# Patient Record
Sex: Female | Born: 1935 | Race: White | Hispanic: No | Marital: Married | Smoking: Former smoker
Health system: Southern US, Community
[De-identification: ages and names within clinical notes are randomized; demographics above are authoritative.]

## PROBLEM LIST (undated history)

## (undated) DIAGNOSIS — I2781 Cor pulmonale (chronic): Secondary | ICD-10-CM

## (undated) DIAGNOSIS — L97909 Non-pressure chronic ulcer of unspecified part of unspecified lower leg with unspecified severity: Secondary | ICD-10-CM

## (undated) DIAGNOSIS — I10 Essential (primary) hypertension: Secondary | ICD-10-CM

## (undated) DIAGNOSIS — N183 Chronic kidney disease, stage 3 unspecified: Secondary | ICD-10-CM

## (undated) DIAGNOSIS — I482 Chronic atrial fibrillation, unspecified: Secondary | ICD-10-CM

## (undated) DIAGNOSIS — S329XXA Fracture of unspecified parts of lumbosacral spine and pelvis, initial encounter for closed fracture: Secondary | ICD-10-CM

## (undated) DIAGNOSIS — I4821 Permanent atrial fibrillation: Secondary | ICD-10-CM

## (undated) DIAGNOSIS — J9611 Chronic respiratory failure with hypoxia: Secondary | ICD-10-CM

## (undated) DIAGNOSIS — J432 Centrilobular emphysema: Secondary | ICD-10-CM

## (undated) DIAGNOSIS — Z7901 Long term (current) use of anticoagulants: Secondary | ICD-10-CM

## (undated) DIAGNOSIS — C50919 Malignant neoplasm of unspecified site of unspecified female breast: Secondary | ICD-10-CM

## (undated) DIAGNOSIS — I2721 Secondary pulmonary arterial hypertension: Secondary | ICD-10-CM

## (undated) HISTORY — DX: Chronic kidney disease, stage 3 unspecified: N18.30

## (undated) HISTORY — DX: Essential (primary) hypertension: I10

## (undated) HISTORY — PX: ABDOMINAL HYSTERECTOMY: SHX81

## (undated) HISTORY — DX: Long term (current) use of anticoagulants: Z79.01

## (undated) HISTORY — DX: Malignant neoplasm of unspecified site of unspecified female breast: C50.919

## (undated) HISTORY — DX: Fracture of unspecified parts of lumbosacral spine and pelvis, initial encounter for closed fracture: S32.9XXA

## (undated) HISTORY — DX: Chronic atrial fibrillation, unspecified: I48.20

## (undated) HISTORY — PX: BREAST LUMPECTOMY: SHX2

---
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
## 1998-03-25 ENCOUNTER — Other Ambulatory Visit: Admission: RE | Admit: 1998-03-25 | Discharge: 1998-03-25 | Payer: Self-pay | Admitting: Family Medicine

## 1998-03-29 ENCOUNTER — Other Ambulatory Visit: Admission: RE | Admit: 1998-03-29 | Discharge: 1998-03-29 | Payer: Self-pay | Admitting: *Deleted

## 1998-04-09 ENCOUNTER — Encounter: Payer: Self-pay | Admitting: *Deleted

## 1998-04-10 ENCOUNTER — Inpatient Hospital Stay (HOSPITAL_COMMUNITY): Admission: RE | Admit: 1998-04-10 | Discharge: 1998-04-13 | Payer: Self-pay | Admitting: *Deleted

## 1998-09-17 ENCOUNTER — Other Ambulatory Visit: Admission: RE | Admit: 1998-09-17 | Discharge: 1998-09-17 | Payer: Self-pay | Admitting: Family Medicine

## 1999-09-10 ENCOUNTER — Encounter: Admission: RE | Admit: 1999-09-10 | Discharge: 1999-09-10 | Payer: Self-pay | Admitting: Family Medicine

## 1999-09-10 ENCOUNTER — Encounter: Payer: Self-pay | Admitting: Family Medicine

## 1999-12-15 ENCOUNTER — Other Ambulatory Visit: Admission: RE | Admit: 1999-12-15 | Discharge: 1999-12-15 | Payer: Self-pay | Admitting: *Deleted

## 2000-09-14 ENCOUNTER — Encounter: Payer: Self-pay | Admitting: Family Medicine

## 2000-09-14 ENCOUNTER — Encounter: Admission: RE | Admit: 2000-09-14 | Discharge: 2000-09-14 | Payer: Self-pay | Admitting: Family Medicine

## 2001-09-15 ENCOUNTER — Other Ambulatory Visit: Admission: RE | Admit: 2001-09-15 | Discharge: 2001-09-15 | Payer: Self-pay | Admitting: Obstetrics and Gynecology

## 2001-11-24 ENCOUNTER — Encounter: Admission: RE | Admit: 2001-11-24 | Discharge: 2001-11-24 | Payer: Self-pay | Admitting: Internal Medicine

## 2001-11-24 ENCOUNTER — Encounter: Payer: Self-pay | Admitting: Internal Medicine

## 2002-11-08 ENCOUNTER — Encounter: Payer: Self-pay | Admitting: Internal Medicine

## 2002-11-08 ENCOUNTER — Encounter: Admission: RE | Admit: 2002-11-08 | Discharge: 2002-11-08 | Payer: Self-pay | Admitting: Internal Medicine

## 2002-11-10 ENCOUNTER — Encounter: Admission: RE | Admit: 2002-11-10 | Discharge: 2002-11-10 | Payer: Self-pay | Admitting: Internal Medicine

## 2002-11-10 ENCOUNTER — Encounter (INDEPENDENT_AMBULATORY_CARE_PROVIDER_SITE_OTHER): Payer: Self-pay | Admitting: *Deleted

## 2002-11-10 ENCOUNTER — Encounter: Payer: Self-pay | Admitting: Internal Medicine

## 2002-11-16 ENCOUNTER — Encounter (HOSPITAL_COMMUNITY): Admission: RE | Admit: 2002-11-16 | Discharge: 2003-02-14 | Payer: Self-pay | Admitting: Surgery

## 2002-11-16 ENCOUNTER — Encounter: Payer: Self-pay | Admitting: Surgery

## 2002-11-17 ENCOUNTER — Encounter: Payer: Self-pay | Admitting: Surgery

## 2002-11-27 ENCOUNTER — Encounter: Admission: RE | Admit: 2002-11-27 | Discharge: 2002-11-27 | Payer: Self-pay | Admitting: Surgery

## 2002-11-27 ENCOUNTER — Encounter: Payer: Self-pay | Admitting: Surgery

## 2002-11-29 ENCOUNTER — Encounter: Admission: RE | Admit: 2002-11-29 | Discharge: 2002-11-29 | Payer: Self-pay | Admitting: Surgery

## 2002-11-29 ENCOUNTER — Ambulatory Visit (HOSPITAL_COMMUNITY): Admission: RE | Admit: 2002-11-29 | Discharge: 2002-11-29 | Payer: Self-pay | Admitting: Surgery

## 2002-11-29 ENCOUNTER — Encounter: Payer: Self-pay | Admitting: Surgery

## 2002-11-29 ENCOUNTER — Encounter (INDEPENDENT_AMBULATORY_CARE_PROVIDER_SITE_OTHER): Payer: Self-pay | Admitting: Specialist

## 2002-11-29 ENCOUNTER — Ambulatory Visit (HOSPITAL_BASED_OUTPATIENT_CLINIC_OR_DEPARTMENT_OTHER): Admission: RE | Admit: 2002-11-29 | Discharge: 2002-11-29 | Payer: Self-pay | Admitting: Surgery

## 2002-12-15 ENCOUNTER — Ambulatory Visit: Admission: RE | Admit: 2002-12-15 | Discharge: 2003-02-22 | Payer: Self-pay | Admitting: Radiation Oncology

## 2003-03-22 ENCOUNTER — Ambulatory Visit: Admission: RE | Admit: 2003-03-22 | Discharge: 2003-03-22 | Payer: Self-pay | Admitting: Radiation Oncology

## 2003-08-22 ENCOUNTER — Encounter: Admission: RE | Admit: 2003-08-22 | Discharge: 2003-08-22 | Payer: Self-pay | Admitting: Oncology

## 2004-01-02 ENCOUNTER — Ambulatory Visit: Payer: Self-pay | Admitting: Oncology

## 2004-06-24 ENCOUNTER — Ambulatory Visit: Payer: Self-pay | Admitting: Oncology

## 2004-08-25 ENCOUNTER — Encounter: Admission: RE | Admit: 2004-08-25 | Discharge: 2004-08-25 | Payer: Self-pay | Admitting: Oncology

## 2004-12-30 ENCOUNTER — Ambulatory Visit: Payer: Self-pay | Admitting: Oncology

## 2005-05-08 ENCOUNTER — Inpatient Hospital Stay (HOSPITAL_COMMUNITY): Admission: AD | Admit: 2005-05-08 | Discharge: 2005-05-12 | Payer: Self-pay | Admitting: Internal Medicine

## 2005-05-12 ENCOUNTER — Encounter (INDEPENDENT_AMBULATORY_CARE_PROVIDER_SITE_OTHER): Payer: Self-pay | Admitting: Interventional Cardiology

## 2005-08-26 ENCOUNTER — Encounter: Admission: RE | Admit: 2005-08-26 | Discharge: 2005-08-26 | Payer: Self-pay | Admitting: Oncology

## 2005-12-31 ENCOUNTER — Ambulatory Visit: Payer: Self-pay | Admitting: Oncology

## 2006-01-04 LAB — CBC WITH DIFFERENTIAL/PLATELET
BASO%: 2.1 % — ABNORMAL HIGH (ref 0.0–2.0)
Basophils Absolute: 0.1 10*3/uL (ref 0.0–0.1)
HGB: 13.6 g/dL (ref 11.6–15.9)
LYMPH%: 30.7 % (ref 14.0–48.0)
MCH: 34.9 pg — ABNORMAL HIGH (ref 26.0–34.0)
NEUT#: 3.7 10*3/uL (ref 1.5–6.5)
NEUT%: 55 % (ref 39.6–76.8)
lymph#: 2.1 10*3/uL (ref 0.9–3.3)

## 2006-01-04 LAB — COMPREHENSIVE METABOLIC PANEL
ALT: 16 U/L (ref 0–35)
AST: 18 U/L (ref 0–37)
Alkaline Phosphatase: 45 U/L (ref 39–117)
Chloride: 96 mEq/L (ref 96–112)
Creatinine, Ser: 0.74 mg/dL (ref 0.40–1.20)
Sodium: 136 mEq/L (ref 135–145)

## 2006-01-04 LAB — LACTATE DEHYDROGENASE: LDH: 129 U/L (ref 94–250)

## 2006-01-27 ENCOUNTER — Encounter: Admission: RE | Admit: 2006-01-27 | Discharge: 2006-01-27 | Payer: Self-pay | Admitting: Oncology

## 2006-03-09 ENCOUNTER — Encounter: Admission: RE | Admit: 2006-03-09 | Discharge: 2006-03-09 | Payer: Self-pay | Admitting: Oncology

## 2006-07-08 ENCOUNTER — Ambulatory Visit: Payer: Self-pay | Admitting: Oncology

## 2006-07-12 LAB — CBC WITH DIFFERENTIAL/PLATELET
BASO%: 0.7 % (ref 0.0–2.0)
Basophils Absolute: 0.1 10*3/uL (ref 0.0–0.1)
Eosinophils Absolute: 0.2 10*3/uL (ref 0.0–0.5)
HCT: 39.3 % (ref 34.8–46.6)
LYMPH%: 17.3 % (ref 14.0–48.0)
MCHC: 35.6 g/dL (ref 32.0–36.0)
MONO#: 0.8 10*3/uL (ref 0.1–0.9)
MONO%: 9.6 % (ref 0.0–13.0)
NEUT%: 69.9 % (ref 39.6–76.8)
RBC: 3.97 10*6/uL (ref 3.70–5.32)
RDW: 12.8 % (ref 11.3–14.5)
WBC: 8.9 10*3/uL (ref 3.9–10.0)

## 2006-07-12 LAB — COMPREHENSIVE METABOLIC PANEL
AST: 18 U/L (ref 0–37)
Albumin: 4.1 g/dL (ref 3.5–5.2)
Alkaline Phosphatase: 68 U/L (ref 39–117)
BUN: 12 mg/dL (ref 6–23)
Potassium: 3.8 mEq/L (ref 3.5–5.3)

## 2006-07-19 ENCOUNTER — Encounter: Admission: RE | Admit: 2006-07-19 | Discharge: 2006-07-19 | Payer: Self-pay | Admitting: Oncology

## 2007-01-31 ENCOUNTER — Encounter: Admission: RE | Admit: 2007-01-31 | Discharge: 2007-01-31 | Payer: Self-pay | Admitting: Oncology

## 2007-06-29 ENCOUNTER — Ambulatory Visit: Payer: Self-pay | Admitting: Oncology

## 2007-07-01 LAB — CBC WITH DIFFERENTIAL/PLATELET
BASO%: 0.9 % (ref 0.0–2.0)
Basophils Absolute: 0.1 10*3/uL (ref 0.0–0.1)
Eosinophils Absolute: 0.1 10*3/uL (ref 0.0–0.5)
HCT: 39.1 % (ref 34.8–46.6)
LYMPH%: 31.6 % (ref 14.0–48.0)
MONO#: 0.5 10*3/uL (ref 0.1–0.9)
NEUT%: 58.7 % (ref 39.6–76.8)
Platelets: 309 10*3/uL (ref 145–400)
WBC: 6.9 10*3/uL (ref 3.9–10.0)

## 2007-07-01 LAB — COMPREHENSIVE METABOLIC PANEL
AST: 20 U/L (ref 0–37)
Alkaline Phosphatase: 54 U/L (ref 39–117)
BUN: 14 mg/dL (ref 6–23)
Calcium: 9.2 mg/dL (ref 8.4–10.5)
Creatinine, Ser: 0.64 mg/dL (ref 0.40–1.20)
Glucose, Bld: 98 mg/dL (ref 70–99)
Potassium: 3.7 mEq/L (ref 3.5–5.3)

## 2008-02-01 ENCOUNTER — Encounter: Admission: RE | Admit: 2008-02-01 | Discharge: 2008-02-01 | Payer: Self-pay | Admitting: Oncology

## 2008-06-29 ENCOUNTER — Ambulatory Visit: Payer: Self-pay | Admitting: Oncology

## 2009-01-31 ENCOUNTER — Encounter: Admission: RE | Admit: 2009-01-31 | Discharge: 2009-01-31 | Payer: Self-pay | Admitting: Oncology

## 2009-06-28 ENCOUNTER — Ambulatory Visit: Payer: Self-pay | Admitting: Oncology

## 2009-07-01 LAB — CBC WITH DIFFERENTIAL/PLATELET
EOS%: 1.8 % (ref 0.0–7.0)
HCT: 41.9 % (ref 34.8–46.6)
HGB: 14.6 g/dL (ref 11.6–15.9)
MCHC: 34.8 g/dL (ref 31.5–36.0)
MCV: 101.7 fL — ABNORMAL HIGH (ref 79.5–101.0)
MONO#: 0.7 10*3/uL (ref 0.1–0.9)
MONO%: 8.9 % (ref 0.0–14.0)
NEUT%: 65.4 % (ref 38.4–76.8)
Platelets: 337 10*3/uL (ref 145–400)
RBC: 4.12 10*6/uL (ref 3.70–5.45)
RDW: 13.3 % (ref 11.2–14.5)
WBC: 7.9 10*3/uL (ref 3.9–10.3)

## 2009-07-01 LAB — COMPREHENSIVE METABOLIC PANEL
ALT: 11 U/L (ref 0–35)
Albumin: 4.1 g/dL (ref 3.5–5.2)
Potassium: 3.3 mEq/L — ABNORMAL LOW (ref 3.5–5.3)
Sodium: 134 mEq/L — ABNORMAL LOW (ref 135–145)
Total Protein: 7 g/dL (ref 6.0–8.3)

## 2009-07-01 LAB — LACTATE DEHYDROGENASE: LDH: 122 U/L (ref 94–250)

## 2010-02-03 ENCOUNTER — Encounter: Admission: RE | Admit: 2010-02-03 | Discharge: 2010-02-03 | Payer: Self-pay | Admitting: Oncology

## 2010-07-18 NOTE — Op Note (Signed)
[name], [name]                        ACCOUNT NO.:  [id number]   MEDICAL RECORD NO.:  [id number]                   PATIENT TYPE:  AMB   LOCATION:  DSC                                  FACILITY:  [facility]   PHYSICIAN:  Haywood Lasso, M.D.           DATE OF BIRTH:  Nov 08, 1924   DATE OF PROCEDURE:  11/29/2002  DATE OF DISCHARGE:                                 OPERATIVE REPORT   OFFICE MEDICAL RECORD NUMBER:  [id number]   PREOPERATIVE DIAGNOSIS:  Carcinoma left breast, 9 o'clock position.   POSTOPERATIVE DIAGNOSIS:  Carcinoma left breast, 9 o'clock position.   PROCEDURE:  Left partial mastectomy with blue dye injection and sentinel  node dissection.   SURGEON:  Haywood Lasso, M.D.   ANESTHESIA:  General.   INDICATIONS FOR PROCEDURE:  This patient is a 77 year old who recently  presented with a left breast mass.  On core biopsy showed invasive cancer.  It is about the 9 o'clock position.  Difficult to palpate for certain.  When  I saw her in the office to discuss her recent core biopsy. After discussion  of alternatives, she elected to proceed to partial mastectomy with sentinel  node.   DESCRIPTION OF PROCEDURE:  The patient was seen in the holding area and had  no further questions. She had an ultrasound to mark the area in question.  She was taken to the operating room and after satisfactory general  anesthesia had been obtained, I prepped the nipple areolar area with some  alcohol and injected about 4 mL of dilute methylene blue.  The breast was  then prepped and draped.   I approached the partial mastectomy first.  I made an elliptical incision  over where the mass was marked and I felt a little bit low below that, but  almost exactly in the 9 o'clock position, perhaps just a little bit above  the midline, pushing it into the upper inner quadrant.  I did an elliptical  incision around the mass, all the way down to the chest wall including the  fascia.  On  palpating the specimen, I thought I was close on the inferior  edge, somewhat laterally and went ahead and took additional tissue from  there ensuring a negative margin.  This was sent for touch preps.  The area  was draped with Marcaine.  Bleeding was controlled with cautery. Clips were  placed to make the margins of excision and a pack placed.   Attention was turned to the axilla.  Using the Neoprobe, I identified a hot  area and made a transverse incision.  I divided the subcutaneous tissues and  using the Neoprobe, I dissected a little higher up and then identified a  very bright blue node which was somewhat enlarged, perhaps 1.5 cm, but soft.  This was grasped with a Babcock and excised with cautery. A couple of  bleeders were clipped.  Additional node right  next to it was also taken out  which was likewise hot and then a third node was found just beyond these in  the chain that was likewise hot.  Neither of the latter two were blue.   Maximum counts on the first node was 3000.  Once these were out, I checked  and did not see another blue nodes.  I could not palpate any enlarged nodes  and found no hot areas.  Marcaine was injected here and a pack placed.   Attention was turned back to the lumpectomy, to the partial mastectomy site.  It had remained completely dry during the period that we had been working  the axilla, so I went ahead and closed this with 3-0 Vicryl subcu and  staples on the skin. There was a little tension, so I thought that staples  would be better than some absorbable suture.   Attention was turned back to the axilla and this had remained dry.  It was  likewise closed with 3-0 Vicryl and here I used some Monocryl subcuticular  and Dermabond.   The patient tolerated the procedure well.  There were no operative  complications.  Dr. Venora Maples reported that the touch preps on the primary  excision were negative and the three sentinel nodes were negative.                                                Haywood Lasso, M.D.    CJS/MEDQ  D:  11/29/2002  T:  11/29/2002  Job:  [id number]   cc:   [name], M.D.  [address]  [place]  [place] [address]  Fax: [phone]

## 2010-07-18 NOTE — Cardiovascular Report (Signed)
[name], [name] NO.:  [id number]   MEDICAL RECORD NO.:  [id number]          PATIENT TYPE:  INP   LOCATION:  2030                         FACILITY:  [facility]   PHYSICIAN:  Belva Crome, M.D.   DATE OF BIRTH:  06/06/1924   DATE OF PROCEDURE:  05/11/2005  DATE OF DISCHARGE:                              CARDIAC CATHETERIZATION   INDICATIONS FOR PROCEDURE:  Symptoms compatible with progressive angina  pectoris in this 77 year old female with prior history of luminal  irregularities in the LAD.   PROCEDURES PERFORMED:  1.  Left heart catheterization.  2.  Selective coronary angiography.  3.  Left ventriculography.   DESCRIPTION:  After informed consent, a 6-French sheath was placed in the  right femoral artery using the modified Seldinger technique. We then used A2  multipurpose catheter for hemodynamic recordings, left ventriculography by  hand injection, selective right coronary angiography and a #4 6-French left  coronary catheter was used for left coronary angiography. The patient  tolerated the procedure without complications.   After reviewing the pictures in the laboratory and speaking with the  patient, we did percutaneous arteriotomy closure using AngioSeal without  complications. Angiographic visualization of the femoral prior to the  procedure was performed and demonstrated nice smooth endothelium and entry  site appropriate for such procedure. Good hemostasis was achieved. No  complications were noted.   RESULTS:  1.  Hemodynamic data:      1.  Left ventricular pressure 141/9.      2.  Aortic pressure 134/65.  2.  Left ventriculography: LV cavity size and systolic function are normal.      No mitral regurgitation is noted. EF is estimated to be greater than      60%.  3.  Coronary angiography:      1.  Left main coronary: Normal.      2.  Left anterior descending coronary: The LAD is a large vessel that          gives origin to a large  diagonal. The diagonal contains a 60-75%          ostial narrowing. The diagonal is large. The LAD reaches the left          ventricular apex. The LAD is free of any significant obstruction.          The ostial lesion in the diagonal appears to somewhat be related to          a bend in the vessel in the angle of origin. Minimal luminal          irregularities are noted in the LAD in this territory.      3.  Circumflex artery: Circumflex artery is large giving origin to two          obtuse marginals that are normal.      4.  Right coronary: The right coronary artery is dominant. The vessel          contains smooth endothelial surfaces. No obstructive lesions are          noted. Large PDA and large left ventricular  branch noted.   CONCLUSION:  1.  Moderate disease involving the first diagonal with 50-70% narrowing at      the ostium. No other noticeable atherosclerotic disease is present.  2.  Normal left ventricular function.  3.  Successful AngioSeal arteriotomy closure and demonstration of smooth      nonobstructive right femoral/iliac.   PLAN:  1.  Risk factor modification including aggressive lipid management.  2.  Used nitroglycerin for recurrent chest discomfort to see if this is of      help.  3.  Consider noncardiac sources of pain.  4.  Could do PCI on the diagonal with perhaps cutting balloon angioplasty.      This lesion would not be appropriate for stenting as this may enter the      LAD and put it at risk. Would only consider PCI on the diagonal if we      could demonstrate ischemia in the diagonal territory.  5.  Evaluate and treat paroxysmal episodes of atrial fibrillation.      Belva Crome, M.D.  Electronically Signed     HWS/MEDQ  D:  05/11/2005  T:  05/11/2005  Job:  [id number]   cc:   [name], MD  Fax: [phone]   W. Tollie Eth, M.D.  Fax: [phone]  Email: [email or web address]

## 2010-07-18 NOTE — Consult Note (Signed)
NAMEDONA, WALBY NO.:  [id number]   MEDICAL RECORD NO.:  [id number]          PATIENT TYPE:  INP   LOCATION:  2030                         FACILITY:  [facility]   PHYSICIAN:  Ezzard Standing, M.D.DATE OF BIRTH:  11/19/1924   DATE OF CONSULTATION:  05/08/2005  DATE OF DISCHARGE:                                   CONSULTATION   I was asked to see this very nice 77 year old female by Dr. Virgina Jock for  evaluation of prolonged chest discomfort.  The patient previously was  evaluated with chest pain and severe hypertension in December 1995 by the  [facility] group.  At that time, she had a dominant right coronary artery with  minimal irregularity of the proximal ostial LAD and first diagonal.  She had  some possible myocardial bridging in the mid third of her LAD at that time.  She had normal renal arteries at that time.  She had a left bundle branch  block and a negative thallium test at that point and was treated medically.  She later switched care to Dr. Tamala Julian and it has been several years since she  has seen him.  She normally is active and healthy with good blood pressure  control but over the past six months, has had exertional tightness and  heaviness with exertion.  The tightness would occur when she would be on her  walk and would be relieved with rest.  It typically would not occur at rest.  She was at work today at the [employer] and had the onset of throat  burning, upper discomfort in her chest accompanied with some shortness of  breath that lasted around 30 minutes.  She felt light headed, weak, and  pressure at that time and later presented to Dr. Keane Police office.  The  symptoms had resolved and she had a left bundle branch block noted at that  time.  After consultation, she was admitted to the hospital to evaluate for  unstable angina and to plan further therapy.   PAST MEDICAL HISTORY:  Her past history is remarkable for a history of  anxiety and  long-standing hypertension.  She has osteopenia, diverticulosis,  and a previous history of squamous cell carcinoma.  She has had previous  breast cancer treated with lumpectomy and radiation therapy.   PAST SURGICAL HISTORY:  She had a sympathectomy of her ovarian nerves for  uterine cramps at age 75.  She has a previous history of hysterectomy for  uterine cancer and a breast lumpectomy and several operations for skin  cancer.   ALLERGIES:  Codeine.   CURRENT MEDICATIONS:  Meprobamate 400 mg q.h.s., Toprol XL 100 mg daily, and  Micardis/HCT 80/12.5 mg b.i.d., aspirin 81 mg daily, Norvasc 10 mg daily,  vitamin D 400 mg b.i.d., vitamin E 400 mg daily, calcium t.i.d.   SOCIAL HISTORY:  She is married, she has two children living and a son that  died at age 37.  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  She  smoked transiently when she was in college and in her early years, but  has  not smoked in many years.  No significant alcohol use.  Attends [employer].   FAMILY HISTORY:  Remarkable for pancreatic cancer and a history of heart  failure.  Mother had heart disease at greater than age 9.   REVIEW OF SYMPTOMS:  Her weight has been stable.  She has a history of  squamous cell cancers.  She has no glaucoma, cataracts, or macular  degeneration, no nosebleeds or difficulty hearing, no difficulty swallowing  or trouble with her mouth, nose, ears, or throat.  She normally has no  dyspnea on exertion, recent cough, or asthma.  She may have noted some mild  palpitations earlier today.  She complains of some mild lower abdominal  discomfort from time to time.  No genitourinary symptoms.  No history of  kidney stones, no history of stroke or TIA, very mild arthritis.   PHYSICAL EXAMINATION:  GENERAL:  She is a pleasant female in no acute distress appearing stated  age.  VITAL SIGNS:  Blood pressure currently 140/85, pulse 96.  SKIN:  Warm and dry.  HEENT:  EOMI,  PERRLA, fundi not examined, pharynx negative.  NECK:  Supple without masses, thyromegaly, or bruits.  LUNGS:  Clear.  CARDIOVASCULAR:  Shows a somewhat rapid rhythm, no S3 noted.  ABDOMEN:  Soft and nontender.  EXTREMITIES:  Femoral pulses are 2+, distal pulses are 2+, there is no edema  noted.   LABORATORY DATA:  EKG shows a left bundle branch block, it also has the  appearance that it may represent atrial tachycardia or atrial flutter on  initial appearance.  Laboratory work is all pending at the time of  dictation.   IMPRESSION:  1.  Prolonged chest discomfort lasting 30 minutes occurring at rest      consistent with myocardial ischemia.  2.  Left bundle branch block.  3.  Possible atrial arrhythmia consistent with atrial flutter or      tachycardia.  4.  Hypertensive heart disease.  5.  History of breast cancer treated with lumpectomy and radiation therapy.  6.  Diverticulosis.  7.  Left bundle branch block.   RECOMMENDATIONS:  The patient will be placed on Lovenox.  I would increase  her beta blockers and check serial enzymes and lab work on her.  For current  pain, begin IV nitroglycerin and consider Integrilin, continue aspirin.  At  this point, I am concerned that there might be an atrial arrhythmia, also,  and will obtain serial EKGs and watch her on telemetry.  I think to evaluate  her, it would be best to proceed directly to cardiac catheterization to  assess her coronary anatomy and we will arrange for Dr. Tamala Julian to do this on  Monday.   Thank you for asking me to see her with you.      Ezzard Standing, M.D.  Electronically Signed     WST/MEDQ  D:  05/08/2005  T:  05/08/2005  Job:  [id number]   cc:   Belva Crome, M.D.  Fax: [phone]   [name], MD  Fax: [phone]

## 2010-07-18 NOTE — Discharge Summary (Signed)
NAMEDALYA, MASELLI NO.:  [id number]   MEDICAL RECORD NO.:  [id number]          PATIENT TYPE:  INP   LOCATION:  2030                         FACILITY:  [facility]   PHYSICIAN:  [name], MD       DATE OF BIRTH:  07/12/1924   DATE OF ADMISSION:  05/08/2005  DATE OF DISCHARGE:  05/12/2005                                 DISCHARGE SUMMARY   PRIMARY CARE PHYSICIAN:  Dr. Shon Baton   CARDIOLOGIST:  Dr. Pernell Dupre   DISCHARGE DIAGNOSES:  1.  One vessel coronary artery disease with cardiac catheterization on May 11, 2005 revealing ejection fraction 60%, first diagonal with a 65-70%      narrowing.  2.  Anxiety.  3.  History of uterine cancer status post total abdominal hysterectomy.  4.  Osteopenia.  5.  Known left bundle branch block.  6.  Breast cancer status post lumpectomy.  7.  Sympathectomy at 77 years old.  8.  Diverticulosis.  9.  Basal cell carcinoma of the right leg.  10. History of squamous cell carcinoma.  11. Paroxysmal atrial flutter.   DISCHARGE PROCEDURES:  1.  Cardiac catheterization with one vessel coronary artery disease noted.      Ejection fraction 60%.  She had a 50-70% first diagonal lesion.  2.  Chest x-ray without any acute cardiopulmonary disease.  3.  Echocardiogram on March 13 revealing left ventricular ejection fraction      was 65%, no evidence of left ventricular regional wall motion      abnormalities and left atrial size was normal.  4.  Lower extremity ankle brachial indexes which were normal.  5.  EKG which was normal sinus rhythm, left axis deviation, left bundle      branch block.   DISCHARGE MEDICATIONS:  1.  Aspirin 325 mg p.o. daily.  2.  Toprol XL 100 mg p.o. daily.  3.  Norvasc 10 mg p.o. daily.  4.  Micardis/HCT 80/12.5 mg p.o. b.i.d.  5.  Calcium and vitamin D two to three times per day.  6.  Meprobamate 400 mg p.o. q.h.s.  7.  Nitroglycerin 0.4 mg sublingual p.r.n. for chest pain.  8.  K-Dur 20 mEq  p.o. daily.   HISTORY OF PRESENT ILLNESS:  Briefly, Ms. Salem is a 77 year old female who  presented to medical attention at my office on May 08, 2005 with a known  history of 30% LAD lesion from a 1995 heart catheterization, hypertension,  known left bundle branch block who had symptoms of what sounds like  progressive angina.  She describes six months of tightness in the chest that  develops when walking or exerting herself.  It has gradually worsened over  time.  She gets cramps in her legs where resting helps.  Today at rest she  developed chest pressure.  It lasted 30 minutes.  It was associated with  lightheadedness, weakness.  It was not associated with shortness of breath,  radiation, or diaphoresis.  When she presented to my office she was chest  pain-free.  Her symptoms sound like progressing  angina and the March 10  symptoms were very compatible with angina.  A long discussion was had with  Ms. Makela and we will admit her to the hospital, follow serial enzymes, and  progress to cardiac catheterization.  She will probably have to wait until  Monday to get the cardiac catheterization unless her symptoms return or  enzymes are positive.   HOSPITAL COURSE:  Ms. Camisha Srey was with known one-vessel non-  obstructive coronary artery disease.  Was admitted through my office on  May 08, 2005 with symptoms compatible with angina.  Serial enzymes were  obtained and all negative for acute myocardial infarction.  She had an EKG,  placed on telemetry, given aspirin, Lovenox, and the usual medications.  Cardiology was consulted.  Telemetry monitoring did show some atrial flutter  but she did bounce back to normal sinus rhythm.  On May 11, 2005 she  underwent cardiac catheterization with the report above.  Again, she had a  first diagonal lesion of about 60-79% and EF was 60% and it was determined  that she would just need risk factor modification, nitroglycerin for chest  pain,  and an echocardiogram to evaluate for left ventricular size and  function.  It was determined that she would only get percutaneous  intervention if she was having a heart attack or continued symptomatology.  In light of the telemetry revealing that she went in and out of the flutter  once, some of her symptoms might be off and on related to arrhythmia and not  angina.  This can and will be further evaluated by Dr. Tamala Julian as an  outpatient with [facility] or Holter monitoring.  Discussion of Coumadin  will need to occur as an outpatient.   As far as her leg pains are concerned lower extremity arterial Dopplers were  done and they were normal.   For the atrial flutter which reverted back to normal sinus rhythm, again, it  is going to be worked up further as an outpatient.  The 2-D echocardiogram  was otherwise unremarkable and she remained relatively rate controlled.  Heart rate is currently 63.   Her left bundle branch block is old and does not need anything done with it.   Blood pressure remained appropriate to slightly high and can be further  adjusted as an outpatient.   Her anxiety remained stable throughout the hospital course.   Her CBC remained normal.  Her BMET remained normal.  TSH was 1.753.  Total  cholesterol 132, triglycerides 125, HDL 60, LDL was only 47 and this did not  need to be further adjusted.  On May 12, 2005 it was determined that if  she was chest pain-free we were not going to intervene on that one vessel,  that we had a work-up planned per outpatient but we did want to get her  echocardiogram done.  That was done and was normal.  It was okay to  discharge her late in the day on May 12, 2005 with instructions to follow  up with Dr. Pernell Dupre in a day or two in his office to get a Holter monitor  and she will follow up with me in one to two weeks and follow up with Dr.  Tamala Julian as directed and Coumadin outpatient will be discussed.  At the time  of discharge she was in normal sinus rhythm.     [name], MD  Electronically Signed    JMR/MEDQ  D:  07/14/2005  T:  07/14/2005  Job:  [id number]

## 2010-07-18 NOTE — H&P (Signed)
[name], [name] NO.:  [id number]   MEDICAL RECORD NO.:  [id number]          PATIENT TYPE:  INP   LOCATION:  2030                         FACILITY:  [facility]   PHYSICIAN:  [name], MD       DATE OF BIRTH:  Apr 16, 1924   DATE OF ADMISSION:  05/08/2005  DATE OF DISCHARGE:                                HISTORY & PHYSICAL   PRIMARY CARE Anthony Tamburo:  Dr. Shon Baton   CARDIOLOGIST:  Dr. Pernell Dupre   CHIEF COMPLAINT:  Chest pain and angina.   HISTORY OF PRESENT ILLNESS:  This is a 77 year old female with known 30% LAD  lesion from 1995 heart catheterization, hypertension, and known left bundle-  branch block, presents with progressive angina. She describes a 45-month history of tightness in the chest that develops with walking and exerting  herself. It has gradually gotten worse over time and has been much worse  over the last couple of weeks. She also gets cramps in her legs. She reports  that resting helps. Today at rest, the chest pressure developed and lasted  30 minutes with lightheadedness, weakness, and pressure. There was no  shortness of breath, no radiation, no diaphoresis. She was searching for a  aspirin as she felt this might be her heart, was unable to get one. The  patient is now being seen in the office and she is currently chest-pain free  but she will be admitted for evaluation and treatment of angina and have a  cardiac catheterization, probably Monday unless symptoms return or enzymes  are positive.   ALLERGIES:  CODEINE.   MEDICATIONS:  1.  Meprobamate 400 q.h.s.  2.  Toprol-XL 100 daily.  3.  Micardis HCT 80/12.5 b.i.d.  4.  Aspirin 81 daily.  5.  Norvasc 10 daily.  6.  Vitamin D 400 b.i.d.  7.  Vitamin E 400 daily.  8.  Calcium 500 t.i.d.   PAST MEDICAL HISTORY:  1.  Cardiac catheterization in December 1995 with a 30% LAD lesion.  2.  Anxiety.  3.  History of uterine cancer status post total abdominal hysterectomy.  4.   Osteopenia.  5.  Left bundle-branch block.  6.  Breast cancer status post lumpectomy.  7.  Sympathectomy at 77years old.  8.  Diverticulosis.  9.  Basal cell carcinoma of the right leg.  10. History of squamous cell carcinoma.   SOCIAL HISTORY:  She is married, three children. She is retired. She drinks  wine.   FAMILY HISTORY:  Longevity, pancreatic cancer, congestive heart failure.   REVIEW OF SYSTEMS:  She denies any abdominal or GI issues. She denies any  ear, nose, and throat symptoms. She denies any other issues except the  angina as described in the history of present illness. She is currently  chest-pain free and comfortable in the office.   PHYSICAL EXAMINATION:  VITAL SIGNS:  Heart rate 100, respiratory rate 18,  blood pressure 138/80, weight is 145 pounds.  GENERAL:  She is alert and oriented x3.  PULMONARY:  Clear to auscultation bilaterally.  CARDIAC:  Regular.  ABDOMEN:  Soft.  EXTREMITIES:  No clubbing, no cyanosis, no edema.  HEENT:  Oropharynx is moist.  NECK:  No JVD, no lymphadenopathy.   ANCILLARY DATA:  EKG:  Left bundle-branch block on EKG.   ASSESSMENT:  This is a 77 year old female with history of hypertension and  left bundle-branch block and a 30% LAD lesion from a cardiac catheterization  in 1995 who is presenting with worsening angina and had angina at rest  today. She is currently chest-pain free.   PLAN:  1.  Admit.  2.  Lovenox 1 mg/kg subcu b.i.d.  3.  Follow serial enzymes.  4.  Nitroglycerin as needed. Since she is not currently having chest pain      will not do nitroglycerin drip.  5.  Cardiology to see the patient tonight. Will get Dr. Wynonia Lawman involved. I      have already talked to him on the phone. He is on call for Dr. Tamala Julian.  6.  Place her on a telemetry bed.  7.  Control blood pressure.  8.  With my discussion with Dr. Wynonia Lawman it looks like she will be probably      progressing towards a heart catheterization for Monday unless she  does      rule in for myocardial infarction or develops unstable angina this      weekend.  9.  Lorazepam for nerves.  10. Will discontinue the vitamin E.  11. Continue home medications.      [name], MD  Electronically Signed     JMR/MEDQ  D:  05/08/2005  T:  05/09/2005  Job:  [id number]   cc:   Belva Crome, M.D.  Fax: [phone]

## 2011-01-02 ENCOUNTER — Other Ambulatory Visit: Payer: Self-pay | Admitting: Internal Medicine

## 2011-01-02 DIAGNOSIS — Z1231 Encounter for screening mammogram for malignant neoplasm of breast: Secondary | ICD-10-CM

## 2011-01-13 ENCOUNTER — Other Ambulatory Visit: Payer: Self-pay | Admitting: Family Medicine

## 2011-02-05 ENCOUNTER — Ambulatory Visit
Admission: RE | Admit: 2011-02-05 | Discharge: 2011-02-05 | Disposition: A | Discharge status: Home / Self Care | Payer: Medicare Other | Source: Ambulatory Visit | Attending: Internal Medicine | Admitting: Internal Medicine

## 2011-02-05 DIAGNOSIS — Z1231 Encounter for screening mammogram for malignant neoplasm of breast: Secondary | ICD-10-CM

## 2011-03-17 ENCOUNTER — Other Ambulatory Visit: Payer: Self-pay

## 2011-03-17 DIAGNOSIS — H11009 Unspecified pterygium of unspecified eye: Secondary | ICD-10-CM | POA: Diagnosis not present

## 2011-03-17 DIAGNOSIS — H11069 Recurrent pterygium of unspecified eye: Secondary | ICD-10-CM | POA: Diagnosis not present

## 2011-03-25 DIAGNOSIS — M949 Disorder of cartilage, unspecified: Secondary | ICD-10-CM | POA: Diagnosis not present

## 2011-03-25 DIAGNOSIS — Z7901 Long term (current) use of anticoagulants: Secondary | ICD-10-CM | POA: Diagnosis not present

## 2011-03-25 DIAGNOSIS — I4891 Unspecified atrial fibrillation: Secondary | ICD-10-CM | POA: Diagnosis not present

## 2011-03-25 DIAGNOSIS — I10 Essential (primary) hypertension: Secondary | ICD-10-CM | POA: Diagnosis not present

## 2011-03-25 DIAGNOSIS — M899 Disorder of bone, unspecified: Secondary | ICD-10-CM | POA: Diagnosis not present

## 2011-03-25 DIAGNOSIS — R5381 Other malaise: Secondary | ICD-10-CM | POA: Diagnosis not present

## 2011-04-01 DIAGNOSIS — R5383 Other fatigue: Secondary | ICD-10-CM | POA: Diagnosis not present

## 2011-04-01 DIAGNOSIS — I10 Essential (primary) hypertension: Secondary | ICD-10-CM | POA: Diagnosis not present

## 2011-04-01 DIAGNOSIS — Z0000 Encounter for general adult medical examination without abnormal findings: Secondary | ICD-10-CM | POA: Diagnosis not present

## 2011-04-01 DIAGNOSIS — C50919 Malignant neoplasm of unspecified site of unspecified female breast: Secondary | ICD-10-CM | POA: Diagnosis not present

## 2011-04-01 DIAGNOSIS — R5381 Other malaise: Secondary | ICD-10-CM | POA: Diagnosis not present

## 2011-04-07 DIAGNOSIS — R82998 Other abnormal findings in urine: Secondary | ICD-10-CM | POA: Diagnosis not present

## 2011-05-05 DIAGNOSIS — Z7901 Long term (current) use of anticoagulants: Secondary | ICD-10-CM | POA: Diagnosis not present

## 2011-05-05 DIAGNOSIS — I4891 Unspecified atrial fibrillation: Secondary | ICD-10-CM | POA: Diagnosis not present

## 2011-05-08 DIAGNOSIS — Z85828 Personal history of other malignant neoplasm of skin: Secondary | ICD-10-CM | POA: Diagnosis not present

## 2011-05-08 DIAGNOSIS — B079 Viral wart, unspecified: Secondary | ICD-10-CM | POA: Diagnosis not present

## 2011-05-08 DIAGNOSIS — L578 Other skin changes due to chronic exposure to nonionizing radiation: Secondary | ICD-10-CM | POA: Diagnosis not present

## 2011-05-08 DIAGNOSIS — L570 Actinic keratosis: Secondary | ICD-10-CM | POA: Diagnosis not present

## 2011-06-03 DIAGNOSIS — M899 Disorder of bone, unspecified: Secondary | ICD-10-CM | POA: Diagnosis not present

## 2011-06-03 DIAGNOSIS — M949 Disorder of cartilage, unspecified: Secondary | ICD-10-CM | POA: Diagnosis not present

## 2011-06-03 DIAGNOSIS — Z7901 Long term (current) use of anticoagulants: Secondary | ICD-10-CM | POA: Diagnosis not present

## 2011-06-03 DIAGNOSIS — I4891 Unspecified atrial fibrillation: Secondary | ICD-10-CM | POA: Diagnosis not present

## 2011-06-17 DIAGNOSIS — H11009 Unspecified pterygium of unspecified eye: Secondary | ICD-10-CM | POA: Diagnosis not present

## 2011-06-17 DIAGNOSIS — H179 Unspecified corneal scar and opacity: Secondary | ICD-10-CM | POA: Diagnosis not present

## 2011-06-17 DIAGNOSIS — Z961 Presence of intraocular lens: Secondary | ICD-10-CM | POA: Diagnosis not present

## 2011-07-01 DIAGNOSIS — M899 Disorder of bone, unspecified: Secondary | ICD-10-CM | POA: Diagnosis not present

## 2011-07-01 DIAGNOSIS — Z7901 Long term (current) use of anticoagulants: Secondary | ICD-10-CM | POA: Diagnosis not present

## 2011-07-01 DIAGNOSIS — M949 Disorder of cartilage, unspecified: Secondary | ICD-10-CM | POA: Diagnosis not present

## 2011-07-01 DIAGNOSIS — I4891 Unspecified atrial fibrillation: Secondary | ICD-10-CM | POA: Diagnosis not present

## 2011-08-12 DIAGNOSIS — Z7901 Long term (current) use of anticoagulants: Secondary | ICD-10-CM | POA: Diagnosis not present

## 2011-08-12 DIAGNOSIS — I4891 Unspecified atrial fibrillation: Secondary | ICD-10-CM | POA: Diagnosis not present

## 2011-08-17 DIAGNOSIS — H11009 Unspecified pterygium of unspecified eye: Secondary | ICD-10-CM | POA: Diagnosis not present

## 2011-08-17 DIAGNOSIS — Z961 Presence of intraocular lens: Secondary | ICD-10-CM | POA: Diagnosis not present

## 2011-08-17 DIAGNOSIS — H179 Unspecified corneal scar and opacity: Secondary | ICD-10-CM | POA: Diagnosis not present

## 2011-09-14 DIAGNOSIS — Z7901 Long term (current) use of anticoagulants: Secondary | ICD-10-CM | POA: Diagnosis not present

## 2011-09-14 DIAGNOSIS — I4891 Unspecified atrial fibrillation: Secondary | ICD-10-CM | POA: Diagnosis not present

## 2011-09-16 DIAGNOSIS — I10 Essential (primary) hypertension: Secondary | ICD-10-CM | POA: Diagnosis not present

## 2011-09-16 DIAGNOSIS — R0602 Shortness of breath: Secondary | ICD-10-CM | POA: Diagnosis not present

## 2011-09-16 DIAGNOSIS — I4891 Unspecified atrial fibrillation: Secondary | ICD-10-CM | POA: Diagnosis not present

## 2011-09-16 DIAGNOSIS — I2510 Atherosclerotic heart disease of native coronary artery without angina pectoris: Secondary | ICD-10-CM | POA: Diagnosis not present

## 2011-09-16 DIAGNOSIS — R5382 Chronic fatigue, unspecified: Secondary | ICD-10-CM | POA: Diagnosis not present

## 2011-09-30 DIAGNOSIS — R809 Proteinuria, unspecified: Secondary | ICD-10-CM | POA: Diagnosis not present

## 2011-09-30 DIAGNOSIS — I4891 Unspecified atrial fibrillation: Secondary | ICD-10-CM | POA: Diagnosis not present

## 2011-09-30 DIAGNOSIS — R0602 Shortness of breath: Secondary | ICD-10-CM | POA: Diagnosis not present

## 2011-09-30 DIAGNOSIS — R0989 Other specified symptoms and signs involving the circulatory and respiratory systems: Secondary | ICD-10-CM | POA: Diagnosis not present

## 2011-09-30 DIAGNOSIS — Z79899 Other long term (current) drug therapy: Secondary | ICD-10-CM | POA: Diagnosis not present

## 2011-09-30 DIAGNOSIS — I2510 Atherosclerotic heart disease of native coronary artery without angina pectoris: Secondary | ICD-10-CM | POA: Diagnosis not present

## 2011-09-30 DIAGNOSIS — R0609 Other forms of dyspnea: Secondary | ICD-10-CM | POA: Diagnosis not present

## 2011-09-30 DIAGNOSIS — I10 Essential (primary) hypertension: Secondary | ICD-10-CM | POA: Diagnosis not present

## 2011-09-30 DIAGNOSIS — R5382 Chronic fatigue, unspecified: Secondary | ICD-10-CM | POA: Diagnosis not present

## 2011-09-30 DIAGNOSIS — Z7901 Long term (current) use of anticoagulants: Secondary | ICD-10-CM | POA: Diagnosis not present

## 2011-10-02 DIAGNOSIS — R809 Proteinuria, unspecified: Secondary | ICD-10-CM | POA: Diagnosis not present

## 2011-10-06 DIAGNOSIS — R809 Proteinuria, unspecified: Secondary | ICD-10-CM | POA: Diagnosis not present

## 2011-10-20 DIAGNOSIS — I4891 Unspecified atrial fibrillation: Secondary | ICD-10-CM | POA: Diagnosis not present

## 2011-10-20 DIAGNOSIS — Z7901 Long term (current) use of anticoagulants: Secondary | ICD-10-CM | POA: Diagnosis not present

## 2011-11-03 DIAGNOSIS — Z79899 Other long term (current) drug therapy: Secondary | ICD-10-CM | POA: Diagnosis not present

## 2011-11-17 DIAGNOSIS — D1801 Hemangioma of skin and subcutaneous tissue: Secondary | ICD-10-CM | POA: Diagnosis not present

## 2011-11-17 DIAGNOSIS — L570 Actinic keratosis: Secondary | ICD-10-CM | POA: Diagnosis not present

## 2011-11-17 DIAGNOSIS — L259 Unspecified contact dermatitis, unspecified cause: Secondary | ICD-10-CM | POA: Diagnosis not present

## 2011-11-17 DIAGNOSIS — L578 Other skin changes due to chronic exposure to nonionizing radiation: Secondary | ICD-10-CM | POA: Diagnosis not present

## 2011-11-17 DIAGNOSIS — L820 Inflamed seborrheic keratosis: Secondary | ICD-10-CM | POA: Diagnosis not present

## 2011-11-17 DIAGNOSIS — L219 Seborrheic dermatitis, unspecified: Secondary | ICD-10-CM | POA: Diagnosis not present

## 2011-11-24 DIAGNOSIS — Z23 Encounter for immunization: Secondary | ICD-10-CM | POA: Diagnosis not present

## 2011-11-24 DIAGNOSIS — I4891 Unspecified atrial fibrillation: Secondary | ICD-10-CM | POA: Diagnosis not present

## 2011-11-24 DIAGNOSIS — Z7901 Long term (current) use of anticoagulants: Secondary | ICD-10-CM | POA: Diagnosis not present

## 2011-12-08 DIAGNOSIS — H1130 Conjunctival hemorrhage, unspecified eye: Secondary | ICD-10-CM | POA: Diagnosis not present

## 2011-12-23 DIAGNOSIS — Z7901 Long term (current) use of anticoagulants: Secondary | ICD-10-CM | POA: Diagnosis not present

## 2011-12-23 DIAGNOSIS — I4891 Unspecified atrial fibrillation: Secondary | ICD-10-CM | POA: Diagnosis not present

## 2012-01-06 DIAGNOSIS — Z7901 Long term (current) use of anticoagulants: Secondary | ICD-10-CM | POA: Diagnosis not present

## 2012-01-06 DIAGNOSIS — I4891 Unspecified atrial fibrillation: Secondary | ICD-10-CM | POA: Diagnosis not present

## 2012-01-20 DIAGNOSIS — Z7901 Long term (current) use of anticoagulants: Secondary | ICD-10-CM | POA: Diagnosis not present

## 2012-01-20 DIAGNOSIS — I10 Essential (primary) hypertension: Secondary | ICD-10-CM | POA: Diagnosis not present

## 2012-01-20 DIAGNOSIS — I4891 Unspecified atrial fibrillation: Secondary | ICD-10-CM | POA: Diagnosis not present

## 2012-01-20 DIAGNOSIS — I2510 Atherosclerotic heart disease of native coronary artery without angina pectoris: Secondary | ICD-10-CM | POA: Diagnosis not present

## 2012-02-10 DIAGNOSIS — I4891 Unspecified atrial fibrillation: Secondary | ICD-10-CM | POA: Diagnosis not present

## 2012-02-10 DIAGNOSIS — Z7901 Long term (current) use of anticoagulants: Secondary | ICD-10-CM | POA: Diagnosis not present

## 2012-02-16 DIAGNOSIS — C44621 Squamous cell carcinoma of skin of unspecified upper limb, including shoulder: Secondary | ICD-10-CM | POA: Diagnosis not present

## 2012-02-16 DIAGNOSIS — D0470 Carcinoma in situ of skin of unspecified lower limb, including hip: Secondary | ICD-10-CM | POA: Diagnosis not present

## 2012-02-16 DIAGNOSIS — D485 Neoplasm of uncertain behavior of skin: Secondary | ICD-10-CM | POA: Diagnosis not present

## 2012-02-16 DIAGNOSIS — L578 Other skin changes due to chronic exposure to nonionizing radiation: Secondary | ICD-10-CM | POA: Diagnosis not present

## 2012-03-09 DIAGNOSIS — I4891 Unspecified atrial fibrillation: Secondary | ICD-10-CM | POA: Diagnosis not present

## 2012-03-09 DIAGNOSIS — Z7901 Long term (current) use of anticoagulants: Secondary | ICD-10-CM | POA: Diagnosis not present

## 2012-03-22 DIAGNOSIS — C4492 Squamous cell carcinoma of skin, unspecified: Secondary | ICD-10-CM | POA: Diagnosis not present

## 2012-03-28 DIAGNOSIS — L578 Other skin changes due to chronic exposure to nonionizing radiation: Secondary | ICD-10-CM | POA: Diagnosis not present

## 2012-03-28 DIAGNOSIS — L089 Local infection of the skin and subcutaneous tissue, unspecified: Secondary | ICD-10-CM | POA: Diagnosis not present

## 2012-03-28 DIAGNOSIS — L570 Actinic keratosis: Secondary | ICD-10-CM | POA: Diagnosis not present

## 2012-03-28 DIAGNOSIS — Z85828 Personal history of other malignant neoplasm of skin: Secondary | ICD-10-CM | POA: Diagnosis not present

## 2012-03-29 DIAGNOSIS — D539 Nutritional anemia, unspecified: Secondary | ICD-10-CM | POA: Diagnosis not present

## 2012-03-29 DIAGNOSIS — R82998 Other abnormal findings in urine: Secondary | ICD-10-CM | POA: Diagnosis not present

## 2012-03-29 DIAGNOSIS — I10 Essential (primary) hypertension: Secondary | ICD-10-CM | POA: Diagnosis not present

## 2012-03-29 DIAGNOSIS — I2510 Atherosclerotic heart disease of native coronary artery without angina pectoris: Secondary | ICD-10-CM | POA: Diagnosis not present

## 2012-03-29 DIAGNOSIS — M899 Disorder of bone, unspecified: Secondary | ICD-10-CM | POA: Diagnosis not present

## 2012-03-29 DIAGNOSIS — M949 Disorder of cartilage, unspecified: Secondary | ICD-10-CM | POA: Diagnosis not present

## 2012-04-04 DIAGNOSIS — R7309 Other abnormal glucose: Secondary | ICD-10-CM | POA: Diagnosis not present

## 2012-04-04 DIAGNOSIS — Z0000 Encounter for general adult medical examination without abnormal findings: Secondary | ICD-10-CM | POA: Diagnosis not present

## 2012-04-04 DIAGNOSIS — I2510 Atherosclerotic heart disease of native coronary artery without angina pectoris: Secondary | ICD-10-CM | POA: Diagnosis not present

## 2012-04-04 DIAGNOSIS — R0989 Other specified symptoms and signs involving the circulatory and respiratory systems: Secondary | ICD-10-CM | POA: Diagnosis not present

## 2012-04-06 ENCOUNTER — Other Ambulatory Visit: Payer: Self-pay | Admitting: Internal Medicine

## 2012-04-06 DIAGNOSIS — Z9889 Other specified postprocedural states: Secondary | ICD-10-CM

## 2012-04-06 DIAGNOSIS — Z853 Personal history of malignant neoplasm of breast: Secondary | ICD-10-CM

## 2012-04-06 DIAGNOSIS — Z1231 Encounter for screening mammogram for malignant neoplasm of breast: Secondary | ICD-10-CM

## 2012-04-12 DIAGNOSIS — Z7901 Long term (current) use of anticoagulants: Secondary | ICD-10-CM | POA: Diagnosis not present

## 2012-04-12 DIAGNOSIS — I4891 Unspecified atrial fibrillation: Secondary | ICD-10-CM | POA: Diagnosis not present

## 2012-04-15 ENCOUNTER — Ambulatory Visit: Payer: Medicare Other

## 2012-05-11 ENCOUNTER — Ambulatory Visit
Admission: RE | Admit: 2012-05-11 | Discharge: 2012-05-11 | Disposition: A | Discharge status: Home / Self Care | Payer: Medicare Other | Source: Ambulatory Visit | Attending: Internal Medicine | Admitting: Internal Medicine

## 2012-05-11 DIAGNOSIS — Z853 Personal history of malignant neoplasm of breast: Secondary | ICD-10-CM

## 2012-05-11 DIAGNOSIS — I10 Essential (primary) hypertension: Secondary | ICD-10-CM | POA: Diagnosis not present

## 2012-05-11 DIAGNOSIS — Z7901 Long term (current) use of anticoagulants: Secondary | ICD-10-CM | POA: Diagnosis not present

## 2012-05-11 DIAGNOSIS — Z1231 Encounter for screening mammogram for malignant neoplasm of breast: Secondary | ICD-10-CM | POA: Diagnosis not present

## 2012-05-11 DIAGNOSIS — I4891 Unspecified atrial fibrillation: Secondary | ICD-10-CM | POA: Diagnosis not present

## 2012-05-11 DIAGNOSIS — Z9889 Other specified postprocedural states: Secondary | ICD-10-CM

## 2012-06-14 DIAGNOSIS — I4891 Unspecified atrial fibrillation: Secondary | ICD-10-CM | POA: Diagnosis not present

## 2012-06-14 DIAGNOSIS — Z7901 Long term (current) use of anticoagulants: Secondary | ICD-10-CM | POA: Diagnosis not present

## 2012-07-14 DIAGNOSIS — Z7901 Long term (current) use of anticoagulants: Secondary | ICD-10-CM | POA: Diagnosis not present

## 2012-07-14 DIAGNOSIS — I4891 Unspecified atrial fibrillation: Secondary | ICD-10-CM | POA: Diagnosis not present

## 2012-07-20 DIAGNOSIS — Z7901 Long term (current) use of anticoagulants: Secondary | ICD-10-CM | POA: Diagnosis not present

## 2012-07-20 DIAGNOSIS — I4891 Unspecified atrial fibrillation: Secondary | ICD-10-CM | POA: Diagnosis not present

## 2012-07-20 DIAGNOSIS — I5032 Chronic diastolic (congestive) heart failure: Secondary | ICD-10-CM | POA: Diagnosis not present

## 2012-07-20 DIAGNOSIS — I10 Essential (primary) hypertension: Secondary | ICD-10-CM | POA: Diagnosis not present

## 2012-07-22 ENCOUNTER — Other Ambulatory Visit: Payer: Self-pay | Admitting: Dermatology

## 2012-07-22 DIAGNOSIS — L570 Actinic keratosis: Secondary | ICD-10-CM | POA: Diagnosis not present

## 2012-07-22 DIAGNOSIS — L820 Inflamed seborrheic keratosis: Secondary | ICD-10-CM | POA: Diagnosis not present

## 2012-07-22 DIAGNOSIS — L578 Other skin changes due to chronic exposure to nonionizing radiation: Secondary | ICD-10-CM | POA: Diagnosis not present

## 2012-07-22 DIAGNOSIS — Z85828 Personal history of other malignant neoplasm of skin: Secondary | ICD-10-CM | POA: Diagnosis not present

## 2012-07-22 DIAGNOSIS — D485 Neoplasm of uncertain behavior of skin: Secondary | ICD-10-CM | POA: Diagnosis not present

## 2012-08-16 DIAGNOSIS — I4891 Unspecified atrial fibrillation: Secondary | ICD-10-CM | POA: Diagnosis not present

## 2012-08-16 DIAGNOSIS — Z7901 Long term (current) use of anticoagulants: Secondary | ICD-10-CM | POA: Diagnosis not present

## 2012-09-12 DIAGNOSIS — H11069 Recurrent pterygium of unspecified eye: Secondary | ICD-10-CM | POA: Diagnosis not present

## 2012-09-12 DIAGNOSIS — Z961 Presence of intraocular lens: Secondary | ICD-10-CM | POA: Diagnosis not present

## 2012-09-15 DIAGNOSIS — Z7901 Long term (current) use of anticoagulants: Secondary | ICD-10-CM | POA: Diagnosis not present

## 2012-09-15 DIAGNOSIS — I4891 Unspecified atrial fibrillation: Secondary | ICD-10-CM | POA: Diagnosis not present

## 2012-09-26 DIAGNOSIS — K219 Gastro-esophageal reflux disease without esophagitis: Secondary | ICD-10-CM | POA: Diagnosis not present

## 2012-09-26 DIAGNOSIS — R7309 Other abnormal glucose: Secondary | ICD-10-CM | POA: Diagnosis not present

## 2012-09-26 DIAGNOSIS — I4891 Unspecified atrial fibrillation: Secondary | ICD-10-CM | POA: Diagnosis not present

## 2012-09-26 DIAGNOSIS — I10 Essential (primary) hypertension: Secondary | ICD-10-CM | POA: Diagnosis not present

## 2012-09-26 DIAGNOSIS — I2510 Atherosclerotic heart disease of native coronary artery without angina pectoris: Secondary | ICD-10-CM | POA: Diagnosis not present

## 2012-09-26 DIAGNOSIS — Z7901 Long term (current) use of anticoagulants: Secondary | ICD-10-CM | POA: Diagnosis not present

## 2012-09-26 DIAGNOSIS — Z6825 Body mass index (BMI) 25.0-25.9, adult: Secondary | ICD-10-CM | POA: Diagnosis not present

## 2012-10-20 DIAGNOSIS — Z7901 Long term (current) use of anticoagulants: Secondary | ICD-10-CM | POA: Diagnosis not present

## 2012-10-20 DIAGNOSIS — I4891 Unspecified atrial fibrillation: Secondary | ICD-10-CM | POA: Diagnosis not present

## 2012-10-21 ENCOUNTER — Other Ambulatory Visit: Payer: Self-pay | Admitting: Dermatology

## 2012-10-21 DIAGNOSIS — L570 Actinic keratosis: Secondary | ICD-10-CM | POA: Diagnosis not present

## 2012-10-21 DIAGNOSIS — L820 Inflamed seborrheic keratosis: Secondary | ICD-10-CM | POA: Diagnosis not present

## 2012-10-21 DIAGNOSIS — D1801 Hemangioma of skin and subcutaneous tissue: Secondary | ICD-10-CM | POA: Diagnosis not present

## 2012-10-21 DIAGNOSIS — D0470 Carcinoma in situ of skin of unspecified lower limb, including hip: Secondary | ICD-10-CM | POA: Diagnosis not present

## 2012-10-21 DIAGNOSIS — D0439 Carcinoma in situ of skin of other parts of face: Secondary | ICD-10-CM | POA: Diagnosis not present

## 2012-10-21 DIAGNOSIS — Z85828 Personal history of other malignant neoplasm of skin: Secondary | ICD-10-CM | POA: Diagnosis not present

## 2012-11-24 DIAGNOSIS — Z23 Encounter for immunization: Secondary | ICD-10-CM | POA: Diagnosis not present

## 2012-11-24 DIAGNOSIS — I4891 Unspecified atrial fibrillation: Secondary | ICD-10-CM | POA: Diagnosis not present

## 2012-11-24 DIAGNOSIS — Z7901 Long term (current) use of anticoagulants: Secondary | ICD-10-CM | POA: Diagnosis not present

## 2012-12-27 DIAGNOSIS — Z7901 Long term (current) use of anticoagulants: Secondary | ICD-10-CM | POA: Diagnosis not present

## 2012-12-27 DIAGNOSIS — I4891 Unspecified atrial fibrillation: Secondary | ICD-10-CM | POA: Diagnosis not present

## 2013-01-24 DIAGNOSIS — Z7901 Long term (current) use of anticoagulants: Secondary | ICD-10-CM | POA: Diagnosis not present

## 2013-01-24 DIAGNOSIS — I4891 Unspecified atrial fibrillation: Secondary | ICD-10-CM | POA: Diagnosis not present

## 2013-02-21 DIAGNOSIS — Z7901 Long term (current) use of anticoagulants: Secondary | ICD-10-CM | POA: Diagnosis not present

## 2013-02-21 DIAGNOSIS — I4891 Unspecified atrial fibrillation: Secondary | ICD-10-CM | POA: Diagnosis not present

## 2013-03-03 DIAGNOSIS — R509 Fever, unspecified: Secondary | ICD-10-CM | POA: Diagnosis not present

## 2013-03-31 DIAGNOSIS — I2510 Atherosclerotic heart disease of native coronary artery without angina pectoris: Secondary | ICD-10-CM | POA: Diagnosis not present

## 2013-03-31 DIAGNOSIS — I10 Essential (primary) hypertension: Secondary | ICD-10-CM | POA: Diagnosis not present

## 2013-03-31 DIAGNOSIS — Z7901 Long term (current) use of anticoagulants: Secondary | ICD-10-CM | POA: Diagnosis not present

## 2013-03-31 DIAGNOSIS — I4891 Unspecified atrial fibrillation: Secondary | ICD-10-CM | POA: Diagnosis not present

## 2013-03-31 DIAGNOSIS — M949 Disorder of cartilage, unspecified: Secondary | ICD-10-CM | POA: Diagnosis not present

## 2013-03-31 DIAGNOSIS — D539 Nutritional anemia, unspecified: Secondary | ICD-10-CM | POA: Diagnosis not present

## 2013-03-31 DIAGNOSIS — M899 Disorder of bone, unspecified: Secondary | ICD-10-CM | POA: Diagnosis not present

## 2013-04-03 DIAGNOSIS — R7309 Other abnormal glucose: Secondary | ICD-10-CM | POA: Diagnosis not present

## 2013-04-07 DIAGNOSIS — R0989 Other specified symptoms and signs involving the circulatory and respiratory systems: Secondary | ICD-10-CM | POA: Diagnosis not present

## 2013-04-07 DIAGNOSIS — Z0000 Encounter for general adult medical examination without abnormal findings: Secondary | ICD-10-CM | POA: Diagnosis not present

## 2013-04-07 DIAGNOSIS — C50919 Malignant neoplasm of unspecified site of unspecified female breast: Secondary | ICD-10-CM | POA: Diagnosis not present

## 2013-04-07 DIAGNOSIS — I10 Essential (primary) hypertension: Secondary | ICD-10-CM | POA: Diagnosis not present

## 2013-04-07 DIAGNOSIS — I4891 Unspecified atrial fibrillation: Secondary | ICD-10-CM | POA: Diagnosis not present

## 2013-04-07 DIAGNOSIS — R809 Proteinuria, unspecified: Secondary | ICD-10-CM | POA: Diagnosis not present

## 2013-04-07 DIAGNOSIS — R7309 Other abnormal glucose: Secondary | ICD-10-CM | POA: Diagnosis not present

## 2013-04-07 DIAGNOSIS — Z1331 Encounter for screening for depression: Secondary | ICD-10-CM | POA: Diagnosis not present

## 2013-04-07 DIAGNOSIS — R0609 Other forms of dyspnea: Secondary | ICD-10-CM | POA: Diagnosis not present

## 2013-04-07 DIAGNOSIS — Z7901 Long term (current) use of anticoagulants: Secondary | ICD-10-CM | POA: Diagnosis not present

## 2013-04-12 DIAGNOSIS — Z1212 Encounter for screening for malignant neoplasm of rectum: Secondary | ICD-10-CM | POA: Diagnosis not present

## 2013-05-04 DIAGNOSIS — Z7901 Long term (current) use of anticoagulants: Secondary | ICD-10-CM | POA: Diagnosis not present

## 2013-05-04 DIAGNOSIS — I4891 Unspecified atrial fibrillation: Secondary | ICD-10-CM | POA: Diagnosis not present

## 2013-05-15 ENCOUNTER — Ambulatory Visit: Payer: Medicare Other | Admitting: Interventional Cardiology

## 2013-06-06 DIAGNOSIS — Z7901 Long term (current) use of anticoagulants: Secondary | ICD-10-CM | POA: Diagnosis not present

## 2013-06-06 DIAGNOSIS — I4891 Unspecified atrial fibrillation: Secondary | ICD-10-CM | POA: Diagnosis not present

## 2013-06-13 ENCOUNTER — Encounter: Payer: Self-pay | Admitting: Interventional Cardiology

## 2013-06-13 ENCOUNTER — Ambulatory Visit (INDEPENDENT_AMBULATORY_CARE_PROVIDER_SITE_OTHER): Payer: Medicare Other | Admitting: Interventional Cardiology

## 2013-06-13 VITALS — BP 137/78 | HR 72 | Ht 64.0 in | Wt 143.0 lb

## 2013-06-13 DIAGNOSIS — I5032 Chronic diastolic (congestive) heart failure: Secondary | ICD-10-CM | POA: Diagnosis not present

## 2013-06-13 DIAGNOSIS — I4891 Unspecified atrial fibrillation: Secondary | ICD-10-CM | POA: Diagnosis not present

## 2013-06-13 DIAGNOSIS — I2781 Cor pulmonale (chronic): Secondary | ICD-10-CM

## 2013-06-13 DIAGNOSIS — I10 Essential (primary) hypertension: Secondary | ICD-10-CM

## 2013-06-13 DIAGNOSIS — Z7901 Long term (current) use of anticoagulants: Secondary | ICD-10-CM

## 2013-06-13 DIAGNOSIS — I482 Chronic atrial fibrillation, unspecified: Secondary | ICD-10-CM

## 2013-06-13 DIAGNOSIS — R0789 Other chest pain: Secondary | ICD-10-CM | POA: Diagnosis not present

## 2013-06-13 DIAGNOSIS — R0600 Dyspnea, unspecified: Secondary | ICD-10-CM

## 2013-06-13 DIAGNOSIS — R0989 Other specified symptoms and signs involving the circulatory and respiratory systems: Secondary | ICD-10-CM

## 2013-06-13 DIAGNOSIS — R0609 Other forms of dyspnea: Secondary | ICD-10-CM

## 2013-06-13 HISTORY — DX: Essential (primary) hypertension: I10

## 2013-06-13 HISTORY — DX: Cor pulmonale (chronic): I27.81

## 2013-06-13 NOTE — Progress Notes (Signed)
Patient ID: [name], female   DOB: 1924-04-21, 80 y.o.   MRN: [id number]    [address], [place]  [address] Phone: [phone] Fax:  [phone]  Date:  06/13/2013   ID:  [name], DOB [birth year], MRN [id number]  PCP:  No primary provider on file.   ASSESSMENT:  1. chronic diastolic heart failure, slightly decompensated 2. Chest pressure when dyspneic 3. Chronic atrial fibrillation 4. Hypertension 5. Anticoagulation  PLAN:  1. Lasix 80 mg times one dose then back to 40 mg per day and decreased fluid intake 2. If dyspnea doesn't improve with a slightly more diuresis, will perform a myocardial perfusion study with pharmacologic stress to exclude CAD 3. Clinical followup in 6 months   SUBJECTIVE: [name] is a 80 y.o. female who is being limited by exertional dyspnea. There is no orthopnea PND. For the first time, she tells me that dyspnea is associated with chest tightness. These symptoms do not occur at rest. She has no discomfort at rest. She denies edema. She has not had lightheadedness, dizziness, or syncope. She denies palpitations. No bleeding on Coumadin therapy.   Wt Readings from Last 3 Encounters:  06/13/13 143 lb (64.864 kg)     No past medical history on file.  Current Outpatient Prescriptions  Medication Sig Dispense Refill  . ALPRAZolam (XANAX) 0.5 MG tablet 1 tab at night      . amLODipine (NORVASC) 2.5 MG tablet Take 1 tab daily      . aspirin 81 MG tablet Take 81 mg by mouth daily.      . Calcium Carbonate-Vit D-Min (CALCIUM 1200 PO) Take by mouth. daily      . furosemide (LASIX) 40 MG tablet Take 1 tab daily      . losartan (COZAAR) 100 MG tablet Take 1 tab daily      . metoprolol succinate (TOPROL-XL) 100 MG 24 hr tablet Take 1 tab daily      . potassium chloride (K-DUR) 10 MEQ tablet Take 1 tab daily      . warfarin (COUMADIN) 2 MG tablet As directed       No current facility-administered medications for  this visit.    Allergies:    Allergies  Allergen Reactions  . Codeine Nausea Only    Social History:  The patient  reports that she has quit smoking. She does not have any smokeless tobacco history on file.   ROS:  Please see the history of present illness.   Appetite stable.   All other systems reviewed and negative.   OBJECTIVE: VS:  BP 137/78  Pulse 72  Ht _0  (1.626 m)  Wt 143 lb (64.864 kg)  BMI 24.53 kg/m2 Well nourished, well developed, in no acute distress, elderly HEENT: normal Neck: JVD elevated. Carotid bruit absent  Cardiac:  normal S1, S2; IIRR; no murmur Lungs:  clear to auscultation bilaterally, no wheezing, rhonchi or rales Abd: soft, nontender, no hepatomegaly Ext: Edema trace bilateral. Pulses 2+ Skin: warm and dry Neuro:  CNs 2-12 intact, no focal abnormalities noted  EKG:  Atrial fibrillation, left axis deviation, ventricular rate 72 beats per minute, widened QRS compatible with left bundle branch block       Signed, Illene Labrador III, MD 06/13/2013 2:42 PM

## 2013-06-13 NOTE — Patient Instructions (Signed)
Your physician has recommended you make the following change in your medication:  1) TAKE 1 dose of Lasix 33m tomorrow only.then back down to 438mdaily  REDUCE your fluid intake  Call the office next week to give an update on your shortness of breath. [phone] physician wants you to follow-up in: 6 months You will receive a reminder letter in the mail two months in advance. If you don't receive a letter, please call our office to schedule the follow-up appointment.

## 2013-06-21 DIAGNOSIS — Z7901 Long term (current) use of anticoagulants: Secondary | ICD-10-CM | POA: Diagnosis not present

## 2013-06-21 DIAGNOSIS — I4891 Unspecified atrial fibrillation: Secondary | ICD-10-CM | POA: Diagnosis not present

## 2013-06-29 ENCOUNTER — Other Ambulatory Visit: Payer: Self-pay | Admitting: Interventional Cardiology

## 2013-07-18 DIAGNOSIS — I4891 Unspecified atrial fibrillation: Secondary | ICD-10-CM | POA: Diagnosis not present

## 2013-07-18 DIAGNOSIS — Z7901 Long term (current) use of anticoagulants: Secondary | ICD-10-CM | POA: Diagnosis not present

## 2013-07-28 ENCOUNTER — Other Ambulatory Visit: Payer: Self-pay | Admitting: Interventional Cardiology

## 2013-08-15 ENCOUNTER — Other Ambulatory Visit: Payer: Self-pay | Admitting: Interventional Cardiology

## 2013-08-23 DIAGNOSIS — I4891 Unspecified atrial fibrillation: Secondary | ICD-10-CM | POA: Diagnosis not present

## 2013-08-23 DIAGNOSIS — Z7901 Long term (current) use of anticoagulants: Secondary | ICD-10-CM | POA: Diagnosis not present

## 2013-09-19 ENCOUNTER — Other Ambulatory Visit: Payer: Self-pay

## 2013-09-19 DIAGNOSIS — Z1231 Encounter for screening mammogram for malignant neoplasm of breast: Secondary | ICD-10-CM

## 2013-09-19 DIAGNOSIS — Z853 Personal history of malignant neoplasm of breast: Secondary | ICD-10-CM

## 2013-09-19 DIAGNOSIS — Z9889 Other specified postprocedural states: Secondary | ICD-10-CM

## 2013-09-26 DIAGNOSIS — Z7901 Long term (current) use of anticoagulants: Secondary | ICD-10-CM | POA: Diagnosis not present

## 2013-09-26 DIAGNOSIS — I4891 Unspecified atrial fibrillation: Secondary | ICD-10-CM | POA: Diagnosis not present

## 2013-09-29 ENCOUNTER — Ambulatory Visit: Payer: Medicare Other

## 2013-10-05 DIAGNOSIS — Z7901 Long term (current) use of anticoagulants: Secondary | ICD-10-CM | POA: Diagnosis not present

## 2013-10-05 DIAGNOSIS — I10 Essential (primary) hypertension: Secondary | ICD-10-CM | POA: Diagnosis not present

## 2013-10-05 DIAGNOSIS — I5032 Chronic diastolic (congestive) heart failure: Secondary | ICD-10-CM | POA: Diagnosis not present

## 2013-10-05 DIAGNOSIS — I4891 Unspecified atrial fibrillation: Secondary | ICD-10-CM | POA: Diagnosis not present

## 2013-10-05 DIAGNOSIS — IMO0002 Reserved for concepts with insufficient information to code with codable children: Secondary | ICD-10-CM | POA: Diagnosis not present

## 2013-10-05 DIAGNOSIS — R0609 Other forms of dyspnea: Secondary | ICD-10-CM | POA: Diagnosis not present

## 2013-10-05 DIAGNOSIS — I2510 Atherosclerotic heart disease of native coronary artery without angina pectoris: Secondary | ICD-10-CM | POA: Diagnosis not present

## 2013-10-06 ENCOUNTER — Ambulatory Visit
Admission: RE | Admit: 2013-10-06 | Discharge: 2013-10-06 | Disposition: A | Discharge status: Home / Self Care | Payer: Medicare Other | Source: Ambulatory Visit

## 2013-10-06 DIAGNOSIS — Z1231 Encounter for screening mammogram for malignant neoplasm of breast: Secondary | ICD-10-CM | POA: Diagnosis not present

## 2013-10-06 DIAGNOSIS — Z853 Personal history of malignant neoplasm of breast: Secondary | ICD-10-CM

## 2013-10-06 DIAGNOSIS — Z9889 Other specified postprocedural states: Secondary | ICD-10-CM

## 2013-10-20 ENCOUNTER — Telehealth: Payer: Self-pay | Admitting: Interventional Cardiology

## 2013-10-20 NOTE — Telephone Encounter (Signed)
New problem:  Daughter called today I returned her call for some more pt infromation...Marland KitchenMarland KitchenMarland Kitchenplease give her a call back.    Hey Elmo Putt and Lattie Haw!  I work at the [employer] and need some help!! My Mom is a patient of Dr. Thompson Caul. She continues to have SOB and it seems to be getting worse to the point that it is interfering with what she does each day. She has increased her Lasix as was discussed at her last visit but it doesn't seem to improve anything. Can we get her in to see Dr. Tamala Julian? Afternoons are best for me to bring her. We are going out of town for a week (hopefully!) the week of September 6 and I would really like to get her in before that!!  Please let me know what you can do!  Thanks  Deedie

## 2013-10-25 DIAGNOSIS — Z7901 Long term (current) use of anticoagulants: Secondary | ICD-10-CM | POA: Diagnosis not present

## 2013-10-25 DIAGNOSIS — I4891 Unspecified atrial fibrillation: Secondary | ICD-10-CM | POA: Diagnosis not present

## 2013-10-27 ENCOUNTER — Ambulatory Visit (INDEPENDENT_AMBULATORY_CARE_PROVIDER_SITE_OTHER): Payer: Medicare Other | Admitting: Interventional Cardiology

## 2013-10-27 ENCOUNTER — Encounter: Payer: Self-pay | Admitting: Interventional Cardiology

## 2013-10-27 VITALS — BP 126/72 | HR 65 | Ht 64.0 in | Wt 142.0 lb

## 2013-10-27 DIAGNOSIS — Z7901 Long term (current) use of anticoagulants: Secondary | ICD-10-CM

## 2013-10-27 DIAGNOSIS — R0600 Dyspnea, unspecified: Secondary | ICD-10-CM

## 2013-10-27 DIAGNOSIS — I482 Chronic atrial fibrillation, unspecified: Secondary | ICD-10-CM

## 2013-10-27 DIAGNOSIS — R0609 Other forms of dyspnea: Secondary | ICD-10-CM | POA: Diagnosis not present

## 2013-10-27 DIAGNOSIS — I4891 Unspecified atrial fibrillation: Secondary | ICD-10-CM

## 2013-10-27 DIAGNOSIS — I5032 Chronic diastolic (congestive) heart failure: Secondary | ICD-10-CM | POA: Diagnosis not present

## 2013-10-27 DIAGNOSIS — I10 Essential (primary) hypertension: Secondary | ICD-10-CM

## 2013-10-27 DIAGNOSIS — R0989 Other specified symptoms and signs involving the circulatory and respiratory systems: Secondary | ICD-10-CM

## 2013-10-27 LAB — BRAIN NATRIURETIC PEPTIDE: PRO B NATRI PEPTIDE: 295 pg/mL — AB (ref 0.0–100.0)

## 2013-10-27 LAB — BASIC METABOLIC PANEL
BUN: 20 mg/dL (ref 6–23)
CO2: 32 mEq/L (ref 19–32)
Calcium: 9.2 mg/dL (ref 8.4–10.5)
Chloride: 97 mEq/L (ref 96–112)
Creatinine, Ser: 1.1 mg/dL (ref 0.4–1.2)
GFR: 50.42 mL/min — ABNORMAL LOW (ref >60.00)
Glucose, Bld: 107 mg/dL — ABNORMAL HIGH (ref 70–99)
POTASSIUM: 3.6 meq/L (ref 3.5–5.1)
Sodium: 138 mEq/L (ref 135–145)

## 2013-10-27 NOTE — Patient Instructions (Signed)
Your physician has requested that you have an echocardiogram. Echocardiography is a painless test that uses sound waves to create images of your heart. It provides your doctor with information about the size and shape of your heart and how well your heart's chambers and valves are working. This procedure takes approximately one hour. There are no restrictions for this procedure.  Your physician recommends that you have lab work today: BMP and BNP  Your physician recommends that you schedule a follow-up appointment in: [place] with Dr Tamala Julian

## 2013-10-27 NOTE — Progress Notes (Signed)
Patient ID: [name], female   DOB: 24-Jan-1925, 80 y.o.   MRN: [id number]    [address], [address] [place], [place]  [address] Phone: [phone] Fax:  [phone]  Date:  10/27/2013   ID:  [name], DOB [birth year], MRN [id number]  PCP:  [name], MD   ASSESSMENT:  1. Dyspnea 2. Diastolic heart failure 3. Hypertension 4. Chronic atrial fibrillation 5. Hypertension  PLAN:  1. basic metabolic panel and brain atretic peptide today 2. 2-D Doppler echocardiogram 3. Clinical followup in 6 weeks to assess medication adjustment   SUBJECTIVE: [name] is a 80 y.o. female who has progressive dyspnea. She denies orthopnea lower extremity swelling. Lifestyle is being significantly altered by exertional dyspnea and fatigue. She denies melena. No chest pain. She has not had significant palpitations or syncope.   Wt Readings from Last 3 Encounters:  10/27/13 142 lb (64.411 kg)  06/13/13 143 lb (64.864 kg)     No past medical history on file.  Current Outpatient Prescriptions  Medication Sig Dispense Refill  . ALPRAZolam (XANAX) 0.5 MG tablet 1 tab at night      . amLODipine (NORVASC) 2.5 MG tablet Take 1 tab daily      . aspirin 81 MG tablet Take 81 mg by mouth daily.      . Calcium Carbonate-Vit D-Min (CALCIUM 1200 PO) Take by mouth. daily      . furosemide (LASIX) 40 MG tablet TAKE 1 TABLET DAILY.  30 tablet  3  . losartan (COZAAR) 100 MG tablet TAKE 1 TABLET ONCE DAILY.  30 tablet  4  . metoprolol succinate (TOPROL-XL) 100 MG 24 hr tablet Take 1 tab daily      . potassium chloride (K-DUR) 10 MEQ tablet TAKE 1 TABLET ONCE DAILY.  30 tablet  3  . warfarin (COUMADIN) 2 MG tablet ONLY ON SUN MON WED FRI AND SAT As directed      . warfarin (COUMADIN) 4 MG tablet Take 4 mg by mouth daily. ONLY ON TUE AND THURS       No current facility-administered medications for this visit.    Allergies:    Allergies  Allergen Reactions  . Codeine Nausea Only     Social History:  The patient  reports that she has quit smoking. She does not have any smokeless tobacco history on file.   ROS:  Please see the history of present illness.   Appetite is stable. No orthopnea.   All other systems reviewed and negative.   OBJECTIVE: VS:  BP 126/72  Pulse 65  Ht _0  (1.626 m)  Wt 142 lb (64.411 kg)  BMI 24.36 kg/m2 Well nourished, well developed, in no acute distress, elderly HEENT: normal Neck: JVD flat. Carotid bruit absent  Cardiac:  normal S1, S2; RRR; no murmur Lungs:  clear to auscultation bilaterally, no wheezing, rhonchi or rales Abd: soft, nontender, no hepatomegaly Ext: Edema absent. Pulses 2+ Skin: warm and dry Neuro:  CNs 2-12 intact, no focal abnormalities noted  EKG:  Not performed       Signed, Illene Labrador III, MD 10/27/2013 8:15 AM

## 2013-10-31 ENCOUNTER — Ambulatory Visit (HOSPITAL_COMMUNITY)
Admission: RE | Admit: 2013-10-31 | Discharge: 2013-10-31 | Disposition: A | Discharge status: Home / Self Care | Payer: Medicare Other | Source: Ambulatory Visit | Attending: Cardiology | Admitting: Cardiology

## 2013-10-31 ENCOUNTER — Other Ambulatory Visit: Payer: Self-pay | Admitting: Interventional Cardiology

## 2013-10-31 ENCOUNTER — Ambulatory Visit (HOSPITAL_COMMUNITY): Payer: Medicare Other

## 2013-10-31 DIAGNOSIS — R0989 Other specified symptoms and signs involving the circulatory and respiratory systems: Secondary | ICD-10-CM | POA: Insufficient documentation

## 2013-10-31 DIAGNOSIS — R0602 Shortness of breath: Secondary | ICD-10-CM

## 2013-10-31 DIAGNOSIS — I10 Essential (primary) hypertension: Secondary | ICD-10-CM | POA: Diagnosis not present

## 2013-10-31 DIAGNOSIS — R079 Chest pain, unspecified: Secondary | ICD-10-CM | POA: Diagnosis not present

## 2013-10-31 DIAGNOSIS — R0600 Dyspnea, unspecified: Secondary | ICD-10-CM

## 2013-10-31 DIAGNOSIS — I482 Chronic atrial fibrillation, unspecified: Secondary | ICD-10-CM

## 2013-10-31 DIAGNOSIS — I079 Rheumatic tricuspid valve disease, unspecified: Secondary | ICD-10-CM | POA: Insufficient documentation

## 2013-10-31 DIAGNOSIS — I369 Nonrheumatic tricuspid valve disorder, unspecified: Secondary | ICD-10-CM

## 2013-10-31 DIAGNOSIS — I4891 Unspecified atrial fibrillation: Secondary | ICD-10-CM | POA: Diagnosis not present

## 2013-10-31 DIAGNOSIS — I517 Cardiomegaly: Secondary | ICD-10-CM | POA: Diagnosis not present

## 2013-10-31 DIAGNOSIS — I5032 Chronic diastolic (congestive) heart failure: Secondary | ICD-10-CM

## 2013-10-31 DIAGNOSIS — R0609 Other forms of dyspnea: Secondary | ICD-10-CM | POA: Insufficient documentation

## 2013-10-31 NOTE — Progress Notes (Signed)
2D Echo Performed 10/31/2013    Marygrace Drought, RCS

## 2013-11-01 ENCOUNTER — Telehealth: Payer: Self-pay

## 2013-11-01 NOTE — Telephone Encounter (Signed)
Message copied by Lamar Laundry on Wed Nov 01, 2013 12:27 PM ------      Message from: Daneen Schick      Created: Tue Oct 31, 2013  7:04 PM       Labs suggest mild fluid build up.Take furosemide twice daily for 2-3 days then back to daily ------

## 2013-11-01 NOTE — Telephone Encounter (Signed)
called to give pt echo results and  to give Dr.Smith recommendations on furosemide dosage.

## 2013-11-01 NOTE — Telephone Encounter (Signed)
Back to 60 mg a day after the 40 mg BID

## 2013-11-01 NOTE — Telephone Encounter (Signed)
pt given lab results and Dr.Smith instructions.Labs suggest mild fluid build up.Take furosemide twice daily 30m bid.for 2-3 days then back to daily.pt verbalized understanding.

## 2013-11-02 MED ORDER — FUROSEMIDE 40 MG PO TABS: 60.0000 mg | ORAL_TABLET | Freq: Every day | ORAL | Status: DC

## 2013-11-02 NOTE — Telephone Encounter (Signed)
Message copied by Lamar Laundry on Thu Nov 02, 2013  9:06 AM ------      Message from: Daneen Schick      Created: Tue Oct 31, 2013  7:12 PM       Moderate pulmonary hypertension. Will improve with increasing furosemide to get fluid off. ------

## 2013-11-02 NOTE — Telephone Encounter (Signed)
Pt and pt daughter aware of echo results.Moderate pulmonary hypertension. Will improve with increasing furosemide to get fluid off.pt will increase furosemide to 80m bid and then resume 638mdaily.Rx sent to pt pharmacy Pt and pt daughter verbalized understanding.

## 2013-11-15 DIAGNOSIS — Z7901 Long term (current) use of anticoagulants: Secondary | ICD-10-CM | POA: Diagnosis not present

## 2013-11-15 DIAGNOSIS — I4891 Unspecified atrial fibrillation: Secondary | ICD-10-CM | POA: Diagnosis not present

## 2013-12-11 DIAGNOSIS — Z961 Presence of intraocular lens: Secondary | ICD-10-CM | POA: Diagnosis not present

## 2013-12-11 DIAGNOSIS — H0012 Chalazion right lower eyelid: Secondary | ICD-10-CM | POA: Diagnosis not present

## 2013-12-11 DIAGNOSIS — H04123 Dry eye syndrome of bilateral lacrimal glands: Secondary | ICD-10-CM | POA: Diagnosis not present

## 2013-12-11 DIAGNOSIS — H11062 Recurrent pterygium of left eye: Secondary | ICD-10-CM | POA: Diagnosis not present

## 2013-12-27 DIAGNOSIS — I480 Paroxysmal atrial fibrillation: Secondary | ICD-10-CM | POA: Diagnosis not present

## 2013-12-27 DIAGNOSIS — Z7901 Long term (current) use of anticoagulants: Secondary | ICD-10-CM | POA: Diagnosis not present

## 2013-12-27 DIAGNOSIS — Z23 Encounter for immunization: Secondary | ICD-10-CM | POA: Diagnosis not present

## 2013-12-27 DIAGNOSIS — Z283 Underimmunization status: Secondary | ICD-10-CM | POA: Diagnosis not present

## 2014-01-04 ENCOUNTER — Other Ambulatory Visit: Payer: Self-pay | Admitting: Interventional Cardiology

## 2014-01-08 ENCOUNTER — Ambulatory Visit (INDEPENDENT_AMBULATORY_CARE_PROVIDER_SITE_OTHER): Payer: Medicare Other | Admitting: Interventional Cardiology

## 2014-01-08 ENCOUNTER — Encounter: Payer: Self-pay | Admitting: Interventional Cardiology

## 2014-01-08 VITALS — BP 120/72 | HR 72 | Ht 64.0 in | Wt 148.0 lb

## 2014-01-08 DIAGNOSIS — I482 Chronic atrial fibrillation, unspecified: Secondary | ICD-10-CM

## 2014-01-08 DIAGNOSIS — Z7901 Long term (current) use of anticoagulants: Secondary | ICD-10-CM | POA: Diagnosis not present

## 2014-01-08 DIAGNOSIS — I5032 Chronic diastolic (congestive) heart failure: Secondary | ICD-10-CM | POA: Diagnosis not present

## 2014-01-08 DIAGNOSIS — I10 Essential (primary) hypertension: Secondary | ICD-10-CM | POA: Diagnosis not present

## 2014-01-08 NOTE — Progress Notes (Signed)
Patient ID: [name], female   DOB: 04-04-1924, 80 y.o.   MRN: [id number]    [address], [address] [place], [place]  [address] Phone: [phone] Fax:  [phone]  Date:  01/08/2014   ID:  [name], DOB September 02, 1924, MRN [id number]  PCP:  [name], MD   ASSESSMENT:  1. Chronic diastolic heart failure, stable on current diuretic regimen 2. Atrial fibrillation, stable with good rate control 3. Hypertension, controlled  PLAN:  1. Continue furosemide 60 mg per day. If edema or increasing shortness of breath consider take 1 g daily 2. Low salt diet 3. Clinical follow-up in 6-9 months   SUBJECTIVE: [name] is a 80 y.o. female who feels the breathing is not much improved. She continues to do aerobic activities haven't [facility] [place]. She denies chest pain. No orthopnea PND. No tachycardia palpitations or edema. She has not had syncope. I do note an 8 pound weight gain but she has all more close today than in August.  Wt Readings from Last 3 Encounters:  01/08/14 148 lb (67.132 kg)  10/27/13 142 lb (64.411 kg)  06/13/13 143 lb (64.864 kg)     Past Medical History  Diagnosis Date  . Breast cancer     left   . Hypertension   . A-fib     Current Outpatient Prescriptions  Medication Sig Dispense Refill  . ALPRAZolam (XANAX) 0.5 MG tablet 1 tab at night    . amLODipine (NORVASC) 2.5 MG tablet Take 1 tab daily    . aspirin 81 MG tablet Take 81 mg by mouth daily.    . Calcium Carbonate-Vit D-Min (CALCIUM 1200 PO) Take by mouth. daily    . furosemide (LASIX) 40 MG tablet Take 1.5 tablets (60 mg total) by mouth daily. 45 tablet 5  . losartan (COZAAR) 100 MG tablet TAKE 1 TABLET ONCE DAILY. 30 tablet 0  . metoprolol succinate (TOPROL-XL) 100 MG 24 hr tablet Take 1 tab daily    . potassium chloride (K-DUR) 10 MEQ tablet TAKE 1 TABLET ONCE DAILY. 30 tablet 0  . warfarin (COUMADIN) 2 MG tablet ONLY ON SUN MON WED FRI AND SAT As directed     . warfarin (COUMADIN) 4 MG tablet Take 4 mg by mouth daily. ONLY ON TUE AND THURS     No current facility-administered medications for this visit.    Allergies:    Allergies  Allergen Reactions  . Codeine Nausea Only    Nausea     Social History:  The patient  reports that she has quit smoking. She does not have any smokeless tobacco history on file. She reports that she drinks about 1.2 oz of alcohol per week. She reports that she does not use illicit drugs.   ROS:  Please see the history of present illness.   Denies orthopnea. Denies transient neurological symptoms. Appetite is been stable.   All other systems reviewed and negative.   OBJECTIVE: VS:  BP 120/72 mmHg  Pulse 72  Ht _0  (1.626 m)  Wt 148 lb (67.132 kg)  BMI 25.39 kg/m2 Well nourished, well developed, in no acute distress, elderly but compensated appearing HEENT: normal Neck: JVD flat. Carotid bruit absent  Cardiac:  normal S1, S2; IIRR; no murmur Lungs:  clear to auscultation bilaterally, no wheezing, rhonchi or rales Abd: soft, nontender, no hepatomegaly Ext: Edema absent. Pulses 2+ Skin: warm and dry Neuro:  CNs 2-12 intact, no focal abnormalities noted  EKG:  Not performed       Signed, Illene Labrador III, MD 01/08/2014 4:09 PM

## 2014-01-08 NOTE — Patient Instructions (Signed)
Your physician recommends that you continue on your current medications as directed. Please refer to the Current Medication list given to you today.  Monitor your sodium intake  Your physician wants you to follow-up in: 6-9 months You will receive a reminder letter in the mail two months in advance. If you don't receive a letter, please call our office to schedule the follow-up appointment.

## 2014-01-31 DIAGNOSIS — Z7901 Long term (current) use of anticoagulants: Secondary | ICD-10-CM | POA: Diagnosis not present

## 2014-01-31 DIAGNOSIS — I480 Paroxysmal atrial fibrillation: Secondary | ICD-10-CM | POA: Diagnosis not present

## 2014-02-06 ENCOUNTER — Other Ambulatory Visit: Payer: Self-pay | Admitting: *Deleted

## 2014-02-06 ENCOUNTER — Other Ambulatory Visit: Payer: Self-pay | Admitting: Interventional Cardiology

## 2014-03-06 DIAGNOSIS — Z7901 Long term (current) use of anticoagulants: Secondary | ICD-10-CM | POA: Diagnosis not present

## 2014-03-06 DIAGNOSIS — I480 Paroxysmal atrial fibrillation: Secondary | ICD-10-CM | POA: Diagnosis not present

## 2014-04-03 DIAGNOSIS — I480 Paroxysmal atrial fibrillation: Secondary | ICD-10-CM | POA: Diagnosis not present

## 2014-04-03 DIAGNOSIS — Z7901 Long term (current) use of anticoagulants: Secondary | ICD-10-CM | POA: Diagnosis not present

## 2014-04-30 DIAGNOSIS — Z008 Encounter for other general examination: Secondary | ICD-10-CM | POA: Diagnosis not present

## 2014-04-30 DIAGNOSIS — I10 Essential (primary) hypertension: Secondary | ICD-10-CM | POA: Diagnosis not present

## 2014-04-30 DIAGNOSIS — D539 Nutritional anemia, unspecified: Secondary | ICD-10-CM | POA: Diagnosis not present

## 2014-04-30 DIAGNOSIS — I2510 Atherosclerotic heart disease of native coronary artery without angina pectoris: Secondary | ICD-10-CM | POA: Diagnosis not present

## 2014-04-30 DIAGNOSIS — R739 Hyperglycemia, unspecified: Secondary | ICD-10-CM | POA: Diagnosis not present

## 2014-04-30 DIAGNOSIS — M859 Disorder of bone density and structure, unspecified: Secondary | ICD-10-CM | POA: Diagnosis not present

## 2014-05-07 DIAGNOSIS — Z23 Encounter for immunization: Secondary | ICD-10-CM | POA: Diagnosis not present

## 2014-05-07 DIAGNOSIS — Z6824 Body mass index (BMI) 24.0-24.9, adult: Secondary | ICD-10-CM | POA: Diagnosis not present

## 2014-05-07 DIAGNOSIS — I272 Other secondary pulmonary hypertension: Secondary | ICD-10-CM | POA: Diagnosis not present

## 2014-05-07 DIAGNOSIS — E876 Hypokalemia: Secondary | ICD-10-CM | POA: Diagnosis not present

## 2014-05-07 DIAGNOSIS — N183 Chronic kidney disease, stage 3 (moderate): Secondary | ICD-10-CM | POA: Diagnosis not present

## 2014-05-07 DIAGNOSIS — Z1389 Encounter for screening for other disorder: Secondary | ICD-10-CM | POA: Diagnosis not present

## 2014-05-07 DIAGNOSIS — R739 Hyperglycemia, unspecified: Secondary | ICD-10-CM | POA: Diagnosis not present

## 2014-05-07 DIAGNOSIS — I5032 Chronic diastolic (congestive) heart failure: Secondary | ICD-10-CM | POA: Diagnosis not present

## 2014-05-07 DIAGNOSIS — Z0000 Encounter for general adult medical examination without abnormal findings: Secondary | ICD-10-CM | POA: Diagnosis not present

## 2014-05-07 DIAGNOSIS — I129 Hypertensive chronic kidney disease with stage 1 through stage 4 chronic kidney disease, or unspecified chronic kidney disease: Secondary | ICD-10-CM | POA: Diagnosis not present

## 2014-05-07 DIAGNOSIS — R0602 Shortness of breath: Secondary | ICD-10-CM | POA: Diagnosis not present

## 2014-05-07 DIAGNOSIS — Z7901 Long term (current) use of anticoagulants: Secondary | ICD-10-CM | POA: Diagnosis not present

## 2014-05-22 DIAGNOSIS — I480 Paroxysmal atrial fibrillation: Secondary | ICD-10-CM | POA: Diagnosis not present

## 2014-05-22 DIAGNOSIS — Z7901 Long term (current) use of anticoagulants: Secondary | ICD-10-CM | POA: Diagnosis not present

## 2014-06-04 DIAGNOSIS — H0012 Chalazion right lower eyelid: Secondary | ICD-10-CM | POA: Diagnosis not present

## 2014-06-04 DIAGNOSIS — H01001 Unspecified blepharitis right upper eyelid: Secondary | ICD-10-CM | POA: Diagnosis not present

## 2014-06-04 DIAGNOSIS — H11062 Recurrent pterygium of left eye: Secondary | ICD-10-CM | POA: Diagnosis not present

## 2014-06-04 DIAGNOSIS — H01004 Unspecified blepharitis left upper eyelid: Secondary | ICD-10-CM | POA: Diagnosis not present

## 2014-06-05 DIAGNOSIS — Z7901 Long term (current) use of anticoagulants: Secondary | ICD-10-CM | POA: Diagnosis not present

## 2014-06-05 DIAGNOSIS — I480 Paroxysmal atrial fibrillation: Secondary | ICD-10-CM | POA: Diagnosis not present

## 2014-07-09 ENCOUNTER — Ambulatory Visit (INDEPENDENT_AMBULATORY_CARE_PROVIDER_SITE_OTHER): Payer: Medicare Other | Admitting: Interventional Cardiology

## 2014-07-09 ENCOUNTER — Encounter: Payer: Self-pay | Admitting: Interventional Cardiology

## 2014-07-09 VITALS — BP 118/78 | HR 75 | Ht 64.0 in | Wt 141.8 lb

## 2014-07-09 DIAGNOSIS — I482 Chronic atrial fibrillation, unspecified: Secondary | ICD-10-CM

## 2014-07-09 DIAGNOSIS — Z7901 Long term (current) use of anticoagulants: Secondary | ICD-10-CM | POA: Diagnosis not present

## 2014-07-09 DIAGNOSIS — I10 Essential (primary) hypertension: Secondary | ICD-10-CM

## 2014-07-09 DIAGNOSIS — I5032 Chronic diastolic (congestive) heart failure: Secondary | ICD-10-CM

## 2014-07-09 MED ORDER — DIGOXIN 125 MCG PO TABS: 0.0625 mg | ORAL_TABLET | Freq: Every day | ORAL | Status: DC

## 2014-07-09 NOTE — Progress Notes (Signed)
Cardiology Office Note   Date:  07/09/2014   ID:  [name], DOB 11/08/1924, MRN [id number]  PCP:  [name], MD  Cardiologist:   Sinclair Grooms, MD   Chief Complaint  Patient presents with  . Chronic AFIB      History of Present Illness: [name] is a 81 y.o. female who presents for CAD, diastolic heart failure, atrial fibrillation, and hypertension.  [name] is doing relatively well but having difficulty with dyspnea on exertion especially walking up stairs or an incline. There is no orthopnea. She denies peripheral edema and chest pain.    Past Medical History  Diagnosis Date  . Breast cancer     left   . Hypertension   . A-fib     Past Surgical History  Procedure Laterality Date  . Breast lumpectomy    . Abdominal hysterectomy       Current Outpatient Prescriptions  Medication Sig Dispense Refill  . ALPRAZolam (XANAX) 0.5 MG tablet 1 tab at night    . amLODipine (NORVASC) 2.5 MG tablet Take 1 tab daily    . aspirin 81 MG tablet Take 81 mg by mouth daily.    . Calcium Carbonate-Vit D-Min (CALCIUM 1200 PO) Take 1 tablet by mouth daily.     . furosemide (LASIX) 40 MG tablet Take 1.5 tablets (60 mg total) by mouth daily. 45 tablet 5  . losartan (COZAAR) 100 MG tablet TAKE 1 TABLET ONCE DAILY. 30 tablet 6  . metoprolol succinate (TOPROL-XL) 100 MG 24 hr tablet Take 1 tab daily    . potassium chloride (K-DUR) 10 MEQ tablet TAKE 1 TABLET ONCE DAILY. 30 tablet 6  . warfarin (COUMADIN) 2 MG tablet ONLY ON SUN MON WED FRI AND SAT As directed    . warfarin (COUMADIN) 4 MG tablet Take 4 mg by mouth daily. ONLY ON TUE AND THURS     No current facility-administered medications for this visit.    Allergies:   Codeine    Social History:  The patient  reports that she has quit smoking. She does not have any smokeless tobacco history on file. She reports that she drinks about 1.2 oz of alcohol per week. She reports that she does not use illicit  drugs.   Family History:  The patient's family history includes Colon cancer in her father; Heart disease in her mother; Ulcers in her father.    ROS:  Please see the history of present illness.   Otherwise, review of systems are positive for exertional dyspnea..   All other systems are reviewed and negative.    PHYSICAL EXAM: VS:  BP 118/78 mmHg  Pulse 75  Ht _0  (1.626 m)  Wt 141 lb 12.8 oz (64.32 kg)  BMI 24.33 kg/m2 , BMI Body mass index is 24.33 kg/(m^2). GEN: Well nourished, well developed, in no acute distress HEENT: normal Neck: no JVD, carotid bruits, or masses Cardiac: IIRR; no murmurs, rubs, or gallops,no edema  Respiratory:  clear to auscultation bilaterally, normal work of breathing GI: soft, nontender, nondistended, + BS MS: no deformity or atrophy Skin: warm and dry, no rash Neuro:  Strength and sensation are intact Psych: euthymic mood, full affect   EKG:  EKG is ordered today. The ekg ordered today demonstrates atrial fibrillation with controlled ventricular rate at 75 bpm, left bundle branch block, left axis deviation no significant change compared to prior.   Recent Labs: 10/27/2013: BUN 20; Creatinine 1.1; Potassium 3.6; Pro B  Natriuretic peptide (BNP) 295.0*; Sodium 138    Lipid Panel No results found for: CHOL, TRIG, HDL, CHOLHDL, VLDL, LDLCALC, LDLDIRECT    Wt Readings from Last 3 Encounters:  07/09/14 141 lb 12.8 oz (64.32 kg)  01/08/14 148 lb (67.132 kg)  10/27/13 142 lb (64.411 kg)      Other studies Reviewed: Additional studies/ records that were reviewed today include: . Review of the above records demonstrates: High a BNP was 245.   ASSESSMENT AND PLAN:  Chronic atrial fibrillation: Rate control is not optimal. Add digoxin 0.0625 mg per day or 0.125 mg Monday Wednesday and Friday  Chronic anticoagulation: No bleeding  Chronic diastolic heart failure: No overt evidence of volume overload  Essential hypertension:  Controlled     Current medicines are reviewed at length with the patient today.  The patient does not have concerns regarding medicines.  The following changes have been made:  Digoxin 0.0625 mg by mouth daily is added. We will do a BNP and basic metabolic panel in 0-35 days.  Labs/ tests ordered today include: HS  No orders of the defined types were placed in this encounter.     Disposition:   FU with HS in 8 month  Signed, Sinclair Grooms, MD  07/09/2014 3:45 PM    [facility] [place], [place], [place]  [address] Phone: [phone]; Fax: [phone]

## 2014-07-09 NOTE — Patient Instructions (Signed)
Medication Instructions:  START Digoxin 0.065m daily an Rx has been sent to your pharmacy  Labwork: Dig level, Bnp, Bmet in 1-2 weeks  Testing/Procedures: None   Follow-Up: .Your physician wants you to follow-up in: 6-9 months with Dr.Sith You will receive a reminder letter in the mail two months in advance. If you don't receive a letter, please call our office to schedule the follow-up appointment.   Any Other Special Instructions Will Be Listed Below (If Applicable).

## 2014-07-12 DIAGNOSIS — I480 Paroxysmal atrial fibrillation: Secondary | ICD-10-CM | POA: Diagnosis not present

## 2014-07-12 DIAGNOSIS — Z7901 Long term (current) use of anticoagulants: Secondary | ICD-10-CM | POA: Diagnosis not present

## 2014-07-19 ENCOUNTER — Other Ambulatory Visit: Payer: Self-pay | Admitting: Interventional Cardiology

## 2014-07-19 DIAGNOSIS — I482 Chronic atrial fibrillation: Secondary | ICD-10-CM | POA: Diagnosis not present

## 2014-07-19 DIAGNOSIS — I5032 Chronic diastolic (congestive) heart failure: Secondary | ICD-10-CM | POA: Diagnosis not present

## 2014-07-19 DIAGNOSIS — I10 Essential (primary) hypertension: Secondary | ICD-10-CM | POA: Diagnosis not present

## 2014-07-20 LAB — BASIC METABOLIC PANEL
BUN: 20 mg/dL (ref 6–23)
CALCIUM: 9.6 mg/dL (ref 8.4–10.5)
CO2: 32 meq/L (ref 19–32)
CREATININE: 0.86 mg/dL (ref 0.50–1.10)
Chloride: 95 mEq/L — ABNORMAL LOW (ref 96–112)
Glucose, Bld: 100 mg/dL — ABNORMAL HIGH (ref 70–99)
Potassium: 4.6 mEq/L (ref 3.5–5.3)
Sodium: 137 mEq/L (ref 135–145)

## 2014-07-20 LAB — BRAIN NATRIURETIC PEPTIDE: BRAIN NATRIURETIC PEPTIDE: 325.7 pg/mL — AB (ref 0.0–100.0)

## 2014-07-20 LAB — DIGOXIN LEVEL: DIGOXIN LVL: 0.8 ng/mL (ref 0.8–2.0)

## 2014-07-24 ENCOUNTER — Telehealth: Payer: Self-pay

## 2014-07-24 DIAGNOSIS — R0600 Dyspnea, unspecified: Secondary | ICD-10-CM

## 2014-07-25 NOTE — Telephone Encounter (Signed)
Pt aware of Dr.Smith's recommendations. Pt Needs pulmonary CT angiogram to r/o pulmonary embolism. Pt sts that she had a lung test ordered by her pcp Dr.Russo. Pt sts that the test was performed this year, she is not sure if it was a chest xray/CT. Pt did give me permission to discuss with her daughter. lmom for Deedie to call back

## 2014-07-25 NOTE — Telephone Encounter (Signed)
Needs pulmonary CT angiogram to r/o pulmonary embolism

## 2014-07-25 NOTE — Telephone Encounter (Signed)
-----  Message from Belva Crome, MD sent at 07/23/2014  7:10 PM EDT ----- Labs are acceptable. No change needed.

## 2014-07-25 NOTE — Telephone Encounter (Signed)
Pt aware of lab results. Labs are acceptable. No change needed.  Pt reports that she has not had any improvement with her exertional dyspnea. She denies swelling her weights have been stable. She denies any other symptoms She sts that she finds her self sob with simple activities.i.e making the bed. When she sits and rest she is ok. She denies palpitations or feeling like her heart is racing. She is tolerating digoxin ok, but has not had improvement of sob.  Adv pt that since her symptoms are stable and have not worsen, I will update Dr.Smith and f/u with her once he is back in the office. She should call the office before then if symptoms worsen. Pt agreeable and verbalized understanding.

## 2014-07-25 NOTE — Telephone Encounter (Signed)
Called pt pcp office Dr.Russo Left a message for Rodena Piety. Called to get recent testing (cxr,ct?)  Spoke with pt daughter.pt is agreeable to have CTA.  Pt daughter aware of CT appt on 5/27 _0  Verbal instructions given. No solid food 2 hours prior, drink plenty of fluids.

## 2014-07-27 ENCOUNTER — Ambulatory Visit (INDEPENDENT_AMBULATORY_CARE_PROVIDER_SITE_OTHER)
Admission: RE | Admit: 2014-07-27 | Discharge: 2014-07-27 | Disposition: A | Discharge status: Home / Self Care | Payer: Medicare Other | Source: Ambulatory Visit | Attending: Interventional Cardiology | Admitting: Interventional Cardiology

## 2014-07-27 DIAGNOSIS — R0600 Dyspnea, unspecified: Secondary | ICD-10-CM

## 2014-07-27 DIAGNOSIS — R0602 Shortness of breath: Secondary | ICD-10-CM | POA: Diagnosis not present

## 2014-07-27 DIAGNOSIS — J984 Other disorders of lung: Secondary | ICD-10-CM | POA: Diagnosis not present

## 2014-07-27 DIAGNOSIS — I4891 Unspecified atrial fibrillation: Secondary | ICD-10-CM | POA: Diagnosis not present

## 2014-07-27 MED ORDER — IOHEXOL 350 MG/ML SOLN
80.0000 mL | Freq: Once | INTRAVENOUS | Status: AC | PRN
  Administered 2014-07-27: 80 mL via INTRAVENOUS

## 2014-08-02 ENCOUNTER — Telehealth: Payer: Self-pay | Admitting: Interventional Cardiology

## 2014-08-02 DIAGNOSIS — I5032 Chronic diastolic (congestive) heart failure: Secondary | ICD-10-CM

## 2014-08-02 NOTE — Telephone Encounter (Signed)
New message      Talk to the nurse.  She would not tell me what she wanted.  Please call before 5 if possible

## 2014-08-02 NOTE — Telephone Encounter (Signed)
Returned pt call. Pt aware of CT results (No blot clots) with verbal understanding.   Pt sts that she has had increased sob since being started on digoxin. She usually exercises daily, and she has not been able to exercise lately due to dyspnea. She believes her increased sob is related to her starting digoxin. She has no other complaints. She would like to know if Dr.Smith could recommend an alternative medication. Adv her I will fwd him an update and call back with his recommendation. She verbalized understanding.

## 2014-08-02 NOTE — Telephone Encounter (Signed)
Stop digoxin. Increase furosemide 60 mg twice daily for 4 doses, then 80 mg per day. Basic metabolic panel in one week. Increase potassium to 2 tablets daily for one week then back to 1 per day.

## 2014-08-06 MED ORDER — FUROSEMIDE 40 MG PO TABS: 80.0000 mg | ORAL_TABLET | Freq: Every day | ORAL | Status: DC

## 2014-08-06 NOTE — Telephone Encounter (Signed)
Called to give pt Dr.Smith's recommendations. lmtcb

## 2014-08-06 NOTE — Telephone Encounter (Signed)
Pt aware of Dr.Smith's instruction below

## 2014-08-06 NOTE — Telephone Encounter (Signed)
Pt daughter aware of Dr.Smith's recommendations. Stop digoxin. Increase furosemide 60 mg twice daily for 4 doses, then 80 mg per day. . Increase potassium to 2 tablets daily for one week then back to 1 per day. Pt will have Bmet drawn on 6/10 @ the [facility] office. Pt daughter verbalized understanding.

## 2014-08-06 NOTE — Telephone Encounter (Signed)
Follow Up  Pt returned call/sr

## 2014-08-10 ENCOUNTER — Other Ambulatory Visit: Payer: Self-pay | Admitting: Interventional Cardiology

## 2014-08-10 DIAGNOSIS — I5032 Chronic diastolic (congestive) heart failure: Secondary | ICD-10-CM | POA: Diagnosis not present

## 2014-08-11 LAB — BASIC METABOLIC PANEL
BUN: 22 mg/dL (ref 6–23)
CHLORIDE: 93 meq/L — AB (ref 96–112)
CO2: 30 mEq/L (ref 19–32)
CREATININE: 1.05 mg/dL (ref 0.50–1.10)
Calcium: 9.7 mg/dL (ref 8.4–10.5)
GLUCOSE: 157 mg/dL — AB (ref 70–99)
Potassium: 3.7 mEq/L (ref 3.5–5.3)
SODIUM: 138 meq/L (ref 135–145)

## 2014-08-14 ENCOUNTER — Telehealth: Payer: Self-pay

## 2014-08-14 NOTE — Telephone Encounter (Signed)
Returned call. Deedie pt daughter is in the shower. She will have pt daughter call back when she gets out

## 2014-08-14 NOTE — Telephone Encounter (Signed)
Pt daughter sts that pt SOB has not improved with diuretic increase and d/c digoxin.  Pt denies any other symptoms. Pt O2 stats have been normal. Pt has noticed a marked decrease in her exercise tolerance. [place] She is not able  to do as much walking as she was able to do just 3 months ago. Adv pt I will fwd an update to Dr.Smith and call back with his recommendation. Pt and pt daughter verbalized understanding

## 2014-08-14 NOTE — Telephone Encounter (Signed)
I have no further instruction other than daily weights. We may need to cut the diarrhetic back if it causes her to feel worse. Let us maintain the current status for the time being.

## 2014-08-14 NOTE — Telephone Encounter (Signed)
-----  Message from Belva Crome, MD sent at 08/12/2014  7:32 PM EDT ----- Labs are stable. Has breathing improved on higher dose diuretic?

## 2014-08-14 NOTE — Telephone Encounter (Signed)
Follow UP  Pt daughter returning [place] phone call

## 2014-08-14 NOTE — Telephone Encounter (Signed)
Called pt daughter to give lab results and to get an update on pt sob. lmtcb

## 2014-08-15 NOTE — Telephone Encounter (Signed)
Pt daughter aware of Dr.Smith response. Pt and pt daughter agreeable and verbalized understanding.

## 2014-08-21 DIAGNOSIS — I480 Paroxysmal atrial fibrillation: Secondary | ICD-10-CM | POA: Diagnosis not present

## 2014-08-21 DIAGNOSIS — Z7901 Long term (current) use of anticoagulants: Secondary | ICD-10-CM | POA: Diagnosis not present

## 2014-08-27 ENCOUNTER — Other Ambulatory Visit: Payer: Self-pay

## 2014-08-30 ENCOUNTER — Other Ambulatory Visit: Payer: Self-pay

## 2014-08-30 MED ORDER — FUROSEMIDE 40 MG PO TABS: 80.0000 mg | ORAL_TABLET | Freq: Every day | ORAL | Status: DC

## 2014-08-30 MED ORDER — POTASSIUM CHLORIDE ER 10 MEQ PO TBCR: 10.0000 meq | EXTENDED_RELEASE_TABLET | Freq: Every day | ORAL | Status: DC

## 2014-09-10 ENCOUNTER — Other Ambulatory Visit: Payer: Self-pay

## 2014-09-10 ENCOUNTER — Other Ambulatory Visit: Payer: Self-pay | Admitting: Interventional Cardiology

## 2014-09-10 MED ORDER — LOSARTAN POTASSIUM 100 MG PO TABS: 100.0000 mg | ORAL_TABLET | Freq: Every day | ORAL | Status: DC

## 2014-09-13 ENCOUNTER — Telehealth: Payer: Self-pay | Admitting: Interventional Cardiology

## 2014-09-13 DIAGNOSIS — R0602 Shortness of breath: Secondary | ICD-10-CM

## 2014-09-13 DIAGNOSIS — I480 Paroxysmal atrial fibrillation: Secondary | ICD-10-CM | POA: Diagnosis not present

## 2014-09-13 DIAGNOSIS — Z7901 Long term (current) use of anticoagulants: Secondary | ICD-10-CM | POA: Diagnosis not present

## 2014-09-13 NOTE — Telephone Encounter (Signed)
New message     Pt states lasix has been doubled. Pt states having a little SOB when active.  .   Pt c/o Shortness Of Breath: STAT if SOB developed within the last 24 hours or pt is noticeably SOB on the phone  1. Are you currently SOB (can you hear that pt is SOB on the phone)? No  2. How long have you been experiencing SOB? 3 weeks   3. Are you SOB when sitting or when up moving around? Moving around  4. Are you currently experiencing any other symptoms? No and pt states no swelling  Please call to discuss

## 2014-09-13 NOTE — Telephone Encounter (Signed)
Returned pt call. Pt sts that her sob is unchanged. Pt denies any other symptoms, i.e.swelling, palpitations, chest pain. Pt weighs have been stable, pt ck her bp regularly and it has been normal. Pt has a pulse ox and reports her O2 stats are good. Pt sts that she does not notice a improvement in her sob since increasing Lasix. Pt is concerned that her sob could be coming from her lungs. She has a hx of pneumonia. Adv pt that Dr.Smith is currently out of the office for 2 weeks. Pt will f/u with her pcp, since her sob is stable she will await Dr.Smith's return to the office for further recommendation from him

## 2014-09-23 NOTE — Telephone Encounter (Signed)
Add spironolactone 25 mg daily. BMET 1 week. Schedule pulmonary function test.

## 2014-09-24 MED ORDER — SPIRONOLACTONE 25 MG PO TABS: 25.0000 mg | ORAL_TABLET | Freq: Every day | ORAL | Status: DC

## 2014-09-24 NOTE — Telephone Encounter (Signed)
Pt aware of Dr.Smith's recommendation. Add spironolactone 25 mg daily. BMET 1 week. Schedule pulmonary function test.  Rx sent to pt pharmacy. Pt rqst pharmacy deliver Rx to her home. Pt will go to the [facility] office on 8/1 for a bmet. Adv pt a scheduler from our office will call her to schedule her pft. Pt agreeable and verbalized understanding.

## 2014-10-02 ENCOUNTER — Telehealth: Payer: Self-pay | Admitting: Interventional Cardiology

## 2014-10-02 DIAGNOSIS — I5032 Chronic diastolic (congestive) heart failure: Secondary | ICD-10-CM

## 2014-10-02 NOTE — Telephone Encounter (Signed)
New message      Talk to [facility] regarding lab order.  Pt is going to [facility] and the order is incomplete

## 2014-10-02 NOTE — Telephone Encounter (Signed)
Returned call to pt daughter. Lab order for a Bmet is in [place], the resulting agency has been changed to [facility].

## 2014-10-03 ENCOUNTER — Other Ambulatory Visit: Payer: Self-pay | Admitting: Interventional Cardiology

## 2014-10-03 DIAGNOSIS — I5032 Chronic diastolic (congestive) heart failure: Secondary | ICD-10-CM | POA: Diagnosis not present

## 2014-10-04 ENCOUNTER — Telehealth: Payer: Self-pay

## 2014-10-04 LAB — BASIC METABOLIC PANEL
BUN: 23 mg/dL (ref 7–25)
CO2: 27 mmol/L (ref 20–31)
Calcium: 10.2 mg/dL (ref 8.6–10.4)
Chloride: 93 mmol/L — ABNORMAL LOW (ref 98–110)
Creat: 0.96 mg/dL — ABNORMAL HIGH (ref 0.60–0.88)
Glucose, Bld: 164 mg/dL — ABNORMAL HIGH (ref 65–99)
POTASSIUM: 4.8 mmol/L (ref 3.5–5.3)
SODIUM: 137 mmol/L (ref 135–146)

## 2014-10-04 NOTE — Telephone Encounter (Signed)
Pt aware of lab results. Labs are okay. Pt sts that she thinks  Her breathing is a little better. Pt is scheduled to have her PFT on 8/5. Pt sts that she has mistakenly been taking her K+38mq bid, instead of once daily. Adv her that her K+ lvels were ok according to her lab. She should reduce K+ to 17m as previously instructed. I will fwd an update to Dr.Smith. Pt verbalized understanding.

## 2014-10-04 NOTE — Telephone Encounter (Signed)
-----  Message from Belva Crome, MD sent at 10/04/2014  7:34 AM EDT ----- Labs are okay. Any improvement in breathing. Has PFT been done yet?

## 2014-10-05 ENCOUNTER — Ambulatory Visit (INDEPENDENT_AMBULATORY_CARE_PROVIDER_SITE_OTHER): Payer: Medicare Other | Admitting: Internal Medicine

## 2014-10-05 DIAGNOSIS — R0602 Shortness of breath: Secondary | ICD-10-CM

## 2014-10-05 LAB — PULMONARY FUNCTION TEST
DL/VA % pred: 75 %
DL/VA: 3.58 ml/min/mmHg/L
DLCO unc % pred: 47 %
DLCO unc: 11.11 ml/min/mmHg
FEF 25-75 POST: 0.44 L/s
FEF 25-75 Pre: 0.3 L/sec
FEF2575-%Change-Post: 45 %
FEF2575-%Pred-Post: 46 %
FEF2575-%Pred-Pre: 32 %
FEV1-%CHANGE-POST: 18 %
FEV1-%Pred-Post: 54 %
FEV1-%Pred-Pre: 46 %
FEV1-Post: 0.87 L
FEV1-Pre: 0.74 L
FEV1FVC-%Change-Post: 9 %
FEV1FVC-%Pred-Pre: 69 %
FEV6-%Change-Post: 10 %
FEV6-%PRED-POST: 77 %
FEV6-%Pred-Pre: 70 %
FEV6-Post: 1.57 L
FEV6-Pre: 1.43 L
FEV6FVC-%Change-Post: 1 %
FEV6FVC-%Pred-Post: 106 %
FEV6FVC-%Pred-Pre: 104 %
FVC-%Change-Post: 7 %
FVC-%PRED-PRE: 67 %
FVC-%Pred-Post: 73 %
FVC-PRE: 1.47 L
FVC-Post: 1.59 L
Post FEV1/FVC ratio: 55 %
Post FEV6/FVC ratio: 99 %
Pre FEV1/FVC ratio: 50 %
Pre FEV6/FVC Ratio: 97 %
RV % pred: 119 %
RV: 3.03 L
TLC % pred: 91 %
TLC: 4.56 L

## 2014-10-05 NOTE — Progress Notes (Signed)
PFT done today. 

## 2014-10-10 ENCOUNTER — Telehealth: Payer: Self-pay

## 2014-10-10 DIAGNOSIS — R0609 Other forms of dyspnea: Secondary | ICD-10-CM

## 2014-10-10 DIAGNOSIS — J449 Chronic obstructive pulmonary disease, unspecified: Secondary | ICD-10-CM

## 2014-10-10 NOTE — Telephone Encounter (Signed)
Pt and pt daughter aware of pft results and Dr.Smith's recommendation. Severe abnormality of PFT. Please refer to Pulmonary with diagnosis of "COPD, Restrictive lung disease, abnormal PFT, and DOE" Pt rqst a ref to Dr.Byrum. Adv pt a scheduler will call her to schedule. Pt and pt daughter verbalized understanding.

## 2014-10-10 NOTE — Telephone Encounter (Signed)
-----  Message from Belva Crome, MD sent at 10/06/2014  5:30 PM EDT ----- Severe abnormality of PFT. Please refer to Pulmonary with diagnosis of "COPD, Restrictive lung disease, abnormal PFT, and DOE"

## 2014-10-17 DIAGNOSIS — I480 Paroxysmal atrial fibrillation: Secondary | ICD-10-CM | POA: Diagnosis not present

## 2014-10-17 DIAGNOSIS — Z7901 Long term (current) use of anticoagulants: Secondary | ICD-10-CM | POA: Diagnosis not present

## 2014-11-06 ENCOUNTER — Ambulatory Visit (INDEPENDENT_AMBULATORY_CARE_PROVIDER_SITE_OTHER): Payer: Medicare Other | Admitting: Emergency Medicine

## 2014-11-06 ENCOUNTER — Encounter: Payer: Self-pay | Admitting: Emergency Medicine

## 2014-11-06 VITALS — BP 90/60 | HR 71 | Ht 64.0 in | Wt 140.4 lb

## 2014-11-06 DIAGNOSIS — R0609 Other forms of dyspnea: Secondary | ICD-10-CM | POA: Diagnosis not present

## 2014-11-06 DIAGNOSIS — I951 Orthostatic hypotension: Secondary | ICD-10-CM | POA: Diagnosis not present

## 2014-11-06 MED ORDER — ALBUTEROL SULFATE HFA 108 (90 BASE) MCG/ACT IN AERS: 2.0000 | INHALATION_SPRAY | RESPIRATORY_TRACT | Status: DC | PRN

## 2014-11-06 NOTE — Progress Notes (Signed)
Subjective:    Patient ID: [name], female    DOB: Jul 09, 1924, 81 y.o.   MRN: [id number]  HPI 81 year old woman, remote history of small tobacco use (3 pack years), with a history of hypertension, atrial fibrillation, and breast cancer for which she received lumpectomy and XRT on L..  She is referred for evaluation of dyspnea, has been slowly progressive over 5 yrs. Her daughter is here with her and assists with history giving. More precipitous shortness of breath over the last 6 months, even more so over the last 3. She is unable to move from room to room without stopping to rest.   Of note she has been weaker and was having more exertional SOB since her losartan was started. She gets dizzy going from sitting to standing.  She doesn't feel any palpitations or tachycardia when she exerts. No significant cough or wheeze.   I have personally reviewed CT chest from 07/27/14 that shows no evidence of pulmonary embolism, no significant parenchymal infiltrates.  I reviewed the report from her TTE 10/31/13 which showed biatrial dilation, normal LV systolic function, and an estimated peak PA systolic pressure of 50 mmHg, consistent with secondary pulmonary hypertension I personally reviewed her PFT from 10/05/14 that showed severe obstructive lung disease with a positive bronchodilator response, normal volumes consistent with pseudonormalization and probable coexisting restriction, decreased diffusion capacity   Copies to [place]  Review of Systems  Constitutional: Negative for fever, chills and unexpected weight change.  HENT: Negative for congestion, dental problem, ear pain, nosebleeds, postnasal drip, rhinorrhea, sinus pressure, sneezing, sore throat, trouble swallowing and voice change.   Eyes: Negative for visual disturbance.  Respiratory: Positive for cough, shortness of breath and wheezing. Negative for choking.   Cardiovascular: Positive for chest pain. Negative for leg swelling.    Gastrointestinal: Negative for vomiting, abdominal pain and diarrhea.  Genitourinary: Negative for difficulty urinating.  Musculoskeletal: Negative for arthralgias.  Skin: Negative for rash.  Neurological: Negative for tremors, syncope and headaches.  Hematological: Does not bruise/bleed easily.    Past Medical History  Diagnosis Date  . Breast cancer     left   . Hypertension   . A-fib      Family History  Problem Relation Age of Onset  . Heart disease Mother   . Ulcers Father   . Colon cancer Father   . Asthma Sister   . Asthma Son      Social History   Social History  . Marital Status: Married    Spouse Name: N/A  . Number of Children: N/A  . Years of Education: N/A   Occupational History  . Not on file.   Social History Main Topics  . Smoking status: Former Smoker -- 0.20 packs/day for 5 years    Types: Cigarettes  . Smokeless tobacco: Not on file     Comment: pt has not smoked in 50 years  . Alcohol Use: 1.2 oz/week    2 Glasses of wine per week  . Drug Use: No  . Sexual Activity: Not on file   Other Topics Concern  . Not on file   Social History Narrative     Allergies  Allergen Reactions  . Codeine Nausea Only    Nausea      Outpatient Prescriptions Prior to Visit  Medication Sig Dispense Refill  . ALPRAZolam (XANAX) 0.5 MG tablet 1 tab at night    . amLODipine (NORVASC) 2.5 MG tablet Take 1 tab daily    .  aspirin 81 MG tablet Take 81 mg by mouth daily.    . Calcium Carbonate-Vit D-Min (CALCIUM 1200 PO) Take 1 tablet by mouth daily.     . furosemide (LASIX) 40 MG tablet Take 2 tablets (80 mg total) by mouth daily. 60 tablet 10  . losartan (COZAAR) 100 MG tablet Take 1 tablet (100 mg total) by mouth daily. 30 tablet 6  . metoprolol succinate (TOPROL-XL) 100 MG 24 hr tablet Take 1 tab daily    . potassium chloride (K-DUR) 10 MEQ tablet Take 1 tablet (10 mEq total) by mouth daily. 30 tablet 10  . spironolactone (ALDACTONE) 25 MG tablet Take 1  tablet (25 mg total) by mouth daily. 30 tablet 5  . digoxin (LANOXIN) 0.125 MG tablet Take 0.5 tablets (0.0625 mg total) by mouth daily. (Patient not taking: Reported on 11/06/2014) 15 tablet 11  . warfarin (COUMADIN) 2 MG tablet ONLY ON SUN MON WED FRI AND SAT As directed    . warfarin (COUMADIN) 4 MG tablet Take 4 mg by mouth daily. ONLY ON TUE AND THURS     No facility-administered medications prior to visit.         Objective:   Physical Exam Filed Vitals:   11/06/14 1543  BP: 90/60  Pulse: 71  Height: 5' 4" (1.626 m)  Weight: 140 lb 6.4 oz (63.685 kg)  SpO2: 97%   Gen: Pleasant, well-nourished, in no distress,  normal affect  ENT: No lesions,  mouth clear,  oropharynx clear, no postnasal drip  Neck: No JVD, no TMG, no carotid bruits  Lungs: No use of accessory muscles, no dullness to percussion, clear without rales or rhonchi  Cardiovascular: RRR, heart sounds normal, no murmur or gallops, no peripheral edema  Musculoskeletal: No deformities, no cyanosis or clubbing  Neuro: alert, non focal  Skin: Warm, no lesions or rashes      Assessment & Plan:  Dyspnea on exertion The differential diagnosis here is broad. With regard to her more acute dyspnea,  certainly must consider relative hypotension given her complaints of orthostasis. Consider also silent tachycardia given her atrial fibrillation. She did not become tachycardic or hypoxic on ambulation today but she may benefit at some point from an event monitor. Pulmonary function testing is consistent with severe obstruction and mixed disease. I think that the obstructive lung disease represents a fixed asthmatic type of COPD. This is not new, that is to say that it did not happen suddenly. It probably represents a background limitation on her functional capacity and explains much of her change over the last 5-8 years. I believe that there is the potential here that we can treat her with bronchodilators and recoup some of her  function, but it doesn't necessarily explain why she has changed precipitously over the last months. As mentioned she did not desaturate with ambulation which makes it unlikely that functionally limiting pulmonary hypertension is responsible.   I would like to treat her empirically with albuterol when necessary, see if she benefits.  I will also temporarily stop her losartan as above, see if this helps with her dizziness and functional capacity She may need an event monitor to confirm that she is not having silent RVR w exertion.   Orthostasis I have asked her to at least temporarily stop her losartan to see if this changes her orthostasis, dyspnea or functional capacity. We will need to review this change with Drs. Tamala Julian and Virgina Jock

## 2014-11-06 NOTE — Patient Instructions (Signed)
Walking oximetry today.  Please stop losartan. Call our office or Dr Thompson Caul office if you continue to have episodes of light-headedness when you stand.  We will try using albuterol 2 puffs up to every four hours before exertion or to treat shortness of breath.  Follow with Dr Lamonte Sakai in 1 month

## 2014-11-06 NOTE — Assessment & Plan Note (Signed)
The differential diagnosis here is broad. With regard to her more acute dyspnea,  certainly must consider relative hypotension given her complaints of orthostasis. Consider also silent tachycardia given her atrial fibrillation. She did not become tachycardic or hypoxic on ambulation today but she may benefit at some point from an event monitor. Pulmonary function testing is consistent with severe obstruction and mixed disease. I think that the obstructive lung disease represents a fixed asthmatic type of COPD. This is not new, that is to say that it did not happen suddenly. It probably represents a background limitation on her functional capacity and explains much of her change over the last 5-8 years. I believe that there is the potential here that we can treat her with bronchodilators and recoup some of her function, but it doesn't necessarily explain why she has changed precipitously over the last months. As mentioned she did not desaturate with ambulation which makes it unlikely that functionally limiting pulmonary hypertension is responsible.   I would like to treat her empirically with albuterol when necessary, see if she benefits.  I will also temporarily stop her losartan as above, see if this helps with her dizziness and functional capacity She may need an event monitor to confirm that she is not having silent RVR w exertion.

## 2014-11-06 NOTE — Assessment & Plan Note (Signed)
I have asked her to at least temporarily stop her losartan to see if this changes her orthostasis, dyspnea or functional capacity. We will need to review this change with Drs. Tamala Julian and Virgina Jock

## 2014-11-07 ENCOUNTER — Telehealth: Payer: Self-pay | Admitting: Emergency Medicine

## 2014-11-07 NOTE — Telephone Encounter (Signed)
Spoke with the pt  She states calling to let RB know that she had already stopped taking losartan  I advised we will forward this to him as FYI, but she needs to let PCP know if she is still having light headedness when she stands  Pt verbalized understanding

## 2014-11-07 NOTE — Telephone Encounter (Signed)
Thanks

## 2014-11-22 DIAGNOSIS — Z23 Encounter for immunization: Secondary | ICD-10-CM | POA: Diagnosis not present

## 2014-11-22 DIAGNOSIS — I480 Paroxysmal atrial fibrillation: Secondary | ICD-10-CM | POA: Diagnosis not present

## 2014-11-22 DIAGNOSIS — Z7901 Long term (current) use of anticoagulants: Secondary | ICD-10-CM | POA: Diagnosis not present

## 2014-12-14 ENCOUNTER — Ambulatory Visit (INDEPENDENT_AMBULATORY_CARE_PROVIDER_SITE_OTHER): Payer: Medicare Other | Admitting: Emergency Medicine

## 2014-12-14 ENCOUNTER — Encounter: Payer: Self-pay | Admitting: Emergency Medicine

## 2014-12-14 VITALS — BP 122/74 | HR 82 | Ht 62.0 in | Wt 142.0 lb

## 2014-12-14 DIAGNOSIS — R0609 Other forms of dyspnea: Secondary | ICD-10-CM | POA: Diagnosis not present

## 2014-12-14 NOTE — Patient Instructions (Signed)
Please keep your albuterol available to use 2 puffs up to every 4 hours if needed for shortness of breath. You may also want to use before exercise.  I encourage you to go back to your exercise routine.  Follow with Dr Lamonte Sakai as needed or in 1 year.

## 2014-12-14 NOTE — Assessment & Plan Note (Addendum)
Spirometry would suggest components of restriction and possibly fixed obstruction. She did not benefit from albuterol significantly. I do not believe she needs to be on scheduled bronchodilator at this time. I would not treat her with targeted therapy for pulmonary hypertension. She is not hypoxemic. I encouraged her to go back to her exercise routine and work on her deconditioning which may be the easiest aspect of this to modify. I will follow with her in one year. She will keep the albuterol available to use if needed but will not use on a schedule

## 2014-12-14 NOTE — Progress Notes (Signed)
Subjective:    Patient ID: [name], female    DOB: [birth year], 81 y.o.   MRN: [id number]  HPI 81 year old woman, remote history of small tobacco use (3 pack years), with a history of hypertension, atrial fibrillation, and breast cancer for which she received lumpectomy and XRT on L..  She is referred for evaluation of dyspnea, has been slowly progressive over 5 yrs. Her daughter is here with her and assists with history giving. More precipitous shortness of breath over the last 6 months, even more so over the last 3. She is unable to move from room to room without stopping to rest.   Of note she has been weaker and was having more exertional SOB since her losartan was started. She gets dizzy going from sitting to standing.  She doesn't feel any palpitations or tachycardia when she exerts. No significant cough or wheeze.   I have personally reviewed CT chest from 07/27/14 that shows no evidence of pulmonary embolism, no significant parenchymal infiltrates.  I reviewed the report from her TTE 10/31/13 which showed biatrial dilation, normal LV systolic function, and an estimated peak PA systolic pressure of 50 mmHg, consistent with secondary pulmonary hypertension I personally reviewed her PFT from 10/05/14 that showed severe obstructive lung disease with a positive bronchodilator response, normal volumes consistent with pseudonormalization and probable coexisting restriction, decreased diffusion capacity   Copies to [place] 12/14/14 -- follow-up visits for dyspnea. She has mixed disease with significant obstruction on spirometry which prompted me to do a trial of albuterol to see if she would benefit. She did not desaturate at her last visit.  She was able to use the albuterol reliably but didn't notice any significant improvement. She believes that she has lost stamina because she has not been doing any regular exercise.   Review of Systems  Constitutional: Negative for fever,  chills and unexpected weight change.  HENT: Negative for congestion, dental problem, ear pain, nosebleeds, postnasal drip, rhinorrhea, sinus pressure, sneezing, sore throat, trouble swallowing and voice change.   Eyes: Negative for visual disturbance.  Respiratory: Positive for cough, shortness of breath and wheezing. Negative for choking.   Cardiovascular: Positive for chest pain. Negative for leg swelling.  Gastrointestinal: Negative for vomiting, abdominal pain and diarrhea.  Genitourinary: Negative for difficulty urinating.  Musculoskeletal: Negative for arthralgias.  Skin: Negative for rash.  Neurological: Negative for tremors, syncope and headaches.  Hematological: Does not bruise/bleed easily.    Past Medical History  Diagnosis Date  . Breast cancer ([place])     left   . Hypertension   . A-fib [facility])      Family History  Problem Relation Age of Onset  . Heart disease Mother   . Ulcers Father   . Colon cancer Father   . Asthma Sister   . Asthma Son      Social History   Social History  . Marital Status: Married    Spouse Name: N/A  . Number of Children: N/A  . Years of Education: N/A   Occupational History  . Not on file.   Social History Main Topics  . Smoking status: Former Smoker -- 0.20 packs/day for 5 years    Types: Cigarettes  . Smokeless tobacco: Not on file     Comment: pt has not smoked in 50 years  . Alcohol Use: 1.2 oz/week    2 Glasses of wine per week  . Drug Use: No  . Sexual Activity: Not on  file   Other Topics Concern  . Not on file   Social History Narrative     Allergies  Allergen Reactions  . Codeine Nausea Only    Nausea      Outpatient Prescriptions Prior to Visit  Medication Sig Dispense Refill  . albuterol (PROVENTIL HFA;VENTOLIN HFA) 108 (90 BASE) MCG/ACT inhaler Inhale 2 puffs into the lungs every 4 (four) hours as needed for wheezing or shortness of breath. 1 Inhaler 6  . ALPRAZolam (XANAX) 0.5 MG tablet 1 tab at night      . amLODipine (NORVASC) 2.5 MG tablet Take 1 tab daily    . aspirin 81 MG tablet Take 81 mg by mouth daily.    . Calcium Carbonate-Vit D-Min (CALCIUM 1200 PO) Take 1 tablet by mouth daily.     . furosemide (LASIX) 40 MG tablet Take 2 tablets (80 mg total) by mouth daily. 60 tablet 10  . metoprolol succinate (TOPROL-XL) 100 MG 24 hr tablet Take 1 tab daily    . potassium chloride (K-DUR) 10 MEQ tablet Take 1 tablet (10 mEq total) by mouth daily. 30 tablet 10  . spironolactone (ALDACTONE) 25 MG tablet Take 1 tablet (25 mg total) by mouth daily. 30 tablet 5  . warfarin (COUMADIN) 2 MG tablet Take 2 mg by mouth daily.    . digoxin (LANOXIN) 0.125 MG tablet Take 0.5 tablets (0.0625 mg total) by mouth daily. (Patient not taking: Reported on 11/06/2014) 15 tablet 11  . losartan (COZAAR) 100 MG tablet Take 1 tablet (100 mg total) by mouth daily. (Patient not taking: Reported on 12/14/2014) 30 tablet 6   No facility-administered medications prior to visit.         Objective:   Physical Exam Filed Vitals:   12/14/14 1540  BP: 122/74  Pulse: 82  Height: _0  (1.575 m)  Weight: 142 lb (64.411 kg)  SpO2: 96%   Gen: Pleasant, well-nourished, in no distress,  normal affect  ENT: No lesions,  mouth clear,  oropharynx clear, no postnasal drip  Neck: No JVD, no TMG, no carotid bruits  Lungs: No use of accessory muscles, no dullness to percussion, clear without rales or rhonchi  Cardiovascular: RRR, heart sounds normal, no murmur or gallops, no peripheral edema  Musculoskeletal: No deformities, no cyanosis or clubbing  Neuro: alert, non focal  Skin: Warm, no lesions or rashes      Assessment & Plan:  Dyspnea on exertion Spirometry would suggest components of restriction and possibly fixed obstruction. She did not benefit from albuterol significantly. I do not believe she needs to be on scheduled bronchodilator at this time. I would not treat her with targeted therapy for pulmonary  hypertension. She is not hypoxemic. I encouraged her to go back to her exercise routine and work on her deconditioning which may be the easiest aspect of this to modify. I will follow with her in one year. She will keep the albuterol available to use if needed but will not use on a schedule

## 2014-12-25 ENCOUNTER — Encounter: Payer: Self-pay | Admitting: Interventional Cardiology

## 2014-12-25 ENCOUNTER — Ambulatory Visit (INDEPENDENT_AMBULATORY_CARE_PROVIDER_SITE_OTHER): Payer: Medicare Other | Admitting: Interventional Cardiology

## 2014-12-25 VITALS — BP 136/84 | HR 81 | Ht 64.0 in | Wt 145.2 lb

## 2014-12-25 DIAGNOSIS — R0602 Shortness of breath: Secondary | ICD-10-CM

## 2014-12-25 DIAGNOSIS — I482 Chronic atrial fibrillation, unspecified: Secondary | ICD-10-CM

## 2014-12-25 DIAGNOSIS — I5032 Chronic diastolic (congestive) heart failure: Secondary | ICD-10-CM

## 2014-12-25 DIAGNOSIS — I10 Essential (primary) hypertension: Secondary | ICD-10-CM

## 2014-12-25 DIAGNOSIS — J449 Chronic obstructive pulmonary disease, unspecified: Secondary | ICD-10-CM

## 2014-12-25 DIAGNOSIS — R0609 Other forms of dyspnea: Secondary | ICD-10-CM | POA: Diagnosis not present

## 2014-12-25 NOTE — Progress Notes (Signed)
Cardiology Office Note   Date:  12/25/2014   ID:  [name], DOB March 03, 1924, MRN [id number]  PCP:  [name], MD  Cardiologist:  Sinclair Grooms, MD   Chief Complaint  Patient presents with  . Shortness of Breath      History of Present Illness: [name] is a 81 y.o. female who presents for chronic atrial fibrillation, chronic anticoagulation, chronic diastolic heart failure, essential hypertension, chronic lung disease, and elderly and frail.  For quite some time now I have been attempting to manage dyspnea. My assumption has been chronic diastolic heart failure as the cause. Recent spirometry was significantly abnormal. I then sent her for pulmonary evaluation. Restrictive and obstructive abnormalities were found but no significant therapy was required. She is back now with the same complaint. We have used areas intensity diuretic regimens including spironolactone and high-dose furosemide with documented diuresis but no improvement in dyspnea.  She is back today. She is discontinued spironolactone on her own. Stopping the medication is not worsened dyspnea.  Past Medical History  Diagnosis Date  . Breast cancer ([place])     left   . Hypertension   . A-fib [facility])     Past Surgical History  Procedure Laterality Date  . Breast lumpectomy    . Abdominal hysterectomy       Current Outpatient Prescriptions  Medication Sig Dispense Refill  . albuterol (PROVENTIL HFA;VENTOLIN HFA) 108 (90 BASE) MCG/ACT inhaler Inhale 2 puffs into the lungs every 4 (four) hours as needed for wheezing or shortness of breath. 1 Inhaler 6  . ALPRAZolam (XANAX) 0.5 MG tablet Take 0.5 mg by mouth at bedtime.     Marland Kitchen amLODipine (NORVASC) 2.5 MG tablet Take 2.5 mg by mouth daily.     Marland Kitchen aspirin 81 MG tablet Take 81 mg by mouth daily.    . Calcium Carbonate-Vit D-Min (CALCIUM 1200 PO) Take 1 tablet by mouth daily.     . metoprolol succinate (TOPROL-XL) 100 MG 24 hr tablet Take 100 mg by  mouth daily.     . potassium chloride (K-DUR) 10 MEQ tablet Take 1 tablet (10 mEq total) by mouth daily. 30 tablet 10  . warfarin (COUMADIN) 2 MG tablet Take 2 mg by mouth daily.    . furosemide (LASIX) 40 MG tablet 2 tablets (50m) alternating with 1 tablet (41m by mouth daily     No current facility-administered medications for this visit.    Allergies:   Codeine    Social History:  The patient  reports that she has quit smoking. Her smoking use included Cigarettes. She has a 1 pack-year smoking history. She has never used smokeless tobacco. She reports that she drinks about 1.2 oz of alcohol per week. She reports that she does not use illicit drugs.   Family History:  The patient's family history includes Asthma in her sister and son; Colon cancer in her father; Heart disease in her mother; Ulcers in her father.    ROS:  Please see the history of present illness.   Otherwise, review of systems are positive for difficulty with gait and balance. Decreased desire to be physically active..   All other systems are reviewed and negative.    PHYSICAL EXAM: VS:  BP 136/84 mmHg  Pulse 81  Ht 5' 4" (1.626 m)  Wt 65.862 kg (145 lb 3.2 oz)  BMI 24.91 kg/m2  SpO2 98% , BMI Body mass index is 24.91 kg/(m^2). GEN: Well nourished, well developed, in no  acute distress HEENT: normal Neck: no JVD, carotid bruits, or masses Cardiac: IIRR.  There is  no murmur, rub, or gallop. There is no edema. Respiratory:  clear to auscultation bilaterally, normal work of breathing. GI: soft, nontender, nondistended, + BS MS: no deformity or atrophy Skin: warm and dry, no rash Neuro:  Strength and sensation are intact Psych: euthymic mood, full affect   EKG:  EKG is not ordered today.    Recent Labs: 10/03/2014: BUN 23; Creat 0.96*; Potassium 4.8; Sodium 137    Lipid Panel No results found for: CHOL, TRIG, HDL, CHOLHDL, VLDL, LDLCALC, LDLDIRECT    Wt Readings from Last 3 Encounters:  12/25/14  65.862 kg (145 lb 3.2 oz)  12/14/14 64.411 kg (142 lb)  11/06/14 63.685 kg (140 lb 6.4 oz)      Other studies Reviewed: Additional studies/ records that were reviewed today include: reviewed the CT scan, pulmonary note, and spirometry. The findings include pulmonary restrictive than obstructive defect not responsive to bronchodilator therapy..    ASSESSMENT AND PLAN:  1. DOE (dyspnea on exertion) Multifactorial and likely related to age, intrinsic restrictive/obstructive pulmonary process, diastolic heart failure, and physical deconditioning.  2. Chronic diastolic heart failure (HCC) No evidence of volume overload  3. Chronic atrial fibrillation (HCC) Control rate  4. Essential hypertension Controlled blood pressure  5. Chronic obstructive pulmonary disease, unspecified COPD, unspecified chronic bronchitis type As noted above both restrictive and obstructive component and severe abnormality on spirometry. Continuous bronchodilator therapy was not recommended.    Current medicines are reviewed at length with the patient today.  The patient has the following concerns regarding medicines: diuretic therapy was discussed and will likely decrease intensity unless it seems to make a difference with her breathing..  The following changes/actions have been instituted:    Stop spironolactone  Decrease furosemide40 mg alternating with 80 mg daily. Our goal ultimately be to get furosemide down to 40 mg daily.  Labs/ tests ordered today include:  No orders of the defined types were placed in this encounter.     Disposition:   FU with HS in 3 months  Signed, Sinclair Grooms, MD  12/25/2014 5:16 PM    [facility] [place], [place], [place]  [address] Phone: [phone]; Fax: [phone]

## 2014-12-25 NOTE — Patient Instructions (Addendum)
Medication Instructions:  Stop spironolactone.  Decrease lasix (furosemide) to 103m (2 tablets) alternating with 412m( 1 tablet)  daily.  Labwork: None today  Testing/Procedures: None today  Follow-Up: Your physician recommends that you schedule a follow-up appointment in: 4-6 months with Dr SmTamala Julian   Call our office and let Dr SmTamala Juliannow if you have swelling in your feet and legs since Dr SmTamala Julians stopping spironolactone today     If you need a refill on your cardiac medications before your next appointment, please call your pharmacy.

## 2014-12-26 DIAGNOSIS — Z7901 Long term (current) use of anticoagulants: Secondary | ICD-10-CM | POA: Diagnosis not present

## 2014-12-26 DIAGNOSIS — I480 Paroxysmal atrial fibrillation: Secondary | ICD-10-CM | POA: Diagnosis not present

## 2015-01-23 DIAGNOSIS — I480 Paroxysmal atrial fibrillation: Secondary | ICD-10-CM | POA: Diagnosis not present

## 2015-01-23 DIAGNOSIS — Z7901 Long term (current) use of anticoagulants: Secondary | ICD-10-CM | POA: Diagnosis not present

## 2015-02-26 DIAGNOSIS — Z7901 Long term (current) use of anticoagulants: Secondary | ICD-10-CM | POA: Diagnosis not present

## 2015-02-26 DIAGNOSIS — I480 Paroxysmal atrial fibrillation: Secondary | ICD-10-CM | POA: Diagnosis not present

## 2015-03-27 DIAGNOSIS — Z7901 Long term (current) use of anticoagulants: Secondary | ICD-10-CM | POA: Diagnosis not present

## 2015-03-27 DIAGNOSIS — I480 Paroxysmal atrial fibrillation: Secondary | ICD-10-CM | POA: Diagnosis not present

## 2015-05-01 DIAGNOSIS — I480 Paroxysmal atrial fibrillation: Secondary | ICD-10-CM | POA: Diagnosis not present

## 2015-05-01 DIAGNOSIS — Z7901 Long term (current) use of anticoagulants: Secondary | ICD-10-CM | POA: Diagnosis not present

## 2015-05-06 DIAGNOSIS — Z6825 Body mass index (BMI) 25.0-25.9, adult: Secondary | ICD-10-CM | POA: Diagnosis not present

## 2015-05-06 DIAGNOSIS — R05 Cough: Secondary | ICD-10-CM | POA: Diagnosis not present

## 2015-05-06 DIAGNOSIS — J449 Chronic obstructive pulmonary disease, unspecified: Secondary | ICD-10-CM | POA: Diagnosis not present

## 2015-05-15 DIAGNOSIS — Z7901 Long term (current) use of anticoagulants: Secondary | ICD-10-CM | POA: Diagnosis not present

## 2015-05-15 DIAGNOSIS — I480 Paroxysmal atrial fibrillation: Secondary | ICD-10-CM | POA: Diagnosis not present

## 2015-06-06 DIAGNOSIS — H01001 Unspecified blepharitis right upper eyelid: Secondary | ICD-10-CM | POA: Diagnosis not present

## 2015-06-06 DIAGNOSIS — Z0100 Encounter for examination of eyes and vision without abnormal findings: Secondary | ICD-10-CM | POA: Diagnosis not present

## 2015-06-06 DIAGNOSIS — H0012 Chalazion right lower eyelid: Secondary | ICD-10-CM | POA: Diagnosis not present

## 2015-06-06 DIAGNOSIS — H11003 Unspecified pterygium of eye, bilateral: Secondary | ICD-10-CM | POA: Diagnosis not present

## 2015-06-19 DIAGNOSIS — I480 Paroxysmal atrial fibrillation: Secondary | ICD-10-CM | POA: Diagnosis not present

## 2015-06-19 DIAGNOSIS — Z7901 Long term (current) use of anticoagulants: Secondary | ICD-10-CM | POA: Diagnosis not present

## 2015-07-15 ENCOUNTER — Encounter: Payer: Self-pay | Admitting: Interventional Cardiology

## 2015-07-15 ENCOUNTER — Ambulatory Visit (INDEPENDENT_AMBULATORY_CARE_PROVIDER_SITE_OTHER): Payer: Medicare Other | Admitting: Interventional Cardiology

## 2015-07-15 VITALS — BP 136/80 | HR 73 | Ht 64.0 in | Wt 143.6 lb

## 2015-07-15 DIAGNOSIS — I10 Essential (primary) hypertension: Secondary | ICD-10-CM

## 2015-07-15 DIAGNOSIS — I5032 Chronic diastolic (congestive) heart failure: Secondary | ICD-10-CM

## 2015-07-15 DIAGNOSIS — Z7901 Long term (current) use of anticoagulants: Secondary | ICD-10-CM

## 2015-07-15 DIAGNOSIS — I482 Chronic atrial fibrillation, unspecified: Secondary | ICD-10-CM

## 2015-07-15 DIAGNOSIS — R0600 Dyspnea, unspecified: Secondary | ICD-10-CM

## 2015-07-15 DIAGNOSIS — J449 Chronic obstructive pulmonary disease, unspecified: Secondary | ICD-10-CM

## 2015-07-15 LAB — CBC WITH DIFFERENTIAL/PLATELET
BASOS ABS: 89 {cells}/uL (ref 0–200)
Basophils Relative: 1 %
EOS ABS: 89 {cells}/uL (ref 15–500)
Eosinophils Relative: 1 %
HEMATOCRIT: 40.2 % (ref 35.0–45.0)
HEMOGLOBIN: 14 g/dL (ref 11.7–15.5)
LYMPHS ABS: 2670 {cells}/uL (ref 850–3900)
Lymphocytes Relative: 30 %
MCH: 34.3 pg — AB (ref 27.0–33.0)
MCHC: 34.8 g/dL (ref 32.0–36.0)
MCV: 98.5 fL (ref 80.0–100.0)
MPV: 9.4 fL (ref 7.5–12.5)
Monocytes Absolute: 801 cells/uL (ref 200–950)
Monocytes Relative: 9 %
NEUTROS ABS: 5251 {cells}/uL (ref 1500–7800)
NEUTROS PCT: 59 %
Platelets: 237 10*3/uL (ref 140–400)
RBC: 4.08 MIL/uL (ref 3.80–5.10)
RDW: 13.1 % (ref 11.0–15.0)
WBC: 8.9 10*3/uL (ref 3.8–10.8)

## 2015-07-15 LAB — COMPREHENSIVE METABOLIC PANEL
ALBUMIN: 3.7 g/dL (ref 3.6–5.1)
ALT: 11 U/L (ref 6–29)
AST: 18 U/L (ref 10–35)
Alkaline Phosphatase: 73 U/L (ref 33–130)
BUN: 16 mg/dL (ref 7–25)
CHLORIDE: 98 mmol/L (ref 98–110)
CO2: 27 mmol/L (ref 20–31)
Calcium: 9 mg/dL (ref 8.6–10.4)
Creat: 0.89 mg/dL — ABNORMAL HIGH (ref 0.60–0.88)
Glucose, Bld: 89 mg/dL (ref 65–99)
POTASSIUM: 3.6 mmol/L (ref 3.5–5.3)
Sodium: 141 mmol/L (ref 135–146)
TOTAL PROTEIN: 6.4 g/dL (ref 6.1–8.1)
Total Bilirubin: 1 mg/dL (ref 0.2–1.2)

## 2015-07-15 NOTE — Patient Instructions (Signed)
Medication Instructions:  Your physician recommends that you continue on your current medications as directed. Please refer to the Current Medication list given to you today.   Labwork: Bnp, Cbc, Cmet  Testing/Procedures: None ordered  Follow-Up: Your physician recommends that you schedule a follow-up appointment pending test results   Any Other Special Instructions Will Be Listed Below (If Applicable).     If you need a refill on your cardiac medications before your next appointment, please call your pharmacy.

## 2015-07-15 NOTE — Progress Notes (Signed)
Cardiology Office Note    Date:  07/15/2015   ID:  [name], DOB January 17, 1925, MRN [id number]  PCP:  [name], MD  Cardiologist: Sinclair Grooms, MD   Chief Complaint  Patient presents with  . Congestive Heart Failure    History of Present Illness:  [name] is a 82 y.o. female follow-up of chronic diastolic heart failure, chronic atrial fibrillation, chronic anticoagulation therapy, and essential hypertension.  The usual complaint is dyspnea getting worse. That is a complaint today. There is no orthopnea. She feels there is been no improvement with either diuresis or bronchodilator therapy. She has fixed obstructive and restrictive lung disease and echo demonstrated diastolic dysfunction. Previous aggressive diuresis has led to electrolyte disturbance. Despite fluid removal, dyspnea has not improved.  Past Medical History  Diagnosis Date  . Breast cancer ([place])     left   . Hypertension   . A-fib [facility])     Past Surgical History  Procedure Laterality Date  . Breast lumpectomy    . Abdominal hysterectomy      Current Medications: Outpatient Prescriptions Prior to Visit  Medication Sig Dispense Refill  . albuterol (PROVENTIL HFA;VENTOLIN HFA) 108 (90 BASE) MCG/ACT inhaler Inhale 2 puffs into the lungs every 4 (four) hours as needed for wheezing or shortness of breath. 1 Inhaler 6  . ALPRAZolam (XANAX) 0.5 MG tablet Take 0.5 mg by mouth at bedtime.     Marland Kitchen amLODipine (NORVASC) 2.5 MG tablet Take 2.5 mg by mouth daily.     Marland Kitchen aspirin 81 MG tablet Take 81 mg by mouth daily.    . Calcium Carbonate-Vit D-Min (CALCIUM 1200 PO) Take 1 tablet by mouth daily.     . furosemide (LASIX) 40 MG tablet 2 tablets (50m) alternating with 1 tablet (441m by mouth daily    . metoprolol succinate (TOPROL-XL) 100 MG 24 hr tablet Take 100 mg by mouth daily.     . potassium chloride (K-DUR) 10 MEQ tablet Take 1 tablet (10 mEq total) by mouth daily. 30 tablet 10  . warfarin  (COUMADIN) 2 MG tablet Take 2 mg by mouth daily.     No facility-administered medications prior to visit.     Allergies:   Codeine   Social History   Social History  . Marital Status: Married    Spouse Name: N/A  . Number of Children: N/A  . Years of Education: N/A   Social History Main Topics  . Smoking status: Former Smoker -- 0.20 packs/day for 5 years    Types: Cigarettes  . Smokeless tobacco: Never Used     Comment: pt has not smoked in 50 years  . Alcohol Use: 1.2 oz/week    2 Glasses of wine per week  . Drug Use: No  . Sexual Activity: Not Asked   Other Topics Concern  . None   Social History Narrative     Family History:  The patient's family history includes Asthma in her sister and son; Colon cancer in her father; Heart disease in her mother; Ulcers in her father.   ROS:   Please see the history of present illness.    Appetite is good. She is frustrated by inability to exert herself and be as active as she would like  All other systems reviewed and are negative.   PHYSICAL EXAM:   VS:  BP 136/80 mmHg  Pulse 73  Ht _0  (1.626 m)  Wt 143 lb 9.6 oz (65.137 kg)  BMI  24.64 kg/m2   GEN: Well nourished, well developed, in no acute distress HEENT: normal Neck: There is moderate elevation in the neck veins while sitting and mandibular JVD while lying at 45. There is no carotid bruits, or masses Cardiac: Irregularly irregularRR; no murmurs, rubs, or gallops,no edema . Respiratory:  clear to auscultation bilaterally, normal work of breathing GI: soft, nontender, nondistended, + BS MS: no deformity or atrophy Skin: warm and dry, no rash Neuro:  Alert and Oriented x 3, Strength and sensation are intact Psych: euthymic mood, full affect  Wt Readings from Last 3 Encounters:  07/15/15 143 lb 9.6 oz (65.137 kg)  12/25/14 145 lb 3.2 oz (65.862 kg)  12/14/14 142 lb (64.411 kg)      Studies/Labs Reviewed:   EKG:  EKG  Atrial fibrillation with controlled  ventricular response, left axis deviation, and otherwise unremarkable.  Recent Labs: 10/03/2014: BUN 23; Creat 0.96*; Potassium 4.8; Sodium 137   Lipid Panel No results found for: CHOL, TRIG, HDL, CHOLHDL, VLDL, LDLCALC, LDLDIRECT  Additional studies/ records that were reviewed today include:  Reviewed the pulmonary consultation note by Dr. Lamonte Sakai. Reviewed recent laboratory data. Most recent laboratory data is greater than 6 months old.    ASSESSMENT:    1. Chronic diastolic heart failure ([place])   2. Chronic atrial fibrillation ([facility])   3. Chronic anticoagulation   4. Essential hypertension   5. Chronic obstructive pulmonary disease, unspecified COPD, unspecified chronic bronchitis type   6. Dyspnea      PLAN:  In order of problems listed above:  1. Dyspnea continues to be a significant problem. There is no gross volume overload related to diastolic heart failure. I will do blood work. Neck veins are elevated and I will consider increasing diuretic intensity, but she is already on a fairly significant dose of furosemide for her age. 2. Control rate. No change in management strategy 3. Continue anticoagulation therapy with Coumadin 4. Blood pressure is well controlled. 5. Fixed obstructive and restrictive components based upon Dr. Agustina Caroli note. 6. The dyspnea is multifactorial. I have previously tried aggressive diuresis and caused azotemia. She continues to complain that quality of life is severely altered by dyspnea. Bronchodilator therapy has not significantly helped. We'll consider referring to pulmonary rehabilitation program if she has eligibility criteria.    Medication Adjustments/Labs and Tests Ordered: Current medicines are reviewed at length with the patient today.  Concerns regarding medicines are outlined above.  Medication changes, Labs and Tests ordered today are listed in the Patient Instructions below. Patient Instructions  Medication Instructions:  Your physician  recommends that you continue on your current medications as directed. Please refer to the Current Medication list given to you today.   Labwork: Bnp, Cbc, Cmet  Testing/Procedures: None ordered  Follow-Up: Your physician recommends that you schedule a follow-up appointment pending test results   Any Other Special Instructions Will Be Listed Below (If Applicable).     If you need a refill on your cardiac medications before your next appointment, please call your pharmacy.       Signed, Sinclair Grooms, MD  07/15/2015 3:58 PM    [facility] [place], [place], [place]  [address] Phone: [phone]; Fax: [phone]

## 2015-07-16 LAB — BRAIN NATRIURETIC PEPTIDE: Brain Natriuretic Peptide: 360.2 pg/mL — ABNORMAL HIGH (ref <100)

## 2015-07-24 ENCOUNTER — Telehealth: Payer: Self-pay

## 2015-07-24 NOTE — Telephone Encounter (Signed)
error 

## 2015-07-25 ENCOUNTER — Telehealth: Payer: Self-pay

## 2015-07-25 DIAGNOSIS — R0602 Shortness of breath: Secondary | ICD-10-CM

## 2015-07-25 MED ORDER — SPIRONOLACTONE 25 MG PO TABS: 25.0000 mg | ORAL_TABLET | Freq: Two times a day (BID) | ORAL | Status: DC

## 2015-07-25 NOTE — Telephone Encounter (Signed)
-----  Message from Belva Crome, MD sent at 07/24/2015  6:29 PM EDT ----- Regarding: RE: patients recent lab results i reviewed the patient's They are essentially unchanged compared to prior. No significant anemia. Normal kidney function. BNP is mildly elevated but unchanged from one year ago.  I recommend resuming spironolactone 25 mg twice daily. She needs to check a BMP7 days after initiation of therapy. ----- Message -----    From: Lamar Laundry, CMA    Sent: 07/24/2015  10:40 AM      To: Belva Crome, MD Subject: patients recent lab results                    Hey Dr.Smith,  This patients labs were resulted, but not reviewed. Could you review her labs and advise. At her o/v she was told that adjustments in her medications may be mad based in the results. Her daughter has sent me a f/u message.  Thanks, Lattie Haw

## 2015-07-25 NOTE — Telephone Encounter (Signed)
Pt aware of lab results and Dr.Smit's recommendation. Rx sent to pt pharmacy for Spironolactone 30m bid ([facility] pharmacy med deliver to pt's home) Pt to have bmet drawn 7 days after restarting medication. [facility]. pt agreeable with plan and verbalized understanding.

## 2015-07-30 DIAGNOSIS — I480 Paroxysmal atrial fibrillation: Secondary | ICD-10-CM | POA: Diagnosis not present

## 2015-07-30 DIAGNOSIS — Z7901 Long term (current) use of anticoagulants: Secondary | ICD-10-CM | POA: Diagnosis not present

## 2015-08-06 DIAGNOSIS — R0602 Shortness of breath: Secondary | ICD-10-CM | POA: Diagnosis not present

## 2015-08-07 LAB — BASIC METABOLIC PANEL
BUN: 13 mg/dL (ref 7–25)
CO2: 28 mmol/L (ref 20–31)
CREATININE: 0.88 mg/dL (ref 0.60–0.88)
Calcium: 9.5 mg/dL (ref 8.6–10.4)
Chloride: 94 mmol/L — ABNORMAL LOW (ref 98–110)
GLUCOSE: 153 mg/dL — AB (ref 65–99)
POTASSIUM: 4 mmol/L (ref 3.5–5.3)
Sodium: 136 mmol/L (ref 135–146)

## 2015-08-09 DIAGNOSIS — Z0000 Encounter for general adult medical examination without abnormal findings: Secondary | ICD-10-CM | POA: Diagnosis not present

## 2015-08-09 DIAGNOSIS — R0609 Other forms of dyspnea: Secondary | ICD-10-CM | POA: Diagnosis not present

## 2015-08-09 DIAGNOSIS — I5032 Chronic diastolic (congestive) heart failure: Secondary | ICD-10-CM | POA: Diagnosis not present

## 2015-08-09 DIAGNOSIS — Z6823 Body mass index (BMI) 23.0-23.9, adult: Secondary | ICD-10-CM | POA: Diagnosis not present

## 2015-08-09 DIAGNOSIS — D692 Other nonthrombocytopenic purpura: Secondary | ICD-10-CM | POA: Diagnosis not present

## 2015-08-09 DIAGNOSIS — I272 Other secondary pulmonary hypertension: Secondary | ICD-10-CM | POA: Diagnosis not present

## 2015-08-09 DIAGNOSIS — I130 Hypertensive heart and chronic kidney disease with heart failure and stage 1 through stage 4 chronic kidney disease, or unspecified chronic kidney disease: Secondary | ICD-10-CM | POA: Diagnosis not present

## 2015-08-09 DIAGNOSIS — R739 Hyperglycemia, unspecified: Secondary | ICD-10-CM | POA: Diagnosis not present

## 2015-08-09 DIAGNOSIS — Z7901 Long term (current) use of anticoagulants: Secondary | ICD-10-CM | POA: Diagnosis not present

## 2015-08-09 DIAGNOSIS — I480 Paroxysmal atrial fibrillation: Secondary | ICD-10-CM | POA: Diagnosis not present

## 2015-08-09 DIAGNOSIS — Z1389 Encounter for screening for other disorder: Secondary | ICD-10-CM | POA: Diagnosis not present

## 2015-08-15 ENCOUNTER — Other Ambulatory Visit: Payer: Self-pay | Admitting: Interventional Cardiology

## 2015-08-29 DIAGNOSIS — I480 Paroxysmal atrial fibrillation: Secondary | ICD-10-CM | POA: Diagnosis not present

## 2015-08-29 DIAGNOSIS — Z7901 Long term (current) use of anticoagulants: Secondary | ICD-10-CM | POA: Diagnosis not present

## 2015-08-31 ENCOUNTER — Other Ambulatory Visit: Payer: Self-pay | Admitting: Interventional Cardiology

## 2015-09-05 ENCOUNTER — Telehealth: Payer: Self-pay

## 2015-09-05 NOTE — Telephone Encounter (Signed)
----- Message from Belva Crome, MD sent at 09/05/2015  9:05 AM EDT ----- Regarding: RE: MOM I have no further recommendations other than try to stay as active as possible. ----- Message -----    From: Lamar Laundry, CMA    Sent: 09/04/2015   5:00 PM      To: Belva Crome, MD Subject: Melton Alar: MOM                                        Hey Dr.Smith, Pt stopped spironolactone 1-2 weeks ago on her own, patients daughter would like to know if you have any other recommendations.  Lattie Haw    ----- Message -----    From: Lawana Pai    Sent: 09/04/2015   4:46 PM      To: Lamar Laundry, CMA Subject: RE: MOM                                        She already stopped taking it!! Anything else? ----- Message -----    From: Lamar Laundry, CMA    Sent: 09/04/2015   4:06 PM      To: Forde Dandy Eagleton Subject: RE: MOM                                          ----- Message -----    From: Belva Crome, MD    Sent: 09/04/2015   8:42 AM      To: Lamar Laundry, CMA Subject: RE: MOM                                        If spironolactone is not helping, we sould stop it. ----- Message -----    From: Lamar Laundry, CMA    Sent: 09/04/2015   7:25 AM      To: Belva Crome, MD, Deedie C Sirmon Subject: RE: MOM                                        I will forward the update to Dr.Smith ----- Message -----    From: Lawana Pai    Sent: 09/02/2015   8:15 AM      To: Lamar Laundry, CMA Subject: RE: MOM                                        She said she couldn't tell any difference. She needs to move around more.  She will be the first to admit that she is better if she "can make herself walk".  I have tried to get her to try the chair exercises at [facility] and she has all kinds of excuses.  She says it's hard to make herself do anything when she can't breathe..After all, she is 16!!!! ----- Message -----    From: Lamar Laundry, CMA  Sent: 08/30/2015    3:21 PM      To: Forde Dandy Rhymes Subject: RE: MOM                                        Hey Deedie,  Her most recent labs were released to my chart. They were ok. Did she not have any improvement with her sob while on spironolactone?  ----- Message -----    From: Lawana Pai    Sent: 08/30/2015   2:29 PM      To: Izell [place] Parris-Godley, CMA Subject: MOM                                            Veva Holes - we did not hear anything back from the [facility] had but i wanted to let you know she stopped taking the spironolactone. She felt like it was making her dizzy and dehydrated.  [place] and she did have several dizzy spells and her breathing was horrible.  Her ankles don't appear to be swelling.  I am trying to get her to drink a little more water and to eat more at lunch.  I think she needs more calories.  She eats a couple of crackers with peanut butter at lunch.  Her appetite is not very good.  She's pretty stubborn!!! I keep encouraging her to try to go to the chair exercise class at [facility] but she has all kinds of excuses.  Just wonder if there is anything else we need to do....thanks lisa!!!

## 2015-10-03 DIAGNOSIS — I480 Paroxysmal atrial fibrillation: Secondary | ICD-10-CM | POA: Diagnosis not present

## 2015-10-03 DIAGNOSIS — Z7901 Long term (current) use of anticoagulants: Secondary | ICD-10-CM | POA: Diagnosis not present

## 2015-10-21 ENCOUNTER — Other Ambulatory Visit: Payer: Self-pay | Admitting: Interventional Cardiology

## 2015-11-12 DIAGNOSIS — N183 Chronic kidney disease, stage 3 (moderate): Secondary | ICD-10-CM | POA: Diagnosis not present

## 2015-11-12 DIAGNOSIS — I130 Hypertensive heart and chronic kidney disease with heart failure and stage 1 through stage 4 chronic kidney disease, or unspecified chronic kidney disease: Secondary | ICD-10-CM | POA: Diagnosis not present

## 2015-11-12 DIAGNOSIS — D539 Nutritional anemia, unspecified: Secondary | ICD-10-CM | POA: Diagnosis not present

## 2015-11-12 DIAGNOSIS — Z7901 Long term (current) use of anticoagulants: Secondary | ICD-10-CM | POA: Diagnosis not present

## 2015-11-12 DIAGNOSIS — Z23 Encounter for immunization: Secondary | ICD-10-CM | POA: Diagnosis not present

## 2015-11-12 DIAGNOSIS — I480 Paroxysmal atrial fibrillation: Secondary | ICD-10-CM | POA: Diagnosis not present

## 2015-11-12 DIAGNOSIS — I5032 Chronic diastolic (congestive) heart failure: Secondary | ICD-10-CM | POA: Diagnosis not present

## 2015-11-14 ENCOUNTER — Other Ambulatory Visit: Payer: Self-pay | Admitting: Interventional Cardiology

## 2015-12-11 DIAGNOSIS — Z7901 Long term (current) use of anticoagulants: Secondary | ICD-10-CM | POA: Diagnosis not present

## 2015-12-11 DIAGNOSIS — I480 Paroxysmal atrial fibrillation: Secondary | ICD-10-CM | POA: Diagnosis not present

## 2016-01-16 DIAGNOSIS — Z7901 Long term (current) use of anticoagulants: Secondary | ICD-10-CM | POA: Diagnosis not present

## 2016-01-16 DIAGNOSIS — I480 Paroxysmal atrial fibrillation: Secondary | ICD-10-CM | POA: Diagnosis not present

## 2016-01-22 ENCOUNTER — Ambulatory Visit: Payer: Medicare Other | Admitting: Emergency Medicine

## 2016-02-07 DIAGNOSIS — I130 Hypertensive heart and chronic kidney disease with heart failure and stage 1 through stage 4 chronic kidney disease, or unspecified chronic kidney disease: Secondary | ICD-10-CM | POA: Diagnosis not present

## 2016-02-07 DIAGNOSIS — I5032 Chronic diastolic (congestive) heart failure: Secondary | ICD-10-CM | POA: Diagnosis not present

## 2016-02-07 DIAGNOSIS — Z7901 Long term (current) use of anticoagulants: Secondary | ICD-10-CM | POA: Diagnosis not present

## 2016-02-07 DIAGNOSIS — D698 Other specified hemorrhagic conditions: Secondary | ICD-10-CM | POA: Diagnosis not present

## 2016-02-07 DIAGNOSIS — I480 Paroxysmal atrial fibrillation: Secondary | ICD-10-CM | POA: Diagnosis not present

## 2016-02-07 DIAGNOSIS — J449 Chronic obstructive pulmonary disease, unspecified: Secondary | ICD-10-CM | POA: Diagnosis not present

## 2016-02-07 DIAGNOSIS — Z6824 Body mass index (BMI) 24.0-24.9, adult: Secondary | ICD-10-CM | POA: Diagnosis not present

## 2016-03-12 DIAGNOSIS — Z7901 Long term (current) use of anticoagulants: Secondary | ICD-10-CM | POA: Diagnosis not present

## 2016-03-12 DIAGNOSIS — M79671 Pain in right foot: Secondary | ICD-10-CM | POA: Diagnosis not present

## 2016-03-12 DIAGNOSIS — I130 Hypertensive heart and chronic kidney disease with heart failure and stage 1 through stage 4 chronic kidney disease, or unspecified chronic kidney disease: Secondary | ICD-10-CM | POA: Diagnosis not present

## 2016-03-12 DIAGNOSIS — L0390 Cellulitis, unspecified: Secondary | ICD-10-CM | POA: Diagnosis not present

## 2016-03-12 DIAGNOSIS — Z6824 Body mass index (BMI) 24.0-24.9, adult: Secondary | ICD-10-CM | POA: Diagnosis not present

## 2016-03-12 DIAGNOSIS — M1000 Idiopathic gout, unspecified site: Secondary | ICD-10-CM | POA: Diagnosis not present

## 2016-03-24 DIAGNOSIS — M1000 Idiopathic gout, unspecified site: Secondary | ICD-10-CM | POA: Diagnosis not present

## 2016-03-24 DIAGNOSIS — I480 Paroxysmal atrial fibrillation: Secondary | ICD-10-CM | POA: Diagnosis not present

## 2016-03-24 DIAGNOSIS — Z7901 Long term (current) use of anticoagulants: Secondary | ICD-10-CM | POA: Diagnosis not present

## 2016-04-07 DIAGNOSIS — I480 Paroxysmal atrial fibrillation: Secondary | ICD-10-CM | POA: Diagnosis not present

## 2016-04-07 DIAGNOSIS — Z7901 Long term (current) use of anticoagulants: Secondary | ICD-10-CM | POA: Diagnosis not present

## 2016-04-21 DIAGNOSIS — Z6824 Body mass index (BMI) 24.0-24.9, adult: Secondary | ICD-10-CM | POA: Diagnosis not present

## 2016-04-21 DIAGNOSIS — I480 Paroxysmal atrial fibrillation: Secondary | ICD-10-CM | POA: Diagnosis not present

## 2016-04-21 DIAGNOSIS — Z7901 Long term (current) use of anticoagulants: Secondary | ICD-10-CM | POA: Diagnosis not present

## 2016-04-21 DIAGNOSIS — L0889 Other specified local infections of the skin and subcutaneous tissue: Secondary | ICD-10-CM | POA: Diagnosis not present

## 2016-04-21 DIAGNOSIS — I130 Hypertensive heart and chronic kidney disease with heart failure and stage 1 through stage 4 chronic kidney disease, or unspecified chronic kidney disease: Secondary | ICD-10-CM | POA: Diagnosis not present

## 2016-04-21 DIAGNOSIS — M1000 Idiopathic gout, unspecified site: Secondary | ICD-10-CM | POA: Diagnosis not present

## 2016-04-24 DIAGNOSIS — Z7901 Long term (current) use of anticoagulants: Secondary | ICD-10-CM | POA: Diagnosis not present

## 2016-04-24 DIAGNOSIS — I480 Paroxysmal atrial fibrillation: Secondary | ICD-10-CM | POA: Diagnosis not present

## 2016-05-01 DIAGNOSIS — I480 Paroxysmal atrial fibrillation: Secondary | ICD-10-CM | POA: Diagnosis not present

## 2016-05-01 DIAGNOSIS — L0390 Cellulitis, unspecified: Secondary | ICD-10-CM | POA: Diagnosis not present

## 2016-05-01 DIAGNOSIS — M1000 Idiopathic gout, unspecified site: Secondary | ICD-10-CM | POA: Diagnosis not present

## 2016-05-01 DIAGNOSIS — Z7901 Long term (current) use of anticoagulants: Secondary | ICD-10-CM | POA: Diagnosis not present

## 2016-05-01 DIAGNOSIS — L0889 Other specified local infections of the skin and subcutaneous tissue: Secondary | ICD-10-CM | POA: Diagnosis not present

## 2016-05-06 DIAGNOSIS — L97522 Non-pressure chronic ulcer of other part of left foot with fat layer exposed: Secondary | ICD-10-CM | POA: Diagnosis not present

## 2016-05-18 DIAGNOSIS — Z7901 Long term (current) use of anticoagulants: Secondary | ICD-10-CM | POA: Diagnosis not present

## 2016-05-18 DIAGNOSIS — Z6824 Body mass index (BMI) 24.0-24.9, adult: Secondary | ICD-10-CM | POA: Diagnosis not present

## 2016-05-18 DIAGNOSIS — I480 Paroxysmal atrial fibrillation: Secondary | ICD-10-CM | POA: Diagnosis not present

## 2016-05-18 DIAGNOSIS — H6121 Impacted cerumen, right ear: Secondary | ICD-10-CM | POA: Diagnosis not present

## 2016-05-21 DIAGNOSIS — L97522 Non-pressure chronic ulcer of other part of left foot with fat layer exposed: Secondary | ICD-10-CM | POA: Diagnosis not present

## 2016-05-31 DIAGNOSIS — S329XXA Fracture of unspecified parts of lumbosacral spine and pelvis, initial encounter for closed fracture: Secondary | ICD-10-CM

## 2016-05-31 HISTORY — DX: Fracture of unspecified parts of lumbosacral spine and pelvis, initial encounter for closed fracture: S32.9XXA

## 2016-06-08 DIAGNOSIS — L97522 Non-pressure chronic ulcer of other part of left foot with fat layer exposed: Secondary | ICD-10-CM | POA: Diagnosis not present

## 2016-06-15 ENCOUNTER — Emergency Department (HOSPITAL_COMMUNITY)
Admission: EM | Admit: 2016-06-15 | Discharge: 2016-06-15 | Disposition: A | Discharge status: Home / Self Care | Payer: Medicare Other | Attending: Emergency Medicine | Admitting: Emergency Medicine

## 2016-06-15 ENCOUNTER — Encounter (HOSPITAL_COMMUNITY): Payer: Self-pay | Admitting: Emergency Medicine

## 2016-06-15 ENCOUNTER — Emergency Department (HOSPITAL_COMMUNITY): Payer: Medicare Other

## 2016-06-15 DIAGNOSIS — W19XXXA Unspecified fall, initial encounter: Secondary | ICD-10-CM

## 2016-06-15 DIAGNOSIS — W1839XA Other fall on same level, initial encounter: Secondary | ICD-10-CM | POA: Insufficient documentation

## 2016-06-15 DIAGNOSIS — Y9289 Other specified places as the place of occurrence of the external cause: Secondary | ICD-10-CM | POA: Diagnosis not present

## 2016-06-15 DIAGNOSIS — S32511A Fracture of superior rim of right pubis, initial encounter for closed fracture: Secondary | ICD-10-CM | POA: Diagnosis not present

## 2016-06-15 DIAGNOSIS — I5032 Chronic diastolic (congestive) heart failure: Secondary | ICD-10-CM | POA: Insufficient documentation

## 2016-06-15 DIAGNOSIS — S279XXA Injury of unspecified intrathoracic organ, initial encounter: Secondary | ICD-10-CM | POA: Diagnosis not present

## 2016-06-15 DIAGNOSIS — S32591A Other specified fracture of right pubis, initial encounter for closed fracture: Secondary | ICD-10-CM | POA: Diagnosis not present

## 2016-06-15 DIAGNOSIS — S72009A Fracture of unspecified part of neck of unspecified femur, initial encounter for closed fracture: Secondary | ICD-10-CM | POA: Diagnosis not present

## 2016-06-15 DIAGNOSIS — Z79899 Other long term (current) drug therapy: Secondary | ICD-10-CM | POA: Diagnosis not present

## 2016-06-15 DIAGNOSIS — S62327A Displaced fracture of shaft of fifth metacarpal bone, left hand, initial encounter for closed fracture: Secondary | ICD-10-CM | POA: Diagnosis not present

## 2016-06-15 DIAGNOSIS — S62317A Displaced fracture of base of fifth metacarpal bone. left hand, initial encounter for closed fracture: Secondary | ICD-10-CM | POA: Insufficient documentation

## 2016-06-15 DIAGNOSIS — S329XXA Fracture of unspecified parts of lumbosacral spine and pelvis, initial encounter for closed fracture: Secondary | ICD-10-CM | POA: Diagnosis not present

## 2016-06-15 DIAGNOSIS — S0181XA Laceration without foreign body of other part of head, initial encounter: Secondary | ICD-10-CM | POA: Insufficient documentation

## 2016-06-15 DIAGNOSIS — Y9301 Activity, walking, marching and hiking: Secondary | ICD-10-CM | POA: Insufficient documentation

## 2016-06-15 DIAGNOSIS — Z7982 Long term (current) use of aspirin: Secondary | ICD-10-CM | POA: Insufficient documentation

## 2016-06-15 DIAGNOSIS — I110 Hypertensive heart disease with heart failure: Secondary | ICD-10-CM | POA: Insufficient documentation

## 2016-06-15 DIAGNOSIS — S59919D Unspecified injury of unspecified forearm, subsequent encounter: Secondary | ICD-10-CM | POA: Diagnosis not present

## 2016-06-15 DIAGNOSIS — S6992XA Unspecified injury of left wrist, hand and finger(s), initial encounter: Secondary | ICD-10-CM | POA: Diagnosis present

## 2016-06-15 DIAGNOSIS — S199XXA Unspecified injury of neck, initial encounter: Secondary | ICD-10-CM | POA: Diagnosis not present

## 2016-06-15 DIAGNOSIS — R51 Headache: Secondary | ICD-10-CM | POA: Diagnosis not present

## 2016-06-15 DIAGNOSIS — Z853 Personal history of malignant neoplasm of breast: Secondary | ICD-10-CM | POA: Insufficient documentation

## 2016-06-15 DIAGNOSIS — S299XXA Unspecified injury of thorax, initial encounter: Secondary | ICD-10-CM | POA: Diagnosis not present

## 2016-06-15 DIAGNOSIS — Y999 Unspecified external cause status: Secondary | ICD-10-CM | POA: Diagnosis not present

## 2016-06-15 DIAGNOSIS — Z87891 Personal history of nicotine dependence: Secondary | ICD-10-CM | POA: Insufficient documentation

## 2016-06-15 DIAGNOSIS — Z7901 Long term (current) use of anticoagulants: Secondary | ICD-10-CM | POA: Diagnosis not present

## 2016-06-15 DIAGNOSIS — R079 Chest pain, unspecified: Secondary | ICD-10-CM | POA: Diagnosis not present

## 2016-06-15 LAB — URINALYSIS, ROUTINE W REFLEX MICROSCOPIC
Bilirubin Urine: NEGATIVE
Glucose, UA: NEGATIVE mg/dL
Ketones, ur: NEGATIVE mg/dL
Leukocytes, UA: NEGATIVE
Nitrite: NEGATIVE
PROTEIN: NEGATIVE mg/dL
SPECIFIC GRAVITY, URINE: 1.019 (ref 1.005–1.030)
pH: 5 (ref 5.0–8.0)

## 2016-06-15 LAB — CBC WITH DIFFERENTIAL/PLATELET
BASOS ABS: 0 10*3/uL (ref 0.0–0.1)
Basophils Relative: 0 %
Eosinophils Absolute: 0 10*3/uL (ref 0.0–0.7)
Eosinophils Relative: 0 %
HEMATOCRIT: 36.9 % (ref 36.0–46.0)
Hemoglobin: 13 g/dL (ref 12.0–15.0)
LYMPHS PCT: 19 %
Lymphs Abs: 2.1 10*3/uL (ref 0.7–4.0)
MCH: 35.3 pg — AB (ref 26.0–34.0)
MCHC: 35.2 g/dL (ref 30.0–36.0)
MCV: 100.3 fL — AB (ref 78.0–100.0)
MONO ABS: 1 10*3/uL (ref 0.1–1.0)
MONOS PCT: 9 %
NEUTROS ABS: 8.2 10*3/uL — AB (ref 1.7–7.7)
Neutrophils Relative %: 72 %
Platelets: 207 10*3/uL (ref 150–400)
RBC: 3.68 MIL/uL — ABNORMAL LOW (ref 3.87–5.11)
RDW: 13.4 % (ref 11.5–15.5)
WBC: 11.4 10*3/uL — ABNORMAL HIGH (ref 4.0–10.5)

## 2016-06-15 LAB — COMPREHENSIVE METABOLIC PANEL
ALT: 12 U/L — ABNORMAL LOW (ref 14–54)
AST: 20 U/L (ref 15–41)
Albumin: 3.4 g/dL — ABNORMAL LOW (ref 3.5–5.0)
Alkaline Phosphatase: 84 U/L (ref 38–126)
Anion gap: 13 (ref 5–15)
BILIRUBIN TOTAL: 1.6 mg/dL — AB (ref 0.3–1.2)
BUN: 18 mg/dL (ref 6–20)
CO2: 29 mmol/L (ref 22–32)
Calcium: 9 mg/dL (ref 8.9–10.3)
Chloride: 95 mmol/L — ABNORMAL LOW (ref 101–111)
Creatinine, Ser: 0.98 mg/dL (ref 0.44–1.00)
GFR, EST AFRICAN AMERICAN: 57 mL/min — AB (ref >60)
GFR, EST NON AFRICAN AMERICAN: 49 mL/min — AB (ref >60)
Glucose, Bld: 116 mg/dL — ABNORMAL HIGH (ref 65–99)
POTASSIUM: 3.2 mmol/L — AB (ref 3.5–5.1)
Sodium: 137 mmol/L (ref 135–145)
TOTAL PROTEIN: 6.2 g/dL — AB (ref 6.5–8.1)

## 2016-06-15 LAB — I-STAT TROPONIN, ED: Troponin i, poc: 0.03 ng/mL (ref 0.00–0.08)

## 2016-06-15 LAB — PROTIME-INR
INR: 2.27
PROTHROMBIN TIME: 25.5 s — AB (ref 11.4–15.2)

## 2016-06-15 MED ORDER — TETANUS-DIPHTH-ACELL PERTUSSIS 5-2.5-18.5 LF-MCG/0.5 IM SUSP: 0.5000 mL | Freq: Once | INTRAMUSCULAR | Status: DC

## 2016-06-15 MED ORDER — HYDROCODONE-ACETAMINOPHEN 5-325 MG PO TABS
1.0000 | ORAL_TABLET | ORAL | 0 refills | Status: DC | PRN
Start: 2016-06-15 — End: 2017-03-25

## 2016-06-15 MED ORDER — POTASSIUM CHLORIDE CRYS ER 20 MEQ PO TBCR
40.0000 meq | EXTENDED_RELEASE_TABLET | Freq: Once | ORAL | Status: AC
  Administered 2016-06-15: 40 meq via ORAL
  Filled 2016-06-15: qty 2

## 2016-06-15 MED ORDER — HYDROCODONE-ACETAMINOPHEN 5-325 MG PO TABS
1.0000 | ORAL_TABLET | Freq: Once | ORAL | Status: AC
  Administered 2016-06-15: 1 via ORAL
  Filled 2016-06-15: qty 1

## 2016-06-15 NOTE — Discharge Instructions (Addendum)
Your imaging today showed a left finger (fifth finger) fracture and pelvis fracture. Follow-up as scheduled below.  It is VERY important to have physical therapy evaluate you as soon as you return to the facility.   Do not bear weight through the left hand but ok to bear weight through elbow.  Weight bear as tolerated through right leg.

## 2016-06-15 NOTE — ED Provider Notes (Addendum)
[facility] DEPT Provider Note   CSN: [id number] Arrival date & time: 06/15/16  1037     History   Chief Complaint Chief Complaint  Patient presents with  . Fall  . Head Laceration    takes Coumadin    HPI [name] is a 83 y.o. female.  HPI   83 yo F with PMHx of HTN, AFib, CHF, here with headache, right rib pain, and right hip pain s/p fall and possible syncopal episode. Pt states that 3 days ago, she was walking across the hall when she began to feel lightheaded. She then "almost passed out" and woke up on the floor. She remembers awakening, looking at the ceiling. She scraped her face and head on the way down and landed on her right side. Since then, she has had mild right facial pain and headache that is aching, throbbing. No neck pain. She also reports a mildly pleuritic right-sided chest wall pain that is worse with movement, palpation. She also endorses mild right hip pain that is worse with ambulation. No loss of bowel or bladder. No urinary symptoms. She feels generally tired as well, which is new from prior. She does have a h/o AFib but denies palpitations of chest pain.  Past Medical History:  Diagnosis Date  . A-fib ([place])   . Breast cancer ([place])    left   . Hypertension     Patient Active Problem List   Diagnosis Date Noted  . Dyspnea on exertion 11/06/2014  . Orthostasis 11/06/2014  . Chest tightness 06/13/2013  . Essential hypertension 06/13/2013  . Chronic diastolic heart failure ([place]) 06/13/2013  . Chronic atrial fibrillation ([place]) 06/13/2013  . Chronic anticoagulation 06/13/2013    Past Surgical History:  Procedure Laterality Date  . ABDOMINAL HYSTERECTOMY    . BREAST LUMPECTOMY      OB History    No data available       Home Medications    Prior to Admission medications   Medication Sig Start Date End Date Taking? Authorizing Provider  albuterol (PROVENTIL HFA;VENTOLIN HFA) 108 (90 BASE) MCG/ACT inhaler Inhale 2 puffs into the  lungs every 4 (four) hours as needed for wheezing or shortness of breath. 11/06/14   Collene Gobble, MD  ALPRAZolam Duanne Moron) 0.5 MG tablet Take 0.5 mg by mouth at bedtime.  05/18/13   Historical Provider, MD  amLODipine (NORVASC) 2.5 MG tablet Take 2.5 mg by mouth daily.  05/29/13   Historical Provider, MD  aspirin 81 MG tablet Take 81 mg by mouth daily.    Historical Provider, MD  Calcium Carbonate-Vit D-Min (CALCIUM 1200 PO) Take 1 tablet by mouth daily.     Historical Provider, MD  furosemide (LASIX) 40 MG tablet Take 2 tablets (80 mg total) by mouth daily. 10/21/15   Belva Crome, MD  HYDROcodone-acetaminophen (NORCO/VICODIN) 5-325 MG tablet Take 1 tablet by mouth every 4 (four) hours as needed for severe pain. 06/15/16   Jessica Bruce, MD  metoprolol succinate (TOPROL-XL) 100 MG 24 hr tablet Take 100 mg by mouth daily.  06/05/13   Historical Provider, MD  potassium chloride (K-DUR) 10 MEQ tablet Take 1 tablet (10 mEq total) by mouth daily. 11/14/15   Belva Crome, MD  spironolactone (ALDACTONE) 25 MG tablet Take 1 tablet (25 mg total) by mouth 2 (two) times daily. 07/25/15   Belva Crome, MD  warfarin (COUMADIN) 2 MG tablet Take 2 mg by mouth daily.    Historical Provider, MD  Family History Family History  Problem Relation Age of Onset  . Heart disease Mother   . Ulcers Father   . Colon cancer Father   . Asthma Sister   . Asthma Son     Social History Social History  Substance Use Topics  . Smoking status: Former Smoker    Packs/day: 0.20    Years: 5.00    Types: Cigarettes  . Smokeless tobacco: Never Used     Comment: pt has not smoked in 50 years  . Alcohol use 1.2 oz/week    2 Glasses of wine per week     Allergies   Codeine   Review of Systems Review of Systems  Constitutional: Positive for fatigue. Negative for chills and fever.  HENT: Positive for facial swelling. Negative for congestion and rhinorrhea.   Eyes: Negative for visual disturbance.  Respiratory:  Positive for shortness of breath. Negative for cough and wheezing.   Cardiovascular: Positive for chest pain. Negative for leg swelling.  Gastrointestinal: Negative for abdominal pain, diarrhea, nausea and vomiting.  Genitourinary: Negative for dysuria and flank pain.  Musculoskeletal: Positive for arthralgias and gait problem. Negative for neck pain and neck stiffness.  Skin: Positive for wound. Negative for rash.  Allergic/Immunologic: Negative for immunocompromised state.  Neurological: Negative for syncope, weakness and headaches.  All other systems reviewed and are negative.    Physical Exam Updated Vital Signs BP 111/72 (BP Location: Right Arm)   Pulse 69   Temp 97.9 F (36.6 C) (Oral)   Resp 18   Wt 143 lb (64.9 kg)   SpO2 94%   BMI 24.55 kg/m   Physical Exam  Constitutional: She is oriented to person, place, and time. She appears well-developed and well-nourished. No distress.  HENT:  Head: Normocephalic and atraumatic.  Superficial abrasion and laceration to right face, healing. No facial deformity.  Eyes: Conjunctivae are normal.  Neck: Neck supple.  Cardiovascular: Normal rate, regular rhythm and normal heart sounds.  Exam reveals no friction rub.   No murmur heard. Pulmonary/Chest: Effort normal and breath sounds normal. No respiratory distress. She has no wheezes. She has no rales.  Abdominal: She exhibits no distension.  Musculoskeletal: She exhibits no edema.  TTP over right greater trochanter. No deformity. No bruising.  Neurological: She is alert and oriented to person, place, and time. She exhibits normal muscle tone.  Skin: Skin is warm. Capillary refill takes less than 2 seconds.  Bruising to left hand. No significant bony TTP. No deformity.  Psychiatric: She has a normal mood and affect.  Nursing note and vitals reviewed.    ED Treatments / Results  Labs (all labs ordered are listed, but only abnormal results are displayed) Labs Reviewed  CBC WITH  DIFFERENTIAL/PLATELET - Abnormal; Notable for the following:       Result Value   WBC 11.4 (*)    RBC 3.68 (*)    MCV 100.3 (*)    MCH 35.3 (*)    Neutro Abs 8.2 (*)    All other components within normal limits  COMPREHENSIVE METABOLIC PANEL - Abnormal; Notable for the following:    Potassium 3.2 (*)    Chloride 95 (*)    Glucose, Bld 116 (*)    Total Protein 6.2 (*)    Albumin 3.4 (*)    ALT 12 (*)    Total Bilirubin 1.6 (*)    GFR calc non Af Amer 49 (*)    GFR calc Af Amer 57 (*)  All other components within normal limits  URINALYSIS, ROUTINE W REFLEX MICROSCOPIC - Abnormal; Notable for the following:    APPearance HAZY (*)    Hgb urine dipstick MODERATE (*)    Bacteria, UA RARE (*)    Squamous Epithelial / LPF 6-30 (*)    Non Squamous Epithelial 0-5 (*)    All other components within normal limits  PROTIME-INR - Abnormal; Notable for the following:    Prothrombin Time 25.5 (*)    All other components within normal limits  I-STAT TROPOININ, ED    EKG  EKG Interpretation  Date/Time:  Monday June 15 2016 15:45:38 EDT Ventricular Rate:  70 PR Interval:    QRS Duration: 138 QT Interval:  466 QTC Calculation: 503 R Axis:   -64 Text Interpretation:  Atrial fibrillation Nonspecific IVCD with LAD LVH with secondary repolarization abnormality No significant change since last tracing Confirmed by Raydin Bielinski MD, Dannie Hattabaugh [phone]) on 06/15/2016 3:48:22 PM       Radiology Dg Chest 2 View  Result Date: 06/15/2016 CLINICAL DATA:  Fall 3 days ago with right-sided chest pain, initial encounter EXAM: CHEST  2 VIEW COMPARISON:  07/27/2014 FINDINGS: Cardiac shadow is mildly enlarged. Aortic calcifications are noted. The lungs are well aerated bilaterally. No acute bony abnormality is seen. No focal infiltrates are noted. Degenerative changes of the acromioclavicular joints are seen bilaterally. IMPRESSION: No acute abnormality noted. Electronically Signed   By: Inez Catalina M.D.   On:  06/15/2016 12:17   Ct Head Wo Contrast  Result Date: 06/15/2016 CLINICAL DATA:  Fall 3 days ago.  Headache.  Initial encounter. EXAM: CT HEAD WITHOUT CONTRAST CT CERVICAL SPINE WITHOUT CONTRAST TECHNIQUE: Multidetector CT imaging of the head and cervical spine was performed following the standard protocol without intravenous contrast. Multiplanar CT image reconstructions of the cervical spine were also generated. COMPARISON:  None. FINDINGS: CT HEAD FINDINGS Brain: There is no evidence of acute cortical infarct, intracranial hemorrhage, mass, midline shift, or extra-axial fluid collection. Mild cerebral atrophy is within normal limits for age. Patchy bilateral cerebral white matter hypodensities are nonspecific but compatible with chronic small vessel ischemic disease, mild for age. Vascular: Calcified atherosclerosis at the skullbase. No hyperdense vessel. Skull: No fracture or focal osseous lesion. Sinuses/Orbits: Visualized paranasal sinuses and mastoid air cells are clear. Prior bilateral cataract extraction. Other: None. CT CERVICAL SPINE FINDINGS Alignment: Minimal anterolisthesis of C3 on C4 and C4 on C5, likely degenerative and facet mediated. Skull base and vertebrae: No acute fracture or destructive osseous process identified. Soft tissues and spinal canal: No prevertebral fluid or swelling. No visible canal hematoma. Disc levels: Advanced disc degeneration at C6-7 with severe disc space narrowing. Bilateral posterior element ankylosis as well as partial interbody ankylosis from C4-C6. Advanced facet arthrosis bilaterally at C2-3 and C3-4 and on the left at C6-7. No high-grade spinal stenosis or high-grade osseous neural foraminal stenosis. Upper chest: Unremarkable. Other: None. IMPRESSION: 1. No evidence of acute intracranial abnormality. 2. Mild chronic small vessel ischemic disease. 3. No evidence of acute cervical spine fracture or traumatic subluxation. Electronically Signed   By: Logan Bores  M.D.   On: 06/15/2016 12:15   Ct Cervical Spine Wo Contrast  Result Date: 06/15/2016 CLINICAL DATA:  Fall 3 days ago.  Headache.  Initial encounter. EXAM: CT HEAD WITHOUT CONTRAST CT CERVICAL SPINE WITHOUT CONTRAST TECHNIQUE: Multidetector CT imaging of the head and cervical spine was performed following the standard protocol without intravenous contrast. Multiplanar CT image reconstructions of the  cervical spine were also generated. COMPARISON:  None. FINDINGS: CT HEAD FINDINGS Brain: There is no evidence of acute cortical infarct, intracranial hemorrhage, mass, midline shift, or extra-axial fluid collection. Mild cerebral atrophy is within normal limits for age. Patchy bilateral cerebral white matter hypodensities are nonspecific but compatible with chronic small vessel ischemic disease, mild for age. Vascular: Calcified atherosclerosis at the skullbase. No hyperdense vessel. Skull: No fracture or focal osseous lesion. Sinuses/Orbits: Visualized paranasal sinuses and mastoid air cells are clear. Prior bilateral cataract extraction. Other: None. CT CERVICAL SPINE FINDINGS Alignment: Minimal anterolisthesis of C3 on C4 and C4 on C5, likely degenerative and facet mediated. Skull base and vertebrae: No acute fracture or destructive osseous process identified. Soft tissues and spinal canal: No prevertebral fluid or swelling. No visible canal hematoma. Disc levels: Advanced disc degeneration at C6-7 with severe disc space narrowing. Bilateral posterior element ankylosis as well as partial interbody ankylosis from C4-C6. Advanced facet arthrosis bilaterally at C2-3 and C3-4 and on the left at C6-7. No high-grade spinal stenosis or high-grade osseous neural foraminal stenosis. Upper chest: Unremarkable. Other: None. IMPRESSION: 1. No evidence of acute intracranial abnormality. 2. Mild chronic small vessel ischemic disease. 3. No evidence of acute cervical spine fracture or traumatic subluxation. Electronically Signed    By: Logan Bores M.D.   On: 06/15/2016 12:15   Dg Hand Complete Left  Result Date: 06/15/2016 CLINICAL DATA:  Status post fall 3 days ago. Persistent left hand pain centered over the fifth metacarpal region. EXAM: LEFT HAND - COMPLETE 3+ VIEW COMPARISON:  None in PACs FINDINGS: The patient has sustained an acute mildly displaced fracture of the distal third of the shaft of the fifth metacarpal. The first through fourth metacarpals are intact. The phalanges are intact. There is mild degenerative disc space narrowing of the interphalangeal joints. The MCP joints are reasonably well-maintained. The intercarpal and carpometacarpal joints exhibit no acute abnormalities. IMPRESSION: The patient has sustained an acute mildly displaced and angulated fracture of the distal aspect of the shaft of the left fifth metacarpal. There mild osteoarthritic changes of the interphalangeal joints appropriate for age. Electronically Signed   By: David  Martinique M.D.   On: 06/15/2016 12:20   Dg Hip Unilat With Pelvis 2-3 Views Right  Result Date: 06/15/2016 CLINICAL DATA:  Status post fall 3 days ago. Persistent right hip and groin pain. EXAM: DG HIP (WITH OR WITHOUT PELVIS) 2-3V RIGHT COMPARISON:  None in PACs FINDINGS: The bones are subjectively osteopenic. There is no acute iliac bone fracture. There is an 8 comminuted nondisplaced fracture through the midbody of the inferior pubic ramus. A fracture extends obliquely into the parasymphyseal region of the superior pubic ramus on the right. The left hemipelvis is unremarkable. AP and lateral views of the right hip reveal preservation of the joint space. The articular surfaces of the femoral head and acetabulum remain smoothly rounded. The femoral neck, intertrochanteric, and subtrochanteric regions are normal. IMPRESSION: Acute fractures of the right superior and inferior pubic rami. No acute fracture of the right hip. Electronically Signed   By: David  Martinique M.D.   On:  06/15/2016 12:19    Procedures Procedures (including critical care time)  Medications Ordered in ED Medications  Tdap (BOOSTRIX) injection 0.5 mL (0.5 mLs Intramuscular Refused 06/15/16 1344)  HYDROcodone-acetaminophen (NORCO/VICODIN) 5-325 MG per tablet 1 tablet (1 tablet Oral Given 06/15/16 1443)  potassium chloride SA (K-DUR,KLOR-CON) CR tablet 40 mEq (40 mEq Oral Given 06/15/16 1522)     Initial  Impression / Assessment and Plan / ED Course  I have reviewed the triage vital signs and the nursing notes.  Pertinent labs & imaging results that were available during my care of the patient were reviewed by me and considered in my medical decision making (see chart for details).    83 yo F with PMhx as above here with mild facial pain, hip pain, and hand pain s/p mechanical fall. Pt has h/o recurrent falls 2/2 orthostasis and reports that she stood up too fast and fell several days ago. At mental baseline since then but having pain with ambulation. Labs, imaging are as above. Pt has pelvic fx, non-displaced, and left fifth finger fx. I consulted Orthopedics who evaluated both injuries and feel pt can WBAT on pelvis, NWB to LUE and follow-up as outpt. Pain is improved in ED with oral pain control. Labs are o/w reassuring - mild hypokalemia which has been repleted, and mild, likely reactive leukocytosis.  I discussed labs, imaging with pt and daughter. She has h/o syncope/orthostasis which I suspect contributed to her fall, but we discussed risks/benefits and indications of admission for syncope eval. Pt declines admission at this time. She states she has a h/o same and does not feel further w/u needed. She already has a SNF bed available at her facility and I have confirmed this with the facility. She will be able to receive skilled PT eval there. Given o/w reassuring labs, good pain control, and desire for outpt SNF treatment, will d/c to SNF with PTAR. Daughter present and in agreement with plan for  discharge, does not feel pt needs admission which is reasonable.  Final Clinical Impressions(s) / ED Diagnoses   Final diagnoses:  Fall  Pelvic fracture ([place])  Displaced fracture of base of fifth metacarpal bone, left hand, initial encounter for closed fracture    New Prescriptions Discharge Medication List as of 06/15/2016  3:37 PM    START taking these medications   Details  HYDROcodone-acetaminophen (NORCO/VICODIN) 5-325 MG tablet Take 1 tablet by mouth every 4 (four) hours as needed for severe pain., Starting Mon 06/15/2016, Print         Jessica Bruce, MD 06/15/16 8638    Jessica Bruce, MD 06/15/16 [phone]

## 2016-06-15 NOTE — ED Triage Notes (Addendum)
Pt in from [facility] via [facility] after self-reported fall on 4/13. Pt states she had unwitnessed syncopal episode on Friday, laceration to R forehead and c/o R groin pain. Pt takes Coumadin. L BBB on EKG per EMS, CBG 130. VSS, alert, MAE's equally

## 2016-06-15 NOTE — ED Notes (Signed)
Pt brought to room for ER MD exam daughter at bedside

## 2016-06-15 NOTE — Consult Note (Signed)
Reason for Consult:Pelvic fxs and 5th [facility] fx Referring Physician: C Kyndel Garza is an 83 y.o. female.  HPI: [name] was at home in the independent part of [facility]. She was walking to another room and began to feel dizzy. She rested against a door frame and then the next thing she knew she was on the floor. She didn't have much pain initially but it's been getting steadily worse since the fall. This morning she was unable to get out of bed unassisted and was brought to the ED for evaluation. X-rays showed a left 5th MC fx and right sup/inf pubic rami fxs. She is LHD.  She Jessica/o slight soreness in the left hand and aching pain in the right side pubic area.  Pain in the hand is better with splinting and pain in the groin is better with rest.  Past Medical History:  Diagnosis Date  . A-fib ([place])   . Breast cancer ([place])    left   . Hypertension     Past Surgical History:  Procedure Laterality Date  . ABDOMINAL HYSTERECTOMY    . BREAST LUMPECTOMY      Family History  Problem Relation Age of Onset  . Heart disease Mother   . Ulcers Father   . Colon cancer Father   . Asthma Sister   . Asthma Son     Social History:  reports that she has quit smoking. Her smoking use included Cigarettes. She has a 1.00 pack-year smoking history. She has never used smokeless tobacco. She reports that she drinks about 1.2 oz of alcohol per week . She reports that she does not use drugs.  Allergies:  Allergies  Allergen Reactions  . Codeine Nausea Only    Nausea     Medications: I have reviewed the patient's current medications.  Results for orders placed or performed during the hospital encounter of 06/15/16 (from the past 48 hour(s))  CBC with Differential     Status: Abnormal   Collection Time: 06/15/16 12:37 PM  Result Value Ref Range   WBC 11.4 (H) 4.0 - 10.5 K/uL   RBC 3.68 (L) 3.87 - 5.11 MIL/uL   Hemoglobin 13.0 12.0 - 15.0 g/dL   HCT 36.9 36.0 - 46.0 %   MCV 100.3 (H)  78.0 - 100.0 fL   MCH 35.3 (H) 26.0 - 34.0 pg   MCHC 35.2 30.0 - 36.0 g/dL   RDW 13.4 11.5 - 15.5 %   Platelets 207 150 - 400 K/uL   Neutrophils Relative % 72 %   Neutro Abs 8.2 (H) 1.7 - 7.7 K/uL   Lymphocytes Relative 19 %   Lymphs Abs 2.1 0.7 - 4.0 K/uL   Monocytes Relative 9 %   Monocytes Absolute 1.0 0.1 - 1.0 K/uL   Eosinophils Relative 0 %   Eosinophils Absolute 0.0 0.0 - 0.7 K/uL   Basophils Relative 0 %   Basophils Absolute 0.0 0.0 - 0.1 K/uL  Comprehensive metabolic panel     Status: Abnormal   Collection Time: 06/15/16 12:37 PM  Result Value Ref Range   Sodium 137 135 - 145 mmol/L   Potassium 3.2 (L) 3.5 - 5.1 mmol/L   Chloride 95 (L) 101 - 111 mmol/L   CO2 29 22 - 32 mmol/L   Glucose, Bld 116 (H) 65 - 99 mg/dL   BUN 18 6 - 20 mg/dL   Creatinine, Ser 0.98 0.44 - 1.00 mg/dL   Calcium 9.0 8.9 - 10.3 mg/dL   Total  Protein 6.2 (L) 6.5 - 8.1 g/dL   Albumin 3.4 (L) 3.5 - 5.0 g/dL   AST 20 15 - 41 U/L   ALT 12 (L) 14 - 54 U/L   Alkaline Phosphatase 84 38 - 126 U/L   Total Bilirubin 1.6 (H) 0.3 - 1.2 mg/dL   GFR calc non Af Amer 49 (L) >60 mL/min   GFR calc Af Amer 57 (L) >60 mL/min    Comment: (NOTE) The eGFR has been calculated using the CKD EPI equation. This calculation has not been validated in all clinical situations. eGFR's persistently <60 mL/min signify possible Chronic Kidney Disease.    Anion gap 13 5 - 15  Protime-INR     Status: Abnormal   Collection Time: 06/15/16 12:38 PM  Result Value Ref Range   Prothrombin Time 25.5 (H) 11.4 - 15.2 seconds   INR 2.27   I-Stat Troponin, ED (not at [facility])     Status: None   Collection Time: 06/15/16 12:39 PM  Result Value Ref Range   Troponin i, poc 0.03 0.00 - 0.08 ng/mL   Comment 3            Comment: Due to the release kinetics of cTnI, a negative result within the first hours of the onset of symptoms does not rule out myocardial infarction with certainty. If myocardial infarction is still suspected, repeat  the test at appropriate intervals.   Urinalysis, Routine w reflex microscopic     Status: Abnormal   Collection Time: 06/15/16  1:10 PM  Result Value Ref Range   Color, Urine YELLOW YELLOW   APPearance HAZY (A) CLEAR   Specific Gravity, Urine 1.019 1.005 - 1.030   pH 5.0 5.0 - 8.0   Glucose, UA NEGATIVE NEGATIVE mg/dL   Hgb urine dipstick MODERATE (A) NEGATIVE   Bilirubin Urine NEGATIVE NEGATIVE   Ketones, ur NEGATIVE NEGATIVE mg/dL   Protein, ur NEGATIVE NEGATIVE mg/dL   Nitrite NEGATIVE NEGATIVE   Leukocytes, UA NEGATIVE NEGATIVE   RBC / HPF 6-30 0 - 5 RBC/hpf   WBC, UA 0-5 0 - 5 WBC/hpf   Bacteria, UA RARE (A) NONE SEEN   Squamous Epithelial / LPF 6-30 (A) NONE SEEN   Mucous PRESENT    Hyaline Casts, UA PRESENT    Non Squamous Epithelial 0-5 (A) NONE SEEN    Dg Chest 2 View  Result Date: 06/15/2016 CLINICAL DATA:  Fall 3 days ago with right-sided chest pain, initial encounter EXAM: CHEST  2 VIEW COMPARISON:  07/27/2014 FINDINGS: Cardiac shadow is mildly enlarged. Aortic calcifications are noted. The lungs are well aerated bilaterally. No acute bony abnormality is seen. No focal infiltrates are noted. Degenerative changes of the acromioclavicular joints are seen bilaterally. IMPRESSION: No acute abnormality noted. Electronically Signed   By: Inez Catalina M.D.   On: 06/15/2016 12:17   Ct Head Wo Contrast  Result Date: 06/15/2016 CLINICAL DATA:  Fall 3 days ago.  Headache.  Initial encounter. EXAM: CT HEAD WITHOUT CONTRAST CT CERVICAL SPINE WITHOUT CONTRAST TECHNIQUE: Multidetector CT imaging of the head and cervical spine was performed following the standard protocol without intravenous contrast. Multiplanar CT image reconstructions of the cervical spine were also generated. COMPARISON:  None. FINDINGS: CT HEAD FINDINGS Brain: There is no evidence of acute cortical infarct, intracranial hemorrhage, mass, midline shift, or extra-axial fluid collection. Mild cerebral atrophy is within  normal limits for age. Patchy bilateral cerebral white matter hypodensities are nonspecific but compatible with chronic small vessel ischemic disease,  mild for age. Vascular: Calcified atherosclerosis at the skullbase. No hyperdense vessel. Skull: No fracture or focal osseous lesion. Sinuses/Orbits: Visualized paranasal sinuses and mastoid air cells are clear. Prior bilateral cataract extraction. Other: None. CT CERVICAL SPINE FINDINGS Alignment: Minimal anterolisthesis of C3 on C4 and C4 on C5, likely degenerative and facet mediated. Skull base and vertebrae: No acute fracture or destructive osseous process identified. Soft tissues and spinal canal: No prevertebral fluid or swelling. No visible canal hematoma. Disc levels: Advanced disc degeneration at C6-7 with severe disc space narrowing. Bilateral posterior element ankylosis as well as partial interbody ankylosis from C4-C6. Advanced facet arthrosis bilaterally at C2-3 and C3-4 and on the left at C6-7. No high-grade spinal stenosis or high-grade osseous neural foraminal stenosis. Upper chest: Unremarkable. Other: None. IMPRESSION: 1. No evidence of acute intracranial abnormality. 2. Mild chronic small vessel ischemic disease. 3. No evidence of acute cervical spine fracture or traumatic subluxation. Electronically Signed   By: Logan Bores M.D.   On: 06/15/2016 12:15   Ct Cervical Spine Wo Contrast  Result Date: 06/15/2016 CLINICAL DATA:  Fall 3 days ago.  Headache.  Initial encounter. EXAM: CT HEAD WITHOUT CONTRAST CT CERVICAL SPINE WITHOUT CONTRAST TECHNIQUE: Multidetector CT imaging of the head and cervical spine was performed following the standard protocol without intravenous contrast. Multiplanar CT image reconstructions of the cervical spine were also generated. COMPARISON:  None. FINDINGS: CT HEAD FINDINGS Brain: There is no evidence of acute cortical infarct, intracranial hemorrhage, mass, midline shift, or extra-axial fluid collection. Mild cerebral  atrophy is within normal limits for age. Patchy bilateral cerebral white matter hypodensities are nonspecific but compatible with chronic small vessel ischemic disease, mild for age. Vascular: Calcified atherosclerosis at the skullbase. No hyperdense vessel. Skull: No fracture or focal osseous lesion. Sinuses/Orbits: Visualized paranasal sinuses and mastoid air cells are clear. Prior bilateral cataract extraction. Other: None. CT CERVICAL SPINE FINDINGS Alignment: Minimal anterolisthesis of C3 on C4 and C4 on C5, likely degenerative and facet mediated. Skull base and vertebrae: No acute fracture or destructive osseous process identified. Soft tissues and spinal canal: No prevertebral fluid or swelling. No visible canal hematoma. Disc levels: Advanced disc degeneration at C6-7 with severe disc space narrowing. Bilateral posterior element ankylosis as well as partial interbody ankylosis from C4-C6. Advanced facet arthrosis bilaterally at C2-3 and C3-4 and on the left at C6-7. No high-grade spinal stenosis or high-grade osseous neural foraminal stenosis. Upper chest: Unremarkable. Other: None. IMPRESSION: 1. No evidence of acute intracranial abnormality. 2. Mild chronic small vessel ischemic disease. 3. No evidence of acute cervical spine fracture or traumatic subluxation. Electronically Signed   By: Logan Bores M.D.   On: 06/15/2016 12:15   Dg Hand Complete Left  Result Date: 06/15/2016 CLINICAL DATA:  Status post fall 3 days ago. Persistent left hand pain centered over the fifth metacarpal region. EXAM: LEFT HAND - COMPLETE 3+ VIEW COMPARISON:  None in PACs FINDINGS: The patient has sustained an acute mildly displaced fracture of the distal third of the shaft of the fifth metacarpal. The first through fourth metacarpals are intact. The phalanges are intact. There is mild degenerative disc space narrowing of the interphalangeal joints. The MCP joints are reasonably well-maintained. The intercarpal and  carpometacarpal joints exhibit no acute abnormalities. IMPRESSION: The patient has sustained an acute mildly displaced and angulated fracture of the distal aspect of the shaft of the left fifth metacarpal. There mild osteoarthritic changes of the interphalangeal joints appropriate for age. Electronically Signed  By: David  Martinique M.D.   On: 06/15/2016 12:20   Dg Hip Unilat With Pelvis 2-3 Views Right  Result Date: 06/15/2016 CLINICAL DATA:  Status post fall 3 days ago. Persistent right hip and groin pain. EXAM: DG HIP (WITH OR WITHOUT PELVIS) 2-3V RIGHT COMPARISON:  None in PACs FINDINGS: The bones are subjectively osteopenic. There is no acute iliac bone fracture. There is an 8 comminuted nondisplaced fracture through the midbody of the inferior pubic ramus. A fracture extends obliquely into the parasymphyseal region of the superior pubic ramus on the right. The left hemipelvis is unremarkable. AP and lateral views of the right hip reveal preservation of the joint space. The articular surfaces of the femoral head and acetabulum remain smoothly rounded. The femoral neck, intertrochanteric, and subtrochanteric regions are normal. IMPRESSION: Acute fractures of the right superior and inferior pubic rami. No acute fracture of the right hip. Electronically Signed   By: David  Martinique M.D.   On: 06/15/2016 12:19    Review of Systems  Constitutional: Negative for weight loss.  HENT: Negative for ear discharge, ear pain, hearing loss and tinnitus.   Eyes: Negative for blurred vision, double vision, photophobia and pain.  Respiratory: Negative for cough, sputum production and shortness of breath.   Cardiovascular: Negative for chest pain.  Gastrointestinal: Negative for abdominal pain, nausea and vomiting.  Genitourinary: Negative for dysuria, flank pain, frequency and urgency.  Musculoskeletal: Positive for joint pain. Negative for back pain, falls, myalgias and neck pain.  Neurological: Negative for  dizziness, tingling, sensory change, focal weakness, loss of consciousness and headaches.  Endo/Heme/Allergies: Does not bruise/bleed easily.  Psychiatric/Behavioral: Negative for depression, memory loss and substance abuse. The patient is not nervous/anxious.    Blood pressure 119/70, pulse 68, temperature 98.1 F (36.7 Jessica), temperature source Oral, resp. rate 15, weight 64.9 kg (143 lb), SpO2 97 %. Physical Exam  Constitutional: She appears well-developed and well-nourished. No distress.  HENT:  Head: Normocephalic.  Eyes: Conjunctivae are normal. Right eye exhibits no discharge. Left eye exhibits no discharge. No scleral icterus.  Cardiovascular: Normal rate.   Respiratory: Effort normal. No respiratory distress.  Musculoskeletal:  Right shoulder, elbow, wrist, digits- no skin wounds, nontender, no instability, no blocks to motion  Sens  Ax/R/M/U intact  Mot   Ax/ R/ PIN/ M/ AIN/ U intact  Rad 2+  Left shoulder, elbow, wrist, digits- no skin wounds, TTP over distal 5th MC, no instability, no blocks to motion  Sens  Ax/R/M/U intact  Mot   Ax/ R/ PIN/ M/ AIN/ U intact  Rad 2+  Pelvis--no traumatic wounds or rash, no ecchymosis, stable to manual stress, nontender  LLE No traumatic wounds, ecchymosis, or rash  Nontender  No effusions  Knee stable to varus/ valgus and anterior/posterior stress  Sens DPN, SPN, TN intact  Motor EHL, ext, flex, evers 5/5  DP 2+, PT 2+, No significant edema   LLE No traumatic wounds, ecchymosis, or rash  TTP right hip  No effusions  Knee stable to varus/ valgus and anterior/posterior stress  Sens DPN, SPN, TN intact  Motor EHL, ext, flex, evers 5/5  DP 2+, PT 2+, No significant edema  Neurological: She is alert.  Skin: Skin is warm and dry. She is not diaphoretic.  Psychiatric: She has a normal mood and affect. Her behavior is normal.    Assessment/Plan: Fall Left 5th MC fx -- Ulnar gutter splint, NWB Right sup/inf pubic rami fxs --  WBAT  Ok to dc  to SNF at [facility] which is the patient's wish. F/u with Dr. Caralyn Guile for her hand and Dr. Doran Durand for her pelvis.    Lisette Abu, PA-[facility] [phone] 06/15/2016, 2:28 PM   Pt seen and examined.  Agree with findings documented in note above.  A:  R sup and inf pubic ramus fractures - pain control.  WBAT on R LE with walker.  F/u with me in the office in two weeks.  L 5th MT fracture - splint.  f/u with Dr. Caralyn Guile in the office as directed.  The patient and her daughter understand the plan and agree.

## 2016-06-15 NOTE — Progress Notes (Signed)
Orthopedic Tech Progress Note Patient Details:  [name] [birth year] [id number]  Ortho Devices Type of Ortho Device: Ulna gutter splint, Arm sling Ortho Device/Splint Location: Applied Ulna gutter splint to pt left hand/wrist.  Pt tolerated well.  Provided Arm Sling for support left at bedside.  pt is scheduled for transport to rehab today and Arm sling will be helpful. Left Hand Arm. Ortho Device/Splint Interventions: Application   Kristopher Oppenheim 06/15/2016, 3:37 PM

## 2016-06-16 ENCOUNTER — Non-Acute Institutional Stay (SKILLED_NURSING_FACILITY): Payer: Medicare Other | Admitting: Internal Medicine

## 2016-06-16 ENCOUNTER — Encounter: Payer: Self-pay | Admitting: Internal Medicine

## 2016-06-16 DIAGNOSIS — I482 Chronic atrial fibrillation, unspecified: Secondary | ICD-10-CM

## 2016-06-16 DIAGNOSIS — I10 Essential (primary) hypertension: Secondary | ICD-10-CM

## 2016-06-16 DIAGNOSIS — I5032 Chronic diastolic (congestive) heart failure: Secondary | ICD-10-CM | POA: Diagnosis not present

## 2016-06-16 DIAGNOSIS — C44622 Squamous cell carcinoma of skin of right upper limb, including shoulder: Secondary | ICD-10-CM | POA: Diagnosis not present

## 2016-06-16 DIAGNOSIS — C44729 Squamous cell carcinoma of skin of left lower limb, including hip: Secondary | ICD-10-CM | POA: Diagnosis not present

## 2016-06-16 DIAGNOSIS — Z7901 Long term (current) use of anticoagulants: Secondary | ICD-10-CM | POA: Diagnosis not present

## 2016-06-16 DIAGNOSIS — S32810A Multiple fractures of pelvis with stable disruption of pelvic ring, initial encounter for closed fracture: Secondary | ICD-10-CM

## 2016-06-16 DIAGNOSIS — R55 Syncope and collapse: Secondary | ICD-10-CM | POA: Diagnosis not present

## 2016-06-16 DIAGNOSIS — L0889 Other specified local infections of the skin and subcutaneous tissue: Secondary | ICD-10-CM | POA: Diagnosis not present

## 2016-06-16 DIAGNOSIS — I4891 Unspecified atrial fibrillation: Secondary | ICD-10-CM | POA: Diagnosis not present

## 2016-06-16 DIAGNOSIS — S62307A Unspecified fracture of fifth metacarpal bone, left hand, initial encounter for closed fracture: Secondary | ICD-10-CM

## 2016-06-16 DIAGNOSIS — Z8679 Personal history of other diseases of the circulatory system: Secondary | ICD-10-CM | POA: Diagnosis not present

## 2016-06-16 DIAGNOSIS — L570 Actinic keratosis: Secondary | ICD-10-CM | POA: Diagnosis not present

## 2016-06-16 DIAGNOSIS — D485 Neoplasm of uncertain behavior of skin: Secondary | ICD-10-CM | POA: Diagnosis not present

## 2016-06-16 NOTE — Progress Notes (Signed)
Provider:  Rexene Edison. Mariea Clonts, D.O., C.M.D. Location:  [place] Room Number: 144 rehab Place of Service:  SNF (31)  PCP: [name], MD Patient Care Team: Shon Baton, MD as PCP - General (Internal Medicine)  Extended Emergency Contact Information Primary Emergency Contact: [name] of [place] Phone: [phone] Work Phone: [phone] Mobile Phone: [phone] Relation: Daughter  Code Status: full code Goals of Care: Advanced Directive information Advanced Directives 06/15/2016  Does Patient Have a Medical Advance Directive? No   Chief Complaint  Patient presents with  . New Admit To SNF    Rehab admission    HPI: Patient is a 83 y.o. female with h/o chronic afib on coumadin, orthostatic hypotension, chronic diastolic chf followed by Dr. Tamala Julian, left breast cancer, and htn seen today for admission to rehab s/p syncope on 4/13 with laceration of her right forehead, pain in right groin, right ribs, headache .  EKG with LBBB, negative troponin.  CBG 130.  Had right superior and inferior pubic rami fxs (pt to f/u with Dr. Doran Durand) and left 5th metacarpal fx which was splinted and arm put in sling (pt to f/u with Dr. Caralyn Guile).  She was started on hydrocodone for pain.  She is WBAT with RLE with walker.  She arrived here on 4/16 to [facility] rehab.    Of note, labs during ED visit were significant for mild leukocytosis of 11.4, K 3.2, INR 2.27, negative UA and negative troponin.  I reviewed phone notes from cardiology that indicated that her spironolactone had been stopped several months ago due to worsening "dizziness".    When seen, she had just gotten back from a dermatology appt in [facility] where she had an area on her right upper arm and some other areas biopsied for skin cancer.  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  Past Medical History:  Diagnosis Date  . A-fib  ([place])   . Breast cancer ([place])    left   . Hypertension    Past Surgical History:  Procedure Laterality Date  . ABDOMINAL HYSTERECTOMY    . BREAST LUMPECTOMY      Social History   Social History  . Marital status: Married    Spouse name: N/A  . Number of children: N/A  . Years of education: N/A   Social History Main Topics  . Smoking status: Former Smoker    Packs/day: 0.20    Years: 5.00    Types: Cigarettes  . Smokeless tobacco: Never Used     Comment: pt has not smoked in 50 years  . Alcohol use 1.2 oz/week    2 Glasses of wine per week  . Drug use: No  . Sexual activity: Not Asked   Other Topics Concern  . None   Social History Narrative  . None    reports that she has quit smoking. Her smoking use included Cigarettes. She has a 1.00 pack-year smoking history. She has never used smokeless tobacco. She reports that she drinks about 1.2 oz of alcohol per week . She reports that she does not use drugs.  Functional Status Survey:    Family History  Problem Relation Age of Onset  . Heart disease Mother   . Ulcers Father   . Colon cancer Father   . Asthma Sister   . Asthma Son     Health Maintenance  Topic Date Due  . Samul Dada  05/28/1943  . PNA vac Low Risk Adult (1 of 2 - PCV13) 05/29/1991  . INFLUENZA VACCINE  09/30/2016  . DEXA SCAN  Completed    Allergies  Allergen Reactions  . Codeine Nausea Only    Nausea     Allergies as of 06/16/2016      Reactions   Codeine Nausea Only   Nausea      Medication List       Accurate as of 06/16/16 10:32 AM. Always use your most recent med list.          albuterol 108 (90 Base) MCG/ACT inhaler Commonly known as:  PROVENTIL HFA;VENTOLIN HFA Inhale 2 puffs into the lungs every 4 (four) hours as needed for wheezing or shortness of breath.   ALPRAZolam 0.5 MG tablet Commonly known as:  XANAX Take 0.5 mg by mouth at bedtime.   amLODipine 2.5 MG tablet Commonly known as:  NORVASC Take 2.5 mg by  mouth daily.   aspirin 81 MG tablet Take 81 mg by mouth daily.   CALCIUM 1200 PO Take 1 tablet by mouth daily.   furosemide 40 MG tablet Commonly known as:  LASIX Take 2 tablets (80 mg total) by mouth daily.   HYDROcodone-acetaminophen 5-325 MG tablet Commonly known as:  NORCO/VICODIN Take 1 tablet by mouth every 4 (four) hours as needed for severe pain.   metoprolol succinate 100 MG 24 hr tablet Commonly known as:  TOPROL-XL Take 100 mg by mouth daily.   potassium chloride 10 MEQ tablet Commonly known as:  K-DUR Take 1 tablet (10 mEq total) by mouth daily.   spironolactone 25 MG tablet Commonly known as:  ALDACTONE Take 1 tablet (25 mg total) by mouth 2 (two) times daily.   warfarin 2 MG tablet Commonly known as:  COUMADIN Take 2 mg by mouth daily.       Review of Systems  Constitutional: Positive for malaise/fatigue. Negative for chills and fever.  HENT: Positive for hearing loss.   Eyes: Negative for blurred vision.  Respiratory: Negative for cough and shortness of breath.   Cardiovascular: Negative for chest pain, palpitations and leg swelling.  Gastrointestinal: Negative for abdominal pain, blood in stool, constipation and melena.  Genitourinary: Negative for dysuria.  Musculoskeletal: Positive for falls.       Pain in right ribs and groin  Skin:       Skin cancer right upper arm; laceration right forehead  Neurological: Positive for dizziness, loss of consciousness and weakness.  Endo/Heme/Allergies: Bruises/bleeds easily.  Psychiatric/Behavioral: Negative for depression and memory loss.    Vitals:   06/16/16 0947  BP: 128/78  Pulse: 75  Resp: 16  Temp: 97.5 F (36.4 C)  TempSrc: Oral  SpO2: 94%  Weight: 134 lb (60.8 kg)   Body mass index is 23 kg/m. Physical Exam  Constitutional: She is oriented to person, place, and time. She appears well-developed and well-nourished. No distress.  Seated in wheelchair  HENT:  Right Ear: External ear  normal.  Left Ear: External ear normal.  Nose: Nose normal.  Mouth/Throat: Oropharynx is clear and moist. No oropharyngeal exudate.  Eyes: Conjunctivae and EOM are normal. Pupils are equal, round, and reactive to light.  Neck: Neck supple. No JVD present. No thyromegaly present.  Cardiovascular: Intact distal pulses.   irreg irreg  Pulmonary/Chest: Effort normal and breath sounds normal. No respiratory distress.  Abdominal: Soft. Bowel sounds are normal. She exhibits no distension. There is no tenderness.  Musculoskeletal: She exhibits tenderness.  Tender in right groin and right lower ribs; splint and sling on left 5th mcp; WBAT with RLE with walker  Lymphadenopathy:    She has no cervical adenopathy.  Neurological: She is alert and oriented to person, place, and time. No cranial nerve deficit.  Skin: Skin is warm and dry. Capillary refill takes less than 2 seconds.  Dressing over skin cancer biopsy RU arm, laceration right forehead  Psychiatric: She has a normal mood and affect.    Labs reviewed: Basic Metabolic Panel:  Recent Labs  07/15/15 1611 08/06/15 1421 06/15/16 1237  NA 141 136 137  K 3.6 4.0 3.2*  CL 98 94* 95*  CO2 _0 GLUCOSE 89 153* 116*  BUN _1 CREATININE 0.89* 0.88 0.98  CALCIUM 9.0 9.5 9.0   Liver Function Tests:  Recent Labs  07/15/15 1611 06/15/16 1237  AST 18 20  ALT 11 12*  ALKPHOS 73 84  BILITOT 1.0 1.6*  PROT 6.4 6.2*  ALBUMIN 3.7 3.4*   No results for input(s): LIPASE, AMYLASE in the last 8760 hours. No results for input(s): AMMONIA in the last 8760 hours. CBC:  Recent Labs  07/15/15 1611 06/15/16 1237  WBC 8.9 11.4*  NEUTROABS 5,251 8.2*  HGB 14.0 13.0  HCT 40.2 36.9  MCV 98.5 100.3*  PLT 237 207   Cardiac Enzymes: No results for input(s): CKTOTAL, CKMB, CKMBINDEX, TROPONINI in the last 8760 hours. BNP: Invalid input(s): POCBNP No results found for: HGBA1C No results found for: TSH No results found for:  VITAMINB12 No results found for: FOLATE No results found for: IRON, TIBC, FERRITIN  Imaging and Procedures obtained prior to SNF admission: Dg Chest 2 View  Result Date: 06/15/2016 CLINICAL DATA:  Fall 3 days ago with right-sided chest pain, initial encounter EXAM: CHEST  2 VIEW COMPARISON:  07/27/2014 FINDINGS: Cardiac shadow is mildly enlarged. Aortic calcifications are noted. The lungs are well aerated bilaterally. No acute bony abnormality is seen. No focal infiltrates are noted. Degenerative changes of the acromioclavicular joints are seen bilaterally. IMPRESSION: No acute abnormality noted. Electronically Signed   By: Inez Catalina M.D.   On: 06/15/2016 12:17   Ct Head Wo Contrast  Result Date: 06/15/2016 CLINICAL DATA:  Fall 3 days ago.  Headache.  Initial encounter. EXAM: CT HEAD WITHOUT CONTRAST CT CERVICAL SPINE WITHOUT CONTRAST TECHNIQUE: Multidetector CT imaging of the head and cervical spine was performed following the standard protocol without intravenous contrast. Multiplanar CT image reconstructions of the cervical spine were also generated. COMPARISON:  None. FINDINGS: CT HEAD FINDINGS Brain: There is no evidence of acute cortical infarct, intracranial hemorrhage, mass, midline shift, or extra-axial fluid collection. Mild cerebral atrophy is within normal limits for age. Patchy bilateral cerebral white matter hypodensities are nonspecific but compatible with chronic small vessel ischemic disease, mild for age. Vascular: Calcified atherosclerosis at the skullbase. No hyperdense vessel. Skull: No fracture or focal osseous lesion. Sinuses/Orbits: Visualized paranasal sinuses and mastoid air cells are clear. Prior bilateral cataract extraction. Other: None. CT CERVICAL SPINE FINDINGS Alignment: Minimal anterolisthesis of C3 on C4 and C4 on C5, likely degenerative and facet mediated. Skull base and vertebrae: No acute fracture or destructive osseous process identified. Soft tissues and spinal  canal: No prevertebral fluid or swelling. No visible canal hematoma. Disc levels: Advanced disc degeneration at C6-7 with severe disc space narrowing. Bilateral posterior element ankylosis as well as partial interbody ankylosis from C4-C6. Advanced facet arthrosis bilaterally at C2-3 and C3-4 and  on the left at C6-7. No high-grade spinal stenosis or high-grade osseous neural foraminal stenosis. Upper chest: Unremarkable. Other: None. IMPRESSION: 1. No evidence of acute intracranial abnormality. 2. Mild chronic small vessel ischemic disease. 3. No evidence of acute cervical spine fracture or traumatic subluxation. Electronically Signed   By: Logan Bores M.D.   On: 06/15/2016 12:15   Ct Cervical Spine Wo Contrast  Result Date: 06/15/2016 CLINICAL DATA:  Fall 3 days ago.  Headache.  Initial encounter. EXAM: CT HEAD WITHOUT CONTRAST CT CERVICAL SPINE WITHOUT CONTRAST TECHNIQUE: Multidetector CT imaging of the head and cervical spine was performed following the standard protocol without intravenous contrast. Multiplanar CT image reconstructions of the cervical spine were also generated. COMPARISON:  None. FINDINGS: CT HEAD FINDINGS Brain: There is no evidence of acute cortical infarct, intracranial hemorrhage, mass, midline shift, or extra-axial fluid collection. Mild cerebral atrophy is within normal limits for age. Patchy bilateral cerebral white matter hypodensities are nonspecific but compatible with chronic small vessel ischemic disease, mild for age. Vascular: Calcified atherosclerosis at the skullbase. No hyperdense vessel. Skull: No fracture or focal osseous lesion. Sinuses/Orbits: Visualized paranasal sinuses and mastoid air cells are clear. Prior bilateral cataract extraction. Other: None. CT CERVICAL SPINE FINDINGS Alignment: Minimal anterolisthesis of C3 on C4 and C4 on C5, likely degenerative and facet mediated. Skull base and vertebrae: No acute fracture or destructive osseous process identified. Soft  tissues and spinal canal: No prevertebral fluid or swelling. No visible canal hematoma. Disc levels: Advanced disc degeneration at C6-7 with severe disc space narrowing. Bilateral posterior element ankylosis as well as partial interbody ankylosis from C4-C6. Advanced facet arthrosis bilaterally at C2-3 and C3-4 and on the left at C6-7. No high-grade spinal stenosis or high-grade osseous neural foraminal stenosis. Upper chest: Unremarkable. Other: None. IMPRESSION: 1. No evidence of acute intracranial abnormality. 2. Mild chronic small vessel ischemic disease. 3. No evidence of acute cervical spine fracture or traumatic subluxation. Electronically Signed   By: Logan Bores M.D.   On: 06/15/2016 12:15   Dg Hand Complete Left  Result Date: 06/15/2016 CLINICAL DATA:  Status post fall 3 days ago. Persistent left hand pain centered over the fifth metacarpal region. EXAM: LEFT HAND - COMPLETE 3+ VIEW COMPARISON:  None in PACs FINDINGS: The patient has sustained an acute mildly displaced fracture of the distal third of the shaft of the fifth metacarpal. The first through fourth metacarpals are intact. The phalanges are intact. There is mild degenerative disc space narrowing of the interphalangeal joints. The MCP joints are reasonably well-maintained. The intercarpal and carpometacarpal joints exhibit no acute abnormalities. IMPRESSION: The patient has sustained an acute mildly displaced and angulated fracture of the distal aspect of the shaft of the left fifth metacarpal. There mild osteoarthritic changes of the interphalangeal joints appropriate for age. Electronically Signed   By: David  Martinique M.D.   On: 06/15/2016 12:20   Dg Hip Unilat With Pelvis 2-3 Views Right  Result Date: 06/15/2016 CLINICAL DATA:  Status post fall 3 days ago. Persistent right hip and groin pain. EXAM: DG HIP (WITH OR WITHOUT PELVIS) 2-3V RIGHT COMPARISON:  None in PACs FINDINGS: The bones are subjectively osteopenic. There is no acute  iliac bone fracture. There is an 8 comminuted nondisplaced fracture through the midbody of the inferior pubic ramus. A fracture extends obliquely into the parasymphyseal region of the superior pubic ramus on the right. The left hemipelvis is unremarkable. AP and lateral views of the right hip reveal preservation of  the joint space. The articular surfaces of the femoral head and acetabulum remain smoothly rounded. The femoral neck, intertrochanteric, and subtrochanteric regions are normal. IMPRESSION: Acute fractures of the right superior and inferior pubic rami. No acute fracture of the right hip. Electronically Signed   By: David  Martinique M.D.   On: 06/15/2016 12:19    Assessment/Plan 1. Syncope, unspecified syncope type -f/u bmp and orthostatics here -encourage hydration with water  2. History of orthostatic hypotension -check orthostatics -cont current bp regimen and monitor for lows and signs of intravascular depletion   3. Chronic atrial fibrillation (HCC) -stable, cont toprol xl for rate control, warfarin for anticoagulation   4. Chronic anticoagulation -cont on coumadin, last INR 2.27 on 52m po daily which was not changed at hospital, recheck in 4 wks unless something changes with abx or meds necessitating sooner recheck  5. Essential hypertension -bp at goal, but watch for orthostasis--if significant, may need to reduce meds -keep off aldactone due to prior dizziness with it  6. Chronic diastolic heart failure (HCC) -cont on lasix 871mdaily with kcl 104mdaily, monitor bmp, cont beta blocker, not on ace/arb  7. Multiple closed fractures of pelvis with stable disruption of pelvic ring, [place]; is WBAT on right leg (fxs right superior and inferior pelvic rami) -keep f/u with Dr. HewDoran Durand 2 wks -cont hydrocodone 1 tab po q 4 prn severe pain, might consider changing over to tylenol if she has pain but does not require opioid in the  future  8. Closed nondisplaced fracture of fifth metacarpal bone of left hand, unspecified portion of metacarpal, initial encounter -cont splint and sling and f/u with Dr. OrtCaralyn Guile planned  Family/ staff Communication: discussed with SNF rehab nurse  Labs/tests ordered:  f/u bmp due to hypokalemia and orthostatics  Mackenzy Grumbine L. Kloi Brodman, D.O. [facility] [address] [id number] Phone (Mon-Fri 8am-5pm):  [phone] Call:  [phone] prompts after 5pm & weekends Office Phone:  [phone] Fax:  [phone]

## 2016-06-17 ENCOUNTER — Telehealth: Payer: Self-pay | Admitting: *Deleted

## 2016-06-17 DIAGNOSIS — S62317S Displaced fracture of base of fifth metacarpal bone. left hand, sequela: Secondary | ICD-10-CM | POA: Diagnosis not present

## 2016-06-17 DIAGNOSIS — R071 Chest pain on breathing: Secondary | ICD-10-CM | POA: Diagnosis not present

## 2016-06-17 DIAGNOSIS — S32810A Multiple fractures of pelvis with stable disruption of pelvic ring, initial encounter for closed fracture: Secondary | ICD-10-CM | POA: Diagnosis not present

## 2016-06-17 DIAGNOSIS — Z9181 History of falling: Secondary | ICD-10-CM | POA: Diagnosis not present

## 2016-06-17 DIAGNOSIS — M25542 Pain in joints of left hand: Secondary | ICD-10-CM | POA: Diagnosis not present

## 2016-06-17 DIAGNOSIS — Z4789 Encounter for other orthopedic aftercare: Secondary | ICD-10-CM | POA: Diagnosis not present

## 2016-06-17 DIAGNOSIS — S62318S Displaced fracture of base of other metacarpal bone, sequela: Secondary | ICD-10-CM | POA: Diagnosis not present

## 2016-06-17 DIAGNOSIS — R278 Other lack of coordination: Secondary | ICD-10-CM | POA: Diagnosis not present

## 2016-06-17 DIAGNOSIS — S32810S Multiple fractures of pelvis with stable disruption of pelvic ring, sequela: Secondary | ICD-10-CM | POA: Diagnosis not present

## 2016-06-17 DIAGNOSIS — R2689 Other abnormalities of gait and mobility: Secondary | ICD-10-CM | POA: Diagnosis not present

## 2016-06-17 NOTE — Telephone Encounter (Signed)
[facility] RN called to confirm pt follow-up appointment was correct.  Pt and daughter states they were told to follow up in 2 weeks but was given a 1 week appointment.  EDCM reviewed chart to find that Hilbert Odor, PA suggested 2 week follow up.  [facility] RN will call office back with this information and request 2 week appointment. No further CM needs identified at this time.

## 2016-06-18 DIAGNOSIS — E876 Hypokalemia: Secondary | ICD-10-CM | POA: Diagnosis not present

## 2016-06-18 DIAGNOSIS — W010XXA Fall on same level from slipping, tripping and stumbling without subsequent striking against object, initial encounter: Secondary | ICD-10-CM | POA: Diagnosis not present

## 2016-06-18 DIAGNOSIS — R278 Other lack of coordination: Secondary | ICD-10-CM | POA: Diagnosis not present

## 2016-06-18 DIAGNOSIS — R2689 Other abnormalities of gait and mobility: Secondary | ICD-10-CM | POA: Diagnosis not present

## 2016-06-18 DIAGNOSIS — D649 Anemia, unspecified: Secondary | ICD-10-CM | POA: Diagnosis not present

## 2016-06-18 DIAGNOSIS — Z7901 Long term (current) use of anticoagulants: Secondary | ICD-10-CM | POA: Diagnosis not present

## 2016-06-18 DIAGNOSIS — R071 Chest pain on breathing: Secondary | ICD-10-CM | POA: Diagnosis not present

## 2016-06-18 DIAGNOSIS — M25542 Pain in joints of left hand: Secondary | ICD-10-CM | POA: Diagnosis not present

## 2016-06-18 DIAGNOSIS — S32511A Fracture of superior rim of right pubis, initial encounter for closed fracture: Secondary | ICD-10-CM | POA: Diagnosis not present

## 2016-06-18 DIAGNOSIS — S32810S Multiple fractures of pelvis with stable disruption of pelvic ring, sequela: Secondary | ICD-10-CM | POA: Diagnosis not present

## 2016-06-18 DIAGNOSIS — I4891 Unspecified atrial fibrillation: Secondary | ICD-10-CM | POA: Diagnosis not present

## 2016-06-18 DIAGNOSIS — Z4789 Encounter for other orthopedic aftercare: Secondary | ICD-10-CM | POA: Diagnosis not present

## 2016-06-18 LAB — BASIC METABOLIC PANEL
BUN: 18 mg/dL (ref 4–21)
Creatinine: 0.7 mg/dL (ref 0.5–1.1)
GLUCOSE: 90 mg/dL
Potassium: 3.9 mmol/L (ref 3.4–5.3)
Sodium: 137 mmol/L (ref 137–147)

## 2016-06-19 DIAGNOSIS — M25542 Pain in joints of left hand: Secondary | ICD-10-CM | POA: Diagnosis not present

## 2016-06-19 DIAGNOSIS — Z4789 Encounter for other orthopedic aftercare: Secondary | ICD-10-CM | POA: Diagnosis not present

## 2016-06-19 DIAGNOSIS — R278 Other lack of coordination: Secondary | ICD-10-CM | POA: Diagnosis not present

## 2016-06-19 DIAGNOSIS — S32810S Multiple fractures of pelvis with stable disruption of pelvic ring, sequela: Secondary | ICD-10-CM | POA: Diagnosis not present

## 2016-06-19 DIAGNOSIS — R071 Chest pain on breathing: Secondary | ICD-10-CM | POA: Diagnosis not present

## 2016-06-19 DIAGNOSIS — S62317A Displaced fracture of base of fifth metacarpal bone. left hand, initial encounter for closed fracture: Secondary | ICD-10-CM | POA: Diagnosis not present

## 2016-06-19 DIAGNOSIS — R2689 Other abnormalities of gait and mobility: Secondary | ICD-10-CM | POA: Diagnosis not present

## 2016-06-22 DIAGNOSIS — R278 Other lack of coordination: Secondary | ICD-10-CM | POA: Diagnosis not present

## 2016-06-22 DIAGNOSIS — M25542 Pain in joints of left hand: Secondary | ICD-10-CM | POA: Diagnosis not present

## 2016-06-22 DIAGNOSIS — S32810S Multiple fractures of pelvis with stable disruption of pelvic ring, sequela: Secondary | ICD-10-CM | POA: Diagnosis not present

## 2016-06-22 DIAGNOSIS — R071 Chest pain on breathing: Secondary | ICD-10-CM | POA: Diagnosis not present

## 2016-06-22 DIAGNOSIS — R2689 Other abnormalities of gait and mobility: Secondary | ICD-10-CM | POA: Diagnosis not present

## 2016-06-22 DIAGNOSIS — Z4789 Encounter for other orthopedic aftercare: Secondary | ICD-10-CM | POA: Diagnosis not present

## 2016-06-23 DIAGNOSIS — S32810S Multiple fractures of pelvis with stable disruption of pelvic ring, sequela: Secondary | ICD-10-CM | POA: Diagnosis not present

## 2016-06-23 DIAGNOSIS — M25542 Pain in joints of left hand: Secondary | ICD-10-CM | POA: Diagnosis not present

## 2016-06-23 DIAGNOSIS — R278 Other lack of coordination: Secondary | ICD-10-CM | POA: Diagnosis not present

## 2016-06-23 DIAGNOSIS — R071 Chest pain on breathing: Secondary | ICD-10-CM | POA: Diagnosis not present

## 2016-06-23 DIAGNOSIS — Z4789 Encounter for other orthopedic aftercare: Secondary | ICD-10-CM | POA: Diagnosis not present

## 2016-06-23 DIAGNOSIS — R2689 Other abnormalities of gait and mobility: Secondary | ICD-10-CM | POA: Diagnosis not present

## 2016-06-24 DIAGNOSIS — R2689 Other abnormalities of gait and mobility: Secondary | ICD-10-CM | POA: Diagnosis not present

## 2016-06-24 DIAGNOSIS — M25542 Pain in joints of left hand: Secondary | ICD-10-CM | POA: Diagnosis not present

## 2016-06-24 DIAGNOSIS — R278 Other lack of coordination: Secondary | ICD-10-CM | POA: Diagnosis not present

## 2016-06-24 DIAGNOSIS — Z4789 Encounter for other orthopedic aftercare: Secondary | ICD-10-CM | POA: Diagnosis not present

## 2016-06-24 DIAGNOSIS — R071 Chest pain on breathing: Secondary | ICD-10-CM | POA: Diagnosis not present

## 2016-06-24 DIAGNOSIS — S32810S Multiple fractures of pelvis with stable disruption of pelvic ring, sequela: Secondary | ICD-10-CM | POA: Diagnosis not present

## 2016-06-25 DIAGNOSIS — Z4789 Encounter for other orthopedic aftercare: Secondary | ICD-10-CM | POA: Diagnosis not present

## 2016-06-25 DIAGNOSIS — R278 Other lack of coordination: Secondary | ICD-10-CM | POA: Diagnosis not present

## 2016-06-25 DIAGNOSIS — R2689 Other abnormalities of gait and mobility: Secondary | ICD-10-CM | POA: Diagnosis not present

## 2016-06-25 DIAGNOSIS — M25542 Pain in joints of left hand: Secondary | ICD-10-CM | POA: Diagnosis not present

## 2016-06-25 DIAGNOSIS — R071 Chest pain on breathing: Secondary | ICD-10-CM | POA: Diagnosis not present

## 2016-06-25 DIAGNOSIS — I4891 Unspecified atrial fibrillation: Secondary | ICD-10-CM | POA: Diagnosis not present

## 2016-06-25 DIAGNOSIS — S32810S Multiple fractures of pelvis with stable disruption of pelvic ring, sequela: Secondary | ICD-10-CM | POA: Diagnosis not present

## 2016-06-25 DIAGNOSIS — Z7901 Long term (current) use of anticoagulants: Secondary | ICD-10-CM | POA: Diagnosis not present

## 2016-06-26 DIAGNOSIS — R278 Other lack of coordination: Secondary | ICD-10-CM | POA: Diagnosis not present

## 2016-06-26 DIAGNOSIS — M25542 Pain in joints of left hand: Secondary | ICD-10-CM | POA: Diagnosis not present

## 2016-06-26 DIAGNOSIS — Z4789 Encounter for other orthopedic aftercare: Secondary | ICD-10-CM | POA: Diagnosis not present

## 2016-06-26 DIAGNOSIS — R2689 Other abnormalities of gait and mobility: Secondary | ICD-10-CM | POA: Diagnosis not present

## 2016-06-26 DIAGNOSIS — S32810S Multiple fractures of pelvis with stable disruption of pelvic ring, sequela: Secondary | ICD-10-CM | POA: Diagnosis not present

## 2016-06-26 DIAGNOSIS — R071 Chest pain on breathing: Secondary | ICD-10-CM | POA: Diagnosis not present

## 2016-06-29 DIAGNOSIS — S32591A Other specified fracture of right pubis, initial encounter for closed fracture: Secondary | ICD-10-CM | POA: Diagnosis not present

## 2016-06-30 DIAGNOSIS — R278 Other lack of coordination: Secondary | ICD-10-CM | POA: Diagnosis not present

## 2016-06-30 DIAGNOSIS — S62317S Displaced fracture of base of fifth metacarpal bone. left hand, sequela: Secondary | ICD-10-CM | POA: Diagnosis not present

## 2016-06-30 DIAGNOSIS — S62318S Displaced fracture of base of other metacarpal bone, sequela: Secondary | ICD-10-CM | POA: Diagnosis not present

## 2016-06-30 DIAGNOSIS — S32810S Multiple fractures of pelvis with stable disruption of pelvic ring, sequela: Secondary | ICD-10-CM | POA: Diagnosis not present

## 2016-06-30 DIAGNOSIS — Z4789 Encounter for other orthopedic aftercare: Secondary | ICD-10-CM | POA: Diagnosis not present

## 2016-06-30 DIAGNOSIS — R071 Chest pain on breathing: Secondary | ICD-10-CM | POA: Diagnosis not present

## 2016-06-30 DIAGNOSIS — M25542 Pain in joints of left hand: Secondary | ICD-10-CM | POA: Diagnosis not present

## 2016-06-30 DIAGNOSIS — S32810A Multiple fractures of pelvis with stable disruption of pelvic ring, initial encounter for closed fracture: Secondary | ICD-10-CM | POA: Diagnosis not present

## 2016-06-30 DIAGNOSIS — R2689 Other abnormalities of gait and mobility: Secondary | ICD-10-CM | POA: Diagnosis not present

## 2016-06-30 DIAGNOSIS — Z9181 History of falling: Secondary | ICD-10-CM | POA: Diagnosis not present

## 2016-07-01 DIAGNOSIS — R071 Chest pain on breathing: Secondary | ICD-10-CM | POA: Diagnosis not present

## 2016-07-01 DIAGNOSIS — Z4789 Encounter for other orthopedic aftercare: Secondary | ICD-10-CM | POA: Diagnosis not present

## 2016-07-01 DIAGNOSIS — R2689 Other abnormalities of gait and mobility: Secondary | ICD-10-CM | POA: Diagnosis not present

## 2016-07-01 DIAGNOSIS — S32810S Multiple fractures of pelvis with stable disruption of pelvic ring, sequela: Secondary | ICD-10-CM | POA: Diagnosis not present

## 2016-07-01 DIAGNOSIS — R278 Other lack of coordination: Secondary | ICD-10-CM | POA: Diagnosis not present

## 2016-07-01 DIAGNOSIS — M25542 Pain in joints of left hand: Secondary | ICD-10-CM | POA: Diagnosis not present

## 2016-07-02 DIAGNOSIS — M25542 Pain in joints of left hand: Secondary | ICD-10-CM | POA: Diagnosis not present

## 2016-07-02 DIAGNOSIS — R278 Other lack of coordination: Secondary | ICD-10-CM | POA: Diagnosis not present

## 2016-07-02 DIAGNOSIS — S32810S Multiple fractures of pelvis with stable disruption of pelvic ring, sequela: Secondary | ICD-10-CM | POA: Diagnosis not present

## 2016-07-02 DIAGNOSIS — Z4789 Encounter for other orthopedic aftercare: Secondary | ICD-10-CM | POA: Diagnosis not present

## 2016-07-02 DIAGNOSIS — R071 Chest pain on breathing: Secondary | ICD-10-CM | POA: Diagnosis not present

## 2016-07-02 DIAGNOSIS — R2689 Other abnormalities of gait and mobility: Secondary | ICD-10-CM | POA: Diagnosis not present

## 2016-07-03 DIAGNOSIS — R2689 Other abnormalities of gait and mobility: Secondary | ICD-10-CM | POA: Diagnosis not present

## 2016-07-03 DIAGNOSIS — Z4789 Encounter for other orthopedic aftercare: Secondary | ICD-10-CM | POA: Diagnosis not present

## 2016-07-03 DIAGNOSIS — M25542 Pain in joints of left hand: Secondary | ICD-10-CM | POA: Diagnosis not present

## 2016-07-03 DIAGNOSIS — R071 Chest pain on breathing: Secondary | ICD-10-CM | POA: Diagnosis not present

## 2016-07-03 DIAGNOSIS — S32810S Multiple fractures of pelvis with stable disruption of pelvic ring, sequela: Secondary | ICD-10-CM | POA: Diagnosis not present

## 2016-07-03 DIAGNOSIS — R278 Other lack of coordination: Secondary | ICD-10-CM | POA: Diagnosis not present

## 2016-07-04 DIAGNOSIS — R071 Chest pain on breathing: Secondary | ICD-10-CM | POA: Diagnosis not present

## 2016-07-04 DIAGNOSIS — M25542 Pain in joints of left hand: Secondary | ICD-10-CM | POA: Diagnosis not present

## 2016-07-04 DIAGNOSIS — R2689 Other abnormalities of gait and mobility: Secondary | ICD-10-CM | POA: Diagnosis not present

## 2016-07-04 DIAGNOSIS — R278 Other lack of coordination: Secondary | ICD-10-CM | POA: Diagnosis not present

## 2016-07-04 DIAGNOSIS — Z4789 Encounter for other orthopedic aftercare: Secondary | ICD-10-CM | POA: Diagnosis not present

## 2016-07-04 DIAGNOSIS — S32810S Multiple fractures of pelvis with stable disruption of pelvic ring, sequela: Secondary | ICD-10-CM | POA: Diagnosis not present

## 2016-07-05 DIAGNOSIS — Z4789 Encounter for other orthopedic aftercare: Secondary | ICD-10-CM | POA: Diagnosis not present

## 2016-07-05 DIAGNOSIS — R278 Other lack of coordination: Secondary | ICD-10-CM | POA: Diagnosis not present

## 2016-07-05 DIAGNOSIS — M25542 Pain in joints of left hand: Secondary | ICD-10-CM | POA: Diagnosis not present

## 2016-07-05 DIAGNOSIS — R2689 Other abnormalities of gait and mobility: Secondary | ICD-10-CM | POA: Diagnosis not present

## 2016-07-05 DIAGNOSIS — R071 Chest pain on breathing: Secondary | ICD-10-CM | POA: Diagnosis not present

## 2016-07-05 DIAGNOSIS — S32810S Multiple fractures of pelvis with stable disruption of pelvic ring, sequela: Secondary | ICD-10-CM | POA: Diagnosis not present

## 2016-07-06 DIAGNOSIS — R071 Chest pain on breathing: Secondary | ICD-10-CM | POA: Diagnosis not present

## 2016-07-06 DIAGNOSIS — R2689 Other abnormalities of gait and mobility: Secondary | ICD-10-CM | POA: Diagnosis not present

## 2016-07-06 DIAGNOSIS — R278 Other lack of coordination: Secondary | ICD-10-CM | POA: Diagnosis not present

## 2016-07-06 DIAGNOSIS — Z4789 Encounter for other orthopedic aftercare: Secondary | ICD-10-CM | POA: Diagnosis not present

## 2016-07-06 DIAGNOSIS — S32810S Multiple fractures of pelvis with stable disruption of pelvic ring, sequela: Secondary | ICD-10-CM | POA: Diagnosis not present

## 2016-07-06 DIAGNOSIS — M25542 Pain in joints of left hand: Secondary | ICD-10-CM | POA: Diagnosis not present

## 2016-07-07 DIAGNOSIS — M25542 Pain in joints of left hand: Secondary | ICD-10-CM | POA: Diagnosis not present

## 2016-07-07 DIAGNOSIS — R278 Other lack of coordination: Secondary | ICD-10-CM | POA: Diagnosis not present

## 2016-07-07 DIAGNOSIS — R071 Chest pain on breathing: Secondary | ICD-10-CM | POA: Diagnosis not present

## 2016-07-07 DIAGNOSIS — Z4789 Encounter for other orthopedic aftercare: Secondary | ICD-10-CM | POA: Diagnosis not present

## 2016-07-07 DIAGNOSIS — R2689 Other abnormalities of gait and mobility: Secondary | ICD-10-CM | POA: Diagnosis not present

## 2016-07-07 DIAGNOSIS — S32810S Multiple fractures of pelvis with stable disruption of pelvic ring, sequela: Secondary | ICD-10-CM | POA: Diagnosis not present

## 2016-07-08 DIAGNOSIS — S32810S Multiple fractures of pelvis with stable disruption of pelvic ring, sequela: Secondary | ICD-10-CM | POA: Diagnosis not present

## 2016-07-08 DIAGNOSIS — M25542 Pain in joints of left hand: Secondary | ICD-10-CM | POA: Diagnosis not present

## 2016-07-08 DIAGNOSIS — R071 Chest pain on breathing: Secondary | ICD-10-CM | POA: Diagnosis not present

## 2016-07-08 DIAGNOSIS — R2689 Other abnormalities of gait and mobility: Secondary | ICD-10-CM | POA: Diagnosis not present

## 2016-07-08 DIAGNOSIS — Z4789 Encounter for other orthopedic aftercare: Secondary | ICD-10-CM | POA: Diagnosis not present

## 2016-07-08 DIAGNOSIS — R278 Other lack of coordination: Secondary | ICD-10-CM | POA: Diagnosis not present

## 2016-07-09 DIAGNOSIS — M25542 Pain in joints of left hand: Secondary | ICD-10-CM | POA: Diagnosis not present

## 2016-07-09 DIAGNOSIS — Z4789 Encounter for other orthopedic aftercare: Secondary | ICD-10-CM | POA: Diagnosis not present

## 2016-07-09 DIAGNOSIS — R2689 Other abnormalities of gait and mobility: Secondary | ICD-10-CM | POA: Diagnosis not present

## 2016-07-09 DIAGNOSIS — I4891 Unspecified atrial fibrillation: Secondary | ICD-10-CM | POA: Diagnosis not present

## 2016-07-09 DIAGNOSIS — R071 Chest pain on breathing: Secondary | ICD-10-CM | POA: Diagnosis not present

## 2016-07-09 DIAGNOSIS — R278 Other lack of coordination: Secondary | ICD-10-CM | POA: Diagnosis not present

## 2016-07-09 DIAGNOSIS — Z7901 Long term (current) use of anticoagulants: Secondary | ICD-10-CM | POA: Diagnosis not present

## 2016-07-09 DIAGNOSIS — S32810S Multiple fractures of pelvis with stable disruption of pelvic ring, sequela: Secondary | ICD-10-CM | POA: Diagnosis not present

## 2016-07-09 LAB — POCT INR: INR: 2.3 — AB (ref 0.9–1.1)

## 2016-07-10 ENCOUNTER — Non-Acute Institutional Stay (SKILLED_NURSING_FACILITY): Payer: Medicare Other | Admitting: Adult Health

## 2016-07-10 ENCOUNTER — Encounter: Payer: Self-pay | Admitting: Adult Health

## 2016-07-10 DIAGNOSIS — I872 Venous insufficiency (chronic) (peripheral): Secondary | ICD-10-CM | POA: Diagnosis not present

## 2016-07-10 DIAGNOSIS — S32810D Multiple fractures of pelvis with stable disruption of pelvic ring, subsequent encounter for fracture with routine healing: Secondary | ICD-10-CM

## 2016-07-10 DIAGNOSIS — Z7901 Long term (current) use of anticoagulants: Secondary | ICD-10-CM | POA: Diagnosis not present

## 2016-07-10 DIAGNOSIS — R0609 Other forms of dyspnea: Secondary | ICD-10-CM

## 2016-07-10 DIAGNOSIS — K5909 Other constipation: Secondary | ICD-10-CM | POA: Insufficient documentation

## 2016-07-10 DIAGNOSIS — S62317D Displaced fracture of base of fifth metacarpal bone. left hand, subsequent encounter for fracture with routine healing: Secondary | ICD-10-CM | POA: Diagnosis not present

## 2016-07-10 DIAGNOSIS — K5900 Constipation, unspecified: Secondary | ICD-10-CM | POA: Insufficient documentation

## 2016-07-10 DIAGNOSIS — I482 Chronic atrial fibrillation, unspecified: Secondary | ICD-10-CM

## 2016-07-10 DIAGNOSIS — S62307D Unspecified fracture of fifth metacarpal bone, left hand, subsequent encounter for fracture with routine healing: Secondary | ICD-10-CM | POA: Diagnosis not present

## 2016-07-10 DIAGNOSIS — I5032 Chronic diastolic (congestive) heart failure: Secondary | ICD-10-CM

## 2016-07-10 DIAGNOSIS — I10 Essential (primary) hypertension: Secondary | ICD-10-CM | POA: Diagnosis not present

## 2016-07-10 DIAGNOSIS — K5901 Slow transit constipation: Secondary | ICD-10-CM

## 2016-07-10 NOTE — Progress Notes (Signed)
Location:  Occupational psychologist of Service:  SNF (31) Provider:   Cindi Carbon, [place] [phone]   Shon Baton, MD  Patient Care Team: Shon Baton, MD as PCP - General (Internal Medicine)  Extended Emergency Contact Information Primary Emergency Contact: [name] of [place] Phone: [phone] Work Phone: [phone] Mobile Phone: [phone] Relation: Daughter  Code Status:  Full code Goals of care: Advanced Directive information Advanced Directives 07/10/2016  Does Patient Have a Medical Advance Directive? Yes  Type of Advance Directive Living will;Healthcare Power of [place] in Chart? Yes     Chief Complaint  Patient presents with  . Medical Management of Chronic Issues    HPI:  Pt is a 83 y.o. female seen today for medical management of chronic diseases.  She resides in skilled rehab due to a fall on 4/16 which was a possible syncopal event with subsequent Left 5th metacarpal fx and right superior and inferior pubic rami fxs.  The fall was felt to be due to orthostasis and standing up too fast. She is making progress with therapy but may need to be a couple more weeks depending on her progress.   F/U with Dr. Doran Durand due to superior and inferior pubic rami fx on 4/30 indicated she can WBAT and use a rolling walker, f/u 1 month  Saw Dr. Apolonio Schneiders dur to Left 5th MC fx on 4/20 which indicated she should see OT for a splint and f/u visit due today  Reports she has DOE which is at baseline and ProAir does not seem to help it. She says that the DOE is what slows her down, not the pain in her pelvis. She says the DOE is due to "afib". Sats are WNL without CP.She has fixed obstructive and restrictive lung disease and echo demonstrated diastolic dysfunction and secondary pulmonary htn. Her weight is stable at 142 lbs.   She uses prn norco and says this helps Takes miralax  daily and has regular BMs Has SCC to the Right arm and going to derm next week for treatment plan  Afib: rate controlled, INR 2.3 on coumadin  Stasis dermatitis: applies moisturizer and compression hose daily  Past Medical History:  Diagnosis Date  . A-fib ([place])   . Breast cancer ([place])    left   . Hypertension    Past Surgical History:  Procedure Laterality Date  . ABDOMINAL HYSTERECTOMY    . BREAST LUMPECTOMY      Allergies  Allergen Reactions  . Codeine Nausea Only    Nausea     Outpatient Encounter Prescriptions as of 07/10/2016  Medication Sig  . polyethylene glycol (MIRALAX / GLYCOLAX) packet Take 17 g by mouth daily as needed.  Marland Kitchen albuterol (PROVENTIL HFA;VENTOLIN HFA) 108 (90 BASE) MCG/ACT inhaler Inhale 2 puffs into the lungs every 4 (four) hours as needed for wheezing or shortness of breath.  . allopurinol (ZYLOPRIM) 100 MG tablet   . ALPRAZolam (XANAX) 0.5 MG tablet Take 0.5 mg by mouth at bedtime.   Marland Kitchen amLODipine (NORVASC) 2.5 MG tablet Take 2.5 mg by mouth daily.   Marland Kitchen aspirin 81 MG tablet Take 81 mg by mouth daily.  . furosemide (LASIX) 40 MG tablet Take 2 tablets (80 mg total) by mouth daily.  Marland Kitchen HYDROcodone-acetaminophen (NORCO/VICODIN) 5-325 MG tablet Take 1 tablet by mouth every 4 (four) hours as needed for severe pain.  . metoprolol succinate (TOPROL-XL) 100 MG  24 hr tablet Take 100 mg by mouth daily.   . potassium chloride (K-DUR) 10 MEQ tablet Take 1 tablet (10 mEq total) by mouth daily.  Marland Kitchen warfarin (COUMADIN) 2 MG tablet Take 2 mg by mouth daily.   No facility-administered encounter medications on file as of 07/10/2016.     Review of Systems  Constitutional: Positive for activity change. Negative for appetite change, chills, diaphoresis, fatigue, fever and unexpected weight change.  HENT: Negative for congestion.   Eyes: Negative for visual disturbance.  Respiratory: Positive for shortness of breath (on exertion, baseline). Negative for cough and  wheezing.   Cardiovascular: Positive for leg swelling. Negative for chest pain and palpitations.  Gastrointestinal: Negative for abdominal distention, abdominal pain, constipation and diarrhea.  Genitourinary: Negative for difficulty urinating and dysuria.  Musculoskeletal: Positive for arthralgias and gait problem. Negative for back pain, joint swelling and myalgias.  Neurological: Negative for dizziness, tremors, seizures, syncope, facial asymmetry, speech difficulty, weakness, light-headedness, numbness and headaches.  Psychiatric/Behavioral: Negative for agitation, behavioral problems and confusion.    Immunization History  Administered Date(s) Administered  . Influenza Split 12/07/2014  . Influenza-Unspecified 11/05/2013  . Pneumococcal Polysaccharide-23 03/02/2012  . Tdap 03/02/2012  . Zoster 03/03/2011   Pertinent  Health Maintenance Due  Topic Date Due  . PNA vac Low Risk Adult (2 of 2 - PCV13) 03/02/2013  . INFLUENZA VACCINE  09/30/2016  . DEXA SCAN  Completed   No flowsheet data found. Functional Status Survey:    Vitals:   07/10/16 1012  BP: 107/68  Pulse: (!) 56  Resp: 17  Temp: 97.1 F (36.2 C)  SpO2: 93%  Weight: 142 lb 9.6 oz (64.7 kg)   Body mass index is 24.48 kg/m.  Wt Readings from Last 3 Encounters:  07/10/16 142 lb 9.6 oz (64.7 kg)  06/16/16 134 lb (60.8 kg)  06/15/16 143 lb (64.9 kg)    Physical Exam  Constitutional: She is oriented to person, place, and time. No distress.  HENT:  Head: Normocephalic and atraumatic.  Neck: No JVD present.  Cardiovascular: Normal rate.   No murmur heard. Irregular, Trace edema to both ankles  Pulmonary/Chest: Effort normal and breath sounds normal. No respiratory distress. She has no wheezes.  Abdominal: Soft. Bowel sounds are normal.  Musculoskeletal: She exhibits no edema or tenderness.  Left hand with taped 4th and 5th metacarpals. No swelling, redness, bruising. +CMS   Neurological: She is alert and  oriented to person, place, and time.  Skin: Skin is warm and dry. Rash (erythema to BLE) noted. She is not diaphoretic.  Psychiatric: She has a normal mood and affect.  Nursing note and vitals reviewed.   Labs reviewed:  Recent Labs  07/15/15 1611 08/06/15 1421 06/15/16 1237 06/18/16  NA 141 136 137 137  K 3.6 4.0 3.2* 3.9  CL 98 94* 95*  --   CO2 _0 --   GLUCOSE 89 153* 116*  --   BUN _1 CREATININE 0.89* 0.88 0.98 0.7  CALCIUM 9.0 9.5 9.0  --     Recent Labs  07/15/15 1611 06/15/16 1237  AST 18 20  ALT 11 12*  ALKPHOS 73 84  BILITOT 1.0 1.6*  PROT 6.4 6.2*  ALBUMIN 3.7 3.4*    Recent Labs  07/15/15 1611 06/15/16 1237  WBC 8.9 11.4*  NEUTROABS 5,251 8.2*  HGB 14.0 13.0  HCT 40.2 36.9  MCV 98.5 100.3*  PLT 237 207   No results  found for: TSH No results found for: HGBA1C No results found for: CHOL, HDL, LDLCALC, LDLDIRECT, TRIG, CHOLHDL  Significant Diagnostic Results in last 30 days:  Dg Chest 2 View  Result Date: 06/15/2016 CLINICAL DATA:  Fall 3 days ago with right-sided chest pain, initial encounter EXAM: CHEST  2 VIEW COMPARISON:  07/27/2014 FINDINGS: Cardiac shadow is mildly enlarged. Aortic calcifications are noted. The lungs are well aerated bilaterally. No acute bony abnormality is seen. No focal infiltrates are noted. Degenerative changes of the acromioclavicular joints are seen bilaterally. IMPRESSION: No acute abnormality noted. Electronically Signed   By: Inez Catalina M.D.   On: 06/15/2016 12:17   Ct Head Wo Contrast  Result Date: 06/15/2016 CLINICAL DATA:  Fall 3 days ago.  Headache.  Initial encounter. EXAM: CT HEAD WITHOUT CONTRAST CT CERVICAL SPINE WITHOUT CONTRAST TECHNIQUE: Multidetector CT imaging of the head and cervical spine was performed following the standard protocol without intravenous contrast. Multiplanar CT image reconstructions of the cervical spine were also generated. COMPARISON:  None. FINDINGS: CT HEAD FINDINGS  Brain: There is no evidence of acute cortical infarct, intracranial hemorrhage, mass, midline shift, or extra-axial fluid collection. Mild cerebral atrophy is within normal limits for age. Patchy bilateral cerebral white matter hypodensities are nonspecific but compatible with chronic small vessel ischemic disease, mild for age. Vascular: Calcified atherosclerosis at the skullbase. No hyperdense vessel. Skull: No fracture or focal osseous lesion. Sinuses/Orbits: Visualized paranasal sinuses and mastoid air cells are clear. Prior bilateral cataract extraction. Other: None. CT CERVICAL SPINE FINDINGS Alignment: Minimal anterolisthesis of C3 on C4 and C4 on C5, likely degenerative and facet mediated. Skull base and vertebrae: No acute fracture or destructive osseous process identified. Soft tissues and spinal canal: No prevertebral fluid or swelling. No visible canal hematoma. Disc levels: Advanced disc degeneration at C6-7 with severe disc space narrowing. Bilateral posterior element ankylosis as well as partial interbody ankylosis from C4-C6. Advanced facet arthrosis bilaterally at C2-3 and C3-4 and on the left at C6-7. No high-grade spinal stenosis or high-grade osseous neural foraminal stenosis. Upper chest: Unremarkable. Other: None. IMPRESSION: 1. No evidence of acute intracranial abnormality. 2. Mild chronic small vessel ischemic disease. 3. No evidence of acute cervical spine fracture or traumatic subluxation. Electronically Signed   By: Logan Bores M.D.   On: 06/15/2016 12:15   Ct Cervical Spine Wo Contrast  Result Date: 06/15/2016 CLINICAL DATA:  Fall 3 days ago.  Headache.  Initial encounter. EXAM: CT HEAD WITHOUT CONTRAST CT CERVICAL SPINE WITHOUT CONTRAST TECHNIQUE: Multidetector CT imaging of the head and cervical spine was performed following the standard protocol without intravenous contrast. Multiplanar CT image reconstructions of the cervical spine were also generated. COMPARISON:  None.  FINDINGS: CT HEAD FINDINGS Brain: There is no evidence of acute cortical infarct, intracranial hemorrhage, mass, midline shift, or extra-axial fluid collection. Mild cerebral atrophy is within normal limits for age. Patchy bilateral cerebral white matter hypodensities are nonspecific but compatible with chronic small vessel ischemic disease, mild for age. Vascular: Calcified atherosclerosis at the skullbase. No hyperdense vessel. Skull: No fracture or focal osseous lesion. Sinuses/Orbits: Visualized paranasal sinuses and mastoid air cells are clear. Prior bilateral cataract extraction. Other: None. CT CERVICAL SPINE FINDINGS Alignment: Minimal anterolisthesis of C3 on C4 and C4 on C5, likely degenerative and facet mediated. Skull base and vertebrae: No acute fracture or destructive osseous process identified. Soft tissues and spinal canal: No prevertebral fluid or swelling. No visible canal hematoma. Disc levels: Advanced disc degeneration at C6-7  with severe disc space narrowing. Bilateral posterior element ankylosis as well as partial interbody ankylosis from C4-C6. Advanced facet arthrosis bilaterally at C2-3 and C3-4 and on the left at C6-7. No high-grade spinal stenosis or high-grade osseous neural foraminal stenosis. Upper chest: Unremarkable. Other: None. IMPRESSION: 1. No evidence of acute intracranial abnormality. 2. Mild chronic small vessel ischemic disease. 3. No evidence of acute cervical spine fracture or traumatic subluxation. Electronically Signed   By: Logan Bores M.D.   On: 06/15/2016 12:15   Dg Hand Complete Left  Result Date: 06/15/2016 CLINICAL DATA:  Status post fall 3 days ago. Persistent left hand pain centered over the fifth metacarpal region. EXAM: LEFT HAND - COMPLETE 3+ VIEW COMPARISON:  None in PACs FINDINGS: The patient has sustained an acute mildly displaced fracture of the distal third of the shaft of the fifth metacarpal. The first through fourth metacarpals are intact. The  phalanges are intact. There is mild degenerative disc space narrowing of the interphalangeal joints. The MCP joints are reasonably well-maintained. The intercarpal and carpometacarpal joints exhibit no acute abnormalities. IMPRESSION: The patient has sustained an acute mildly displaced and angulated fracture of the distal aspect of the shaft of the left fifth metacarpal. There mild osteoarthritic changes of the interphalangeal joints appropriate for age. Electronically Signed   By: David  Martinique M.D.   On: 06/15/2016 12:20   Dg Hip Unilat With Pelvis 2-3 Views Right  Result Date: 06/15/2016 CLINICAL DATA:  Status post fall 3 days ago. Persistent right hip and groin pain. EXAM: DG HIP (WITH OR WITHOUT PELVIS) 2-3V RIGHT COMPARISON:  None in PACs FINDINGS: The bones are subjectively osteopenic. There is no acute iliac bone fracture. There is an 8 comminuted nondisplaced fracture through the midbody of the inferior pubic ramus. A fracture extends obliquely into the parasymphyseal region of the superior pubic ramus on the right. The left hemipelvis is unremarkable. AP and lateral views of the right hip reveal preservation of the joint space. The articular surfaces of the femoral head and acetabulum remain smoothly rounded. The femoral neck, intertrochanteric, and subtrochanteric regions are normal. IMPRESSION: Acute fractures of the right superior and inferior pubic rami. No acute fracture of the right hip. Electronically Signed   By: David  Martinique M.D.   On: 06/15/2016 12:19    Assessment/Plan  1. Multiple closed fractures of pelvis with stable disruption of pelvic ring Improved mobility and pain Continue prn Norco  Continue PT and OT and follow up with Dr. Doran Durand Will check CBC due to pelvic fx while on coumadin She is not ready to return home, needs more work with therapy to establish independence  2. Closed nondisplaced fracture of fifth metacarpal bone of left hand with routine healing, unspecified  portion of metacarpal, subsequent encounter F/U with Dr. Caralyn Guile today ?remove splint  3. Dyspnea on exertion Remains the same in rehab Notes indicate she has diastolic dysfunction as well as fixed obstructive and restrictive lung disease Continue prn albuterol and follow with Dr. Lamonte Sakai as indicated  4. Chronic diastolic heart failure (HCC) Weight stable Stable DOE Continue current diuretic therapy   5. Chronic atrial fibrillation (HCC) Rate controlled with Toprol Continue coumadin for CVA risk reduction INR due in 2 weeks  6. Essential hypertension Controlled Continue norvasc, lasix, metoprolol  7. Slow transit constipation Controlled Continue prn miralax which she uses most days of the week  8. Long term anticoagulation Continue current dose of coumadin for afib and CVA risk reduction at 2 mg  qd Repeat INR in 2 weeks  9. Stasis dermatitis Continue compression hose on in the am and off in the pm Continue moisturizer qd  Family/ staff Communication: discussed with resident/staff  Labs/tests ordered:  CBC Monday 5/14

## 2016-07-13 DIAGNOSIS — Z4789 Encounter for other orthopedic aftercare: Secondary | ICD-10-CM | POA: Diagnosis not present

## 2016-07-13 DIAGNOSIS — R2689 Other abnormalities of gait and mobility: Secondary | ICD-10-CM | POA: Diagnosis not present

## 2016-07-13 DIAGNOSIS — S32810S Multiple fractures of pelvis with stable disruption of pelvic ring, sequela: Secondary | ICD-10-CM | POA: Diagnosis not present

## 2016-07-13 DIAGNOSIS — D509 Iron deficiency anemia, unspecified: Secondary | ICD-10-CM | POA: Diagnosis not present

## 2016-07-13 DIAGNOSIS — R278 Other lack of coordination: Secondary | ICD-10-CM | POA: Diagnosis not present

## 2016-07-13 DIAGNOSIS — M25542 Pain in joints of left hand: Secondary | ICD-10-CM | POA: Diagnosis not present

## 2016-07-13 DIAGNOSIS — R071 Chest pain on breathing: Secondary | ICD-10-CM | POA: Diagnosis not present

## 2016-07-14 DIAGNOSIS — R2689 Other abnormalities of gait and mobility: Secondary | ICD-10-CM | POA: Diagnosis not present

## 2016-07-14 DIAGNOSIS — M25542 Pain in joints of left hand: Secondary | ICD-10-CM | POA: Diagnosis not present

## 2016-07-14 DIAGNOSIS — S32810S Multiple fractures of pelvis with stable disruption of pelvic ring, sequela: Secondary | ICD-10-CM | POA: Diagnosis not present

## 2016-07-14 DIAGNOSIS — C44622 Squamous cell carcinoma of skin of right upper limb, including shoulder: Secondary | ICD-10-CM | POA: Diagnosis not present

## 2016-07-14 DIAGNOSIS — Z4789 Encounter for other orthopedic aftercare: Secondary | ICD-10-CM | POA: Diagnosis not present

## 2016-07-14 DIAGNOSIS — L570 Actinic keratosis: Secondary | ICD-10-CM | POA: Diagnosis not present

## 2016-07-14 DIAGNOSIS — R278 Other lack of coordination: Secondary | ICD-10-CM | POA: Diagnosis not present

## 2016-07-14 DIAGNOSIS — L0889 Other specified local infections of the skin and subcutaneous tissue: Secondary | ICD-10-CM | POA: Diagnosis not present

## 2016-07-14 DIAGNOSIS — R071 Chest pain on breathing: Secondary | ICD-10-CM | POA: Diagnosis not present

## 2016-07-14 DIAGNOSIS — C44729 Squamous cell carcinoma of skin of left lower limb, including hip: Secondary | ICD-10-CM | POA: Diagnosis not present

## 2016-07-15 DIAGNOSIS — R278 Other lack of coordination: Secondary | ICD-10-CM | POA: Diagnosis not present

## 2016-07-15 DIAGNOSIS — Z4789 Encounter for other orthopedic aftercare: Secondary | ICD-10-CM | POA: Diagnosis not present

## 2016-07-15 DIAGNOSIS — R2689 Other abnormalities of gait and mobility: Secondary | ICD-10-CM | POA: Diagnosis not present

## 2016-07-15 DIAGNOSIS — S32810S Multiple fractures of pelvis with stable disruption of pelvic ring, sequela: Secondary | ICD-10-CM | POA: Diagnosis not present

## 2016-07-15 DIAGNOSIS — R071 Chest pain on breathing: Secondary | ICD-10-CM | POA: Diagnosis not present

## 2016-07-15 DIAGNOSIS — M25542 Pain in joints of left hand: Secondary | ICD-10-CM | POA: Diagnosis not present

## 2016-07-16 DIAGNOSIS — M25542 Pain in joints of left hand: Secondary | ICD-10-CM | POA: Diagnosis not present

## 2016-07-16 DIAGNOSIS — Z4789 Encounter for other orthopedic aftercare: Secondary | ICD-10-CM | POA: Diagnosis not present

## 2016-07-16 DIAGNOSIS — R278 Other lack of coordination: Secondary | ICD-10-CM | POA: Diagnosis not present

## 2016-07-16 DIAGNOSIS — R2689 Other abnormalities of gait and mobility: Secondary | ICD-10-CM | POA: Diagnosis not present

## 2016-07-16 DIAGNOSIS — S32810S Multiple fractures of pelvis with stable disruption of pelvic ring, sequela: Secondary | ICD-10-CM | POA: Diagnosis not present

## 2016-07-16 DIAGNOSIS — R071 Chest pain on breathing: Secondary | ICD-10-CM | POA: Diagnosis not present

## 2016-07-17 DIAGNOSIS — M25542 Pain in joints of left hand: Secondary | ICD-10-CM | POA: Diagnosis not present

## 2016-07-17 DIAGNOSIS — Z4789 Encounter for other orthopedic aftercare: Secondary | ICD-10-CM | POA: Diagnosis not present

## 2016-07-17 DIAGNOSIS — S32810S Multiple fractures of pelvis with stable disruption of pelvic ring, sequela: Secondary | ICD-10-CM | POA: Diagnosis not present

## 2016-07-17 DIAGNOSIS — R278 Other lack of coordination: Secondary | ICD-10-CM | POA: Diagnosis not present

## 2016-07-17 DIAGNOSIS — R071 Chest pain on breathing: Secondary | ICD-10-CM | POA: Diagnosis not present

## 2016-07-17 DIAGNOSIS — R2689 Other abnormalities of gait and mobility: Secondary | ICD-10-CM | POA: Diagnosis not present

## 2016-07-20 DIAGNOSIS — R2689 Other abnormalities of gait and mobility: Secondary | ICD-10-CM | POA: Diagnosis not present

## 2016-07-20 DIAGNOSIS — M25542 Pain in joints of left hand: Secondary | ICD-10-CM | POA: Diagnosis not present

## 2016-07-20 DIAGNOSIS — S32810S Multiple fractures of pelvis with stable disruption of pelvic ring, sequela: Secondary | ICD-10-CM | POA: Diagnosis not present

## 2016-07-20 DIAGNOSIS — R071 Chest pain on breathing: Secondary | ICD-10-CM | POA: Diagnosis not present

## 2016-07-20 DIAGNOSIS — Z4789 Encounter for other orthopedic aftercare: Secondary | ICD-10-CM | POA: Diagnosis not present

## 2016-07-20 DIAGNOSIS — R278 Other lack of coordination: Secondary | ICD-10-CM | POA: Diagnosis not present

## 2016-07-21 DIAGNOSIS — R278 Other lack of coordination: Secondary | ICD-10-CM | POA: Diagnosis not present

## 2016-07-21 DIAGNOSIS — S32810S Multiple fractures of pelvis with stable disruption of pelvic ring, sequela: Secondary | ICD-10-CM | POA: Diagnosis not present

## 2016-07-21 DIAGNOSIS — M25542 Pain in joints of left hand: Secondary | ICD-10-CM | POA: Diagnosis not present

## 2016-07-21 DIAGNOSIS — R2689 Other abnormalities of gait and mobility: Secondary | ICD-10-CM | POA: Diagnosis not present

## 2016-07-21 DIAGNOSIS — Z4789 Encounter for other orthopedic aftercare: Secondary | ICD-10-CM | POA: Diagnosis not present

## 2016-07-21 DIAGNOSIS — R071 Chest pain on breathing: Secondary | ICD-10-CM | POA: Diagnosis not present

## 2016-07-22 DIAGNOSIS — R071 Chest pain on breathing: Secondary | ICD-10-CM | POA: Diagnosis not present

## 2016-07-22 DIAGNOSIS — Z4789 Encounter for other orthopedic aftercare: Secondary | ICD-10-CM | POA: Diagnosis not present

## 2016-07-22 DIAGNOSIS — R2689 Other abnormalities of gait and mobility: Secondary | ICD-10-CM | POA: Diagnosis not present

## 2016-07-22 DIAGNOSIS — M25542 Pain in joints of left hand: Secondary | ICD-10-CM | POA: Diagnosis not present

## 2016-07-22 DIAGNOSIS — R278 Other lack of coordination: Secondary | ICD-10-CM | POA: Diagnosis not present

## 2016-07-22 DIAGNOSIS — S32810S Multiple fractures of pelvis with stable disruption of pelvic ring, sequela: Secondary | ICD-10-CM | POA: Diagnosis not present

## 2016-07-23 DIAGNOSIS — R071 Chest pain on breathing: Secondary | ICD-10-CM | POA: Diagnosis not present

## 2016-07-23 DIAGNOSIS — R278 Other lack of coordination: Secondary | ICD-10-CM | POA: Diagnosis not present

## 2016-07-23 DIAGNOSIS — R2689 Other abnormalities of gait and mobility: Secondary | ICD-10-CM | POA: Diagnosis not present

## 2016-07-23 DIAGNOSIS — I482 Chronic atrial fibrillation: Secondary | ICD-10-CM | POA: Diagnosis not present

## 2016-07-23 DIAGNOSIS — S32810S Multiple fractures of pelvis with stable disruption of pelvic ring, sequela: Secondary | ICD-10-CM | POA: Diagnosis not present

## 2016-07-23 DIAGNOSIS — M25542 Pain in joints of left hand: Secondary | ICD-10-CM | POA: Diagnosis not present

## 2016-07-23 DIAGNOSIS — I4891 Unspecified atrial fibrillation: Secondary | ICD-10-CM | POA: Diagnosis not present

## 2016-07-23 DIAGNOSIS — Z4789 Encounter for other orthopedic aftercare: Secondary | ICD-10-CM | POA: Diagnosis not present

## 2016-07-24 DIAGNOSIS — S32810S Multiple fractures of pelvis with stable disruption of pelvic ring, sequela: Secondary | ICD-10-CM | POA: Diagnosis not present

## 2016-07-24 DIAGNOSIS — R2689 Other abnormalities of gait and mobility: Secondary | ICD-10-CM | POA: Diagnosis not present

## 2016-07-24 DIAGNOSIS — Z4789 Encounter for other orthopedic aftercare: Secondary | ICD-10-CM | POA: Diagnosis not present

## 2016-07-24 DIAGNOSIS — M25542 Pain in joints of left hand: Secondary | ICD-10-CM | POA: Diagnosis not present

## 2016-07-24 DIAGNOSIS — R071 Chest pain on breathing: Secondary | ICD-10-CM | POA: Diagnosis not present

## 2016-07-24 DIAGNOSIS — R278 Other lack of coordination: Secondary | ICD-10-CM | POA: Diagnosis not present

## 2016-07-26 DIAGNOSIS — S32810S Multiple fractures of pelvis with stable disruption of pelvic ring, sequela: Secondary | ICD-10-CM | POA: Diagnosis not present

## 2016-07-26 DIAGNOSIS — R071 Chest pain on breathing: Secondary | ICD-10-CM | POA: Diagnosis not present

## 2016-07-26 DIAGNOSIS — R2689 Other abnormalities of gait and mobility: Secondary | ICD-10-CM | POA: Diagnosis not present

## 2016-07-26 DIAGNOSIS — Z4789 Encounter for other orthopedic aftercare: Secondary | ICD-10-CM | POA: Diagnosis not present

## 2016-07-26 DIAGNOSIS — M25542 Pain in joints of left hand: Secondary | ICD-10-CM | POA: Diagnosis not present

## 2016-07-26 DIAGNOSIS — R278 Other lack of coordination: Secondary | ICD-10-CM | POA: Diagnosis not present

## 2016-07-27 DIAGNOSIS — M25542 Pain in joints of left hand: Secondary | ICD-10-CM | POA: Diagnosis not present

## 2016-07-27 DIAGNOSIS — Z4789 Encounter for other orthopedic aftercare: Secondary | ICD-10-CM | POA: Diagnosis not present

## 2016-07-27 DIAGNOSIS — R071 Chest pain on breathing: Secondary | ICD-10-CM | POA: Diagnosis not present

## 2016-07-27 DIAGNOSIS — S32810S Multiple fractures of pelvis with stable disruption of pelvic ring, sequela: Secondary | ICD-10-CM | POA: Diagnosis not present

## 2016-07-27 DIAGNOSIS — R278 Other lack of coordination: Secondary | ICD-10-CM | POA: Diagnosis not present

## 2016-07-27 DIAGNOSIS — R2689 Other abnormalities of gait and mobility: Secondary | ICD-10-CM | POA: Diagnosis not present

## 2016-07-28 DIAGNOSIS — R278 Other lack of coordination: Secondary | ICD-10-CM | POA: Diagnosis not present

## 2016-07-28 DIAGNOSIS — C44729 Squamous cell carcinoma of skin of left lower limb, including hip: Secondary | ICD-10-CM | POA: Diagnosis not present

## 2016-07-28 DIAGNOSIS — R071 Chest pain on breathing: Secondary | ICD-10-CM | POA: Diagnosis not present

## 2016-07-28 DIAGNOSIS — S32810S Multiple fractures of pelvis with stable disruption of pelvic ring, sequela: Secondary | ICD-10-CM | POA: Diagnosis not present

## 2016-07-28 DIAGNOSIS — Z4789 Encounter for other orthopedic aftercare: Secondary | ICD-10-CM | POA: Diagnosis not present

## 2016-07-28 DIAGNOSIS — M25542 Pain in joints of left hand: Secondary | ICD-10-CM | POA: Diagnosis not present

## 2016-07-28 DIAGNOSIS — R2689 Other abnormalities of gait and mobility: Secondary | ICD-10-CM | POA: Diagnosis not present

## 2016-07-28 DIAGNOSIS — C44622 Squamous cell carcinoma of skin of right upper limb, including shoulder: Secondary | ICD-10-CM | POA: Diagnosis not present

## 2016-07-29 DIAGNOSIS — S32591D Other specified fracture of right pubis, subsequent encounter for fracture with routine healing: Secondary | ICD-10-CM | POA: Diagnosis not present

## 2016-07-30 DIAGNOSIS — C44622 Squamous cell carcinoma of skin of right upper limb, including shoulder: Secondary | ICD-10-CM | POA: Diagnosis not present

## 2016-07-30 DIAGNOSIS — Z85828 Personal history of other malignant neoplasm of skin: Secondary | ICD-10-CM | POA: Diagnosis not present

## 2016-07-30 DIAGNOSIS — Z4789 Encounter for other orthopedic aftercare: Secondary | ICD-10-CM | POA: Diagnosis not present

## 2016-07-30 DIAGNOSIS — R2689 Other abnormalities of gait and mobility: Secondary | ICD-10-CM | POA: Diagnosis not present

## 2016-07-30 DIAGNOSIS — M25542 Pain in joints of left hand: Secondary | ICD-10-CM | POA: Diagnosis not present

## 2016-07-30 DIAGNOSIS — R278 Other lack of coordination: Secondary | ICD-10-CM | POA: Diagnosis not present

## 2016-07-30 DIAGNOSIS — S32810S Multiple fractures of pelvis with stable disruption of pelvic ring, sequela: Secondary | ICD-10-CM | POA: Diagnosis not present

## 2016-07-30 DIAGNOSIS — R071 Chest pain on breathing: Secondary | ICD-10-CM | POA: Diagnosis not present

## 2016-07-31 DIAGNOSIS — S62317S Displaced fracture of base of fifth metacarpal bone. left hand, sequela: Secondary | ICD-10-CM | POA: Diagnosis not present

## 2016-07-31 DIAGNOSIS — S32810S Multiple fractures of pelvis with stable disruption of pelvic ring, sequela: Secondary | ICD-10-CM | POA: Diagnosis not present

## 2016-07-31 DIAGNOSIS — M25542 Pain in joints of left hand: Secondary | ICD-10-CM | POA: Diagnosis not present

## 2016-07-31 DIAGNOSIS — S32810A Multiple fractures of pelvis with stable disruption of pelvic ring, initial encounter for closed fracture: Secondary | ICD-10-CM | POA: Diagnosis not present

## 2016-07-31 DIAGNOSIS — R278 Other lack of coordination: Secondary | ICD-10-CM | POA: Diagnosis not present

## 2016-07-31 DIAGNOSIS — Z9181 History of falling: Secondary | ICD-10-CM | POA: Diagnosis not present

## 2016-07-31 DIAGNOSIS — R2689 Other abnormalities of gait and mobility: Secondary | ICD-10-CM | POA: Diagnosis not present

## 2016-07-31 DIAGNOSIS — S62318S Displaced fracture of base of other metacarpal bone, sequela: Secondary | ICD-10-CM | POA: Diagnosis not present

## 2016-07-31 DIAGNOSIS — R071 Chest pain on breathing: Secondary | ICD-10-CM | POA: Diagnosis not present

## 2016-07-31 DIAGNOSIS — Z4789 Encounter for other orthopedic aftercare: Secondary | ICD-10-CM | POA: Diagnosis not present

## 2016-08-03 DIAGNOSIS — R2689 Other abnormalities of gait and mobility: Secondary | ICD-10-CM | POA: Diagnosis not present

## 2016-08-03 DIAGNOSIS — S32810S Multiple fractures of pelvis with stable disruption of pelvic ring, sequela: Secondary | ICD-10-CM | POA: Diagnosis not present

## 2016-08-03 DIAGNOSIS — Z4789 Encounter for other orthopedic aftercare: Secondary | ICD-10-CM | POA: Diagnosis not present

## 2016-08-03 DIAGNOSIS — R071 Chest pain on breathing: Secondary | ICD-10-CM | POA: Diagnosis not present

## 2016-08-03 DIAGNOSIS — M25542 Pain in joints of left hand: Secondary | ICD-10-CM | POA: Diagnosis not present

## 2016-08-03 DIAGNOSIS — R278 Other lack of coordination: Secondary | ICD-10-CM | POA: Diagnosis not present

## 2016-08-04 DIAGNOSIS — R071 Chest pain on breathing: Secondary | ICD-10-CM | POA: Diagnosis not present

## 2016-08-04 DIAGNOSIS — Z4789 Encounter for other orthopedic aftercare: Secondary | ICD-10-CM | POA: Diagnosis not present

## 2016-08-04 DIAGNOSIS — S32810S Multiple fractures of pelvis with stable disruption of pelvic ring, sequela: Secondary | ICD-10-CM | POA: Diagnosis not present

## 2016-08-04 DIAGNOSIS — M25542 Pain in joints of left hand: Secondary | ICD-10-CM | POA: Diagnosis not present

## 2016-08-04 DIAGNOSIS — R278 Other lack of coordination: Secondary | ICD-10-CM | POA: Diagnosis not present

## 2016-08-04 DIAGNOSIS — R2689 Other abnormalities of gait and mobility: Secondary | ICD-10-CM | POA: Diagnosis not present

## 2016-08-05 DIAGNOSIS — S32810S Multiple fractures of pelvis with stable disruption of pelvic ring, sequela: Secondary | ICD-10-CM | POA: Diagnosis not present

## 2016-08-05 DIAGNOSIS — R278 Other lack of coordination: Secondary | ICD-10-CM | POA: Diagnosis not present

## 2016-08-05 DIAGNOSIS — Z4789 Encounter for other orthopedic aftercare: Secondary | ICD-10-CM | POA: Diagnosis not present

## 2016-08-05 DIAGNOSIS — R2689 Other abnormalities of gait and mobility: Secondary | ICD-10-CM | POA: Diagnosis not present

## 2016-08-05 DIAGNOSIS — M25542 Pain in joints of left hand: Secondary | ICD-10-CM | POA: Diagnosis not present

## 2016-08-05 DIAGNOSIS — R071 Chest pain on breathing: Secondary | ICD-10-CM | POA: Diagnosis not present

## 2016-08-06 DIAGNOSIS — M25542 Pain in joints of left hand: Secondary | ICD-10-CM | POA: Diagnosis not present

## 2016-08-06 DIAGNOSIS — I4891 Unspecified atrial fibrillation: Secondary | ICD-10-CM | POA: Diagnosis not present

## 2016-08-06 DIAGNOSIS — Z4789 Encounter for other orthopedic aftercare: Secondary | ICD-10-CM | POA: Diagnosis not present

## 2016-08-06 DIAGNOSIS — R2689 Other abnormalities of gait and mobility: Secondary | ICD-10-CM | POA: Diagnosis not present

## 2016-08-06 DIAGNOSIS — R071 Chest pain on breathing: Secondary | ICD-10-CM | POA: Diagnosis not present

## 2016-08-06 DIAGNOSIS — R278 Other lack of coordination: Secondary | ICD-10-CM | POA: Diagnosis not present

## 2016-08-06 DIAGNOSIS — Z7901 Long term (current) use of anticoagulants: Secondary | ICD-10-CM | POA: Diagnosis not present

## 2016-08-06 DIAGNOSIS — S32810S Multiple fractures of pelvis with stable disruption of pelvic ring, sequela: Secondary | ICD-10-CM | POA: Diagnosis not present

## 2016-08-07 DIAGNOSIS — R071 Chest pain on breathing: Secondary | ICD-10-CM | POA: Diagnosis not present

## 2016-08-07 DIAGNOSIS — Z4789 Encounter for other orthopedic aftercare: Secondary | ICD-10-CM | POA: Diagnosis not present

## 2016-08-07 DIAGNOSIS — M25542 Pain in joints of left hand: Secondary | ICD-10-CM | POA: Diagnosis not present

## 2016-08-07 DIAGNOSIS — S32810S Multiple fractures of pelvis with stable disruption of pelvic ring, sequela: Secondary | ICD-10-CM | POA: Diagnosis not present

## 2016-08-07 DIAGNOSIS — R278 Other lack of coordination: Secondary | ICD-10-CM | POA: Diagnosis not present

## 2016-08-07 DIAGNOSIS — S62317D Displaced fracture of base of fifth metacarpal bone. left hand, subsequent encounter for fracture with routine healing: Secondary | ICD-10-CM | POA: Diagnosis not present

## 2016-08-07 DIAGNOSIS — R2689 Other abnormalities of gait and mobility: Secondary | ICD-10-CM | POA: Diagnosis not present

## 2016-08-08 DIAGNOSIS — R2689 Other abnormalities of gait and mobility: Secondary | ICD-10-CM | POA: Diagnosis not present

## 2016-08-08 DIAGNOSIS — Z4789 Encounter for other orthopedic aftercare: Secondary | ICD-10-CM | POA: Diagnosis not present

## 2016-08-08 DIAGNOSIS — S32810S Multiple fractures of pelvis with stable disruption of pelvic ring, sequela: Secondary | ICD-10-CM | POA: Diagnosis not present

## 2016-08-08 DIAGNOSIS — R071 Chest pain on breathing: Secondary | ICD-10-CM | POA: Diagnosis not present

## 2016-08-08 DIAGNOSIS — M25542 Pain in joints of left hand: Secondary | ICD-10-CM | POA: Diagnosis not present

## 2016-08-08 DIAGNOSIS — R278 Other lack of coordination: Secondary | ICD-10-CM | POA: Diagnosis not present

## 2016-08-09 DIAGNOSIS — S32810S Multiple fractures of pelvis with stable disruption of pelvic ring, sequela: Secondary | ICD-10-CM | POA: Diagnosis not present

## 2016-08-09 DIAGNOSIS — R071 Chest pain on breathing: Secondary | ICD-10-CM | POA: Diagnosis not present

## 2016-08-09 DIAGNOSIS — R2689 Other abnormalities of gait and mobility: Secondary | ICD-10-CM | POA: Diagnosis not present

## 2016-08-09 DIAGNOSIS — R278 Other lack of coordination: Secondary | ICD-10-CM | POA: Diagnosis not present

## 2016-08-09 DIAGNOSIS — M25542 Pain in joints of left hand: Secondary | ICD-10-CM | POA: Diagnosis not present

## 2016-08-09 DIAGNOSIS — Z4789 Encounter for other orthopedic aftercare: Secondary | ICD-10-CM | POA: Diagnosis not present

## 2016-08-10 DIAGNOSIS — R278 Other lack of coordination: Secondary | ICD-10-CM | POA: Diagnosis not present

## 2016-08-10 DIAGNOSIS — M25542 Pain in joints of left hand: Secondary | ICD-10-CM | POA: Diagnosis not present

## 2016-08-10 DIAGNOSIS — R071 Chest pain on breathing: Secondary | ICD-10-CM | POA: Diagnosis not present

## 2016-08-10 DIAGNOSIS — Z4789 Encounter for other orthopedic aftercare: Secondary | ICD-10-CM | POA: Diagnosis not present

## 2016-08-10 DIAGNOSIS — R2689 Other abnormalities of gait and mobility: Secondary | ICD-10-CM | POA: Diagnosis not present

## 2016-08-10 DIAGNOSIS — S32810S Multiple fractures of pelvis with stable disruption of pelvic ring, sequela: Secondary | ICD-10-CM | POA: Diagnosis not present

## 2016-08-11 DIAGNOSIS — R2689 Other abnormalities of gait and mobility: Secondary | ICD-10-CM | POA: Diagnosis not present

## 2016-08-11 DIAGNOSIS — Z4789 Encounter for other orthopedic aftercare: Secondary | ICD-10-CM | POA: Diagnosis not present

## 2016-08-11 DIAGNOSIS — R071 Chest pain on breathing: Secondary | ICD-10-CM | POA: Diagnosis not present

## 2016-08-11 DIAGNOSIS — R278 Other lack of coordination: Secondary | ICD-10-CM | POA: Diagnosis not present

## 2016-08-11 DIAGNOSIS — M25542 Pain in joints of left hand: Secondary | ICD-10-CM | POA: Diagnosis not present

## 2016-08-11 DIAGNOSIS — S32810S Multiple fractures of pelvis with stable disruption of pelvic ring, sequela: Secondary | ICD-10-CM | POA: Diagnosis not present

## 2016-08-12 DIAGNOSIS — S32810S Multiple fractures of pelvis with stable disruption of pelvic ring, sequela: Secondary | ICD-10-CM | POA: Diagnosis not present

## 2016-08-12 DIAGNOSIS — R2689 Other abnormalities of gait and mobility: Secondary | ICD-10-CM | POA: Diagnosis not present

## 2016-08-12 DIAGNOSIS — M25542 Pain in joints of left hand: Secondary | ICD-10-CM | POA: Diagnosis not present

## 2016-08-12 DIAGNOSIS — Z4789 Encounter for other orthopedic aftercare: Secondary | ICD-10-CM | POA: Diagnosis not present

## 2016-08-12 DIAGNOSIS — R071 Chest pain on breathing: Secondary | ICD-10-CM | POA: Diagnosis not present

## 2016-08-12 DIAGNOSIS — R278 Other lack of coordination: Secondary | ICD-10-CM | POA: Diagnosis not present

## 2016-08-13 DIAGNOSIS — Z4789 Encounter for other orthopedic aftercare: Secondary | ICD-10-CM | POA: Diagnosis not present

## 2016-08-13 DIAGNOSIS — R071 Chest pain on breathing: Secondary | ICD-10-CM | POA: Diagnosis not present

## 2016-08-13 DIAGNOSIS — S32810S Multiple fractures of pelvis with stable disruption of pelvic ring, sequela: Secondary | ICD-10-CM | POA: Diagnosis not present

## 2016-08-13 DIAGNOSIS — M25542 Pain in joints of left hand: Secondary | ICD-10-CM | POA: Diagnosis not present

## 2016-08-13 DIAGNOSIS — R2689 Other abnormalities of gait and mobility: Secondary | ICD-10-CM | POA: Diagnosis not present

## 2016-08-13 DIAGNOSIS — R278 Other lack of coordination: Secondary | ICD-10-CM | POA: Diagnosis not present

## 2016-08-14 DIAGNOSIS — R278 Other lack of coordination: Secondary | ICD-10-CM | POA: Diagnosis not present

## 2016-08-14 DIAGNOSIS — M25542 Pain in joints of left hand: Secondary | ICD-10-CM | POA: Diagnosis not present

## 2016-08-14 DIAGNOSIS — S32810S Multiple fractures of pelvis with stable disruption of pelvic ring, sequela: Secondary | ICD-10-CM | POA: Diagnosis not present

## 2016-08-14 DIAGNOSIS — R2689 Other abnormalities of gait and mobility: Secondary | ICD-10-CM | POA: Diagnosis not present

## 2016-08-14 DIAGNOSIS — R071 Chest pain on breathing: Secondary | ICD-10-CM | POA: Diagnosis not present

## 2016-08-14 DIAGNOSIS — Z4789 Encounter for other orthopedic aftercare: Secondary | ICD-10-CM | POA: Diagnosis not present

## 2016-08-15 DIAGNOSIS — R071 Chest pain on breathing: Secondary | ICD-10-CM | POA: Diagnosis not present

## 2016-08-15 DIAGNOSIS — R278 Other lack of coordination: Secondary | ICD-10-CM | POA: Diagnosis not present

## 2016-08-15 DIAGNOSIS — S32810S Multiple fractures of pelvis with stable disruption of pelvic ring, sequela: Secondary | ICD-10-CM | POA: Diagnosis not present

## 2016-08-15 DIAGNOSIS — Z4789 Encounter for other orthopedic aftercare: Secondary | ICD-10-CM | POA: Diagnosis not present

## 2016-08-15 DIAGNOSIS — R2689 Other abnormalities of gait and mobility: Secondary | ICD-10-CM | POA: Diagnosis not present

## 2016-08-15 DIAGNOSIS — M25542 Pain in joints of left hand: Secondary | ICD-10-CM | POA: Diagnosis not present

## 2016-08-17 DIAGNOSIS — M25542 Pain in joints of left hand: Secondary | ICD-10-CM | POA: Diagnosis not present

## 2016-08-17 DIAGNOSIS — R278 Other lack of coordination: Secondary | ICD-10-CM | POA: Diagnosis not present

## 2016-08-17 DIAGNOSIS — Z4789 Encounter for other orthopedic aftercare: Secondary | ICD-10-CM | POA: Diagnosis not present

## 2016-08-17 DIAGNOSIS — S32810S Multiple fractures of pelvis with stable disruption of pelvic ring, sequela: Secondary | ICD-10-CM | POA: Diagnosis not present

## 2016-08-17 DIAGNOSIS — R071 Chest pain on breathing: Secondary | ICD-10-CM | POA: Diagnosis not present

## 2016-08-17 DIAGNOSIS — R2689 Other abnormalities of gait and mobility: Secondary | ICD-10-CM | POA: Diagnosis not present

## 2016-08-18 DIAGNOSIS — R278 Other lack of coordination: Secondary | ICD-10-CM | POA: Diagnosis not present

## 2016-08-18 DIAGNOSIS — Z4789 Encounter for other orthopedic aftercare: Secondary | ICD-10-CM | POA: Diagnosis not present

## 2016-08-18 DIAGNOSIS — S32810S Multiple fractures of pelvis with stable disruption of pelvic ring, sequela: Secondary | ICD-10-CM | POA: Diagnosis not present

## 2016-08-18 DIAGNOSIS — R2689 Other abnormalities of gait and mobility: Secondary | ICD-10-CM | POA: Diagnosis not present

## 2016-08-18 DIAGNOSIS — C44622 Squamous cell carcinoma of skin of right upper limb, including shoulder: Secondary | ICD-10-CM | POA: Diagnosis not present

## 2016-08-18 DIAGNOSIS — R071 Chest pain on breathing: Secondary | ICD-10-CM | POA: Diagnosis not present

## 2016-08-18 DIAGNOSIS — M25542 Pain in joints of left hand: Secondary | ICD-10-CM | POA: Diagnosis not present

## 2016-08-19 DIAGNOSIS — M25542 Pain in joints of left hand: Secondary | ICD-10-CM | POA: Diagnosis not present

## 2016-08-19 DIAGNOSIS — R278 Other lack of coordination: Secondary | ICD-10-CM | POA: Diagnosis not present

## 2016-08-19 DIAGNOSIS — S32810S Multiple fractures of pelvis with stable disruption of pelvic ring, sequela: Secondary | ICD-10-CM | POA: Diagnosis not present

## 2016-08-19 DIAGNOSIS — Z4789 Encounter for other orthopedic aftercare: Secondary | ICD-10-CM | POA: Diagnosis not present

## 2016-08-19 DIAGNOSIS — R2689 Other abnormalities of gait and mobility: Secondary | ICD-10-CM | POA: Diagnosis not present

## 2016-08-19 DIAGNOSIS — R071 Chest pain on breathing: Secondary | ICD-10-CM | POA: Diagnosis not present

## 2016-08-20 DIAGNOSIS — M25542 Pain in joints of left hand: Secondary | ICD-10-CM | POA: Diagnosis not present

## 2016-08-20 DIAGNOSIS — Z4789 Encounter for other orthopedic aftercare: Secondary | ICD-10-CM | POA: Diagnosis not present

## 2016-08-20 DIAGNOSIS — R278 Other lack of coordination: Secondary | ICD-10-CM | POA: Diagnosis not present

## 2016-08-20 DIAGNOSIS — S32810S Multiple fractures of pelvis with stable disruption of pelvic ring, sequela: Secondary | ICD-10-CM | POA: Diagnosis not present

## 2016-08-20 DIAGNOSIS — R071 Chest pain on breathing: Secondary | ICD-10-CM | POA: Diagnosis not present

## 2016-08-20 DIAGNOSIS — R2689 Other abnormalities of gait and mobility: Secondary | ICD-10-CM | POA: Diagnosis not present

## 2016-08-21 DIAGNOSIS — Z4789 Encounter for other orthopedic aftercare: Secondary | ICD-10-CM | POA: Diagnosis not present

## 2016-08-21 DIAGNOSIS — R071 Chest pain on breathing: Secondary | ICD-10-CM | POA: Diagnosis not present

## 2016-08-21 DIAGNOSIS — R278 Other lack of coordination: Secondary | ICD-10-CM | POA: Diagnosis not present

## 2016-08-21 DIAGNOSIS — R2689 Other abnormalities of gait and mobility: Secondary | ICD-10-CM | POA: Diagnosis not present

## 2016-08-21 DIAGNOSIS — M25542 Pain in joints of left hand: Secondary | ICD-10-CM | POA: Diagnosis not present

## 2016-08-21 DIAGNOSIS — S32810S Multiple fractures of pelvis with stable disruption of pelvic ring, sequela: Secondary | ICD-10-CM | POA: Diagnosis not present

## 2016-08-31 DIAGNOSIS — R071 Chest pain on breathing: Secondary | ICD-10-CM | POA: Diagnosis not present

## 2016-08-31 DIAGNOSIS — S32810S Multiple fractures of pelvis with stable disruption of pelvic ring, sequela: Secondary | ICD-10-CM | POA: Diagnosis not present

## 2016-08-31 DIAGNOSIS — S62318S Displaced fracture of base of other metacarpal bone, sequela: Secondary | ICD-10-CM | POA: Diagnosis not present

## 2016-08-31 DIAGNOSIS — S32810A Multiple fractures of pelvis with stable disruption of pelvic ring, initial encounter for closed fracture: Secondary | ICD-10-CM | POA: Diagnosis not present

## 2016-08-31 DIAGNOSIS — M25542 Pain in joints of left hand: Secondary | ICD-10-CM | POA: Diagnosis not present

## 2016-08-31 DIAGNOSIS — Z4789 Encounter for other orthopedic aftercare: Secondary | ICD-10-CM | POA: Diagnosis not present

## 2016-09-02 DIAGNOSIS — S62318S Displaced fracture of base of other metacarpal bone, sequela: Secondary | ICD-10-CM | POA: Diagnosis not present

## 2016-09-02 DIAGNOSIS — R071 Chest pain on breathing: Secondary | ICD-10-CM | POA: Diagnosis not present

## 2016-09-02 DIAGNOSIS — S32810A Multiple fractures of pelvis with stable disruption of pelvic ring, initial encounter for closed fracture: Secondary | ICD-10-CM | POA: Diagnosis not present

## 2016-09-02 DIAGNOSIS — Z4789 Encounter for other orthopedic aftercare: Secondary | ICD-10-CM | POA: Diagnosis not present

## 2016-09-02 DIAGNOSIS — S32810S Multiple fractures of pelvis with stable disruption of pelvic ring, sequela: Secondary | ICD-10-CM | POA: Diagnosis not present

## 2016-09-02 DIAGNOSIS — M25542 Pain in joints of left hand: Secondary | ICD-10-CM | POA: Diagnosis not present

## 2016-09-23 DIAGNOSIS — I480 Paroxysmal atrial fibrillation: Secondary | ICD-10-CM | POA: Diagnosis not present

## 2016-09-23 DIAGNOSIS — Z7901 Long term (current) use of anticoagulants: Secondary | ICD-10-CM | POA: Diagnosis not present

## 2016-10-21 ENCOUNTER — Other Ambulatory Visit: Payer: Self-pay | Admitting: Interventional Cardiology

## 2016-10-27 DIAGNOSIS — Z7901 Long term (current) use of anticoagulants: Secondary | ICD-10-CM | POA: Diagnosis not present

## 2016-10-27 DIAGNOSIS — I480 Paroxysmal atrial fibrillation: Secondary | ICD-10-CM | POA: Diagnosis not present

## 2016-11-17 ENCOUNTER — Other Ambulatory Visit: Payer: Self-pay | Admitting: Interventional Cardiology

## 2016-11-17 DIAGNOSIS — C44329 Squamous cell carcinoma of skin of other parts of face: Secondary | ICD-10-CM | POA: Diagnosis not present

## 2016-11-17 DIAGNOSIS — L570 Actinic keratosis: Secondary | ICD-10-CM | POA: Diagnosis not present

## 2016-11-17 DIAGNOSIS — L814 Other melanin hyperpigmentation: Secondary | ICD-10-CM | POA: Diagnosis not present

## 2016-11-17 DIAGNOSIS — D485 Neoplasm of uncertain behavior of skin: Secondary | ICD-10-CM | POA: Diagnosis not present

## 2016-12-02 DIAGNOSIS — Z7901 Long term (current) use of anticoagulants: Secondary | ICD-10-CM | POA: Diagnosis not present

## 2016-12-02 DIAGNOSIS — I480 Paroxysmal atrial fibrillation: Secondary | ICD-10-CM | POA: Diagnosis not present

## 2016-12-02 DIAGNOSIS — Z23 Encounter for immunization: Secondary | ICD-10-CM | POA: Diagnosis not present

## 2016-12-03 ENCOUNTER — Other Ambulatory Visit: Payer: Self-pay | Admitting: Interventional Cardiology

## 2016-12-03 MED ORDER — FUROSEMIDE 40 MG PO TABS: 80.0000 mg | ORAL_TABLET | Freq: Every day | ORAL | 0 refills | Status: DC

## 2016-12-03 NOTE — Telephone Encounter (Signed)
Rx(s) sent to pharmacy electronically. Patient has PAOV 12/17/16

## 2016-12-09 ENCOUNTER — Other Ambulatory Visit: Payer: Self-pay | Admitting: *Deleted

## 2016-12-09 MED ORDER — POTASSIUM CHLORIDE ER 10 MEQ PO TBCR: 10.0000 meq | EXTENDED_RELEASE_TABLET | Freq: Every day | ORAL | 0 refills | Status: DC

## 2016-12-09 NOTE — Telephone Encounter (Signed)
Prescription refilled.

## 2016-12-14 ENCOUNTER — Encounter: Payer: Self-pay | Admitting: Physician Assistant

## 2016-12-14 NOTE — Progress Notes (Signed)
Cardiology Office Note   Date:  12/17/2016   ID:  [name], DOB [birth year], MRN [id number]  PCP:  Shon Baton, MD  Cardiologist:  Dr Tamala Julian 07/15/2015  Rosaria Ferries, PA-C   Chief Complaint  Patient presents with  . Shortness of Breath  . Pain    Left leg pain    History of Present Illness: Lai Hendriks is a 83 y.o. female with a history of D-CHF, chronic afib, HTN, CHADS2VASC= 4 (age x 2, female, HTN) on coumadin, fixed obstructive and restrictive lung dz, pt had been on losartan (d/c'd due to low BP)  07/2015 office visit, dyspnea felt multifactorial 07/10/2016 office visit w/ PCP, pt w/ falls, mult closed pelvic fx, L 5th metacarpal fx, DOE. Pt in rehab x 8 weeks, then returned to independent living at [facility].   Dannika Hilgeman presents for cardiology follow up.   She has not been weighing herself but can start. She has not had orthopnea or PND. She has not had LE edema. She lies down during the day to prevent this. She has DOE, this is chronic and has not changed recently. She has not taken her Lasix today because she was coming here. However, she is generally compliant with it.  Since she broke her pelvis, 8 wks in rehab>>2 wks Assisted Living>>her villa. She was doing exercises, but quit. She developed pain going up her inner L leg, From the ankle to the knee. The pain is intermittent, does not wake her. It is positional. It is not related to time of day. It is not related to activity.  She has not had chest pain. She has not had palpitations. She is occasionally aware of her heart rate, but not generally.   Past Medical History:  Diagnosis Date  . Breast cancer ([place])    left   . Chronic anticoagulation   . Chronic atrial fibrillation ([place])   . Hypertension   . Pelvic fracture ([place]) 05/2016    Past Surgical History:  Procedure Laterality Date  . ABDOMINAL HYSTERECTOMY    . BREAST LUMPECTOMY      Current Outpatient Prescriptions  Medication  Sig Dispense Refill  . allopurinol (ZYLOPRIM) 100 MG tablet     . ALPRAZolam (XANAX) 0.5 MG tablet Take 0.5 mg by mouth at bedtime.     Marland Kitchen amLODipine (NORVASC) 2.5 MG tablet Take 2.5 mg by mouth daily.     Marland Kitchen aspirin 81 MG tablet Take 81 mg by mouth daily.    . furosemide (LASIX) 40 MG tablet Take 2 tablets (80 mg total) by mouth daily. 60 tablet 0  . HYDROcodone-acetaminophen (NORCO/VICODIN) 5-325 MG tablet Take 1 tablet by mouth every 4 (four) hours as needed for severe pain. 12 tablet 0  . metoprolol succinate (TOPROL-XL) 100 MG 24 hr tablet Take 100 mg by mouth daily.     . potassium chloride (K-DUR) 10 MEQ tablet Take 1 tablet (10 mEq total) by mouth daily. 30 tablet 0  . warfarin (COUMADIN) 2 MG tablet Take 2 mg by mouth daily.     No current facility-administered medications for this visit.     Allergies:   Codeine    Social History:  The patient  reports that she has quit smoking. Her smoking use included Cigarettes. She has a 1.00 pack-year smoking history. She has never used smokeless tobacco. She reports that she drinks about 1.2 oz of alcohol per week . She reports that she does not use drugs.   Family History:  The patient's family history includes Asthma in her sister and son; Colon cancer in her father; Heart disease in her mother; Ulcers in her father.    ROS:  Please see the history of present illness. All other systems are reviewed and negative.    PHYSICAL EXAM: VS:  BP 120/74   Pulse 74   Ht _0  (1.626 m)   Wt 130 lb 6.4 oz (59.1 kg)   LMP  (LMP Unknown)   BMI 22.38 kg/m  , BMI Body mass index is 22.38 kg/m. GEN: Well nourished, well developed, female in no acute distress  HEENT: normal for age  Neck: minimal JVD, no carotid bruit, no masses Cardiac: Irreg R&R; soft murmur, no rubs, or gallops Respiratory: Decreased breath sounds bases with a few rales bilaterally, normal work of breathing GI: soft, nontender, nondistended, + BS MS: no deformity or atrophy;  no edema; distal pulses are 2+ in all 4 extremities; no significant varicose veins, no tenderness except as related to skin lesions Skin: warm and dry, no rash; lesions on her legs are being followed by her dermatologist. They are very tender to palpation as is the skin around them. Neuro:  Strength and sensation are intact Psych: euthymic mood, full affect   EKG:  EKG is ordered today. The ekg ordered today demonstrates atrial fibrillation, occasional junctional beats, heart rate 74, QRS width 130 ms  ECHO: 10/31/2013 - Left ventricle: The cavity size was normal. Wall thickness was normal. Systolic function was normal. The estimated ejection fraction was in the range of 55% to 60%. - Mitral valve: There was mild regurgitation. - Left atrium: The atrium was mildly dilated. - Right atrium: The atrium was moderately dilated. - Atrial septum: No defect or patent foramen ovale was identified. - Tricuspid valve: There was moderate-severe regurgitation. - Pulmonary arteries: PA peak pressure: 52 mm Hg (S).  Recent Labs: 06/15/2016: ALT 12; Hemoglobin 13.0; Platelets 207 06/18/2016: BUN 18; Creatinine 0.7; Potassium 3.9; Sodium 137    Lipid Panel No results found for: CHOL, TRIG, HDL, CHOLHDL, VLDL, LDLCALC, LDLDIRECT   Wt Readings from Last 3 Encounters:  12/17/16 130 lb 6.4 oz (59.1 kg)  07/10/16 142 lb 9.6 oz (64.7 kg)  06/16/16 134 lb (60.8 kg)     Other studies Reviewed: Additional studies/ records that were reviewed today include: Office notes, hospital records and testing.  ASSESSMENT AND PLAN:  1.  Chronic diastolic CHF: Her weight is actually down 12 pounds from previous. When she focuses on her breathing, it turns out that her symptoms really have not changed much. She has mild volume overloaded by exam, but has not taken her Lasix today. She is encouraged to start daily weights. For weight is trending up, she is to call us.  2. Dyspnea on exertion: I feel there is a  component of deconditioning. She had significant problems after her fall and pelvic fracture. She worked for a while with rehabilitation, then was to do exercises in her home. She did them for a while, but began having pain in her leg and stopped exercising. Additionally, she is unable to drive and that makes it harder for her as well. The gym at [facility] is too far for her to walk. She is encouraged to begin exercising in her apartment and then go back to the gym as she is able.  3. Atrial fibrillation: I do not find any symptoms attributable to the atrial fibrillation. She is rarely aware of her heart rate. Continue Toprol-XL  and her milligrams daily for rate control.  4. Chronic anticoagulation: Her Coumadin is followed carefully and she is having no bleeding issues. Continue Coumadin checks as directed.  5. Hypertension: Her blood pressures well controlled on current medications.  6. Leg pain: Her Coumadin is therapeutic so no recent think there is a DVT. She does not have any varicosities, no significant abnormalities noted. No musculoskeletal issues.  7. Daytime somnolence: I advised that it is possible this and next dose of 0.5 mg is a little strong for her. It is okay to try a half tablet and see if that is helpful to her.  Current medicines are reviewed at length with the patient today.  The patient does not have concerns regarding medicines.  The following changes have been made:  Okay to take  next tablet at bedtime instead of a whole one if this helps her. Okay to take mustard for leg cramps if this helps.  Labs/ tests ordered today include:   Orders Placed This Encounter  Procedures  . Basic metabolic panel  . EKG 12-Lead     Disposition:   FU with Dr Tamala Julian or an M.D. in the [place] building because that is more convenient for the patient and her daughter (DeeDee)  Signed, Lenoard Aden  12/17/2016 3:57 PM    [place] Phone: [phone]; Fax: [phone]  This note was written with the assistance of speech recognition software. Please excuse any transcriptional errors.

## 2016-12-15 DIAGNOSIS — C44329 Squamous cell carcinoma of skin of other parts of face: Secondary | ICD-10-CM | POA: Diagnosis not present

## 2016-12-15 DIAGNOSIS — L814 Other melanin hyperpigmentation: Secondary | ICD-10-CM | POA: Diagnosis not present

## 2016-12-17 ENCOUNTER — Encounter: Payer: Self-pay | Admitting: Physician Assistant

## 2016-12-17 ENCOUNTER — Ambulatory Visit (INDEPENDENT_AMBULATORY_CARE_PROVIDER_SITE_OTHER): Payer: Medicare Other | Admitting: Physician Assistant

## 2016-12-17 VITALS — BP 120/74 | HR 74 | Ht 64.0 in | Wt 130.4 lb

## 2016-12-17 DIAGNOSIS — I482 Chronic atrial fibrillation, unspecified: Secondary | ICD-10-CM

## 2016-12-17 DIAGNOSIS — I10 Essential (primary) hypertension: Secondary | ICD-10-CM

## 2016-12-17 DIAGNOSIS — R0609 Other forms of dyspnea: Secondary | ICD-10-CM

## 2016-12-17 DIAGNOSIS — I5032 Chronic diastolic (congestive) heart failure: Secondary | ICD-10-CM

## 2016-12-17 NOTE — Patient Instructions (Addendum)
Medication Instructions:  MAKE SURE YOU TAKE YOUR LASIX WHEN YOU GET HOME If you need a refill on your cardiac medications before your next appointment, please call your pharmacy.  Labwork: BMET TODAY HERE IN OUR OFFICE AT [facility]  Special Instructions: CONTINUE DAILY WEIGHTS  OK TO TAKE OTC MUSTARD AND TYLENOL FOR LEG PAIN  OK TO TAKE 1/2 XANAX TO LESSEN MORNING SEDATION  TALK TO DERMATOLOGIST MD TO MAKE SURE OK TO WEAR COMPRESSION STOCKINGS ON LEFT LEG THEN WEAR THEM DAILY  INCREASE ACTIVITY AS TOLERATED  Follow-Up: Your physician wants you to follow-up in: 3 MONTHS WITH DR [id number].   Thank you for choosing [facility]!!

## 2016-12-18 LAB — BASIC METABOLIC PANEL
BUN/Creatinine Ratio: 24 (ref 12–28)
BUN: 24 mg/dL (ref 10–36)
CALCIUM: 9.9 mg/dL (ref 8.7–10.3)
CHLORIDE: 95 mmol/L — AB (ref 96–106)
CO2: 27 mmol/L (ref 20–29)
Creatinine, Ser: 0.98 mg/dL (ref 0.57–1.00)
GFR calc Af Amer: 59 mL/min/{1.73_m2} — ABNORMAL LOW (ref >59)
GFR, EST NON AFRICAN AMERICAN: 51 mL/min/{1.73_m2} — AB (ref >59)
Glucose: 96 mg/dL (ref 65–99)
POTASSIUM: 3.7 mmol/L (ref 3.5–5.2)
Sodium: 140 mmol/L (ref 134–144)

## 2017-01-04 DIAGNOSIS — H5213 Myopia, bilateral: Secondary | ICD-10-CM | POA: Diagnosis not present

## 2017-01-04 DIAGNOSIS — H0012 Chalazion right lower eyelid: Secondary | ICD-10-CM | POA: Diagnosis not present

## 2017-01-04 DIAGNOSIS — Z961 Presence of intraocular lens: Secondary | ICD-10-CM | POA: Diagnosis not present

## 2017-01-04 DIAGNOSIS — H11062 Recurrent pterygium of left eye: Secondary | ICD-10-CM | POA: Diagnosis not present

## 2017-01-06 ENCOUNTER — Other Ambulatory Visit: Payer: Self-pay | Admitting: Interventional Cardiology

## 2017-01-06 DIAGNOSIS — C44329 Squamous cell carcinoma of skin of other parts of face: Secondary | ICD-10-CM | POA: Diagnosis not present

## 2017-01-06 DIAGNOSIS — Z85828 Personal history of other malignant neoplasm of skin: Secondary | ICD-10-CM | POA: Diagnosis not present

## 2017-01-11 ENCOUNTER — Other Ambulatory Visit: Payer: Self-pay

## 2017-01-11 MED ORDER — POTASSIUM CHLORIDE ER 10 MEQ PO TBCR: 10.0000 meq | EXTENDED_RELEASE_TABLET | Freq: Every day | ORAL | 5 refills | Status: DC

## 2017-01-11 MED ORDER — POTASSIUM CHLORIDE ER 10 MEQ PO TBCR: 10.0000 meq | EXTENDED_RELEASE_TABLET | Freq: Every day | ORAL | 0 refills | Status: DC

## 2017-01-12 DIAGNOSIS — I480 Paroxysmal atrial fibrillation: Secondary | ICD-10-CM | POA: Diagnosis not present

## 2017-01-12 DIAGNOSIS — Z7901 Long term (current) use of anticoagulants: Secondary | ICD-10-CM | POA: Diagnosis not present

## 2017-01-13 DIAGNOSIS — L0889 Other specified local infections of the skin and subcutaneous tissue: Secondary | ICD-10-CM | POA: Diagnosis not present

## 2017-02-09 ENCOUNTER — Other Ambulatory Visit: Payer: Self-pay | Admitting: Cardiovascular Disease

## 2017-02-11 ENCOUNTER — Other Ambulatory Visit: Payer: Self-pay | Admitting: Cardiovascular Disease

## 2017-02-11 DIAGNOSIS — Z79899 Other long term (current) drug therapy: Secondary | ICD-10-CM | POA: Diagnosis not present

## 2017-02-11 DIAGNOSIS — R7309 Other abnormal glucose: Secondary | ICD-10-CM | POA: Diagnosis not present

## 2017-02-11 DIAGNOSIS — I480 Paroxysmal atrial fibrillation: Secondary | ICD-10-CM | POA: Diagnosis not present

## 2017-02-11 DIAGNOSIS — D539 Nutritional anemia, unspecified: Secondary | ICD-10-CM | POA: Diagnosis not present

## 2017-02-11 DIAGNOSIS — D538 Other specified nutritional anemias: Secondary | ICD-10-CM | POA: Diagnosis not present

## 2017-02-11 DIAGNOSIS — Z7901 Long term (current) use of anticoagulants: Secondary | ICD-10-CM | POA: Diagnosis not present

## 2017-02-11 DIAGNOSIS — M859 Disorder of bone density and structure, unspecified: Secondary | ICD-10-CM | POA: Diagnosis not present

## 2017-02-11 NOTE — Telephone Encounter (Signed)
REFILL

## 2017-02-18 DIAGNOSIS — R7309 Other abnormal glucose: Secondary | ICD-10-CM | POA: Diagnosis not present

## 2017-02-18 DIAGNOSIS — I480 Paroxysmal atrial fibrillation: Secondary | ICD-10-CM | POA: Diagnosis not present

## 2017-02-18 DIAGNOSIS — J449 Chronic obstructive pulmonary disease, unspecified: Secondary | ICD-10-CM | POA: Diagnosis not present

## 2017-02-18 DIAGNOSIS — I2510 Atherosclerotic heart disease of native coronary artery without angina pectoris: Secondary | ICD-10-CM | POA: Diagnosis not present

## 2017-02-18 DIAGNOSIS — I2721 Secondary pulmonary arterial hypertension: Secondary | ICD-10-CM | POA: Diagnosis not present

## 2017-02-18 DIAGNOSIS — D692 Other nonthrombocytopenic purpura: Secondary | ICD-10-CM | POA: Diagnosis not present

## 2017-02-18 DIAGNOSIS — N183 Chronic kidney disease, stage 3 (moderate): Secondary | ICD-10-CM | POA: Diagnosis not present

## 2017-02-18 DIAGNOSIS — Z6822 Body mass index (BMI) 22.0-22.9, adult: Secondary | ICD-10-CM | POA: Diagnosis not present

## 2017-02-18 DIAGNOSIS — I130 Hypertensive heart and chronic kidney disease with heart failure and stage 1 through stage 4 chronic kidney disease, or unspecified chronic kidney disease: Secondary | ICD-10-CM | POA: Diagnosis not present

## 2017-02-18 DIAGNOSIS — Z0000 Encounter for general adult medical examination without abnormal findings: Secondary | ICD-10-CM | POA: Diagnosis not present

## 2017-02-18 DIAGNOSIS — Z1389 Encounter for screening for other disorder: Secondary | ICD-10-CM | POA: Diagnosis not present

## 2017-02-18 DIAGNOSIS — I5032 Chronic diastolic (congestive) heart failure: Secondary | ICD-10-CM | POA: Diagnosis not present

## 2017-03-01 DIAGNOSIS — C50919 Malignant neoplasm of unspecified site of unspecified female breast: Secondary | ICD-10-CM | POA: Insufficient documentation

## 2017-03-17 DIAGNOSIS — Z7901 Long term (current) use of anticoagulants: Secondary | ICD-10-CM | POA: Diagnosis not present

## 2017-03-17 DIAGNOSIS — I480 Paroxysmal atrial fibrillation: Secondary | ICD-10-CM | POA: Diagnosis not present

## 2017-03-23 ENCOUNTER — Other Ambulatory Visit: Payer: Self-pay

## 2017-03-23 DIAGNOSIS — H11002 Unspecified pterygium of left eye: Secondary | ICD-10-CM | POA: Diagnosis not present

## 2017-03-23 DIAGNOSIS — H11062 Recurrent pterygium of left eye: Secondary | ICD-10-CM | POA: Diagnosis not present

## 2017-03-25 ENCOUNTER — Telehealth: Payer: Self-pay | Admitting: Cardiovascular Disease

## 2017-03-25 ENCOUNTER — Encounter: Payer: Self-pay | Admitting: Cardiovascular Disease

## 2017-03-25 ENCOUNTER — Ambulatory Visit (INDEPENDENT_AMBULATORY_CARE_PROVIDER_SITE_OTHER): Payer: Medicare Other | Admitting: Cardiovascular Disease

## 2017-03-25 VITALS — BP 94/60 | HR 53 | Ht 64.0 in | Wt 130.0 lb

## 2017-03-25 DIAGNOSIS — Z7901 Long term (current) use of anticoagulants: Secondary | ICD-10-CM

## 2017-03-25 DIAGNOSIS — J449 Chronic obstructive pulmonary disease, unspecified: Secondary | ICD-10-CM

## 2017-03-25 DIAGNOSIS — I482 Chronic atrial fibrillation, unspecified: Secondary | ICD-10-CM

## 2017-03-25 DIAGNOSIS — I5032 Chronic diastolic (congestive) heart failure: Secondary | ICD-10-CM | POA: Diagnosis not present

## 2017-03-25 DIAGNOSIS — I2721 Secondary pulmonary arterial hypertension: Secondary | ICD-10-CM

## 2017-03-25 HISTORY — DX: Secondary pulmonary arterial hypertension: I27.21

## 2017-03-25 NOTE — Progress Notes (Addendum)
Cardiology Office Note:    Date:  03/25/2017   ID:  [name], DOB 1925/01/14, MRN [id number]  PCP:  Shon Baton, MD  Cardiologist:  Sanda Klein, MD   Referring MD: Shon Baton, MD   Chief Complaint  Patient presents with  . Atrial Fibrillation    pt c/o SOB on minimal exertion; no other Sx.    History of Present Illness:    [name] is a 84 y.o. female with a hx of long-standing persistent atrial fibrillation, severe chronic lung disease, moderate pulmonary hypertension, mild systemic hypertension, reported diagnosis of chronic diastolic heart failure, presenting with complaints of exertional dyspnea and fatigue. She has a history of left breast lumpectomy and radiation therapy in the remote past. She is accompanied by her daughter, [name], who works in our office.  [name] is severely limited by dyspnea, but is also quite sedentary. Her favorite activity is reading and she is part of a book club at [employer], where she resides. She becomes short of breath with minimal activity, NYHA functional class III, and occasionally she complains of shortness of breath at rest.   She denies angina at rest with activity and is not aware of palpitations. She is occasionally dizzy when she stands up but has not had syncope. She denies leg edema and intermittent claudication. She has not had focal neurological events or any bleeding problems. She is on chronic anticoagulation with warfarin. She is also taking aspirin, but I'm not sure of the indication.  Pulmonary function test performed in 2016 showed severe obstructive lung disease with an FEV1 of roughly 750 mL,  45% of predicted, only minimal improvement after bronchodilators. She has a less prominent component of restrictive lung disease with FVC roughly 70% of predicted. Her echocardiogram performed in 2015 showed enlargement of the right heart chambers, moderate to severe right cuspid regurgitation, moderate pulmonary  hypertension estimate a systolic PA pressure of 52 mmHg, normal left ventricular systolic function. ACT in 2016 did not show pulmonary embolism or significant parenchymal infiltrates.  Although her records list diastolic dysfunction, this is not really supported by echo findings (hard to interpret in the setting of atrial fibrillation) and notes report that she did not have any improvement with diuretic therapy and had significant electrolyte imbalances when aggressive diuresis was attempted.  She has a very limited history of personal smoking (one pack year). She did have some secondhand exposure from her husband, although he never smoked in the house once the children were born. She had limited improvement with pulmonary rehabilitation, but does not want to continue in that process. She had no improvement with inhaled albuterol. Oxygen supplementation has been considered, but until today her oxygen saturation level was above the required limit.  Oxygen saturation by pulse oximeter at rest, on room air, was 88% today.  Past Medical History:  Diagnosis Date  . Breast cancer ([place])    left   . Chronic anticoagulation   . Chronic atrial fibrillation ([place])   . Hypertension   . Pelvic fracture ([place]) 05/2016    Past Surgical History:  Procedure Laterality Date  . ABDOMINAL HYSTERECTOMY    . BREAST LUMPECTOMY      Current Medications: Current Meds  Medication Sig  . allopurinol (ZYLOPRIM) 100 MG tablet   . ALPRAZolam (XANAX) 0.5 MG tablet Take 0.5 mg by mouth at bedtime.   Marland Kitchen amLODipine (NORVASC) 2.5 MG tablet Take 2.5 mg by mouth daily.   Marland Kitchen aspirin 81 MG tablet Take 81 mg  by mouth daily.  . furosemide (LASIX) 40 MG tablet TAKE 2 TABLETS DAILY.  . metoprolol succinate (TOPROL-XL) 100 MG 24 hr tablet Take 100 mg by mouth daily.   . potassium chloride (K-DUR) 10 MEQ tablet TAKE 1 TABLET EACH DAY.  Marland Kitchen warfarin (COUMADIN) 2 MG tablet Take 2 mg by mouth daily.     Allergies:   Codeine    Social History   Socioeconomic History  . Marital status: Married    Spouse name: None  . Number of children: None  . Years of education: None  . Highest education level: None  Social Needs  . Financial resource strain: None  . Food insecurity - worry: None  . Food insecurity - inability: None  . Transportation needs - medical: None  . Transportation needs - non-medical: None  Occupational History  . None  Tobacco Use  . Smoking status: Former Smoker    Packs/day: 0.20    Years: 5.00    Pack years: 1.00    Types: Cigarettes  . Smokeless tobacco: Never Used  . Tobacco comment: pt has not smoked in 50 years  Substance and Sexual Activity  . Alcohol use: Yes    Alcohol/week: 1.2 oz    Types: 2 Glasses of wine per week  . Drug use: No  . Sexual activity: None  Other Topics Concern  . None  Social History Narrative  . None     Family History: The patient's family history includes Asthma in her sister and son; Colon cancer in her father; Heart disease in her mother; Ulcers in her father.  ROS:   Please see the history of present illness.    All other systems reviewed and are negative.  EKGs/Labs/Other Studies Reviewed:    The following studies were reviewed today: Pulmonary function tests 2016, echo 2015, notes from Dr. Tamala Julian and Dr. Lamonte Sakai  EKG:  EKG is ordered today.  The ekg ordered today demonstrates atrial fibrillation with controlled ventricular response  Recent Labs: 06/15/2016: ALT 12; Hemoglobin 13.0; Platelets 207 12/17/2016: BUN 24; Creatinine, Ser 0.98; Potassium 3.7; Sodium 140  Recent Lipid Panel No results found for: CHOL, TRIG, HDL, CHOLHDL, VLDL, LDLCALC, LDLDIRECT  Physical Exam:    VS:  BP 94/60 (BP Location: Right Arm, Patient Position: Sitting, Cuff Size: Normal)   Pulse (!) 53   Ht 5' 4" (1.626 m)   Wt 130 lb (59 kg)   LMP  (LMP Unknown)   SpO2 (!) 88%   BMI 22.31 kg/m     Wt Readings from Last 3 Encounters:  03/25/17 130 lb (59  kg)  12/17/16 130 lb 6.4 oz (59.1 kg)  07/10/16 142 lb 9.6 oz (64.7 kg)     GEN: Appears elderly, thin and rather frail, well developed in no acute distress HEENT: Normal NECK: 5-6 cm JVD; No carotid bruits LYMPHATICS: No lymphadenopathy CARDIAC: irregular, no murmurs, rubs, gallops RESPIRATORY:  Distant breath sounds but otherwise clear to auscultation without rales, wheezing or rhonchi  ABDOMEN: Soft, non-tender, non-distended MUSCULOSKELETAL:  No edema; No deformity  SKIN: Warm and dry NEUROLOGIC:  Alert and oriented x 3 PSYCHIATRIC:  Normal affect   ASSESSMENT:    1. Pulmonary hypertension, unspecified ([place])   2. Chronic obstructive pulmonary disease, unspecified COPD type ([place])    PLAN:    In order of problems listed above:  1. PAH: I don't think there is any compelling evidence that she has left ventricular diastolic dysfunction. In fact, her tissue Doppler velocities on  the echocardiogram from 2015 are quite normal especially for a nonagenarian. I believe the tissue Doppler velocities are high enough to make cardiac amyloidosis or radiation-induced restrictive lung disease very unlikely. Cannot exclude a component of constrictive pericarditis, but this would be very hard to diagnose in a patient with atrial fibrillation and she is by no means a candidate for surgery. Her biatrial enlargement is likely related to long-standing atrial fibrillation. I suspect that her pulmonary hypertension is primarily driven by chronic lung disease. Consequently, the most important treatment with the oxygen supplementation. Repeat an echo to see if there has been worsening of pulmonary hypertension. 2. CHF: I don't think she has diastolic left heart failure, but she does have evidence of right heart failure both clinically and by echo. Volume status appears okay right now. 3. COPD: Oxygen saturation by pulse oximeter at rest, on room air, was 88% today. She has evidence of both restrictive and or  significantly obstructive lung disease. Her personal history of smoking is minimal. Consider component of restrictive disease from previous radiation therapy to the breast area. She did not have any improvement with inhaled bronchodilators and is no longer using these. Oxygen may be beneficial both for symptom relief and improvement in pathophysiology. She is very reluctant to consider oxygen, but based on the very low oxygen saturation today at 88% I recommended that she at least give this a try. 4. AFib: Rate control appears to be excessive and her blood pressure is rather low, albeit without a lot of symptoms. Reduce metoprolol to half the current dose. May also consider switching to a more selective beta blocker such as bisoprolol or transitioning to a centrally acting calcium channel blocker such as verapamil or diltiazem. If her EF is still normal, a calcium channel blocker may be a better choice altogether. 5. Warfarin: Well-tolerated without bleeding complications. No history of stroke or other embolic events. CHADSvasc 5 (age 33, gender, HTN, CHF).   Medication Adjustments/Labs and Tests Ordered: Current medicines are reviewed at length with the patient today.  Concerns regarding medicines are outlined above.  Orders Placed This Encounter  Procedures  . For home use only DME oxygen  . ECHOCARDIOGRAM COMPLETE   No orders of the defined types were placed in this encounter.   Signed, Sanda Klein, MD  03/25/2017 12:32 PM    [place]

## 2017-03-25 NOTE — Patient Instructions (Signed)
Dr Sallyanne Kuster recommends that you continue on your current medications as directed. Please refer to the Current Medication list given to you today.  Your physician has requested that you have an echocardiogram. Echocardiography is a painless test that uses sound waves to create images of your heart. It provides your doctor with information about the size and shape of your heart and how well your heart's chambers and valves are working. This procedure takes approximately one hour. There are no restrictions for this procedure.  Dr Sallyanne Kuster recommends that you schedule a follow-up appointment in 3 months.  If you need a refill on your cardiac medications before your next appointment, please call your pharmacy.   Dr Sallyanne Kuster recommends that you have continuous oxygen. I have placed an order and will send your information to [facility].

## 2017-03-25 NOTE — Telephone Encounter (Signed)
Per Dr. Sallyanne Kuster, pt needs to stop ASA since she is on warfarin. Called pt daughter, ok per DPR, to make her aware. She stated she will tell her mom. I have removed ASA from med list.

## 2017-03-31 DIAGNOSIS — C44722 Squamous cell carcinoma of skin of right lower limb, including hip: Secondary | ICD-10-CM | POA: Diagnosis not present

## 2017-03-31 DIAGNOSIS — L821 Other seborrheic keratosis: Secondary | ICD-10-CM | POA: Diagnosis not present

## 2017-03-31 DIAGNOSIS — C44729 Squamous cell carcinoma of skin of left lower limb, including hip: Secondary | ICD-10-CM | POA: Diagnosis not present

## 2017-03-31 DIAGNOSIS — D485 Neoplasm of uncertain behavior of skin: Secondary | ICD-10-CM | POA: Diagnosis not present

## 2017-03-31 DIAGNOSIS — Z85828 Personal history of other malignant neoplasm of skin: Secondary | ICD-10-CM | POA: Diagnosis not present

## 2017-03-31 DIAGNOSIS — D0461 Carcinoma in situ of skin of right upper limb, including shoulder: Secondary | ICD-10-CM | POA: Diagnosis not present

## 2017-04-21 DIAGNOSIS — I480 Paroxysmal atrial fibrillation: Secondary | ICD-10-CM | POA: Diagnosis not present

## 2017-04-21 DIAGNOSIS — Z7901 Long term (current) use of anticoagulants: Secondary | ICD-10-CM | POA: Diagnosis not present

## 2017-05-06 DIAGNOSIS — R0609 Other forms of dyspnea: Secondary | ICD-10-CM | POA: Diagnosis not present

## 2017-05-06 DIAGNOSIS — Z6822 Body mass index (BMI) 22.0-22.9, adult: Secondary | ICD-10-CM | POA: Diagnosis not present

## 2017-05-06 DIAGNOSIS — L03115 Cellulitis of right lower limb: Secondary | ICD-10-CM | POA: Diagnosis not present

## 2017-05-06 DIAGNOSIS — I878 Other specified disorders of veins: Secondary | ICD-10-CM | POA: Diagnosis not present

## 2017-05-19 DIAGNOSIS — I480 Paroxysmal atrial fibrillation: Secondary | ICD-10-CM | POA: Diagnosis not present

## 2017-05-19 DIAGNOSIS — Z7901 Long term (current) use of anticoagulants: Secondary | ICD-10-CM | POA: Diagnosis not present

## 2017-06-13 ENCOUNTER — Other Ambulatory Visit: Payer: Self-pay | Admitting: Cardiovascular Disease

## 2017-06-15 ENCOUNTER — Ambulatory Visit (HOSPITAL_COMMUNITY): Payer: Medicare Other | Attending: Cardiovascular Disease

## 2017-06-15 ENCOUNTER — Other Ambulatory Visit: Payer: Self-pay

## 2017-06-15 DIAGNOSIS — I340 Nonrheumatic mitral (valve) insufficiency: Secondary | ICD-10-CM | POA: Diagnosis not present

## 2017-06-15 DIAGNOSIS — Z853 Personal history of malignant neoplasm of breast: Secondary | ICD-10-CM | POA: Insufficient documentation

## 2017-06-15 DIAGNOSIS — I509 Heart failure, unspecified: Secondary | ICD-10-CM | POA: Diagnosis not present

## 2017-06-15 DIAGNOSIS — J449 Chronic obstructive pulmonary disease, unspecified: Secondary | ICD-10-CM | POA: Diagnosis not present

## 2017-06-15 DIAGNOSIS — Z87891 Personal history of nicotine dependence: Secondary | ICD-10-CM | POA: Diagnosis not present

## 2017-06-15 DIAGNOSIS — I2721 Secondary pulmonary arterial hypertension: Secondary | ICD-10-CM | POA: Diagnosis not present

## 2017-06-15 DIAGNOSIS — I110 Hypertensive heart disease with heart failure: Secondary | ICD-10-CM | POA: Insufficient documentation

## 2017-06-16 DIAGNOSIS — Z7901 Long term (current) use of anticoagulants: Secondary | ICD-10-CM | POA: Diagnosis not present

## 2017-06-16 DIAGNOSIS — I480 Paroxysmal atrial fibrillation: Secondary | ICD-10-CM | POA: Diagnosis not present

## 2017-06-29 DIAGNOSIS — D485 Neoplasm of uncertain behavior of skin: Secondary | ICD-10-CM | POA: Diagnosis not present

## 2017-06-29 DIAGNOSIS — C44629 Squamous cell carcinoma of skin of left upper limb, including shoulder: Secondary | ICD-10-CM | POA: Diagnosis not present

## 2017-06-29 DIAGNOSIS — L570 Actinic keratosis: Secondary | ICD-10-CM | POA: Diagnosis not present

## 2017-06-29 DIAGNOSIS — Z85828 Personal history of other malignant neoplasm of skin: Secondary | ICD-10-CM | POA: Diagnosis not present

## 2017-07-05 ENCOUNTER — Other Ambulatory Visit: Payer: Self-pay | Admitting: Physician Assistant

## 2017-07-05 NOTE — Telephone Encounter (Signed)
REFILL 

## 2017-07-12 ENCOUNTER — Encounter: Payer: Self-pay | Admitting: Cardiovascular Disease

## 2017-07-12 ENCOUNTER — Ambulatory Visit (INDEPENDENT_AMBULATORY_CARE_PROVIDER_SITE_OTHER): Payer: Medicare Other | Admitting: Cardiovascular Disease

## 2017-07-12 VITALS — BP 118/76 | HR 78 | Ht 64.0 in | Wt 123.0 lb

## 2017-07-12 DIAGNOSIS — J449 Chronic obstructive pulmonary disease, unspecified: Secondary | ICD-10-CM

## 2017-07-12 DIAGNOSIS — I482 Chronic atrial fibrillation, unspecified: Secondary | ICD-10-CM

## 2017-07-12 DIAGNOSIS — I2721 Secondary pulmonary arterial hypertension: Secondary | ICD-10-CM | POA: Diagnosis not present

## 2017-07-12 DIAGNOSIS — Z7901 Long term (current) use of anticoagulants: Secondary | ICD-10-CM | POA: Diagnosis not present

## 2017-07-12 DIAGNOSIS — I10 Essential (primary) hypertension: Secondary | ICD-10-CM

## 2017-07-12 DIAGNOSIS — I50812 Chronic right heart failure: Secondary | ICD-10-CM | POA: Diagnosis not present

## 2017-07-12 NOTE — Progress Notes (Signed)
Cardiology Office Note:    Date:  07/12/2017   ID:  [name], DOB 06-14-1924, MRN [id number]  PCP:  Shon Baton, MD  Cardiologist:  Sanda Klein, MD   Referring MD: Shon Baton, MD   No chief complaint on file.   History of Present Illness:    [name] is a 84 y.o. female with a hx of long-standing persistent atrial fibrillation, severe chronic lung disease, moderate pulmonary hypertension, mild systemic hypertension, reported diagnosis of chronic diastolic heart failure, presenting with complaints of exertional dyspnea and fatigue. She has a history of left breast lumpectomy and radiation therapy in the remote past. As always, she is accompanied by her daughter, [name], who works in our office.  At her last office appointment we documented a resting oxygen saturation of 88% and I made arrangements for home oxygen.  She clearly does not want oxygen in any form.  Pretty much turned away the oxygen provider from her front door.  Remains NYHA class III.  Has mild pedal edema and ankle swelling, which has led to some delay in the healing of a left calf skin biopsy.  Her favorite activity is reading and she is part of a book club at [employer], where she resides.  Her appetite is poor and she is very lean with a BMI barely at 21.  Eats yogurt for breakfast, a couple of peanut butter crackers at lunch and then has a large dinner.  The patient specifically denies any chest pain at rest or exertion, orthopnea, paroxysmal nocturnal dyspnea, syncope, palpitations, focal neurological deficits, intermittent claudication, unexplained weight gain, cough, hemoptysis or worsening wheezing.  She always uses pursed lip breathing, even at rest  Pulmonary function test performed in 2016 showed severe obstructive lung disease with an FEV1 of roughly 750 mL,  45% of predicted, only minimal improvement after bronchodilators. She has a less prominent component of restrictive lung disease with FVC  roughly 70% of predicted. Her echocardiogram performed in 2015 showed enlargement of the right heart chambers, moderate to severe right cuspid regurgitation, moderate pulmonary hypertension estimate a systolic PA pressure of 52 mmHg, normal left ventricular systolic function. ACT in 2016 did not show pulmonary embolism or significant parenchymal infiltrates.  Although her records list diastolic dysfunction, this is not really supported by echo findings (hard to interpret in the setting of atrial fibrillation) and notes report that she did not have any improvement with diuretic therapy and had significant electrolyte imbalances when aggressive diuresis was attempted.  She has a very limited history of personal smoking (one pack year). She did have some secondhand exposure from her husband, although he never smoked in the house once the children were born. She had limited improvement with pulmonary rehabilitation, but does not want to continue in that process. She had no improvement with inhaled albuterol. Oxygen supplementation has been recommended, but she has declined it.   Past Medical History:  Diagnosis Date  . Breast cancer ([place])    left   . Chronic anticoagulation   . Chronic atrial fibrillation ([place])   . Hypertension   . Pelvic fracture ([place]) 05/2016    Past Surgical History:  Procedure Laterality Date  . ABDOMINAL HYSTERECTOMY    . BREAST LUMPECTOMY      Current Medications: Current Meds  Medication Sig  . allopurinol (ZYLOPRIM) 100 MG tablet   . ALPRAZolam (XANAX) 0.5 MG tablet Take 0.5 mg by mouth at bedtime.   . furosemide (LASIX) 40 MG tablet TAKE 2 TABLETS BY  MOUTH ONCE DAILY.  . metoprolol succinate (TOPROL-XL) 100 MG 24 hr tablet Take 50 mg by mouth daily.   . potassium chloride (K-DUR) 10 MEQ tablet TAKE 1 TABLET EACH DAY.  Marland Kitchen warfarin (COUMADIN) 2 MG tablet Take 2 mg by mouth daily.  . [DISCONTINUED] amLODipine (NORVASC) 2.5 MG tablet Take 2.5 mg by mouth daily.       Allergies:   Codeine   Social History   Socioeconomic History  . Marital status: Married    Spouse name: Not on file  . Number of children: Not on file  . Years of education: Not on file  . Highest education level: Not on file  Occupational History  . Not on file  Social Needs  . Financial resource strain: Not on file  . Food insecurity:    Worry: Not on file    Inability: Not on file  . Transportation needs:    Medical: Not on file    Non-medical: Not on file  Tobacco Use  . Smoking status: Former Smoker    Packs/day: 0.20    Years: 5.00    Pack years: 1.00    Types: Cigarettes  . Smokeless tobacco: Never Used  . Tobacco comment: pt has not smoked in 50 years  Substance and Sexual Activity  . Alcohol use: Yes    Alcohol/week: 1.2 oz    Types: 2 Glasses of wine per week  . Drug use: No  . Sexual activity: Not on file  Lifestyle  . Physical activity:    Days per week: Not on file    Minutes per session: Not on file  . Stress: Not on file  Relationships  . Social connections:    Talks on phone: Not on file    Gets together: Not on file    Attends religious service: Not on file    Active member of club or organization: Not on file    Attends meetings of clubs or organizations: Not on file    Relationship status: Not on file  Other Topics Concern  . Not on file  Social History Narrative  . Not on file     Family History: The patient's family history includes Asthma in her sister and son; Colon cancer in her father; Heart disease in her mother; Ulcers in her father.  ROS:   Please see the history of present illness.    All other systems reviewed and are negative.  EKGs/Labs/Other Studies Reviewed:     EKG:  EKG is ordered today.  The ekg ordered today demonstrates atrial fibrillation, left bundle branch block, left axis deviation  Recent Labs: 12/17/2016: BUN 24; Creatinine, Ser 0.98; Potassium 3.7; Sodium 140  Recent Lipid Panel No results found  for: CHOL, TRIG, HDL, CHOLHDL, VLDL, LDLCALC, LDLDIRECT  Physical Exam:    VS:  BP 118/76   Pulse 78   Ht _0  (1.626 m)   Wt 123 lb (55.8 kg)   LMP  (LMP Unknown)   BMI 21.11 kg/m     Wt Readings from Last 3 Encounters:  07/12/17 123 lb (55.8 kg)  03/25/17 130 lb (59 kg)  12/17/16 130 lb 6.4 oz (59.1 kg)      General: Alert, oriented x3, no distress, very slender, borderline underweight, appears frail Head: no evidence of trauma, PERRL, EOMI, no exophtalmos or lid lag, no myxedema, no xanthelasma; normal ears, nose and oropharynx Neck: normal jugular venous pulsations and no hepatojugular reflux; brisk carotid pulses without delay and no  carotid bruits Chest:  with severely diminished breath sounds throughout but no wheezing or rales Cardiovascular: normal position and quality of the apical impulse, irregular rhythm, normal first and toxically split second heart sounds, no murmurs, rubs or gallops Abdomen: no tenderness or distention, no masses by palpation, no abnormal pulsatility or arterial bruits, normal bowel sounds, no hepatosplenomegaly Extremities: no clubbing, cyanosis or edema; 2+ radial, ulnar and brachial pulses bilaterally; 2+ right femoral, posterior tibial and dorsalis pedis pulses; 2+ left femoral, posterior tibial and dorsalis pedis pulses; no subclavian or femoral bruits Neurological: grossly nonfocal Psych: Normal mood and affect   ASSESSMENT:    1. PAH (pulmonary artery hypertension) ([place])   2. Chronic right-sided heart failure ([place])   3. Essential hypertension   4. Chronic obstructive pulmonary disease, unspecified COPD type ([place])   5. Chronic atrial fibrillation ([facility])   6. Long term (current) use of anticoagulants    PLAN:    In order of problems listed above:  1. PAH: This is almost certainly due to chronic lung disease and I recommended home O2, but she has declined. 2. CHF: There is minimal evidence of ankle swelling, consistent with right heart  failure or amlodipine side effect.  Clinically euvolemic or close to that.  As described my previous note, I do not think she has diastolic left heart failure. She clearly has preserved LVEF. 3. HTN: Excellent blood pressure, I think we can try to discontinue her amlodipine altogether, continue metoprolol and furosemide 4. COPD: She has evidence of both restrictive and obstructive lung disease previous PFTs. Her personal history of smoking is minimal, but she did have substantial secondhand exposure. Consider component of restrictive disease from previous radiation therapy to the breast area.  Inhalers were prescribed but she stopped them since they were not beneficial.  I think she qualifies for home O2 and made those arrangements, but she does not want to use it. 5. AFib: We reduced her dose of metoprolol at her previous appointment and she still has excellent rate control.  She is therapeutically anticoagulated. CHADSvasc 5 (age 66, gender, HTN, CHF). 6. Warfarin: Well-tolerated without bleeding complications. No history of stroke or other embolic events.  Patient Instructions  Dr Sallyanne Kuster has recommended making the following medication changes: 1. STOP Amlodipine  Your physician has requested that you regularly monitor your blood pressure at home. Please use the same machine to check your blood pressure daily. Keep a record of your blood pressures using the log sheet provided. In 2-3 weeks, please report your readings back to Dr C. You may use our online patient portal 'MyChart' or you can call the office to speak with a nurse.  Dr Sallyanne Kuster recommends that you schedule a follow-up appointment in 12 months. You will receive a reminder letter in the mail two months in advance. If you don't receive a letter, please call our office to schedule the follow-up appointment.  If you need a refill on your cardiac medications before your next appointment, please call your pharmacy.   Medication  Adjustments/Labs and Tests Ordered: Current medicines are reviewed at length with the patient today.  Concerns regarding medicines are outlined above.  Orders Placed This Encounter  Procedures  . EKG 12-Lead   No orders of the defined types were placed in this encounter.   Signed, Sanda Klein, MD  07/12/2017 6:22 PM    [facility]

## 2017-07-12 NOTE — Patient Instructions (Signed)
Dr Sallyanne Kuster has recommended making the following medication changes: 1. STOP Amlodipine  Your physician has requested that you regularly monitor your blood pressure at home. Please use the same machine to check your blood pressure daily. Keep a record of your blood pressures using the log sheet provided. In 2-3 weeks, please report your readings back to Dr C. You may use our online patient portal 'MyChart' or you can call the office to speak with a nurse.  Dr Sallyanne Kuster recommends that you schedule a follow-up appointment in 12 months. You will receive a reminder letter in the mail two months in advance. If you don't receive a letter, please call our office to schedule the follow-up appointment.  If you need a refill on your cardiac medications before your next appointment, please call your pharmacy.

## 2017-07-19 ENCOUNTER — Other Ambulatory Visit: Payer: Self-pay

## 2017-07-19 MED ORDER — METOPROLOL SUCCINATE ER 50 MG PO TB24: 50.0000 mg | ORAL_TABLET | Freq: Every day | ORAL | 3 refills | Status: DC

## 2017-07-19 MED ORDER — POTASSIUM CHLORIDE ER 10 MEQ PO TBCR: 10.0000 meq | EXTENDED_RELEASE_TABLET | Freq: Every day | ORAL | 3 refills | Status: DC

## 2017-07-19 NOTE — Telephone Encounter (Signed)
Rx(s) sent to pharmacy electronically.

## 2017-07-22 DIAGNOSIS — I480 Paroxysmal atrial fibrillation: Secondary | ICD-10-CM | POA: Diagnosis not present

## 2017-07-22 DIAGNOSIS — Z7901 Long term (current) use of anticoagulants: Secondary | ICD-10-CM | POA: Diagnosis not present

## 2017-08-19 DIAGNOSIS — I480 Paroxysmal atrial fibrillation: Secondary | ICD-10-CM | POA: Diagnosis not present

## 2017-08-19 DIAGNOSIS — Z7901 Long term (current) use of anticoagulants: Secondary | ICD-10-CM | POA: Diagnosis not present

## 2017-09-13 DIAGNOSIS — L0889 Other specified local infections of the skin and subcutaneous tissue: Secondary | ICD-10-CM | POA: Diagnosis not present

## 2017-09-13 DIAGNOSIS — Z85828 Personal history of other malignant neoplasm of skin: Secondary | ICD-10-CM | POA: Diagnosis not present

## 2017-09-13 DIAGNOSIS — L97921 Non-pressure chronic ulcer of unspecified part of left lower leg limited to breakdown of skin: Secondary | ICD-10-CM | POA: Diagnosis not present

## 2017-09-13 DIAGNOSIS — L089 Local infection of the skin and subcutaneous tissue, unspecified: Secondary | ICD-10-CM | POA: Diagnosis not present

## 2017-09-13 DIAGNOSIS — D0461 Carcinoma in situ of skin of right upper limb, including shoulder: Secondary | ICD-10-CM | POA: Diagnosis not present

## 2017-09-16 DIAGNOSIS — Z7901 Long term (current) use of anticoagulants: Secondary | ICD-10-CM | POA: Diagnosis not present

## 2017-09-16 DIAGNOSIS — I480 Paroxysmal atrial fibrillation: Secondary | ICD-10-CM | POA: Diagnosis not present

## 2017-10-19 DIAGNOSIS — I480 Paroxysmal atrial fibrillation: Secondary | ICD-10-CM | POA: Diagnosis not present

## 2017-10-19 DIAGNOSIS — Z7901 Long term (current) use of anticoagulants: Secondary | ICD-10-CM | POA: Diagnosis not present

## 2017-10-20 ENCOUNTER — Other Ambulatory Visit: Payer: Self-pay | Admitting: Physician Assistant

## 2017-11-04 DIAGNOSIS — H11003 Unspecified pterygium of eye, bilateral: Secondary | ICD-10-CM | POA: Diagnosis not present

## 2017-11-04 DIAGNOSIS — H52203 Unspecified astigmatism, bilateral: Secondary | ICD-10-CM | POA: Diagnosis not present

## 2017-11-04 DIAGNOSIS — H04123 Dry eye syndrome of bilateral lacrimal glands: Secondary | ICD-10-CM | POA: Diagnosis not present

## 2017-11-04 DIAGNOSIS — H0012 Chalazion right lower eyelid: Secondary | ICD-10-CM | POA: Diagnosis not present

## 2017-11-18 ENCOUNTER — Telehealth: Payer: Self-pay

## 2017-11-18 ENCOUNTER — Other Ambulatory Visit: Payer: Self-pay

## 2017-11-18 ENCOUNTER — Inpatient Hospital Stay (HOSPITAL_COMMUNITY)
Admission: EM | Admit: 2017-11-18 | Discharge: 2017-11-19 | DRG: 190 | Disposition: A | Discharge status: Nursing Home (Includes ICF) | Payer: Medicare Other | Attending: Internal Medicine | Admitting: Internal Medicine

## 2017-11-18 ENCOUNTER — Emergency Department (HOSPITAL_COMMUNITY): Payer: Medicare Other

## 2017-11-18 ENCOUNTER — Encounter (HOSPITAL_COMMUNITY): Payer: Self-pay

## 2017-11-18 DIAGNOSIS — J9621 Acute and chronic respiratory failure with hypoxia: Secondary | ICD-10-CM | POA: Diagnosis present

## 2017-11-18 DIAGNOSIS — J449 Chronic obstructive pulmonary disease, unspecified: Secondary | ICD-10-CM | POA: Diagnosis not present

## 2017-11-18 DIAGNOSIS — R05 Cough: Secondary | ICD-10-CM | POA: Diagnosis not present

## 2017-11-18 DIAGNOSIS — F419 Anxiety disorder, unspecified: Secondary | ICD-10-CM | POA: Diagnosis present

## 2017-11-18 DIAGNOSIS — I4581 Long QT syndrome: Secondary | ICD-10-CM | POA: Diagnosis present

## 2017-11-18 DIAGNOSIS — J441 Chronic obstructive pulmonary disease with (acute) exacerbation: Secondary | ICD-10-CM | POA: Diagnosis present

## 2017-11-18 DIAGNOSIS — I5032 Chronic diastolic (congestive) heart failure: Secondary | ICD-10-CM | POA: Diagnosis not present

## 2017-11-18 DIAGNOSIS — J209 Acute bronchitis, unspecified: Secondary | ICD-10-CM | POA: Diagnosis present

## 2017-11-18 DIAGNOSIS — R0600 Dyspnea, unspecified: Secondary | ICD-10-CM

## 2017-11-18 DIAGNOSIS — J440 Chronic obstructive pulmonary disease with acute lower respiratory infection: Secondary | ICD-10-CM | POA: Diagnosis not present

## 2017-11-18 DIAGNOSIS — I10 Essential (primary) hypertension: Secondary | ICD-10-CM

## 2017-11-18 DIAGNOSIS — Z87891 Personal history of nicotine dependence: Secondary | ICD-10-CM

## 2017-11-18 DIAGNOSIS — E876 Hypokalemia: Secondary | ICD-10-CM | POA: Diagnosis not present

## 2017-11-18 DIAGNOSIS — Z885 Allergy status to narcotic agent status: Secondary | ICD-10-CM

## 2017-11-18 DIAGNOSIS — I50812 Chronic right heart failure: Secondary | ICD-10-CM

## 2017-11-18 DIAGNOSIS — Z7901 Long term (current) use of anticoagulants: Secondary | ICD-10-CM

## 2017-11-18 DIAGNOSIS — J9601 Acute respiratory failure with hypoxia: Secondary | ICD-10-CM

## 2017-11-18 DIAGNOSIS — Z66 Do not resuscitate: Secondary | ICD-10-CM | POA: Diagnosis present

## 2017-11-18 DIAGNOSIS — I481 Persistent atrial fibrillation: Secondary | ICD-10-CM | POA: Diagnosis present

## 2017-11-18 DIAGNOSIS — I2721 Secondary pulmonary arterial hypertension: Secondary | ICD-10-CM | POA: Diagnosis not present

## 2017-11-18 DIAGNOSIS — I110 Hypertensive heart disease with heart failure: Secondary | ICD-10-CM | POA: Diagnosis present

## 2017-11-18 DIAGNOSIS — N289 Disorder of kidney and ureter, unspecified: Secondary | ICD-10-CM | POA: Diagnosis present

## 2017-11-18 DIAGNOSIS — Z853 Personal history of malignant neoplasm of breast: Secondary | ICD-10-CM

## 2017-11-18 DIAGNOSIS — Z79899 Other long term (current) drug therapy: Secondary | ICD-10-CM

## 2017-11-18 DIAGNOSIS — R0902 Hypoxemia: Secondary | ICD-10-CM

## 2017-11-18 DIAGNOSIS — I482 Chronic atrial fibrillation: Secondary | ICD-10-CM | POA: Diagnosis not present

## 2017-11-18 DIAGNOSIS — M109 Gout, unspecified: Secondary | ICD-10-CM | POA: Diagnosis present

## 2017-11-18 DIAGNOSIS — R0602 Shortness of breath: Secondary | ICD-10-CM | POA: Diagnosis not present

## 2017-11-18 DIAGNOSIS — I491 Atrial premature depolarization: Secondary | ICD-10-CM | POA: Diagnosis not present

## 2017-11-18 DIAGNOSIS — I4891 Unspecified atrial fibrillation: Secondary | ICD-10-CM | POA: Diagnosis not present

## 2017-11-18 LAB — COMPREHENSIVE METABOLIC PANEL
ALBUMIN: 3.7 g/dL (ref 3.5–5.0)
ALT: 16 U/L (ref 0–44)
ANION GAP: 16 — AB (ref 5–15)
AST: 26 U/L (ref 15–41)
Alkaline Phosphatase: 108 U/L (ref 38–126)
BUN: 22 mg/dL (ref 8–23)
CO2: 23 mmol/L (ref 22–32)
Calcium: 9.5 mg/dL (ref 8.9–10.3)
Chloride: 97 mmol/L — ABNORMAL LOW (ref 98–111)
Creatinine, Ser: 1 mg/dL (ref 0.44–1.00)
GFR calc Af Amer: 55 mL/min — ABNORMAL LOW (ref >60)
GFR, EST NON AFRICAN AMERICAN: 48 mL/min — AB (ref >60)
Glucose, Bld: 149 mg/dL — ABNORMAL HIGH (ref 70–99)
POTASSIUM: 3.7 mmol/L (ref 3.5–5.1)
Sodium: 136 mmol/L (ref 135–145)
Total Bilirubin: 4.9 mg/dL — ABNORMAL HIGH (ref 0.3–1.2)
Total Protein: 7 g/dL (ref 6.5–8.1)

## 2017-11-18 LAB — URINALYSIS, ROUTINE W REFLEX MICROSCOPIC
BILIRUBIN URINE: NEGATIVE
GLUCOSE, UA: NEGATIVE mg/dL
KETONES UR: NEGATIVE mg/dL
Leukocytes, UA: NEGATIVE
Nitrite: NEGATIVE
PROTEIN: 100 mg/dL — AB
Specific Gravity, Urine: 1.011 (ref 1.005–1.030)
pH: 6 (ref 5.0–8.0)

## 2017-11-18 LAB — BRAIN NATRIURETIC PEPTIDE: B NATRIURETIC PEPTIDE 5: 902.1 pg/mL — AB (ref 0.0–100.0)

## 2017-11-18 LAB — I-STAT TROPONIN, ED: Troponin i, poc: 0.03 ng/mL (ref 0.00–0.08)

## 2017-11-18 LAB — CBC
HCT: 43.3 % (ref 36.0–46.0)
Hemoglobin: 14.7 g/dL (ref 12.0–15.0)
MCH: 36.3 pg — AB (ref 26.0–34.0)
MCHC: 33.9 g/dL (ref 30.0–36.0)
MCV: 106.9 fL — ABNORMAL HIGH (ref 78.0–100.0)
PLATELETS: 158 10*3/uL (ref 150–400)
RBC: 4.05 MIL/uL (ref 3.87–5.11)
RDW: 13.6 % (ref 11.5–15.5)
WBC: 18.3 10*3/uL — ABNORMAL HIGH (ref 4.0–10.5)

## 2017-11-18 LAB — INFLUENZA PANEL BY PCR (TYPE A & B)
INFLBPCR: NEGATIVE
Influenza A By PCR: NEGATIVE

## 2017-11-18 LAB — STREP PNEUMONIAE URINARY ANTIGEN: STREP PNEUMO URINARY ANTIGEN: NEGATIVE

## 2017-11-18 LAB — PROTIME-INR
INR: 1.89
PROTHROMBIN TIME: 21.5 s — AB (ref 11.4–15.2)

## 2017-11-18 LAB — MAGNESIUM: Magnesium: 1.8 mg/dL (ref 1.7–2.4)

## 2017-11-18 LAB — PHOSPHORUS: PHOSPHORUS: 3.2 mg/dL (ref 2.5–4.6)

## 2017-11-18 LAB — TSH: TSH: 1.602 u[IU]/mL (ref 0.350–4.500)

## 2017-11-18 MED ORDER — FLUTICASONE PROPIONATE 50 MCG/ACT NA SUSP
1.0000 | Freq: Every day | NASAL | Status: DC
  Filled 2017-11-18: qty 16

## 2017-11-18 MED ORDER — SODIUM CHLORIDE 0.9% FLUSH
3.0000 mL | Freq: Two times a day (BID) | INTRAVENOUS | Status: DC
  Administered 2017-11-18 – 2017-11-19 (×2): 3 mL via INTRAVENOUS

## 2017-11-18 MED ORDER — ACETAMINOPHEN 325 MG PO TABS: 650.0000 mg | ORAL_TABLET | Freq: Four times a day (QID) | ORAL | Status: DC | PRN

## 2017-11-18 MED ORDER — FUROSEMIDE 20 MG PO TABS
80.0000 mg | ORAL_TABLET | Freq: Every day | ORAL | Status: DC
  Administered 2017-11-19: 80 mg via ORAL
  Filled 2017-11-18: qty 4

## 2017-11-18 MED ORDER — SODIUM CHLORIDE 0.9 % IV SOLN
INTRAVENOUS | Status: DC | PRN
  Administered 2017-11-18: 15:00:00 via INTRAVENOUS

## 2017-11-18 MED ORDER — ALLOPURINOL 100 MG PO TABS
100.0000 mg | ORAL_TABLET | Freq: Every day | ORAL | Status: DC
  Administered 2017-11-18: 100 mg via ORAL
  Filled 2017-11-18: qty 1

## 2017-11-18 MED ORDER — BUDESONIDE 0.5 MG/2ML IN SUSP
0.5000 mg | Freq: Two times a day (BID) | RESPIRATORY_TRACT | Status: DC
  Administered 2017-11-18 – 2017-11-19 (×2): 0.5 mg via RESPIRATORY_TRACT
  Filled 2017-11-18 (×2): qty 2

## 2017-11-18 MED ORDER — ONDANSETRON HCL 4 MG/2ML IJ SOLN
4.0000 mg | Freq: Four times a day (QID) | INTRAMUSCULAR | Status: DC | PRN
  Administered 2017-11-18: 4 mg via INTRAVENOUS
  Filled 2017-11-18: qty 2

## 2017-11-18 MED ORDER — AZITHROMYCIN 250 MG PO TABS
500.0000 mg | ORAL_TABLET | ORAL | Status: DC
  Administered 2017-11-18: 500 mg via ORAL
  Filled 2017-11-18: qty 2

## 2017-11-18 MED ORDER — IPRATROPIUM-ALBUTEROL 0.5-2.5 (3) MG/3ML IN SOLN
3.0000 mL | Freq: Three times a day (TID) | RESPIRATORY_TRACT | Status: DC
  Administered 2017-11-18 (×2): 3 mL via RESPIRATORY_TRACT
  Filled 2017-11-18: qty 3

## 2017-11-18 MED ORDER — ALPRAZOLAM 0.25 MG PO TABS
0.5000 mg | ORAL_TABLET | Freq: Every day | ORAL | Status: DC
  Administered 2017-11-18: 0.5 mg via ORAL
  Filled 2017-11-18: qty 2

## 2017-11-18 MED ORDER — PREDNISONE 20 MG PO TABS
20.0000 mg | ORAL_TABLET | Freq: Every day | ORAL | Status: DC
  Administered 2017-11-19: 20 mg via ORAL
  Filled 2017-11-18: qty 1

## 2017-11-18 MED ORDER — IPRATROPIUM-ALBUTEROL 0.5-2.5 (3) MG/3ML IN SOLN
RESPIRATORY_TRACT | Status: AC
  Filled 2017-11-18: qty 3

## 2017-11-18 MED ORDER — SODIUM CHLORIDE 0.9 % IV SOLN
1.0000 g | INTRAVENOUS | Status: DC
  Filled 2017-11-18: qty 10

## 2017-11-18 MED ORDER — METOPROLOL SUCCINATE ER 50 MG PO TB24
50.0000 mg | ORAL_TABLET | Freq: Every day | ORAL | Status: DC
  Administered 2017-11-19: 50 mg via ORAL
  Filled 2017-11-18: qty 1

## 2017-11-18 MED ORDER — WARFARIN SODIUM 4 MG PO TABS
4.0000 mg | ORAL_TABLET | Freq: Once | ORAL | Status: AC
  Administered 2017-11-18: 4 mg via ORAL
  Filled 2017-11-18: qty 1

## 2017-11-18 MED ORDER — SODIUM CHLORIDE 0.9% FLUSH: 3.0000 mL | INTRAVENOUS | Status: DC | PRN

## 2017-11-18 MED ORDER — SODIUM CHLORIDE 0.9 % IV SOLN
1.0000 g | Freq: Once | INTRAVENOUS | Status: AC
  Administered 2017-11-18: 1 g via INTRAVENOUS
  Filled 2017-11-18: qty 10

## 2017-11-18 MED ORDER — POTASSIUM CHLORIDE ER 10 MEQ PO TBCR
10.0000 meq | EXTENDED_RELEASE_TABLET | Freq: Every day | ORAL | Status: DC
  Administered 2017-11-18: 10 meq via ORAL
  Filled 2017-11-18 (×3): qty 1

## 2017-11-18 MED ORDER — GUAIFENESIN-DM 100-10 MG/5ML PO SYRP
5.0000 mL | ORAL_SOLUTION | ORAL | Status: DC | PRN
  Administered 2017-11-18: 5 mL via ORAL
  Filled 2017-11-18: qty 5

## 2017-11-18 MED ORDER — LORATADINE 10 MG PO TABS
10.0000 mg | ORAL_TABLET | Freq: Every day | ORAL | Status: DC
  Administered 2017-11-18 – 2017-11-19 (×2): 10 mg via ORAL
  Filled 2017-11-18 (×2): qty 1

## 2017-11-18 MED ORDER — SODIUM CHLORIDE 0.9 % IV SOLN: 250.0000 mL | INTRAVENOUS | Status: DC | PRN

## 2017-11-18 MED ORDER — WARFARIN - PHARMACIST DOSING INPATIENT: Freq: Every day | Status: DC

## 2017-11-18 MED ORDER — INFLUENZA VAC SPLIT HIGH-DOSE 0.5 ML IM SUSY
0.5000 mL | PREFILLED_SYRINGE | INTRAMUSCULAR | Status: DC
  Filled 2017-11-18: qty 0.5

## 2017-11-18 MED ORDER — ONDANSETRON HCL 4 MG PO TABS: 4.0000 mg | ORAL_TABLET | Freq: Four times a day (QID) | ORAL | Status: DC | PRN

## 2017-11-18 MED ORDER — DM-GUAIFENESIN ER 30-600 MG PO TB12
1.0000 | ORAL_TABLET | Freq: Two times a day (BID) | ORAL | Status: DC
  Administered 2017-11-18 – 2017-11-19 (×2): 1 via ORAL
  Filled 2017-11-18 (×2): qty 1

## 2017-11-18 MED ORDER — FUROSEMIDE 10 MG/ML IJ SOLN
40.0000 mg | INTRAMUSCULAR | Status: AC
  Administered 2017-11-18: 40 mg via INTRAVENOUS
  Filled 2017-11-18: qty 4

## 2017-11-18 NOTE — ED Notes (Signed)
Report attempted 

## 2017-11-18 NOTE — ED Provider Notes (Signed)
[facility] EMERGENCY DEPARTMENT Provider Note   CSN: [id number] Arrival date & time: 11/18/17  1238     History   Chief Complaint Chief Complaint  Patient presents with  . Atrial Fibrillation    HPI [name] is a 84 y.o. female.  84 year old female with past mental history including atrial fibrillation on Coumadin, breast cancer, [place] artery hypertension presents with cough and malaise.  Patient states that she began not feeling well yesterday afternoon including generalized weakness and feeling tired.  She had one episode of vomiting last night.  She has had 2 days of cough associated with nasal drainage and congestion.  She reports some shortness of breath.  She has had nausea but no ongoing vomiting.  She is not aware of any fevers.  No chest pain.  She has noticed some swelling in her legs recently.  At her facility she was noted to have atrial fibrillation and was sent here for further evaluation. No urinary symptoms.  The history is provided by the patient.  Atrial Fibrillation     Past Medical History:  Diagnosis Date  . Breast cancer ([place])    left   . Chronic anticoagulation   . Chronic atrial fibrillation ([place])   . Hypertension   . Pelvic fracture ([place]) 05/2016    Patient Active Problem List   Diagnosis Date Noted  . Acute respiratory failure with hypoxia ([place]) 11/18/2017  . PAH (pulmonary artery hypertension) ([place]) 03/25/2017  . Chronic obstructive pulmonary disease ([place]) 03/25/2017  . Hypertension   . Breast cancer ([place])   . Constipation 07/10/2016  . Multiple closed fractures of pelvis with stable disruption of pelvic circle ([place]) 06/16/2016  . Closed nondisplaced fracture of fifth metacarpal bone of left hand 06/16/2016  . Pelvic fracture ([place]) 05/31/2016  . Dyspnea on exertion 11/06/2014  . Orthostasis 11/06/2014  . Chest tightness 06/13/2013  . Essential hypertension 06/13/2013  . Cor pulmonale, chronic ([place]) 06/13/2013  .  Chronic atrial fibrillation ([place]) 06/13/2013  . Chronic anticoagulation 06/13/2013    Past Surgical History:  Procedure Laterality Date  . ABDOMINAL HYSTERECTOMY    . BREAST LUMPECTOMY       OB History   None      Home Medications    Prior to Admission medications   Medication Sig Start Date End Date Taking? Authorizing Provider  acetaminophen (TYLENOL) 500 MG tablet Take 1,000 mg by mouth at bedtime.   Yes [provider]  allopurinol (ZYLOPRIM) 100 MG tablet Take 100 mg by mouth at bedtime.  03/16/16  Yes [provider]  ALPRAZolam Duanne Moron) 0.5 MG tablet Take 0.5 mg by mouth at bedtime.  05/18/13  Yes [provider]  furosemide (LASIX) 40 MG tablet TAKE 2 TABLETS BY MOUTH ONCE DAILY. 10/20/17  Yes Croitoru, Mihai, MD  metoprolol succinate (TOPROL-XL) 50 MG 24 hr tablet Take 1 tablet (50 mg total) by mouth daily. 07/19/17  Yes Croitoru, Mihai, MD  potassium chloride (K-DUR) 10 MEQ tablet Take 1 tablet (10 mEq total) by mouth daily. 07/19/17  Yes Croitoru, Mihai, MD  warfarin (COUMADIN) 2 MG tablet Take 2-4 mg by mouth daily. Pt takes one tablet daily (2 mg) daily except for on Thursday pt takes two tablets (4 mg)   Yes [provider]    Family History Family History  Problem Relation Age of Onset  . Heart disease Mother   . Ulcers Father   . Colon cancer Father   . Asthma Sister   .  Asthma Son     Social History Social History   Tobacco Use  . Smoking status: Former Smoker    Packs/day: 0.20    Years: 5.00    Pack years: 1.00    Types: Cigarettes  . Smokeless tobacco: Never Used  . Tobacco comment: pt has not smoked in 50 years  Substance Use Topics  . Alcohol use: Yes    Alcohol/week: 2.0 standard drinks    Types: 2 Glasses of wine per week  . Drug use: No     Allergies   Codeine   Review of Systems Review of Systems All other systems reviewed and are negative except that which was mentioned in HPI   Physical  Exam Updated Vital Signs BP (!) 141/83   Pulse 83   Resp 19   Ht _0  (1.626 m)   Wt 56.7 kg   LMP  (LMP Unknown)   SpO2 97%   BMI 21.46 kg/m   Physical Exam  Constitutional: She is oriented to person, place, and time. She appears well-developed and well-nourished. No distress.  HENT:  Head: Normocephalic and atraumatic.  Moist mucous membranes  Eyes: Conjunctivae are normal.  Neck: Neck supple.  Cardiovascular: Normal rate and normal heart sounds. An irregularly irregular rhythm present.  Pulmonary/Chest: Effort normal. She has no wheezes.  Diminished BS b/l  Abdominal: Soft. Bowel sounds are normal. She exhibits no distension. There is no tenderness.  Musculoskeletal: She exhibits edema.  Trace BLE  Neurological: She is alert and oriented to person, place, and time.  Fluent speech  Skin: Skin is warm and dry.  Psychiatric: She has a normal mood and affect. Judgment normal.  Nursing note and vitals reviewed.    ED Treatments / Results  Labs (all labs ordered are listed, but only abnormal results are displayed) Labs Reviewed  COMPREHENSIVE METABOLIC PANEL - Abnormal; Notable for the following components:      Result Value   Chloride 97 (*)    Glucose, Bld 149 (*)    Total Bilirubin 4.9 (*)    GFR calc non Af Amer 48 (*)    GFR calc Af Amer 55 (*)    Anion gap 16 (*)    All other components within normal limits  BRAIN NATRIURETIC PEPTIDE - Abnormal; Notable for the following components:   B Natriuretic Peptide 902.1 (*)    All other components within normal limits  PROTIME-INR - Abnormal; Notable for the following components:   Prothrombin Time 21.5 (*)    All other components within normal limits  CBC - Abnormal; Notable for the following components:   WBC 18.3 (*)    MCV 106.9 (*)    MCH 36.3 (*)    All other components within normal limits  URINALYSIS, ROUTINE W REFLEX MICROSCOPIC - Abnormal; Notable for the following components:   Color, Urine AMBER (*)     APPearance CLOUDY (*)    Hgb urine dipstick MODERATE (*)    Protein, ur 100 (*)    Bacteria, UA MANY (*)    Non Squamous Epithelial 0-5 (*)    All other components within normal limits  INFLUENZA PANEL BY PCR (TYPE A & B)  I-STAT TROPONIN, ED    EKG EKG Interpretation  Date/Time:  Thursday November 18 2017 12:46:36 EDT Ventricular Rate:  82 PR Interval:    QRS Duration: 136 QT Interval:  429 QTC Calculation: 502 R Axis:   -86 Text Interpretation:  Atrial fibrillation Ventricular premature complex Nonspecific IVCD  with LAD LVH with secondary repolarization abnormality Anterior ST elevation, probably due to LVH Artifact in lead(s) I III aVL similar to previous, occasional PVC Confirmed by Theotis Burrow [phone]) on 11/18/2017 1:04:28 PM   Radiology Dg Chest 2 View  Result Date: 11/18/2017 CLINICAL DATA:  Cough, atrial fibrillation with rapid ventricular response. EXAM: CHEST - 2 VIEW COMPARISON:  PA and lateral chest x-ray of June 15, 2016 FINDINGS: The lungs are hyperinflated with hemidiaphragm flattening. There is no focal infiltrate. The heart is top-normal in size. The pulmonary vascularity is normal. There is calcification in the wall of the aortic arch. There surgical clips in the left axillary region. The bony thorax exhibits no acute abnormality. IMPRESSION: Chronic bronchitic-smoking related changes.  No pneumonia nor CHF. Thoracic aortic atherosclerosis. Electronically Signed   By: David  Martinique M.D.   On: 11/18/2017 13:29    Procedures Procedures (including critical care time)  Medications Ordered in ED Medications  0.9 %  sodium chloride infusion ( Intravenous New Bag/Given 11/18/17 1443)  ipratropium-albuterol (DUONEB) 0.5-2.5 (3) MG/3ML nebulizer solution 3 mL (3 mLs Nebulization Given 11/18/17 1705)  predniSONE (DELTASONE) tablet 20 mg (has no administration in time range)  loratadine (CLARITIN) tablet 10 mg (has no administration in time range)  fluticasone  (FLONASE) 50 MCG/ACT nasal spray 1 spray (has no administration in time range)  ipratropium-albuterol (DUONEB) 0.5-2.5 (3) MG/3ML nebulizer solution (has no administration in time range)  cefTRIAXone (ROCEPHIN) 1 g in sodium chloride 0.9 % 100 mL IVPB (0 g Intravenous Stopped 11/18/17 1510)  furosemide (LASIX) injection 40 mg (40 mg Intravenous Given 11/18/17 1503)     Initial Impression / Assessment and Plan / ED Course  I have reviewed the triage vital signs and the nursing notes.  Pertinent labs & imaging results that were available during my care of the patient were reviewed by me and considered in my medical decision making (see chart for details).     Nontoxic on exam, requiring 2 to 3 L oxygen to maintain normal saturation.  Her lab work is notable for WBC 18,000, creatinine, negative troponin, BNP 902.  Chest x-ray shows chronic bronchitic changes, no overt pulmonary edema.  I suspect the patient may have early community-acquired pneumonia given her leukocytosis, hypoxia, and infectious symptoms.  Gave dose of ceftriaxone.  She may also have a component of volume overload given her elevated BNP and complaint of lower extremity edema.  Gave dose of IV Lasix.  Discussed admission with [facility], Dr. Dyann Kief, given the new oxygen requirement.  Patient admitted for further care. Final Clinical Impressions(s) / ED Diagnoses   Final diagnoses:  Dyspnea    ED Discharge Orders    None       Little, Wenda Overland, MD 11/18/17 [phone]

## 2017-11-18 NOTE — Telephone Encounter (Signed)
Spoke with the the pt's nurse at [facility].  The pt was asymptomatic until yesterday evening. She vomited after having dinner. Her vital signs were ok.The pt felt fine after and went to bed feeling fine.  When the pt woke up this morning she felt awful. Her vital signs before her medications BP 150/100 her heartrate was erratic ranging from 6-120pm.  The pt O2 sat dropped to 83% for a short period of time and on recheck 93%.  Pt feels weak and sob. She was given her Metoprolol 174m and Fursosemide 852maround 11:15am.  Discussed with our office DOD DrMount SterlingHer recommendation is for the pt to go to the ED. Adv her daughter DeeDee of Dr.Randoplh's recommendation.  [facility] call EMS [facility]

## 2017-11-18 NOTE — H&P (Signed)
History and Physical    [name] AXK:[id number] DOB: Jul 01, 1924 DOA: 11/18/2017  Referring MD/NP/PA: Dr. Rex Kras  PCP: Shon Baton, MD  Patient coming from: [place].   Chief Complaint: SOB, productive cough, rhinorrhea and general malaise.  HPI: [name] is a 84 y.o. female with a past medical history significant for breast cancer (left; on remission), hypertension, chronic atrial fibrillation (chronically on Coumadin), COPD, pulmonary arterial hypertension, gout and anxiety; who presented to the emergency department secondary to 2 to 3 days of rhinorrhea, general malaise, productive cough and shortness of breath.  Patient reports that her symptoms even has been present for even longer periods of time than just 3 days they had gotten worse and she has escalated to require oxygen supplementation.  Patient also expressed experiencing overnight an episode of vomiting and feeling her heart racing.  She denies abdominal pain, fevers, nausea, focal weakness, dysuria, hematuria, melena, hematochezia or any other acute complaints. Patient reports of being compliant with her medication and also low-sodium diet.  Per records review she has been struggling with chronic difficulty breathing especially with exertion for quite some time but has been fighting not to use oxygen supplementation.  Patient lives independently at [place].  In the ED blood work demonstrated elevated WBCs, elevated BNP and her chest x-ray 8 demonstrated bronchitic changes.  Patient was on atrial fibrillation with rate control.  Patient started on antibiotic for community-acquired pneumonia and received 1 dose of IV Lasix.  She has been placed on 3 L nasal cannula oxygen supplementation to assist with her shortness of breath.  Cardiology has been consulted and [facility] has been called to admit patient for further evaluation and management.  Past Medical/Surgical History: Past Medical History:  Diagnosis Date  . Breast  cancer ([place])    left   . Chronic anticoagulation   . Chronic atrial fibrillation ([place])   . Hypertension   . Pelvic fracture ([place]) 05/2016    Past Surgical History:  Procedure Laterality Date  . ABDOMINAL HYSTERECTOMY    . BREAST LUMPECTOMY      Social History:  reports that she has quit smoking. Her smoking use included cigarettes. She has a 1.00 pack-year smoking history. She has never used smokeless tobacco. She reports that she drinks about 2.0 standard drinks of alcohol per week. She reports that she does not use drugs.  Allergies: Allergies  Allergen Reactions  . Codeine Nausea Only    Nausea     Family History:  Family History  Problem Relation Age of Onset  . Heart disease Mother   . Ulcers Father   . Colon cancer Father   . Asthma Sister   . Asthma Son     Prior to Admission medications   Medication Sig Start Date End Date Taking? Authorizing Provider  acetaminophen (TYLENOL) 500 MG tablet Take 1,000 mg by mouth at bedtime.   Yes [provider]  allopurinol (ZYLOPRIM) 100 MG tablet Take 100 mg by mouth at bedtime.  03/16/16  Yes [provider]  ALPRAZolam Duanne Moron) 0.5 MG tablet Take 0.5 mg by mouth at bedtime.  05/18/13  Yes [provider]  furosemide (LASIX) 40 MG tablet TAKE 2 TABLETS BY MOUTH ONCE DAILY. 10/20/17  Yes Croitoru, Mihai, MD  metoprolol succinate (TOPROL-XL) 50 MG 24 hr tablet Take 1 tablet (50 mg total) by mouth daily. 07/19/17  Yes Croitoru, Mihai, MD  potassium chloride (K-DUR) 10 MEQ tablet Take 1 tablet (10 mEq total) by mouth daily. 07/19/17  Yes Croitoru,  Mihai, MD  warfarin (COUMADIN) 2 MG tablet Take 2-4 mg by mouth daily. Pt takes one tablet daily (2 mg) daily except for on Thursday pt takes two tablets (4 mg)   Yes [provider]    Review of Systems: Negative except as otherwise mentioned in HPI.  Physical Exam: Vitals:   11/18/17 1615 11/18/17 1630 11/18/17 1700 11/18/17 1707  BP: (!) 136/96 (!)  141/83 (!) 135/91   Pulse: 80 83 76   Resp: _0 SpO2: 98% 97% 98% 98%  Weight:      Height:        Constitutional: Mild distress secondary to active coughing spells and feeling short of breath.  Denies chest pain, nausea, vomiting, abdominal pain, dysuria, or any other complaints.  Patient wearing 3 L nasal cannula oxygen supplementation. Eyes: PERRL, lids and conjunctivae normal, no icterus, no nystagmus. ENMT: Mucous membranes are moist. Posterior pharynx clear of any exudate or lesions.no thrush on exam. Neck: normal, supple, no masses, no thyromegaly.  No JVD appreciated. Respiratory: Mild tachypnea and difficulty speaking in full sentences; positive expiratory wheezing and rhonchi.  No using accessory muscles. Cardiovascular: Regular rate; no rubs, no murmurs, no gallops, S1 and S2 appreciated.  Positive extra PVCs on examination. Abdomen: no tenderness, no masses palpated. No hepatosplenomegaly. Bowel sounds positive.  Musculoskeletal: no clubbing / cyanosis. No joint deformity upper and lower extremities. Good ROM, no contractures. Normal muscle tone.  No edema. Skin: no petechiae, open wounds or rash.  Patient with multiple bruises and chronic venous stasis dermatitis changes on her lower extremities bilaterally. Neurologic: CN 2-12 grossly intact. Sensation intact, DTR normal. Strength 5/5 in all 4.  Psychiatric: Normal judgment and insight. Alert and oriented x 3. Normal mood.    Labs on Admission: I have personally reviewed the following labs and imaging studies  CBC: Recent Labs  Lab 11/18/17 1259  WBC 18.3*  HGB 14.7  HCT 43.3  MCV 106.9*  PLT 798   Basic Metabolic Panel: Recent Labs  Lab 11/18/17 1259  NA 136  K 3.7  CL 97*  CO2 23  GLUCOSE 149*  BUN 22  CREATININE 1.00  CALCIUM 9.5   GFR: Estimated Creatinine Clearance: 31.6 mL/min (by C-G formula based on SCr of 1 mg/dL).   Liver Function Tests: Recent Labs  Lab 11/18/17 1259  AST 26  ALT  16  ALKPHOS 108  BILITOT 4.9*  PROT 7.0  ALBUMIN 3.7   Coagulation Profile: Recent Labs  Lab 11/18/17 1259  INR 1.89   Urine analysis:    Component Value Date/Time   COLORURINE AMBER (A) 11/18/2017 1349   APPEARANCEUR CLOUDY (A) 11/18/2017 1349   LABSPEC 1.011 11/18/2017 1349   PHURINE 6.0 11/18/2017 1349   GLUCOSEU NEGATIVE 11/18/2017 1349   HGBUR MODERATE (A) 11/18/2017 1349   BILIRUBINUR NEGATIVE 11/18/2017 1349   KETONESUR NEGATIVE 11/18/2017 1349   PROTEINUR 100 (A) 11/18/2017 1349   NITRITE NEGATIVE 11/18/2017 1349   LEUKOCYTESUR NEGATIVE 11/18/2017 1349   Radiological Exams on Admission: Dg Chest 2 View  Result Date: 11/18/2017 CLINICAL DATA:  Cough, atrial fibrillation with rapid ventricular response. EXAM: CHEST - 2 VIEW COMPARISON:  PA and lateral chest x-ray of June 15, 2016 FINDINGS: The lungs are hyperinflated with hemidiaphragm flattening. There is no focal infiltrate. The heart is top-normal in size. The pulmonary vascularity is normal. There is calcification in the wall of the aortic arch. There surgical clips in the left axillary region. The bony  thorax exhibits no acute abnormality. IMPRESSION: Chronic bronchitic-smoking related changes.  No pneumonia nor CHF. Thoracic aortic atherosclerosis. Electronically Signed   By: David  Martinique M.D.   On: 11/18/2017 13:29    EKG: Independently reviewed.  Atrial fibrillation with ventricular controlled response.  Some frequent PVCs appreciated.  Left axis deviation, no ischemic changes or repolarization. prolonged QT.  Assessment/Plan 1-acute respiratory failure with hypoxia ([place]): In the setting of bronchitis/early community-acquired pneumonia, potential mild exacerbation of COPD and also underlying pulmonary hypertension. -Patient is currently afebrile, but demonstrating elevated WBCs, productive cough, general malaise, rhinorrhea and hypoxia. -Chest x-ray with bronchitic changes but no frank infiltrates. -Patient with  mild expiratory wheezing and rhonchi on auscultation. -Will treat with medications for early community-acquired pneumonia (Rocephin and Zithromax); her curb 65 is more than 2. -Initiate low-dose steroid for the next 5 days, duo nebs, Pulmicort, Flonase and Claritin. -Oxygen supplementation to be provided and wean as tolerated. -Will check sputum culture, blood culture, urine culture, strep pneumo and Legionella antigen in urine and influenza PCR.. -Provide supportive care and follow clinical response. -patient O2 sat on admission 83% on RA  2-chronic atrial fibrillation: With complaints of overnight palpitations and heart skipping beats. -Patient chronically on atrial fibrillation and as well has been seen on telemetry and EKG in the ED. -Rate is controlled and the patient is chronically on Coumadin. -Her symptoms most likely associated with extra PVCs in the setting of problem #1. -Appreciate cardiology service who have seen the patient and provided recommendations. -Continue metoprolol at current dose for rate control -Continue Coumadin for secondary prevention -Patient's CHADsVASC score >3 -will monitor on telemetry for now (if stable for 24 hours or so, d/c telemetry)  3-Chronic anticoagulation -Continue Coumadin per pharmacy dosing -Follow INR  4-Hypertension -overall stable and well controlled -will continue current antihypertensive regimen.  5-chronic diastolic HF -last Echo with preserved EF -currently compensated and euvolemic on exam. -BNP elevated, but probably in the setting of pulmonary disease. -will continue home dose of lasix -continue b-blocker, follow daily weights and strict intake and output -heart healthy diet ordered.  6-PAH (pulmonary artery hypertension) ([facility]) -chronic -patient might need oxygen supplementation long term -will treat acute component and follow response -appreciate cardiology service assistance and rec's  7-COPD with acute bronchitis  ([place]) -patient was not using any inhalers at home -wheezing appreciated on exam; and most likely from acute bronchitis she is having mild exacerbation. -will provide 5 days of steroids, start pulmicort and duoneb -antibiotics for bronchitis/PNA as mentioned above. -mucinex and flutter valve -oxygen supplementation to be wean as tolerated.; currently requiring alsmot 3L [place].   8-prolonged QT -unchanged from EKG on 09/2017 -avoid medications that can pronlong QT further -monitor on telemetry -follow electrolytes.  9-anxiety -continue home dose of xanax.    DVT prophylaxis: Patient is chronically on Coumadin, which satisfied DVT prophylaxis. Code Status: DNR Family Communication: Daughter at bedside. Disposition Plan: [facility] once respiratory status improved. Consults called: Cardiology Admission status: Inpatient, length of stay more than 2 midnights; telemetry bed.   Time Spent: 65 minutes  Barton Dubois MD [facility] Pager [phone]  If 7PM-7AM, please contact night-coverage [email or web address] Password [facility]  11/18/2017, 5:51 PM

## 2017-11-18 NOTE — Progress Notes (Signed)
ANTICOAGULATION CONSULT NOTE - Initial Consult  Pharmacy Consult for warfarin Indication: atrial fibrillation  Allergies  Allergen Reactions  . Codeine Nausea Only    Nausea     Patient Measurements: Height: 5' 4" (162.6 cm) Weight: 125 lb (56.7 kg) IBW/kg (Calculated) : 54.7  Vital Signs: BP: 135/91 (09/19 1700) Pulse Rate: 76 (09/19 1700)  Labs: Recent Labs    11/18/17 1259  HGB 14.7  HCT 43.3  PLT 158  LABPROT 21.5*  INR 1.89  CREATININE 1.00    Estimated Creatinine Clearance: 31.6 mL/min (by C-G formula based on SCr of 1 mg/dL).  Assessment: CC/HPI: 84 yo f presenting with afib  PMH: afib pulm artery htn  Anticoag: warfarin pta for afib  PTA Dose 4 mg Thu, 2 mg AOD  Renal: SCr 1  Heme/Onc: H&H 14.7/43.3, Plt 158  Goal of Therapy:  INR 2-3 Monitor platelets by anticoagulation protocol: Yes   Plan:  Warfarin 4 mg x 1 Daily INR   Levester Fresh, PharmD, BCPS, BCCCP Clinical Pharmacist [phone]  Please check AMION for all [place] numbers  11/18/2017 6:02 PM

## 2017-11-18 NOTE — Progress Notes (Signed)
Cardiology Consultation:   Patient ID: [name] MRN: [id number]; DOB: [birth year]  Admit date: 11/18/2017 Date of Consult: 11/18/2017  Primary Care Provider: Shon Baton, MD Primary Cardiologist: Sanda Klein, MD  Primary Electrophysiologist:  None n/a  Patient Profile:   [name] is a 84 y.o. 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 Rex Kras.  History of Present Illness:   Ms. Abboud started having increased cough and upper airway congestion about 2 days ago and last night had an episode of vomiting.  However she went to bed feeling reasonably well, awoke this morning feeling ill, with increased shortness of breath at rest and with palpitations, but without chest pain.  She has not had fever or chills.  She is on chronic warfarin anticoagulation, therapeutic when last checked.  She has not had any bleeding problems  Past Medical History:  Diagnosis Date  . Breast cancer ([place])    left   . Chronic anticoagulation   . Chronic atrial fibrillation ([place])   . Hypertension   . Pelvic fracture ([place]) 05/2016    Past Surgical History:  Procedure Laterality Date  . ABDOMINAL HYSTERECTOMY    . BREAST LUMPECTOMY       Home Medications:  Prior to Admission medications   Medication Sig Start Date End Date Taking? Authorizing Provider  allopurinol (ZYLOPRIM) 100 MG tablet  03/16/16   [provider]  ALPRAZolam Duanne Moron) 0.5 MG tablet Take 0.5 mg by mouth at bedtime.  05/18/13   [provider]  furosemide (LASIX) 40 MG tablet TAKE 2 TABLETS BY MOUTH ONCE DAILY. 10/20/17   Shey Yott, MD  metoprolol succinate (TOPROL-XL) 50 MG 24 hr tablet Take 1 tablet (50 mg total) by mouth daily. 07/19/17   Karlynn Furrow, MD  potassium chloride (K-DUR) 10 MEQ tablet Take  1 tablet (10 mEq total) by mouth daily. 07/19/17   Fredda Clarida, MD  warfarin (COUMADIN) 2 MG tablet Take 2 mg by mouth daily.    [provider]    Inpatient Medications: Scheduled Meds:  Continuous Infusions:  PRN Meds:   Allergies:    Allergies  Allergen Reactions  . Codeine Nausea Only    Nausea     Social History:   Social History   Socioeconomic History  . Marital status: Married    Spouse name: Not on file  . Number of children: Not on file  . Years of education: Not on file  . Highest education level: Not on file  Occupational History  . Not on file  Social Needs  . Financial resource strain: Not on file  . Food insecurity:    Worry: Not on file    Inability: Not on file  . Transportation needs:    Medical: Not on file    Non-medical: Not on file  Tobacco Use  . Smoking status: Former Smoker    Packs/day: 0.20    Years: 5.00    Pack years: 1.00    Types: Cigarettes  . Smokeless tobacco: Never Used  . Tobacco comment: pt has not smoked in 50 years  Substance and Sexual Activity  . Alcohol use: Yes    Alcohol/week: 2.0 standard drinks    Types: 2 Glasses of wine per week  . Drug use: No  . Sexual activity: Not on file  Lifestyle  . Physical  activity:    Days per week: Not on file    Minutes per session: Not on file  . Stress: Not on file  Relationships  . Social connections:    Talks on phone: Not on file    Gets together: Not on file    Attends religious service: Not on file    Active member of club or organization: Not on file    Attends meetings of clubs or organizations: Not on file    Relationship status: Not on file  . Intimate partner violence:    Fear of current or ex partner: Not on file    Emotionally abused: Not on file    Physically abused: Not on file    Forced sexual activity: Not on file  Other Topics Concern  . Not on file  Social History Narrative  . Not on file    Family History:    Family History    Problem Relation Age of Onset  . Heart disease Mother   . Ulcers Father   . Colon cancer Father   . Asthma Sister   . Asthma Son      ROS:  Please see the history of present illness.   All other ROS reviewed and negative.     Physical Exam/Data:   Vitals:   11/18/17 1249 11/18/17 1250 11/18/17 1252  BP: (!) 146/91    Pulse: 90    Resp:  18   SpO2: 98%    Weight:   56.7 kg  Height:   _0  (1.626 m)   No intake or output data in the 24 hours ending 11/18/17 1258 Filed Weights   11/18/17 1252  Weight: 56.7 kg   Body mass index is 21.46 kg/m.  General: Very lean but not overtly malnourished, well developed, moderately tachypneic and using pursed lip breathing HEENT: normal Lymph: no adenopathy Neck: no JVD Endocrine:  No thryomegaly Vascular: No carotid bruits; FA pulses 2+ bilaterally without bruits  Cardiac: Distant S1, S2; irregular rhythm; no murmur  Lungs: Diminished breath sounds throughout, faint wheezing Abd: soft, nontender, no hepatomegaly  Ext: no edema, skin wrinkling is evidence of previous edema Musculoskeletal:  No deformities, BUE and BLE strength normal and equal Skin: warm and dry  Neuro:  CNs 2-12 intact, no focal abnormalities noted Psych:  Normal affect   EKG:  The EKG was personally reviewed and demonstrates: Atrial fibrillation with controlled ventricular response and frequent PVCs/aberrancy, specific intraventricular conduction delay with left axis deviation, no ischemic repolarization abnormalities.  QTc around 510 ms.  Tracing similar with that from 10/12/2017.  Telemetry:  Telemetry was personally reviewed and demonstrates: Atrial fibrillation, PVCs  Relevant CV Studies: Echo June 15, 2017 Study Conclusions  - Left ventricle: The cavity size was normal. Wall thickness was   normal. Systolic function was normal. The estimated ejection   fraction was 55%. Wall motion was normal; there were no regional   wall motion abnormalities. The  study is not technically   sufficient to allow evaluation of LV diastolic function. - Mitral valve: There was mild regurgitation. - Left atrium: The atrium was mildly dilated. - Right atrium: The atrium was severely dilated. - Atrial septum: No defect or patent foramen ovale was identified. - Tricuspid valve: There was severe regurgitation. - Pulmonary arteries: PA peak pressure: 72 mm Hg (S).  PFTs 2016  Ref Range & Units 30yrago  FVC-Pre L 1.47   FVC-%Pred-Pre % 67   FVC-Post L 1.59   FVC-%Pred-Post %  73   FVC-%Change-Post % 7   FEV1-Pre L 0.74   FEV1-%Pred-Pre % 46   FEV1-Post L 0.87   FEV1-%Pred-Post % 54   FEV1-%Change-Post % 18   FEV6-Pre L 1.43   FEV6-%Pred-Pre % 70   FEV6-Post L 1.57   FEV6-%Pred-Post % 77   FEV6-%Change-Post % 10   Pre FEV1/FVC ratio % 50   FEV1FVC-%Pred-Pre % 69   Post FEV1/FVC ratio % 55   FEV1FVC-%Change-Post % 9   Pre FEV6/FVC Ratio % 97   FEV6FVC-%Pred-Pre % 104   Post FEV6/FVC ratio % 99   FEV6FVC-%Pred-Post % 106   FEV6FVC-%Change-Post % 1   FEF 25-75 Pre L/sec 0.30   FEF2575-%Pred-Pre % 32   FEF 25-75 Post L/sec 0.44   FEF2575-%Pred-Post % 46   FEF2575-%Change-Post % 45   RV L 3.03   RV % pred % 119   TLC L 4.56   TLC % pred % 91   DLCO unc ml/min/mmHg 11.11   DLCO unc % pred % 47   DL/VA ml/min/mmHg/L 3.58   DL/VA % pred % 75     Laboratory Data: Pending  Radiology/Studies:  No results found.  Assessment and Plan:   1. AFib: Rate controlled.  On anticoagulants (prothrombin time pending).  Palpitations are probably due to frequent PVCs, in turn likely related to acute respiratory illness.  Continue current dose of metoprolol and warfarin anticoagulation.  Chronic arrhythmia, she would not benefit from cardioversion. CHADSvasc 5 (age 44, gender, HTN, CHF). 2. CHF: Clinically she appears to be euvolemic, her weight is within a couple of pounds of the weight at her office visit in May, she does not have any leg edema.  Do not  think she has heart failure exacerbation.  Though her previous echocardiogram has been described as showing diastolic dysfunction, this is not a diagnosis that can be readily made during atrial fibrillation.  I think the bulk of evidence suggests that she has only right heart failure. 3. PAH: she has been resistant to the suggestion of long-term oxygen therapy even though we have discussed this several times.  She reiterated that today to her daughter 37. Acute respiratory infection: This appears to be the most likely cause for her acute decompensation.  Chest x-ray pending. 5. Acute on chronic respiratory failure with hypoxia: Settles down, will try to talk to her again about chronic oxygen supplementation which I think she qualifies for.  I am skeptical that she will change her mind. 6. HTN: Generally well controlled, mild elevation can be attributed to acute distress.     For questions or updates, please contact [place] Please consult [email or web address] for contact info under     Signed, Sanda Klein, MD  11/18/2017 12:58 PM

## 2017-11-18 NOTE — ED Triage Notes (Signed)
EMS reported pt not feeling well last night with one episode of vomiting. She called RN today and said her heart was flipping around and had SOB. Also reported drainage/cough x2days.  98% on 3lpm Given 20m Zofran total

## 2017-11-19 ENCOUNTER — Ambulatory Visit (HOSPITAL_COMMUNITY): Admission: RE | Admit: 2017-11-19 | Payer: Medicare Other | Source: Ambulatory Visit | Admitting: Cardiovascular Disease

## 2017-11-19 ENCOUNTER — Inpatient Hospital Stay (HOSPITAL_COMMUNITY): Payer: Medicare Other

## 2017-11-19 ENCOUNTER — Encounter (HOSPITAL_COMMUNITY): Admission: RE | Payer: Self-pay | Source: Ambulatory Visit

## 2017-11-19 DIAGNOSIS — R05 Cough: Secondary | ICD-10-CM | POA: Diagnosis not present

## 2017-11-19 DIAGNOSIS — J9621 Acute and chronic respiratory failure with hypoxia: Secondary | ICD-10-CM | POA: Diagnosis not present

## 2017-11-19 DIAGNOSIS — J441 Chronic obstructive pulmonary disease with (acute) exacerbation: Secondary | ICD-10-CM | POA: Diagnosis not present

## 2017-11-19 DIAGNOSIS — J440 Chronic obstructive pulmonary disease with acute lower respiratory infection: Principal | ICD-10-CM

## 2017-11-19 DIAGNOSIS — I481 Persistent atrial fibrillation: Secondary | ICD-10-CM | POA: Diagnosis not present

## 2017-11-19 DIAGNOSIS — R0602 Shortness of breath: Secondary | ICD-10-CM | POA: Diagnosis not present

## 2017-11-19 DIAGNOSIS — I482 Chronic atrial fibrillation: Secondary | ICD-10-CM

## 2017-11-19 DIAGNOSIS — J209 Acute bronchitis, unspecified: Secondary | ICD-10-CM | POA: Diagnosis not present

## 2017-11-19 DIAGNOSIS — R0600 Dyspnea, unspecified: Secondary | ICD-10-CM | POA: Diagnosis not present

## 2017-11-19 DIAGNOSIS — J9601 Acute respiratory failure with hypoxia: Secondary | ICD-10-CM

## 2017-11-19 DIAGNOSIS — I5032 Chronic diastolic (congestive) heart failure: Secondary | ICD-10-CM | POA: Diagnosis not present

## 2017-11-19 LAB — CBC
HEMATOCRIT: 38.9 % (ref 36.0–46.0)
HEMOGLOBIN: 13.2 g/dL (ref 12.0–15.0)
MCH: 36.4 pg — ABNORMAL HIGH (ref 26.0–34.0)
MCHC: 33.9 g/dL (ref 30.0–36.0)
MCV: 107.2 fL — AB (ref 78.0–100.0)
Platelets: 150 10*3/uL (ref 150–400)
RBC: 3.63 MIL/uL — ABNORMAL LOW (ref 3.87–5.11)
RDW: 13.5 % (ref 11.5–15.5)
WBC: 15.5 10*3/uL — ABNORMAL HIGH (ref 4.0–10.5)

## 2017-11-19 LAB — BASIC METABOLIC PANEL
ANION GAP: 14 (ref 5–15)
BUN: 22 mg/dL (ref 8–23)
CHLORIDE: 96 mmol/L — AB (ref 98–111)
CO2: 29 mmol/L (ref 22–32)
Calcium: 9.1 mg/dL (ref 8.9–10.3)
Creatinine, Ser: 1.12 mg/dL — ABNORMAL HIGH (ref 0.44–1.00)
GFR calc non Af Amer: 42 mL/min — ABNORMAL LOW (ref >60)
GFR, EST AFRICAN AMERICAN: 48 mL/min — AB (ref >60)
Glucose, Bld: 151 mg/dL — ABNORMAL HIGH (ref 70–99)
POTASSIUM: 3.2 mmol/L — AB (ref 3.5–5.1)
Sodium: 139 mmol/L (ref 135–145)

## 2017-11-19 LAB — PROTIME-INR
INR: 2
Prothrombin Time: 22.5 seconds — ABNORMAL HIGH (ref 11.4–15.2)

## 2017-11-19 LAB — HIV ANTIBODY (ROUTINE TESTING W REFLEX): HIV SCREEN 4TH GENERATION: NONREACTIVE

## 2017-11-19 SURGERY — ECHOCARDIOGRAM, TRANSESOPHAGEAL
Anesthesia: Monitor Anesthesia Care

## 2017-11-19 MED ORDER — IPRATROPIUM-ALBUTEROL 0.5-2.5 (3) MG/3ML IN SOLN
3.0000 mL | Freq: Three times a day (TID) | RESPIRATORY_TRACT | Status: DC
  Filled 2017-11-19: qty 3

## 2017-11-19 MED ORDER — ALBUTEROL SULFATE (2.5 MG/3ML) 0.083% IN NEBU: 2.5000 mg | INHALATION_SOLUTION | RESPIRATORY_TRACT | Status: DC | PRN

## 2017-11-19 MED ORDER — PREDNISONE 20 MG PO TABS: 20.0000 mg | ORAL_TABLET | Freq: Every day | ORAL | 0 refills | Status: AC

## 2017-11-19 MED ORDER — CEFPODOXIME PROXETIL 100 MG PO TABS: 100.0000 mg | ORAL_TABLET | Freq: Two times a day (BID) | ORAL | 0 refills | Status: AC

## 2017-11-19 MED ORDER — POTASSIUM CHLORIDE CRYS ER 20 MEQ PO TBCR
40.0000 meq | EXTENDED_RELEASE_TABLET | Freq: Once | ORAL | Status: AC
  Administered 2017-11-19: 40 meq via ORAL
  Filled 2017-11-19: qty 2

## 2017-11-19 MED ORDER — WARFARIN SODIUM 2 MG PO TABS: 2.0000 mg | ORAL_TABLET | Freq: Once | ORAL | Status: DC

## 2017-11-19 NOTE — Clinical Social Work Note (Signed)
CSW spoke with [facility] and at this time all of the SNF beds are filled. In addition, Butch Penny, with admissions states they have been following in the chart and they state pt does not seem SNF level of care. Butch Penny has offered daughter a bed in the Assisted living unit and daughter is agreeable.   ,[place], [place]

## 2017-11-19 NOTE — Progress Notes (Signed)
ANTICOAGULATION CONSULT NOTE - Consult  Pharmacy Consult for warfarin Indication: atrial fibrillation  Allergies  Allergen Reactions  . Codeine Nausea Only    Nausea     Patient Measurements: Height: _0  (162.6 cm) Weight: 120 lb 9.5 oz (54.7 kg) IBW/kg (Calculated) : 54.7  Vital Signs: Temp: 97.7 F (36.5 C) (09/20 0717) Temp Source: Oral (09/20 0717) BP: 106/66 (09/20 0717) Pulse Rate: 80 (09/20 0717)  Labs: Recent Labs    11/18/17 1259 11/19/17 0231  HGB 14.7 13.2  HCT 43.3 38.9  PLT 158 150  LABPROT 21.5* 22.5*  INR 1.89 2.00  CREATININE 1.00 1.12*    Estimated Creatinine Clearance: 28.3 mL/min (A) (by C-G formula based on SCr of 1.12 mg/dL (H)).  Assessment: CC/HPI: 84 yo f presenting with afib Patient on warfarin pta for afib. INR is just therapeutic today at 2.0. No bleeding noted.   PTA Dose 4 mg Thu, 2 mg AOD   Goal of Therapy:  INR 2-3 Monitor platelets by anticoagulation protocol: Yes   Plan:  Warfarin 2 mg x 1 Monitor daily INR, CBC, clinical course, s/sx of bleed, PO intake, DDI   Thank you for allowing [place] to participate in this patients care.   Jens Som, PharmD Please utilize Amion (under [place]) for appropriate number for your unit pharmacist. 11/19/2017 2:18 PM

## 2017-11-19 NOTE — Progress Notes (Signed)
PROGRESS NOTE    [name]  INO:[id number] DOB: [birth year] DOA: 11/18/2017 PCP: Shon Baton, MD  Brief Narrative: [name] is a 84 y.o. female with a past medical history significant for breast cancer (left; on remission), hypertension, chronic atrial fibrillation (chronically on Coumadin), COPD, pulmonary arterial hypertension, gout and anxiety; who presented to the emergency department secondary to 2 to 3 days of rhinorrhea, general malaise, productive cough and shortness of breath.  Patient reports that her symptoms even has been present for even longer periods of time than just 3 days they had gotten worse and she has escalated to require oxygen supplementation.    blood work demonstrated elevated WBCs, elevated BNP and her chest x-ray 8 demonstrated bronchitic changes.   Assessment & Plan:   1-acute hypoxic respiratory failure -acute component likely secondary to bronchitis with COPD exacerbation -multifactorial chronic respiratory failure from underlying COPD with severe pulmonary hypertension, last PA pressures were 72 -Admitted with URI type symptoms, productive cough malaise rhinorrhea and worsening hypoxia, chest x-ray noted bronchitic changes with no frank infiltrates she was noted to be wheezing in the emergency room yesterday -Clinically improving, not back to baseline yet still requiring 2 L oxygen -Continue IV ceftriaxone and azithromycin, repeat chest x-ray today, Influenza PCR is negative -Physical therapy evaluation -Attempt to wean oxygen -Prednisone taper  2-chronic atrial fibrillation:  -chronic atrial fibrillation, continue Coumadin and metoprolol -chads2vasc score>3  3. Severe pulmonary hypertension -Resume oral diuretics, assess need for home oxygen  4-Hypertension -overall stable and well controlled -will continue current antihypertensive regimen.  5-chronic diastolic HF -last Echo with preserved EF -clinically appears euvolemic, continue  oral Lasix  7-COPD with acute bronchitis ([place]) -above   8-prolonged QT -unchanged from EKG on 09/2017 -avoid medications that can pronlong QT further -monitor on telemetry -follow electrolytes.  9-anxiety -continue home dose of xanax.    DVT prophylaxis:continue Coumadin. Code Status: DNR Family Communication: no family at bedside Disposition Plan: back to ALF in 1-2 days if stable  Consultants:    Procedures:   Antimicrobials:    Subjective: -breathing better  Objective: Vitals:   11/18/17 2331 11/19/17 0700 11/19/17 0708 11/19/17 0717  BP: (!) 145/75   106/66  Pulse: 72   80  Resp: 19   18  Temp: 98.2 F (36.8 C)   97.7 F (36.5 C)  TempSrc: Oral   Oral  SpO2: 96% 97% 97% 99%  Weight:      Height:        Intake/Output Summary (Last 24 hours) at 11/19/2017 0941 Last data filed at 11/19/2017 0962 Gross per 24 hour  Intake 783.98 ml  Output 650 ml  Net 133.98 ml   Filed Weights   11/18/17 1252 11/18/17 1802  Weight: 56.7 kg 54.7 kg    Examination:  General exam: Appears calm and comfortable, frail elderly female Respiratory system: decreased BS at bases, bilateral expiratory wheezes Cardiovascular system: S1 & S2 heard, Irregular Gastrointestinal system: Abdomen is nondistended, soft and nontender.Normal bowel sounds heard. Central nervous system: Alert and oriented. No focal neurological deficits. Extremities: Symmetric 5 x 5 power. Skin: No rashes, lesions or ulcers Psychiatry: Judgement and insight appear normal. Mood & affect appropriate.     Data Reviewed:   CBC: Recent Labs  Lab 11/18/17 1259 11/19/17 0231  WBC 18.3* 15.5*  HGB 14.7 13.2  HCT 43.3 38.9  MCV 106.9* 107.2*  PLT 158 836   Basic Metabolic Panel: Recent Labs  Lab 11/18/17 1259 11/18/17 1832 11/19/17 0231  NA 136  --  139  K 3.7  --  3.2*  CL 97*  --  96*  CO2 23  --  29  GLUCOSE 149*  --  151*  BUN 22  --  22  CREATININE 1.00  --  1.12*  CALCIUM 9.5  --   9.1  MG  --  1.8  --   PHOS  --  3.2  --    GFR: Estimated Creatinine Clearance: 28.3 mL/min (A) (by C-G formula based on SCr of 1.12 mg/dL (H)). Liver Function Tests: Recent Labs  Lab 11/18/17 1259  AST 26  ALT 16  ALKPHOS 108  BILITOT 4.9*  PROT 7.0  ALBUMIN 3.7   No results for input(s): LIPASE, AMYLASE in the last 168 hours. No results for input(s): AMMONIA in the last 168 hours. Coagulation Profile: Recent Labs  Lab 11/18/17 1259 11/19/17 0231  INR 1.89 2.00   Cardiac Enzymes: No results for input(s): CKTOTAL, CKMB, CKMBINDEX, TROPONINI in the last 168 hours. BNP (last 3 results) No results for input(s): PROBNP in the last 8760 hours. HbA1C: No results for input(s): HGBA1C in the last 72 hours. CBG: No results for input(s): GLUCAP in the last 168 hours. Lipid Profile: No results for input(s): CHOL, HDL, LDLCALC, TRIG, CHOLHDL, LDLDIRECT in the last 72 hours. Thyroid Function Tests: Recent Labs    11/18/17 1832  TSH 1.602   Anemia Panel: No results for input(s): VITAMINB12, FOLATE, FERRITIN, TIBC, IRON, RETICCTPCT in the last 72 hours. Urine analysis:    Component Value Date/Time   COLORURINE AMBER (A) 11/18/2017 1349   APPEARANCEUR CLOUDY (A) 11/18/2017 1349   LABSPEC 1.011 11/18/2017 1349   PHURINE 6.0 11/18/2017 1349   GLUCOSEU NEGATIVE 11/18/2017 1349   HGBUR MODERATE (A) 11/18/2017 1349   BILIRUBINUR NEGATIVE 11/18/2017 1349   KETONESUR NEGATIVE 11/18/2017 1349   PROTEINUR 100 (A) 11/18/2017 1349   NITRITE NEGATIVE 11/18/2017 1349   LEUKOCYTESUR NEGATIVE 11/18/2017 1349   Sepsis Labs: _0 (procalcitonin:4,lacticidven:4)  ) Recent Results (from the past 240 hour(s))  Culture, blood (routine x 2) Call MD if unable to obtain prior to antibiotics being given     Status: None (Preliminary result)   Collection Time: 11/18/17  6:25 PM  Result Value Ref Range Status   Specimen Description BLOOD LEFT ANTECUBITAL  Final   Special Requests    Final    BOTTLES DRAWN AEROBIC ONLY Blood Culture adequate volume   Culture   Final    NO GROWTH < 12 HOURS Performed at [facility], [place] [address] [place], [place] [address]    Report Status PENDING  Incomplete  Culture, blood (routine x 2) Call MD if unable to obtain prior to antibiotics being given     Status: None (Preliminary result)   Collection Time: 11/18/17  6:30 PM  Result Value Ref Range Status   Specimen Description BLOOD LEFT ANTECUBITAL  Final   Special Requests   Final    BOTTLES DRAWN AEROBIC ONLY Blood Culture adequate volume   Culture   Final    NO GROWTH < 12 HOURS Performed at [facility], [address], [place], [place] [address]    Report Status PENDING  Incomplete         Radiology Studies: Dg Chest 2 View  Result Date: 11/18/2017 CLINICAL DATA:  Cough, atrial fibrillation with rapid ventricular response. EXAM: CHEST - 2 VIEW COMPARISON:  PA and lateral chest x-ray of June 15, 2016 FINDINGS: The  lungs are hyperinflated with hemidiaphragm flattening. There is no focal infiltrate. The heart is top-normal in size. The pulmonary vascularity is normal. There is calcification in the wall of the aortic arch. There surgical clips in the left axillary region. The bony thorax exhibits no acute abnormality. IMPRESSION: Chronic bronchitic-smoking related changes.  No pneumonia nor CHF. Thoracic aortic atherosclerosis. Electronically Signed   By: David  Martinique M.D.   On: 11/18/2017 13:29        Scheduled Meds: . allopurinol  100 mg Oral QHS  . ALPRAZolam  0.5 mg Oral QHS  . azithromycin  500 mg Oral Q24H  . budesonide (PULMICORT) nebulizer solution  0.5 mg Nebulization BID  . dextromethorphan-guaiFENesin  1 tablet Oral BID  . fluticasone  1 spray Each Nare Daily  . furosemide  80 mg Oral Daily  . Influenza vac split quadrivalent PF  0.5 mL Intramuscular Tomorrow-1000  . loratadine  10 mg Oral Daily  . metoprolol succinate  50 mg Oral Daily  .  predniSONE  20 mg Oral Q breakfast  . sodium chloride flush  3 mL Intravenous Q12H  . Warfarin - Pharmacist Dosing Inpatient   Does not apply q1800   Continuous Infusions: . sodium chloride 10 mL/hr at 11/18/17 1443  . sodium chloride    . cefTRIAXone (ROCEPHIN)  IV       LOS: 1 day    Time spent: 5mn    PDomenic Polite MD [facility] Page via [email or web address], password [email or web address] After 7PM please contact night-coverage  11/19/2017, 9:41 AM

## 2017-11-19 NOTE — Discharge Summary (Signed)
Physician Discharge Summary  [name] OHY:[id number] DOB: [birth year] DOA: 11/18/2017  PCP: Shon Baton, MD  Admit date: 11/18/2017 Discharge date: 11/19/2017  Time spent: 35 minutes  Recommendations for Outpatient Follow-up:  1. PCP in 1 week 2. Cardiology Dr.Croitoru in 1 month   Discharge Diagnoses:  Principal Problem:   Acute respiratory failure with hypoxia ([place])   Bronchitis with COPD exacerbation   Chronic atrial fibrillation (HCC)   Chronic anticoagulation   Hypertension   PAH (pulmonary artery hypertension) ([place])   COPD with acute bronchitis ([place])   Discharge Condition: stable  Diet recommendation: heart healthy  Filed Weights   11/18/17 1252 11/18/17 1802  Weight: 56.7 kg 54.7 kg    History of present illness:  [name] a 84 y.o.femalewith a past medical history significant for breast cancer (left; on remission), hypertension, chronic atrial fibrillation (chronically on Coumadin), COPD, pulmonary arterial hypertension, goutand anxiety; who presented to the emergency department secondary to 2 to 3 days of rhinorrhea, general malaise, productive cough and shortness of breath. Patient reports that her symptoms even has been present for even longer periods of time than just 3 days they had gotten worse and she has escalated to require oxygen supplementation.  blood work demonstrated elevated WBCs, elevated BNP and her chest x-ray 8 demonstrated bronchitic changes  Hospital Course:    1-Acute hypoxic respiratory failure -acute component likely secondary to bronchitis with COPD exacerbation -multifactorial chronic respiratory failure from underlying COPD with severe pulmonary hypertension, last PA pressures were 72 -Admitted with URI type symptoms, productive cough malaise rhinorrhea and worsening hypoxia, chest x-ray noted bronchitic changes with no frank infiltrates she was noted to be wheezing in the emergency room yesterday -Clinically  improving, treated with IV ceftriaxone and azithromycin, prednisone, Influenza PCR is negative -weaned off O2, no further wheezing, continue prednisone for 2 more days - ambulatory O2 sats evaluated and stayeda above 93% with activity/ambulation -.discharged to SN[facility]   2-chronic atrial fibrillation:  -chronic atrial fibrillation, continue Coumadin and metoprolol -chads2vasc score>3  3. Severe pulmonary hypertension -Resumed oral lasix, assess need for home oxygen  4-Hypertension -overall stable and well controlled -will continue current antihypertensive regimen.  5-chronic diastolic HF -last Echo with preserved EF -clinically appears euvolemic, continue oral Lasix  7-COPD with acute bronchitis ([place]) -above  8-prolonged QT -unchanged from EKG on 09/2017 -avoid QT prolonging meds  9-anxiety -continue home dose of xanax.  Discharge Exam: Vitals:   11/19/17 0708 11/19/17 0717  BP:  106/66  Pulse:  80  Resp:  18  Temp:  97.7 F (36.5 C)  SpO2: 97% 99%    General: AAOx3 Cardiovascular: S1S2/RRR Respiratory: improved airway  Discharge Instructions   Discharge Instructions    Diet - low sodium heart healthy   Complete by:  As directed    Increase activity slowly   Complete by:  As directed      Allergies as of 11/19/2017      Reactions   Codeine Nausea Only   Nausea      Medication List    TAKE these medications   acetaminophen 500 MG tablet Commonly known as:  TYLENOL Take 1,000 mg by mouth at bedtime.   allopurinol 100 MG tablet Commonly known as:  ZYLOPRIM Take 100 mg by mouth at bedtime.   ALPRAZolam 0.5 MG tablet Commonly known as:  XANAX Take 0.5 mg by mouth at bedtime.   cefpodoxime 100 MG tablet Commonly known as:  VANTIN Take 1 tablet (100 mg total) by  mouth 2 (two) times daily for 5 days.   furosemide 40 MG tablet Commonly known as:  LASIX TAKE 2 TABLETS BY MOUTH ONCE DAILY.   metoprolol succinate 50 MG 24 hr  tablet Commonly known as:  TOPROL-XL Take 1 tablet (50 mg total) by mouth daily.   potassium chloride 10 MEQ tablet Commonly known as:  K-DUR Take 1 tablet (10 mEq total) by mouth daily.   predniSONE 20 MG tablet Commonly known as:  DELTASONE Take 1 tablet (20 mg total) by mouth daily with breakfast for 2 doses. Start taking on:  11/20/2017   warfarin 2 MG tablet Commonly known as:  COUMADIN Take 2-4 mg by mouth daily. Pt takes one tablet daily (2 mg) daily except for on Thursday pt takes two tablets (4 mg)      Allergies  Allergen Reactions  . Codeine Nausea Only    Nausea    Follow-up Information    Croitoru, Mihai, MD Follow up.   Specialty:  Cardiology Why:  See appointment below - 11/15 Contact information: [address] [place] [address] [phone]        Shon Baton, MD. Schedule an appointment as soon as possible for a visit in 1 week(s).   Specialty:  Internal Medicine Contact information: [place] [address] [phone]            The results of significant diagnostics from this hospitalization (including imaging, microbiology, ancillary and laboratory) are listed below for reference.    Significant Diagnostic Studies: Dg Chest 2 View  Result Date: 11/19/2017 CLINICAL DATA:  Productive cough and shortness of breath for 3 days EXAM: CHEST - 2 VIEW COMPARISON:  11/18/2017 FINDINGS: Cardiac shadow is again mildly enlarged. Aortic calcifications are again seen. The lungs are hyperaerated consistent with COPD. No focal infiltrate or sizable effusion is noted. No acute bony abnormality is seen. IMPRESSION: COPD without acute abnormality. Electronically Signed   By: Inez Catalina M.D.   On: 11/19/2017 11:11   Dg Chest 2 View  Result Date: 11/18/2017 CLINICAL DATA:  Cough, atrial fibrillation with rapid ventricular response. EXAM: CHEST - 2 VIEW COMPARISON:  PA and lateral chest x-ray of June 15, 2016 FINDINGS: The  lungs are hyperinflated with hemidiaphragm flattening. There is no focal infiltrate. The heart is top-normal in size. The pulmonary vascularity is normal. There is calcification in the wall of the aortic arch. There surgical clips in the left axillary region. The bony thorax exhibits no acute abnormality. IMPRESSION: Chronic bronchitic-smoking related changes.  No pneumonia nor CHF. Thoracic aortic atherosclerosis. Electronically Signed   By: David  Martinique M.D.   On: 11/18/2017 13:29    Microbiology: Recent Results (from the past 240 hour(s))  Culture, blood (routine x 2) Call MD if unable to obtain prior to antibiotics being given     Status: None (Preliminary result)   Collection Time: 11/18/17  6:25 PM  Result Value Ref Range Status   Specimen Description BLOOD LEFT ANTECUBITAL  Final   Special Requests   Final    BOTTLES DRAWN AEROBIC ONLY Blood Culture adequate volume   Culture   Final    NO GROWTH < 12 HOURS Performed at [facility], [address] [place], [place] [address]    Report Status PENDING  Incomplete  Culture, blood (routine x 2) Call MD if unable to obtain prior to antibiotics being given     Status: None (Preliminary result)   Collection Time: 11/18/17  6:30 PM  Result Value Ref Range Status   Specimen Description BLOOD LEFT ANTECUBITAL  Final   Special Requests   Final    BOTTLES DRAWN AEROBIC ONLY Blood Culture adequate volume   Culture   Final    NO GROWTH < 12 HOURS Performed at [facility], [address], [place], [place] [address]    Report Status PENDING  Incomplete     Labs: Basic Metabolic Panel: Recent Labs  Lab 11/18/17 1259 11/18/17 1832 11/19/17 0231  NA 136  --  139  K 3.7  --  3.2*  CL 97*  --  96*  CO2 23  --  29  GLUCOSE 149*  --  151*  BUN 22  --  22  CREATININE 1.00  --  1.12*  CALCIUM 9.5  --  9.1  MG  --  1.8  --   PHOS  --  3.2  --    Liver Function Tests: Recent Labs  Lab 11/18/17 1259  AST 26  ALT 16   ALKPHOS 108  BILITOT 4.9*  PROT 7.0  ALBUMIN 3.7   No results for input(s): LIPASE, AMYLASE in the last 168 hours. No results for input(s): AMMONIA in the last 168 hours. CBC: Recent Labs  Lab 11/18/17 1259 11/19/17 0231  WBC 18.3* 15.5*  HGB 14.7 13.2  HCT 43.3 38.9  MCV 106.9* 107.2*  PLT 158 150   Cardiac Enzymes: No results for input(s): CKTOTAL, CKMB, CKMBINDEX, TROPONINI in the last 168 hours. BNP: BNP (last 3 results) Recent Labs    11/18/17 1259  BNP 902.1*    ProBNP (last 3 results) No results for input(s): PROBNP in the last 8760 hours.  CBG: No results for input(s): GLUCAP in the last 168 hours.     Signed:  Domenic Polite MD.  [facility] 11/19/2017, 2:22 PM

## 2017-11-19 NOTE — Progress Notes (Signed)
SATURATION QUALIFICATIONS: (This note is used to comply with regulatory documentation for home oxygen)  Patient Saturations on Room Air at Rest = 97%  Patient Saturations on Room Air while Ambulating = 93%  Patient Saturations on 2 Liters of oxygen while Ambulating = 96%  Please briefly explain why patient needs home oxygen:  Well tolerated. No shortness of breath, dizziness, lightheadedness, etc.  Kinnie Scales, RN 11/19/17 1400

## 2017-11-19 NOTE — Consult Note (Signed)
[facility] CM Primary Care Navigator  11/19/2017  [name] October 18, 1924 [id number]   Went to seepatient at the bedside to identify possible discharge needs. Patient reportshaving "shortness of breath, coughing, vomiting and irregular heartbeats" that had led to this admission. (acute hypoxic respiratory failure, acute bronchitis with COPD exacerbation)  Patient reports being a resident at [facility] for 10 years (Independent living facility).  Patient endorsesDr.John Virgina Jock with [facility] as the primary care provider.   [facility] [address] [facility].   Patientstatesmanagingher ownmedications straight out of the containers.  Patient reportsthatshe uses facility transportation to go to her doctors'appointments.  She verbalizedbeing independent with self care prior to admission but her daughter [name]- works at a [employer] office) assists with her needs at home (facility). Patient mentioned that she can also call for staff/ nurse to assist her if needed.  Anticipated discharge planis to return back to [facility] but at the ALF- assisted living level of care.   Patientexpressedunderstanding to call primary care provider's office for a post discharge follow-up appointment within 1- 2 weeks or sooner if needs arise.Patient letter (with PCP's contact number) was provided as a reminder.  Per MD note, patient with multifactorial chronic respiratory failure from underlying COPD with severe pulmonary hypertension. Chest x-ray showed bronchitic changes, with noted wheezing treated with IV antibiotics and prednisone.  Explained to patient about [facility] CM services available for health management at home but patient verbalizedbeing able to manage her health needs so far. Patient reports that daughter is well informed and knowledgeable in ways to manage patient's health needs, will  provide her assistance in the facility and will monitor her at discharge.  Although patient had indicatedsome interest forservice, she had only opted and verbally agreed with phone calls ([facility] COPD calls) to follow-up her recovery at home.  [facility] was [facility] for [facility] COPDcalls after discharge.  Patientexpressed understanding to seekreferral to [facility] care management from primary care provider if deemed necessary for further servicesin the future.   [facility] care management information provided for future needs that may arise.    For additional questions please contact:  Edwena Felty A. Jequan Shahin, BSN, RN-BC [facility] PRIMARY CARE Navigator Cell: [phone]

## 2017-11-19 NOTE — NC FL2 (Addendum)
[place] LEVEL OF CARE SCREENING TOOL     IDENTIFICATION  Patient Name: [name] Birthdate: 1925-01-06 Sex: female Admission Date (Current Location): 11/18/2017  [facility] and [place] Number:  Herbalist and Address:  [facility], [place] [address] [place], [place] [address]      Provider Number: [id number]  Attending Physician Name and Address:  Domenic Polite, MD  Relative Name and Phone Number:       Current Level of Care: Hospital Recommended Level of Care: Assisted Living Prior Approval Number:    Date Approved/Denied:   PASRR Number: [id number]  Discharge Plan: Assisted Living   Current Diagnoses: Patient Active Problem List   Diagnosis Date Noted  . Acute respiratory failure with hypoxia ([place]) 11/18/2017  . PAH (pulmonary artery hypertension) ([place]) 03/25/2017  . COPD with acute bronchitis ([place]) 03/25/2017  . Hypertension   . Breast cancer ([place])   . Constipation 07/10/2016  . Multiple closed fractures of pelvis with stable disruption of pelvic circle ([place]) 06/16/2016  . Closed nondisplaced fracture of fifth metacarpal bone of left hand 06/16/2016  . Pelvic fracture ([place]) 05/31/2016  . Dyspnea on exertion 11/06/2014  . Orthostasis 11/06/2014  . Chest tightness 06/13/2013  . Essential hypertension 06/13/2013  . Cor pulmonale, chronic ([place]) 06/13/2013  . Chronic atrial fibrillation ([place]) 06/13/2013  . Chronic anticoagulation 06/13/2013    Orientation RESPIRATION BLADDER Height & Weight     Self, Situation, Place, Time  O2(Nasal cannula 2L) External catheter, Incontinent(placed 9/19) Weight: 120 lb 9.5 oz (54.7 kg) Height:  _0  (162.6 cm)  BEHAVIORAL SYMPTOMS/MOOD NEUROLOGICAL BOWEL NUTRITION STATUS      Continent Diet(Heart healthy, thin liquids)  AMBULATORY STATUS COMMUNICATION OF NEEDS Skin   Limited Assist Verbally Normal                       Personal Care Assistance Level of Assistance   Bathing, Feeding, Dressing Bathing Assistance: Limited assistance Feeding assistance: Independent Dressing Assistance: Limited assistance     Functional Limitations Info  Speech, Hearing, Sight Sight Info: Adequate Hearing Info: Adequate Speech Info: Adequate    SPECIAL CARE FACTORS FREQUENCY  PT (By licensed PT), OT (By licensed OT)     PT Frequency: 3x OT Frequency: 3x            Contractures Contractures Info: Not present    Additional Factors Info  Code Status, Allergies Code Status Info: DNR Allergies Info: Codeine           Current Medications (11/19/2017):  This is the current hospital active medication list Current Facility-Administered Medications  Medication Dose Route Frequency Provider Last Rate Last Dose  . 0.9 %  sodium chloride infusion   Intravenous PRN Barton Dubois, MD 10 mL/hr at 11/18/17 1443    . 0.9 %  sodium chloride infusion  250 mL Intravenous PRN Barton Dubois, MD      . acetaminophen (TYLENOL) tablet 650 mg  650 mg Oral Q6H PRN Barton Dubois, MD      . albuterol (PROVENTIL) (2.5 MG/3ML) 0.083% nebulizer solution 2.5 mg  2.5 mg Nebulization Q4H PRN Shon Baton, MD      . allopurinol (ZYLOPRIM) tablet 100 mg  100 mg Oral QHS Barton Dubois, MD   100 mg at 11/18/17 2016  . ALPRAZolam Duanne Moron) tablet 0.5 mg  0.5 mg Oral QHS Barton Dubois, MD   0.5 mg at 11/18/17 2017  . azithromycin (ZITHROMAX) tablet 500  mg  500 mg Oral Q24H Barton Dubois, MD   500 mg at 11/18/17 1856  . budesonide (PULMICORT) nebulizer solution 0.5 mg  0.5 mg Nebulization BID Barton Dubois, MD   0.5 mg at 11/19/17 0705  . cefTRIAXone (ROCEPHIN) 1 g in sodium chloride 0.9 % 100 mL IVPB  1 g Intravenous Q24H Barton Dubois, MD      . dextromethorphan-guaiFENesin [facility] DM) 30-600 MG per 12 hr tablet 1 tablet  1 tablet Oral BID Barton Dubois, MD   1 tablet at 11/19/17 [phone]  . fluticasone (FLONASE) 50 MCG/ACT nasal spray 1 spray  1 spray Each Nare Daily Barton Dubois, MD       . furosemide (LASIX) tablet 80 mg  80 mg Oral Daily Barton Dubois, MD   80 mg at 11/19/17 6599  . guaiFENesin-dextromethorphan (ROBITUSSIN DM) 100-10 MG/5ML syrup 5 mL  5 mL Oral Q4H PRN Barton Dubois, MD   5 mL at 11/18/17 2016  . Influenza vac split quadrivalent PF (FLUZONE HIGH-DOSE) injection 0.5 mL  0.5 mL Intramuscular Tomorrow-1000 Barton Dubois, MD      . loratadine (CLARITIN) tablet 10 mg  10 mg Oral Daily Barton Dubois, MD   10 mg at 11/19/17 0904  . metoprolol succinate (TOPROL-XL) 24 hr tablet 50 mg  50 mg Oral Daily Barton Dubois, MD   50 mg at 11/19/17 0904  . ondansetron (ZOFRAN) tablet 4 mg  4 mg Oral Q6H PRN Barton Dubois, MD       Or  . ondansetron [facility]) injection 4 mg  4 mg Intravenous Q6H PRN Barton Dubois, MD   4 mg at 11/18/17 2020  . predniSONE (DELTASONE) tablet 20 mg  20 mg Oral Q breakfast Barton Dubois, MD   20 mg at 11/19/17 3570  . sodium chloride flush (NS) 0.9 % injection 3 mL  3 mL Intravenous Q12H Barton Dubois, MD   3 mL at 11/19/17 0906  . sodium chloride flush (NS) 0.9 % injection 3 mL  3 mL Intravenous PRN Barton Dubois, MD      . Warfarin - Pharmacist Dosing Inpatient   Does not apply q1800 Wynell Balloon, [facility]         Discharge Medications: Please see discharge summary for a list of discharge medications.  Relevant Imaging Results:  Relevant Lab Results:   Additional Information SSN: [id number]  Eileen Stanford, LCSW

## 2017-11-19 NOTE — Evaluation (Signed)
Physical Therapy Evaluation Patient Details Name: [name] MRN: [id number] DOB: December 16, 1924 Today's Date: 11/19/2017   History of Present Illness  Pt is a 84 y.o. female admitted 11/18/17 with malaise, productive cough and SOB; worked up for acute hypoxic respiratory failure likely secondary to bronchitis with COPD exacerbation. PMH includes HTN, a-fib, COPD, HTN, gout, anxiety, breast CA.    Clinical Impression  Patient evaluated by Physical Therapy with no further acute PT needs identified. PTA, pt lives at [place]; mod indep with rollator. Today, pt able to ambulate with RW and complete ADLs at supervision-level. All education has been completed and the patient has no further questions. Acute PT is signing off. Thank you for this referral.    Follow Up Recommendations Supervision for mobility/OOB;Other (comment)([facility])    Equipment Recommendations  None recommended by PT    Recommendations for Other Services       Precautions / Restrictions Precautions Precautions: Fall Restrictions Weight Bearing Restrictions: No      Mobility  Bed Mobility               General bed mobility comments: Received sitting in recliner  Transfers Overall transfer level: Needs assistance Equipment used: Rolling walker (2 wheeled) Transfers: Sit to/from Stand Sit to Stand: Supervision            Ambulation/Gait Ambulation/Gait assistance: Supervision Gait Distance (Feet): 100 Feet Assistive device: Rolling walker (2 wheeled) Gait Pattern/deviations: Step-through pattern;Decreased stride length Gait velocity: Decreased Gait velocity interpretation: 1.31 - 2.62 ft/sec, indicative of limited community ambulator General Gait Details: Slow, steady amb with RW; supervision for balance. SpO2 91% on RA  Stairs            Wheelchair Mobility    Modified Rankin (Stroke Patients Only)       Balance Overall balance assessment: Needs assistance   Sitting  balance-Leahy Scale: Good Sitting balance - Comments: Indep to don socks sitting in chair. Indep with pericare sitting on toilet     Standing balance-Leahy Scale: Fair Standing balance comment: Can static stand at sink to wash hands without UE support                             Pertinent Vitals/Pain Pain Assessment: No/denies pain    Home Living Family/patient expects to be discharged to:: Assisted living                 Additional Comments: Pt from apartment at [place]. All one level and accessible    Prior Function Level of Independence: Independent with assistive device(s)         Comments: Mod indep with rollator. Indep with ADLs. Meals provided; housekeeping provided. Pt does own laundry     Hand Dominance        Extremity/Trunk Assessment   Upper Extremity Assessment Upper Extremity Assessment: Overall WFL for tasks assessed    Lower Extremity Assessment Lower Extremity Assessment: Generalized weakness       Communication   Communication: HOH  Cognition Arousal/Alertness: Awake/alert Behavior During Therapy: WFL for tasks assessed/performed Overall Cognitive Status: Within Functional Limits for tasks assessed                                 General Comments: Age-appropriate cognition      General Comments      Exercises     Assessment/Plan  PT Assessment All further PT needs can be met in the next venue of care  PT Problem List Decreased strength;Decreased activity tolerance;Decreased balance;Decreased mobility;Cardiopulmonary status limiting activity       PT Treatment Interventions DME instruction;Gait training;Stair training;Functional mobility training;Therapeutic activities;Therapeutic exercise;Balance training;Patient/family education    PT Goals (Current goals can be found in the Care Plan section)  Acute Rehab PT Goals Patient Stated Goal: Return to rehab side of [facility] today  PT Goal  Formulation: With patient Time For Goal Achievement: 12/03/17 Potential to Achieve Goals: Good    Frequency Min 3X/week   Barriers to discharge        Co-evaluation               AM-PAC PT "6 Clicks" Daily Activity  Outcome Measure Difficulty turning over in bed (including adjusting bedclothes, sheets and blankets)?: A Little Difficulty moving from lying on back to sitting on the side of the bed? : A Little Difficulty sitting down on and standing up from a chair with arms (e.g., wheelchair, bedside commode, etc,.)?: A Little Help needed moving to and from a bed to chair (including a wheelchair)?: A Little Help needed walking in hospital room?: A Little Help needed climbing 3-5 steps with a railing? : A Little 6 Click Score: 18    End of Session Equipment Utilized During Treatment: Gait belt Activity Tolerance: Patient tolerated treatment well Patient left: in chair;with call bell/phone within reach;with chair alarm set Nurse Communication: Mobility status PT Visit Diagnosis: Other abnormalities of gait and mobility (R26.89)    Time: 4196-2229 PT Time Calculation (min) (ACUTE ONLY): 18 min   Charges:   PT Evaluation $PT Eval Moderate Complexity: [place], PT, DPT Acute Rehabilitation Services  Pager [phone] Office [place] 11/19/2017, 2:36 PM

## 2017-11-19 NOTE — Progress Notes (Addendum)
Progress Note  Patient Name: [name] Date of Encounter: 11/19/2017  Primary Cardiologist: Sanda Klein, MD  Subjective   Feeling remarkably better. No CP, SOB, palpitations, wheezing.  Inpatient Medications    Scheduled Meds: . allopurinol  100 mg Oral QHS  . ALPRAZolam  0.5 mg Oral QHS  . azithromycin  500 mg Oral Q24H  . budesonide (PULMICORT) nebulizer solution  0.5 mg Nebulization BID  . dextromethorphan-guaiFENesin  1 tablet Oral BID  . fluticasone  1 spray Each Nare Daily  . furosemide  80 mg Oral Daily  . Influenza vac split quadrivalent PF  0.5 mL Intramuscular Tomorrow-1000  . loratadine  10 mg Oral Daily  . metoprolol succinate  50 mg Oral Daily  . predniSONE  20 mg Oral Q breakfast  . sodium chloride flush  3 mL Intravenous Q12H  . Warfarin - Pharmacist Dosing Inpatient   Does not apply q1800   Continuous Infusions: . sodium chloride 10 mL/hr at 11/18/17 1443  . sodium chloride    . cefTRIAXone (ROCEPHIN)  IV     PRN Meds: sodium chloride, sodium chloride, acetaminophen, albuterol, guaiFENesin-dextromethorphan, ondansetron **OR** ondansetron (ZOFRAN) IV, sodium chloride flush   Vital Signs    Vitals:   11/18/17 2331 11/19/17 0700 11/19/17 0708 11/19/17 0717  BP: (!) 145/75   106/66  Pulse: 72   80  Resp: 19   18  Temp: 98.2 F (36.8 C)   97.7 F (36.5 C)  TempSrc: Oral   Oral  SpO2: 96% 97% 97% 99%  Weight:      Height:        Intake/Output Summary (Last 24 hours) at 11/19/2017 1301 Last data filed at 11/19/2017 0905 Gross per 24 hour  Intake 783.98 ml  Output 650 ml  Net 133.98 ml   Filed Weights   11/18/17 1252 11/18/17 1802  Weight: 56.7 kg 54.7 kg    Telemetry    Atrial fib HR 80s currently - Personally Reviewed  Physical Exam   GEN: No acute distress.  HEENT: Normocephalic, atraumatic, sclera non-icteric. Neck: No JVD or bruits. Cardiac: RRR no murmurs, rubs, or gallops.  Radials/DP/PT 1+ and equal bilaterally.    Respiratory: Diminished BS throughout. Quiet occasional wheezing.. Breathing is unlabored. GI: Soft, nontender, non-distended, BS +x 4. MS: no deformity. Extremities: No clubbing or cyanosis. No edema. Chronic venous stasis hyperpigmentation. Distal pedal pulses are 2+ and equal bilaterally. Neuro:  AAOx3. Follows commands. Psych:  Responds to questions appropriately with a normal affect.  Labs    Chemistry Recent Labs  Lab 11/18/17 1259 11/19/17 0231  NA 136 139  K 3.7 3.2*  CL 97* 96*  CO2 23 29  GLUCOSE 149* 151*  BUN 22 22  CREATININE 1.00 1.12*  CALCIUM 9.5 9.1  PROT 7.0  --   ALBUMIN 3.7  --   AST 26  --   ALT 16  --   ALKPHOS 108  --   BILITOT 4.9*  --   GFRNONAA 48* 42*  GFRAA 55* 48*  ANIONGAP 16* 14     Hematology Recent Labs  Lab 11/18/17 1259 11/19/17 0231  WBC 18.3* 15.5*  RBC 4.05 3.63*  HGB 14.7 13.2  HCT 43.3 38.9  MCV 106.9* 107.2*  MCH 36.3* 36.4*  MCHC 33.9 33.9  RDW 13.6 13.5  PLT 158 150    Cardiac EnzymesNo results for input(s): TROPONINI in the last 168 hours.  Recent Labs  Lab 11/18/17 1314  TROPIPOC 0.03  BNP Recent Labs  Lab 11/18/17 1259  BNP 902.1*     DDimer No results for input(s): DDIMER in the last 168 hours.   Radiology    Dg Chest 2 View  Result Date: 11/19/2017 CLINICAL DATA:  Productive cough and shortness of breath for 3 days EXAM: CHEST - 2 VIEW COMPARISON:  11/18/2017 FINDINGS: Cardiac shadow is again mildly enlarged. Aortic calcifications are again seen. The lungs are hyperaerated consistent with COPD. No focal infiltrate or sizable effusion is noted. No acute bony abnormality is seen. IMPRESSION: COPD without acute abnormality. Electronically Signed   By: Inez Catalina M.D.   On: 11/19/2017 11:11   Dg Chest 2 View  Result Date: 11/18/2017 CLINICAL DATA:  Cough, atrial fibrillation with rapid ventricular response. EXAM: CHEST - 2 VIEW COMPARISON:  PA and lateral chest x-ray of June 15, 2016 FINDINGS:  The lungs are hyperinflated with hemidiaphragm flattening. There is no focal infiltrate. The heart is top-normal in size. The pulmonary vascularity is normal. There is calcification in the wall of the aortic arch. There surgical clips in the left axillary region. The bony thorax exhibits no acute abnormality. IMPRESSION: Chronic bronchitic-smoking related changes.  No pneumonia nor CHF. Thoracic aortic atherosclerosis. Electronically Signed   By: David  Martinique M.D.   On: 11/18/2017 13:29    Patient Profile     84 y.o. female with long-term persistent atrial fibrillation and pulmonary artery hypertension secondary to combined restrictive and obstructive lung disease, breast CA, HTN was admitted with acute hypoxic respiratory failure likely felt secondary to bronchitis with URI symptoms.  Assessment & Plan    1. AECOPD - improving with measures taken by primary team.  2. Persistent atrial fib - as anticipated, HR improved with management of underlying respiratory disease. Continue current regimen. If wheezing worsens, consideration could be given to changing to bisoprolol although she presently feels on the upward trajectory.  3. PAH - patient has declined O2, no new recs.  4. Chronic diastolic CHF - appears euvolemic on home regimen.  5. Hypokalemia - being managed by primary team.  6. Renal insufficiency - baseline Cr appears 0.9-1.1, so suspect underlying CKD stage III - would continue to monitor with ongoing diuretic therapy.  [facility] will sign off.   Medication Recommendations:  As above Other recommendations (labs, testing, etc):  May need to consider potassium supplementation at discharge as dictated by repeat labs, recommend trending as outpatient with close primary care visit within 1 week of discharge Follow up as an outpatient:  Appt made with Dr. Sallyanne Kuster 11/15 at 8:40am, but as above would suggest she also see PCP for a transition of care visit post-COPD  exacerbation.   For questions or updates, please contact [place] Please consult [email or web address] for contact info under Cardiology/STEMI.  Signed, Charlie Pitter, PA-C 11/19/2017, 1:01 PM    I have seen and examined the patient along with Charlie Pitter, PA-C, PA NP.  I have reviewed the chart, notes and new data.  I agree with PA/NP's note.  Key new complaints: Substantially better Key examination changes: Diminished breath sounds throughout but no active wheezing, irregular heart rhythm, well rate controlled Key new findings / data: White blood cell count still elevated, but improved.  Potassium 3.2 being supplemented.  PLAN: No plans for inpatient cardiology work-up or change in therapy.  I would still encourage her to consider home oxygen supplementation, but she is reluctant.  Sanda Klein, MD, [place] [phone] 11/19/2017, 1:27 PM

## 2017-11-19 NOTE — Clinical Social Work Note (Addendum)
Clinical Social Worker facilitated patient discharge including contacting patient family and facility to confirm patient discharge plans.  Clinical information faxed to facility and family agreeable with plan.  [facility] will provide transportation for pt to get back to facility at 5. Pt will go to room 538 in Assisted Living .  RN to call [phone] for report prior to discharge.  Clinical Social Worker will sign off for now as social work intervention is no longer needed. Please consult [place] again if new need arises.  [place], [place]

## 2017-11-23 DIAGNOSIS — I482 Chronic atrial fibrillation: Secondary | ICD-10-CM | POA: Diagnosis not present

## 2017-11-23 DIAGNOSIS — I4891 Unspecified atrial fibrillation: Secondary | ICD-10-CM | POA: Diagnosis not present

## 2017-11-23 LAB — CULTURE, BLOOD (ROUTINE X 2)
Culture: NO GROWTH
Culture: NO GROWTH
SPECIAL REQUESTS: ADEQUATE
Special Requests: ADEQUATE

## 2017-11-24 DIAGNOSIS — I2723 Pulmonary hypertension due to lung diseases and hypoxia: Secondary | ICD-10-CM | POA: Diagnosis not present

## 2017-11-24 DIAGNOSIS — M6389 Disorders of muscle in diseases classified elsewhere, multiple sites: Secondary | ICD-10-CM | POA: Diagnosis not present

## 2017-11-24 DIAGNOSIS — R278 Other lack of coordination: Secondary | ICD-10-CM | POA: Diagnosis not present

## 2017-11-24 DIAGNOSIS — I5032 Chronic diastolic (congestive) heart failure: Secondary | ICD-10-CM | POA: Diagnosis not present

## 2017-11-24 DIAGNOSIS — R2689 Other abnormalities of gait and mobility: Secondary | ICD-10-CM | POA: Diagnosis not present

## 2017-11-24 DIAGNOSIS — R0602 Shortness of breath: Secondary | ICD-10-CM | POA: Diagnosis not present

## 2017-11-24 DIAGNOSIS — J9601 Acute respiratory failure with hypoxia: Secondary | ICD-10-CM | POA: Diagnosis not present

## 2017-11-24 DIAGNOSIS — J441 Chronic obstructive pulmonary disease with (acute) exacerbation: Secondary | ICD-10-CM | POA: Diagnosis not present

## 2017-11-24 DIAGNOSIS — M62562 Muscle wasting and atrophy, not elsewhere classified, left lower leg: Secondary | ICD-10-CM | POA: Diagnosis not present

## 2017-11-24 DIAGNOSIS — M62561 Muscle wasting and atrophy, not elsewhere classified, right lower leg: Secondary | ICD-10-CM | POA: Diagnosis not present

## 2017-11-25 DIAGNOSIS — M62562 Muscle wasting and atrophy, not elsewhere classified, left lower leg: Secondary | ICD-10-CM | POA: Diagnosis not present

## 2017-11-25 DIAGNOSIS — R2689 Other abnormalities of gait and mobility: Secondary | ICD-10-CM | POA: Diagnosis not present

## 2017-11-25 DIAGNOSIS — M62561 Muscle wasting and atrophy, not elsewhere classified, right lower leg: Secondary | ICD-10-CM | POA: Diagnosis not present

## 2017-11-25 DIAGNOSIS — R278 Other lack of coordination: Secondary | ICD-10-CM | POA: Diagnosis not present

## 2017-11-25 DIAGNOSIS — M6389 Disorders of muscle in diseases classified elsewhere, multiple sites: Secondary | ICD-10-CM | POA: Diagnosis not present

## 2017-11-25 DIAGNOSIS — R0602 Shortness of breath: Secondary | ICD-10-CM | POA: Diagnosis not present

## 2017-11-26 DIAGNOSIS — M62562 Muscle wasting and atrophy, not elsewhere classified, left lower leg: Secondary | ICD-10-CM | POA: Diagnosis not present

## 2017-11-26 DIAGNOSIS — R2689 Other abnormalities of gait and mobility: Secondary | ICD-10-CM | POA: Diagnosis not present

## 2017-11-26 DIAGNOSIS — M62561 Muscle wasting and atrophy, not elsewhere classified, right lower leg: Secondary | ICD-10-CM | POA: Diagnosis not present

## 2017-11-26 DIAGNOSIS — M6389 Disorders of muscle in diseases classified elsewhere, multiple sites: Secondary | ICD-10-CM | POA: Diagnosis not present

## 2017-11-26 DIAGNOSIS — R278 Other lack of coordination: Secondary | ICD-10-CM | POA: Diagnosis not present

## 2017-11-26 DIAGNOSIS — R0602 Shortness of breath: Secondary | ICD-10-CM | POA: Diagnosis not present

## 2017-11-29 DIAGNOSIS — M62561 Muscle wasting and atrophy, not elsewhere classified, right lower leg: Secondary | ICD-10-CM | POA: Diagnosis not present

## 2017-11-29 DIAGNOSIS — R2689 Other abnormalities of gait and mobility: Secondary | ICD-10-CM | POA: Diagnosis not present

## 2017-11-29 DIAGNOSIS — M6389 Disorders of muscle in diseases classified elsewhere, multiple sites: Secondary | ICD-10-CM | POA: Diagnosis not present

## 2017-11-29 DIAGNOSIS — M62562 Muscle wasting and atrophy, not elsewhere classified, left lower leg: Secondary | ICD-10-CM | POA: Diagnosis not present

## 2017-11-29 DIAGNOSIS — R278 Other lack of coordination: Secondary | ICD-10-CM | POA: Diagnosis not present

## 2017-11-29 DIAGNOSIS — R0602 Shortness of breath: Secondary | ICD-10-CM | POA: Diagnosis not present

## 2017-11-30 DIAGNOSIS — I5032 Chronic diastolic (congestive) heart failure: Secondary | ICD-10-CM | POA: Diagnosis not present

## 2017-11-30 DIAGNOSIS — I482 Chronic atrial fibrillation, unspecified: Secondary | ICD-10-CM | POA: Diagnosis not present

## 2017-11-30 DIAGNOSIS — J441 Chronic obstructive pulmonary disease with (acute) exacerbation: Secondary | ICD-10-CM | POA: Diagnosis not present

## 2017-11-30 DIAGNOSIS — M62561 Muscle wasting and atrophy, not elsewhere classified, right lower leg: Secondary | ICD-10-CM | POA: Diagnosis not present

## 2017-11-30 DIAGNOSIS — J9601 Acute respiratory failure with hypoxia: Secondary | ICD-10-CM | POA: Diagnosis not present

## 2017-11-30 DIAGNOSIS — I2723 Pulmonary hypertension due to lung diseases and hypoxia: Secondary | ICD-10-CM | POA: Diagnosis not present

## 2017-11-30 DIAGNOSIS — R278 Other lack of coordination: Secondary | ICD-10-CM | POA: Diagnosis not present

## 2017-11-30 DIAGNOSIS — R1312 Dysphagia, oropharyngeal phase: Secondary | ICD-10-CM | POA: Diagnosis not present

## 2017-11-30 DIAGNOSIS — M6389 Disorders of muscle in diseases classified elsewhere, multiple sites: Secondary | ICD-10-CM | POA: Diagnosis not present

## 2017-11-30 DIAGNOSIS — M62562 Muscle wasting and atrophy, not elsewhere classified, left lower leg: Secondary | ICD-10-CM | POA: Diagnosis not present

## 2017-11-30 DIAGNOSIS — R2689 Other abnormalities of gait and mobility: Secondary | ICD-10-CM | POA: Diagnosis not present

## 2017-11-30 DIAGNOSIS — R0602 Shortness of breath: Secondary | ICD-10-CM | POA: Diagnosis not present

## 2017-12-01 DIAGNOSIS — M62561 Muscle wasting and atrophy, not elsewhere classified, right lower leg: Secondary | ICD-10-CM | POA: Diagnosis not present

## 2017-12-01 DIAGNOSIS — R2689 Other abnormalities of gait and mobility: Secondary | ICD-10-CM | POA: Diagnosis not present

## 2017-12-01 DIAGNOSIS — R0602 Shortness of breath: Secondary | ICD-10-CM | POA: Diagnosis not present

## 2017-12-01 DIAGNOSIS — R1312 Dysphagia, oropharyngeal phase: Secondary | ICD-10-CM | POA: Diagnosis not present

## 2017-12-01 DIAGNOSIS — M62562 Muscle wasting and atrophy, not elsewhere classified, left lower leg: Secondary | ICD-10-CM | POA: Diagnosis not present

## 2017-12-01 DIAGNOSIS — M6389 Disorders of muscle in diseases classified elsewhere, multiple sites: Secondary | ICD-10-CM | POA: Diagnosis not present

## 2017-12-02 DIAGNOSIS — M62562 Muscle wasting and atrophy, not elsewhere classified, left lower leg: Secondary | ICD-10-CM | POA: Diagnosis not present

## 2017-12-02 DIAGNOSIS — R2689 Other abnormalities of gait and mobility: Secondary | ICD-10-CM | POA: Diagnosis not present

## 2017-12-02 DIAGNOSIS — R1312 Dysphagia, oropharyngeal phase: Secondary | ICD-10-CM | POA: Diagnosis not present

## 2017-12-02 DIAGNOSIS — M6389 Disorders of muscle in diseases classified elsewhere, multiple sites: Secondary | ICD-10-CM | POA: Diagnosis not present

## 2017-12-02 DIAGNOSIS — R0602 Shortness of breath: Secondary | ICD-10-CM | POA: Diagnosis not present

## 2017-12-02 DIAGNOSIS — M62561 Muscle wasting and atrophy, not elsewhere classified, right lower leg: Secondary | ICD-10-CM | POA: Diagnosis not present

## 2017-12-03 DIAGNOSIS — M62561 Muscle wasting and atrophy, not elsewhere classified, right lower leg: Secondary | ICD-10-CM | POA: Diagnosis not present

## 2017-12-03 DIAGNOSIS — M6389 Disorders of muscle in diseases classified elsewhere, multiple sites: Secondary | ICD-10-CM | POA: Diagnosis not present

## 2017-12-03 DIAGNOSIS — R1312 Dysphagia, oropharyngeal phase: Secondary | ICD-10-CM | POA: Diagnosis not present

## 2017-12-03 DIAGNOSIS — M62562 Muscle wasting and atrophy, not elsewhere classified, left lower leg: Secondary | ICD-10-CM | POA: Diagnosis not present

## 2017-12-03 DIAGNOSIS — R0602 Shortness of breath: Secondary | ICD-10-CM | POA: Diagnosis not present

## 2017-12-03 DIAGNOSIS — R2689 Other abnormalities of gait and mobility: Secondary | ICD-10-CM | POA: Diagnosis not present

## 2017-12-06 DIAGNOSIS — R05 Cough: Secondary | ICD-10-CM | POA: Diagnosis not present

## 2017-12-06 DIAGNOSIS — R2689 Other abnormalities of gait and mobility: Secondary | ICD-10-CM | POA: Diagnosis not present

## 2017-12-06 DIAGNOSIS — R0609 Other forms of dyspnea: Secondary | ICD-10-CM | POA: Diagnosis not present

## 2017-12-06 DIAGNOSIS — M62562 Muscle wasting and atrophy, not elsewhere classified, left lower leg: Secondary | ICD-10-CM | POA: Diagnosis not present

## 2017-12-06 DIAGNOSIS — J40 Bronchitis, not specified as acute or chronic: Secondary | ICD-10-CM | POA: Diagnosis not present

## 2017-12-06 DIAGNOSIS — R1312 Dysphagia, oropharyngeal phase: Secondary | ICD-10-CM | POA: Diagnosis not present

## 2017-12-06 DIAGNOSIS — M62561 Muscle wasting and atrophy, not elsewhere classified, right lower leg: Secondary | ICD-10-CM | POA: Diagnosis not present

## 2017-12-06 DIAGNOSIS — R0602 Shortness of breath: Secondary | ICD-10-CM | POA: Diagnosis not present

## 2017-12-06 DIAGNOSIS — M6389 Disorders of muscle in diseases classified elsewhere, multiple sites: Secondary | ICD-10-CM | POA: Diagnosis not present

## 2017-12-06 DIAGNOSIS — Z6820 Body mass index (BMI) 20.0-20.9, adult: Secondary | ICD-10-CM | POA: Diagnosis not present

## 2017-12-07 DIAGNOSIS — M62562 Muscle wasting and atrophy, not elsewhere classified, left lower leg: Secondary | ICD-10-CM | POA: Diagnosis not present

## 2017-12-07 DIAGNOSIS — R1312 Dysphagia, oropharyngeal phase: Secondary | ICD-10-CM | POA: Diagnosis not present

## 2017-12-07 DIAGNOSIS — M6389 Disorders of muscle in diseases classified elsewhere, multiple sites: Secondary | ICD-10-CM | POA: Diagnosis not present

## 2017-12-07 DIAGNOSIS — M62561 Muscle wasting and atrophy, not elsewhere classified, right lower leg: Secondary | ICD-10-CM | POA: Diagnosis not present

## 2017-12-07 DIAGNOSIS — R0602 Shortness of breath: Secondary | ICD-10-CM | POA: Diagnosis not present

## 2017-12-07 DIAGNOSIS — R2689 Other abnormalities of gait and mobility: Secondary | ICD-10-CM | POA: Diagnosis not present

## 2017-12-08 DIAGNOSIS — R1312 Dysphagia, oropharyngeal phase: Secondary | ICD-10-CM | POA: Diagnosis not present

## 2017-12-08 DIAGNOSIS — R0602 Shortness of breath: Secondary | ICD-10-CM | POA: Diagnosis not present

## 2017-12-08 DIAGNOSIS — R2689 Other abnormalities of gait and mobility: Secondary | ICD-10-CM | POA: Diagnosis not present

## 2017-12-08 DIAGNOSIS — M6389 Disorders of muscle in diseases classified elsewhere, multiple sites: Secondary | ICD-10-CM | POA: Diagnosis not present

## 2017-12-08 DIAGNOSIS — M62561 Muscle wasting and atrophy, not elsewhere classified, right lower leg: Secondary | ICD-10-CM | POA: Diagnosis not present

## 2017-12-08 DIAGNOSIS — M62562 Muscle wasting and atrophy, not elsewhere classified, left lower leg: Secondary | ICD-10-CM | POA: Diagnosis not present

## 2017-12-09 DIAGNOSIS — M62562 Muscle wasting and atrophy, not elsewhere classified, left lower leg: Secondary | ICD-10-CM | POA: Diagnosis not present

## 2017-12-09 DIAGNOSIS — R0602 Shortness of breath: Secondary | ICD-10-CM | POA: Diagnosis not present

## 2017-12-09 DIAGNOSIS — R2689 Other abnormalities of gait and mobility: Secondary | ICD-10-CM | POA: Diagnosis not present

## 2017-12-09 DIAGNOSIS — R1312 Dysphagia, oropharyngeal phase: Secondary | ICD-10-CM | POA: Diagnosis not present

## 2017-12-09 DIAGNOSIS — M62561 Muscle wasting and atrophy, not elsewhere classified, right lower leg: Secondary | ICD-10-CM | POA: Diagnosis not present

## 2017-12-09 DIAGNOSIS — M6389 Disorders of muscle in diseases classified elsewhere, multiple sites: Secondary | ICD-10-CM | POA: Diagnosis not present

## 2017-12-10 DIAGNOSIS — M62562 Muscle wasting and atrophy, not elsewhere classified, left lower leg: Secondary | ICD-10-CM | POA: Diagnosis not present

## 2017-12-10 DIAGNOSIS — M62561 Muscle wasting and atrophy, not elsewhere classified, right lower leg: Secondary | ICD-10-CM | POA: Diagnosis not present

## 2017-12-10 DIAGNOSIS — R1312 Dysphagia, oropharyngeal phase: Secondary | ICD-10-CM | POA: Diagnosis not present

## 2017-12-10 DIAGNOSIS — M6389 Disorders of muscle in diseases classified elsewhere, multiple sites: Secondary | ICD-10-CM | POA: Diagnosis not present

## 2017-12-10 DIAGNOSIS — R0602 Shortness of breath: Secondary | ICD-10-CM | POA: Diagnosis not present

## 2017-12-10 DIAGNOSIS — R2689 Other abnormalities of gait and mobility: Secondary | ICD-10-CM | POA: Diagnosis not present

## 2017-12-13 DIAGNOSIS — M62562 Muscle wasting and atrophy, not elsewhere classified, left lower leg: Secondary | ICD-10-CM | POA: Diagnosis not present

## 2017-12-13 DIAGNOSIS — M6389 Disorders of muscle in diseases classified elsewhere, multiple sites: Secondary | ICD-10-CM | POA: Diagnosis not present

## 2017-12-13 DIAGNOSIS — R1312 Dysphagia, oropharyngeal phase: Secondary | ICD-10-CM | POA: Diagnosis not present

## 2017-12-13 DIAGNOSIS — M62561 Muscle wasting and atrophy, not elsewhere classified, right lower leg: Secondary | ICD-10-CM | POA: Diagnosis not present

## 2017-12-13 DIAGNOSIS — R2689 Other abnormalities of gait and mobility: Secondary | ICD-10-CM | POA: Diagnosis not present

## 2017-12-13 DIAGNOSIS — R0602 Shortness of breath: Secondary | ICD-10-CM | POA: Diagnosis not present

## 2017-12-14 DIAGNOSIS — M62562 Muscle wasting and atrophy, not elsewhere classified, left lower leg: Secondary | ICD-10-CM | POA: Diagnosis not present

## 2017-12-14 DIAGNOSIS — M62561 Muscle wasting and atrophy, not elsewhere classified, right lower leg: Secondary | ICD-10-CM | POA: Diagnosis not present

## 2017-12-14 DIAGNOSIS — M6389 Disorders of muscle in diseases classified elsewhere, multiple sites: Secondary | ICD-10-CM | POA: Diagnosis not present

## 2017-12-14 DIAGNOSIS — R2689 Other abnormalities of gait and mobility: Secondary | ICD-10-CM | POA: Diagnosis not present

## 2017-12-14 DIAGNOSIS — R0602 Shortness of breath: Secondary | ICD-10-CM | POA: Diagnosis not present

## 2017-12-14 DIAGNOSIS — R1312 Dysphagia, oropharyngeal phase: Secondary | ICD-10-CM | POA: Diagnosis not present

## 2017-12-16 DIAGNOSIS — R2689 Other abnormalities of gait and mobility: Secondary | ICD-10-CM | POA: Diagnosis not present

## 2017-12-16 DIAGNOSIS — M62562 Muscle wasting and atrophy, not elsewhere classified, left lower leg: Secondary | ICD-10-CM | POA: Diagnosis not present

## 2017-12-16 DIAGNOSIS — M6389 Disorders of muscle in diseases classified elsewhere, multiple sites: Secondary | ICD-10-CM | POA: Diagnosis not present

## 2017-12-16 DIAGNOSIS — R0602 Shortness of breath: Secondary | ICD-10-CM | POA: Diagnosis not present

## 2017-12-16 DIAGNOSIS — M62561 Muscle wasting and atrophy, not elsewhere classified, right lower leg: Secondary | ICD-10-CM | POA: Diagnosis not present

## 2017-12-16 DIAGNOSIS — R1312 Dysphagia, oropharyngeal phase: Secondary | ICD-10-CM | POA: Diagnosis not present

## 2017-12-16 DIAGNOSIS — Z7901 Long term (current) use of anticoagulants: Secondary | ICD-10-CM | POA: Diagnosis not present

## 2017-12-16 DIAGNOSIS — Z23 Encounter for immunization: Secondary | ICD-10-CM | POA: Diagnosis not present

## 2017-12-16 DIAGNOSIS — I480 Paroxysmal atrial fibrillation: Secondary | ICD-10-CM | POA: Diagnosis not present

## 2017-12-17 DIAGNOSIS — R1312 Dysphagia, oropharyngeal phase: Secondary | ICD-10-CM | POA: Diagnosis not present

## 2017-12-17 DIAGNOSIS — M62561 Muscle wasting and atrophy, not elsewhere classified, right lower leg: Secondary | ICD-10-CM | POA: Diagnosis not present

## 2017-12-17 DIAGNOSIS — R0602 Shortness of breath: Secondary | ICD-10-CM | POA: Diagnosis not present

## 2017-12-17 DIAGNOSIS — M62562 Muscle wasting and atrophy, not elsewhere classified, left lower leg: Secondary | ICD-10-CM | POA: Diagnosis not present

## 2017-12-17 DIAGNOSIS — M6389 Disorders of muscle in diseases classified elsewhere, multiple sites: Secondary | ICD-10-CM | POA: Diagnosis not present

## 2017-12-17 DIAGNOSIS — R2689 Other abnormalities of gait and mobility: Secondary | ICD-10-CM | POA: Diagnosis not present

## 2017-12-20 DIAGNOSIS — M62561 Muscle wasting and atrophy, not elsewhere classified, right lower leg: Secondary | ICD-10-CM | POA: Diagnosis not present

## 2017-12-20 DIAGNOSIS — M62562 Muscle wasting and atrophy, not elsewhere classified, left lower leg: Secondary | ICD-10-CM | POA: Diagnosis not present

## 2017-12-20 DIAGNOSIS — M6389 Disorders of muscle in diseases classified elsewhere, multiple sites: Secondary | ICD-10-CM | POA: Diagnosis not present

## 2017-12-20 DIAGNOSIS — R2689 Other abnormalities of gait and mobility: Secondary | ICD-10-CM | POA: Diagnosis not present

## 2017-12-20 DIAGNOSIS — R1312 Dysphagia, oropharyngeal phase: Secondary | ICD-10-CM | POA: Diagnosis not present

## 2017-12-20 DIAGNOSIS — R0602 Shortness of breath: Secondary | ICD-10-CM | POA: Diagnosis not present

## 2017-12-21 DIAGNOSIS — M62562 Muscle wasting and atrophy, not elsewhere classified, left lower leg: Secondary | ICD-10-CM | POA: Diagnosis not present

## 2017-12-21 DIAGNOSIS — R0602 Shortness of breath: Secondary | ICD-10-CM | POA: Diagnosis not present

## 2017-12-21 DIAGNOSIS — R2689 Other abnormalities of gait and mobility: Secondary | ICD-10-CM | POA: Diagnosis not present

## 2017-12-21 DIAGNOSIS — M62561 Muscle wasting and atrophy, not elsewhere classified, right lower leg: Secondary | ICD-10-CM | POA: Diagnosis not present

## 2017-12-21 DIAGNOSIS — M6389 Disorders of muscle in diseases classified elsewhere, multiple sites: Secondary | ICD-10-CM | POA: Diagnosis not present

## 2017-12-21 DIAGNOSIS — R1312 Dysphagia, oropharyngeal phase: Secondary | ICD-10-CM | POA: Diagnosis not present

## 2017-12-22 DIAGNOSIS — R0602 Shortness of breath: Secondary | ICD-10-CM | POA: Diagnosis not present

## 2017-12-22 DIAGNOSIS — M62562 Muscle wasting and atrophy, not elsewhere classified, left lower leg: Secondary | ICD-10-CM | POA: Diagnosis not present

## 2017-12-22 DIAGNOSIS — R1312 Dysphagia, oropharyngeal phase: Secondary | ICD-10-CM | POA: Diagnosis not present

## 2017-12-22 DIAGNOSIS — M62561 Muscle wasting and atrophy, not elsewhere classified, right lower leg: Secondary | ICD-10-CM | POA: Diagnosis not present

## 2017-12-22 DIAGNOSIS — R2689 Other abnormalities of gait and mobility: Secondary | ICD-10-CM | POA: Diagnosis not present

## 2017-12-22 DIAGNOSIS — M6389 Disorders of muscle in diseases classified elsewhere, multiple sites: Secondary | ICD-10-CM | POA: Diagnosis not present

## 2017-12-23 ENCOUNTER — Encounter: Payer: Self-pay | Admitting: Cardiovascular Disease

## 2017-12-23 ENCOUNTER — Telehealth: Payer: Self-pay | Admitting: Cardiovascular Disease

## 2017-12-23 ENCOUNTER — Ambulatory Visit (INDEPENDENT_AMBULATORY_CARE_PROVIDER_SITE_OTHER): Payer: Medicare Other | Admitting: Cardiovascular Disease

## 2017-12-23 VITALS — BP 116/68 | HR 86 | Ht 64.0 in | Wt 119.0 lb

## 2017-12-23 DIAGNOSIS — I4811 Longstanding persistent atrial fibrillation: Secondary | ICD-10-CM

## 2017-12-23 DIAGNOSIS — I50812 Chronic right heart failure: Secondary | ICD-10-CM | POA: Diagnosis not present

## 2017-12-23 DIAGNOSIS — Z7901 Long term (current) use of anticoagulants: Secondary | ICD-10-CM | POA: Diagnosis not present

## 2017-12-23 DIAGNOSIS — J432 Centrilobular emphysema: Secondary | ICD-10-CM

## 2017-12-23 DIAGNOSIS — I2721 Secondary pulmonary arterial hypertension: Secondary | ICD-10-CM | POA: Diagnosis not present

## 2017-12-23 DIAGNOSIS — J439 Emphysema, unspecified: Secondary | ICD-10-CM

## 2017-12-23 HISTORY — DX: Centrilobular emphysema: J43.2

## 2017-12-23 NOTE — Patient Instructions (Signed)
Medication Instructions:  Dr Sallyanne Kuster recommends that you continue on your current medications as directed. Please refer to the Current Medication list given to you today.  If you need a refill on your cardiac medications before your next appointment, please call your pharmacy.   Follow-Up: At [facility], you and your health needs are our priority.  As part of our continuing mission to provide you with exceptional heart care, we have created designated Provider Care Teams.  These Care Teams include your primary Cardiologist (physician) and Advanced Practice Providers (APPs -  Physician Assistants and Nurse Practitioners) who all work together to provide you with the care you need, when you need it. You will need a follow up appointment in 6 months.  Please call our office 2 months in advance to schedule this appointment.  You may see Sanda Klein, MD or one of the following Advanced Practice Providers on your designated Care Team: [place], [place] . Fabian Sharp, PA-C

## 2017-12-23 NOTE — Telephone Encounter (Signed)
New Message:    [name] he just faxed over orders, need them back asap.

## 2017-12-23 NOTE — Progress Notes (Signed)
Cardiology Office Note:    Date:  12/23/2017   ID:  [name], DOB Jan 27, 1925, MRN [id number]  PCP:  Shon Baton, MD  Cardiologist:  Sanda Klein, MD   Referring MD: Shon Baton, MD   No chief complaint on file.   History of Present Illness:    [name] is a 84 y.o. female with a hx of long-standing persistent atrial fibrillation, severe chronic lung disease, moderate pulmonary hypertension, mild systemic hypertension, reported diagnosis of chronic diastolic heart failure, presenting with complaints of exertional dyspnea and fatigue. She has a history of left breast lumpectomy and radiation therapy in the remote past. She is accompanied by her daughter, [name].  [name] was briefly hospitalized in mid-September for acute bronchitis with worsening respiratory failure.  She has since returned to [facility] and has competed her rehab in the nursing section of the facility.  She is back to independent living.  She continues to have daily shortness of breath with minimal activity.  She has not had problems with leg edema and denies orthopnea or PND.  Her activity is limited by dyspnea, NYHA functional class IIIb.  She continues to have a daily cough but this is productive of scanty amounts of white sputum.  She does not have audible wheezing.  He has not had any recent fever or chills.  She denies angina, palpitations, leg edema, claudication, focal neurological events, bleeding problems, falls or injuries.  She is compliant with warfarin anticoagulation and follow-up.  Pulmonary function test performed in 2016 showed severe obstructive lung disease with an FEV1 of roughly 750 mL,  45% of predicted, only minimal improvement after bronchodilators. She has a less prominent component of restrictive lung disease with FVC roughly 70% of predicted. Her echocardiogram performed in 2015 showed enlargement of the right heart chambers, moderate to severe right cuspid regurgitation,  moderate pulmonary hypertension estimate a systolic PA pressure of 52 mmHg, normal left ventricular systolic function. CT in 2016 did not show pulmonary embolism or significant parenchymal infiltrates.  Although her records list diastolic dysfunction, this is not really supported by echo findings (hard to interpret in the setting of atrial fibrillation) and notes report that she did not have any improvement with diuretic therapy and had significant electrolyte imbalances when aggressive diuresis was attempted.  She has a very limited history of personal smoking (one pack year). She did have some secondhand exposure from her husband, although he never smoked in the house once the children were born. She had limited improvement with pulmonary rehabilitation, but does not want to continue in that process. She had no improvement with inhaled albuterol.   Oxygen supplementation was recommended in January of this year, but she declined at that time.  Pulse oximetry performed in the office today showed the following findings:  Baseline at rest 87% Following exercise 76-80% After oxygen supplementation 2L and rest 94%.    Past Medical History:  Diagnosis Date  . Breast cancer ([place])    left   . Chronic anticoagulation   . Chronic atrial fibrillation   . Hypertension   . Pelvic fracture ([place]) 05/2016    Past Surgical History:  Procedure Laterality Date  . ABDOMINAL HYSTERECTOMY    . BREAST LUMPECTOMY      Current Medications: Current Meds  Medication Sig  . acetaminophen (TYLENOL) 500 MG tablet Take 1,000 mg by mouth at bedtime.  Marland Kitchen allopurinol (ZYLOPRIM) 100 MG tablet Take 100 mg by mouth at bedtime.   . ALPRAZolam (XANAX) 0.5 MG  tablet Take 0.5 mg by mouth at bedtime.   . furosemide (LASIX) 40 MG tablet TAKE 2 TABLETS BY MOUTH ONCE DAILY.  . metoprolol succinate (TOPROL-XL) 50 MG 24 hr tablet Take 1 tablet (50 mg total) by mouth daily.  . potassium chloride (K-DUR) 10 MEQ tablet Take 1  tablet (10 mEq total) by mouth daily.  Marland Kitchen warfarin (COUMADIN) 2 MG tablet Take 2-4 mg by mouth daily. Pt takes one tablet daily (2 mg) daily except for on Thursday pt takes two tablets (4 mg)     Allergies:   Aspirin and Codeine   Social History   Socioeconomic History  . Marital status: Married    Spouse name: Not on file  . Number of children: Not on file  . Years of education: Not on file  . Highest education level: Not on file  Occupational History  . Not on file  Social Needs  . Financial resource strain: Not on file  . Food insecurity:    Worry: Not on file    Inability: Not on file  . Transportation needs:    Medical: Not on file    Non-medical: Not on file  Tobacco Use  . Smoking status: Former Smoker    Packs/day: 0.20    Years: 5.00    Pack years: 1.00    Types: Cigarettes  . Smokeless tobacco: Never Used  . Tobacco comment: pt has not smoked in 50 years  Substance and Sexual Activity  . Alcohol use: Yes    Alcohol/week: 2.0 standard drinks    Types: 2 Glasses of wine per week  . Drug use: No  . Sexual activity: Not on file  Lifestyle  . Physical activity:    Days per week: Not on file    Minutes per session: Not on file  . Stress: Not on file  Relationships  . Social connections:    Talks on phone: Not on file    Gets together: Not on file    Attends religious service: Not on file    Active member of club or organization: Not on file    Attends meetings of clubs or organizations: Not on file    Relationship status: Not on file  Other Topics Concern  . Not on file  Social History Narrative  . Not on file     Family History: The patient's family history includes Asthma in her sister and son; Colon cancer in her father; Heart disease in her mother; Ulcers in her father.  ROS:   Please see the history of present illness.    All other systems are reviewed and are negative.  EKGs/Labs/Other Studies Reviewed:    The following studies were reviewed  today: Pulmonary function tests 2016, echo 2015, notes from Dr. Tamala Julian and Dr. Lamonte Sakai  EKG:  EKG is not ordered today.    Recent Labs: 11/18/2017: ALT 16; B Natriuretic Peptide 902.1; Magnesium 1.8; TSH 1.602 11/19/2017: BUN 22; Creatinine, Ser 1.12; Hemoglobin 13.2; Platelets 150; Potassium 3.2; Sodium 139  Recent Lipid Panel No results found for: CHOL, TRIG, HDL, CHOLHDL, VLDL, LDLCALC, LDLDIRECT  Physical Exam:    VS:  BP 116/68 (BP Location: Right Arm, Patient Position: Sitting, Cuff Size: Normal)   Pulse 86   Ht _0  (1.626 m)   Wt 119 lb (54 kg)   LMP  (Exact Date)   SpO2 95%   BMI 20.43 kg/m     Wt Readings from Last 3 Encounters:  12/23/17 119 lb (54  kg)  11/18/17 120 lb 9.5 oz (54.7 kg)  07/12/17 123 lb (55.8 kg)      General: Alert, oriented x3, no distress, appears elderly and frail Head: no evidence of trauma, PERRL, EOMI, no exophtalmos or lid lag, no myxedema, no xanthelasma; normal ears, nose and oropharynx Neck: normal jugular venous pulsations and no hepatojugular reflux; brisk carotid pulses without delay and no carotid bruits Chest: Diffusely diminished bilaterally, but otherwise clear to auscultation, no signs of consolidation by percussion or palpation, normal fremitus, symmetrical and full respiratory excursions Cardiovascular: normal position and quality of the apical impulse, irregular rhythm, normal first and second heart sounds, no murmurs, rubs or gallops Abdomen: no tenderness or distention, no masses by palpation, no abnormal pulsatility or arterial bruits, normal bowel sounds, no hepatosplenomegaly Extremities: no clubbing, cyanosis or edema; 2+ radial, ulnar and brachial pulses bilaterally; 2+ right femoral, posterior tibial and dorsalis pedis pulses; 2+ left femoral, posterior tibial and dorsalis pedis pulses; no subclavian or femoral bruits Neurological: grossly nonfocal Psych: Normal mood and affect   ASSESSMENT:    1. PAH (pulmonary artery  hypertension) ([place])   2. Chronic right-sided heart failure ([place])   3. Centrilobular emphysema ([place])   4. Longstanding persistent atrial fibrillation   5. Long term (current) use of anticoagulants    PLAN:    In order of problems listed above:  1. PAH: In my opinion this is primarily related to chronic respiratory illness and there is only minimal if any I don't think there is any evidence that she has left ventricular diastolic dysfunction. In fact, her tissue Doppler velocities on the echocardiogram from 2015 are quite normal especially for a nonagenarian.  The tissue Doppler values are high enough to exclude cardiac amyloidosis or radiation-induced cardiac fibrosis, although I cannot fully exclude a component of constrictive pericarditis.  Her biatrial enlargement is likely related to long-standing atrial fibrillation.  I believe the most important treatment would be oxygen supplementation. 2. CHF: Has had past problems with swelling likely related to right heart failure, appears euvolemic today.  Weight is lower than at her previous appointment. 3. COPD: Phenotypically, she fully meets the pattern of a "pink puffer".  Would benefit from oxygen supplementation since she has resting oxygen saturation of 87% with further drastic reduction in oxygen saturation with minimal activity down to less than 80%.  Oxygen supplementation there was marked improvement with oxygen saturation back up to 94%. 4. AFib: Adequate rate control.  On warfarin anticoagulation. 5. Warfarin: Well-tolerated without bleeding complications. No history of stroke or other embolic events. CHADSvasc 5 (age 87, gender, HTN, CHF).   Medication Adjustments/Labs and Tests Ordered: Current medicines are reviewed at length with the patient today.  Concerns regarding medicines are outlined above.  No orders of the defined types were placed in this encounter.  No orders of the defined types were placed in this  encounter.   Signed, Sanda Klein, MD  12/23/2017 3:16 PM    [place]

## 2017-12-24 DIAGNOSIS — R1312 Dysphagia, oropharyngeal phase: Secondary | ICD-10-CM | POA: Diagnosis not present

## 2017-12-24 DIAGNOSIS — M62562 Muscle wasting and atrophy, not elsewhere classified, left lower leg: Secondary | ICD-10-CM | POA: Diagnosis not present

## 2017-12-24 DIAGNOSIS — M62561 Muscle wasting and atrophy, not elsewhere classified, right lower leg: Secondary | ICD-10-CM | POA: Diagnosis not present

## 2017-12-24 DIAGNOSIS — R0602 Shortness of breath: Secondary | ICD-10-CM | POA: Diagnosis not present

## 2017-12-24 DIAGNOSIS — R2689 Other abnormalities of gait and mobility: Secondary | ICD-10-CM | POA: Diagnosis not present

## 2017-12-24 DIAGNOSIS — M6389 Disorders of muscle in diseases classified elsewhere, multiple sites: Secondary | ICD-10-CM | POA: Diagnosis not present

## 2017-12-24 NOTE — Telephone Encounter (Addendum)
Orders received from [facility] this afternoon. MD is currently out of office. Called Jiles Crocker to f/up on when patient's O2 can be delivered and if it can be done without signed MD orders (as it should be in Epic as well)

## 2017-12-24 NOTE — Telephone Encounter (Signed)
MD signed order for O2. Faxed to [place]

## 2017-12-24 NOTE — Telephone Encounter (Signed)
Spoke with [name] who states he has to have signed paper order from MD. O2 can be delivered on Saturday as well.

## 2017-12-27 DIAGNOSIS — M6389 Disorders of muscle in diseases classified elsewhere, multiple sites: Secondary | ICD-10-CM | POA: Diagnosis not present

## 2017-12-27 DIAGNOSIS — R0602 Shortness of breath: Secondary | ICD-10-CM | POA: Diagnosis not present

## 2017-12-27 DIAGNOSIS — M62561 Muscle wasting and atrophy, not elsewhere classified, right lower leg: Secondary | ICD-10-CM | POA: Diagnosis not present

## 2017-12-27 DIAGNOSIS — M62562 Muscle wasting and atrophy, not elsewhere classified, left lower leg: Secondary | ICD-10-CM | POA: Diagnosis not present

## 2017-12-27 DIAGNOSIS — R1312 Dysphagia, oropharyngeal phase: Secondary | ICD-10-CM | POA: Diagnosis not present

## 2017-12-27 DIAGNOSIS — R2689 Other abnormalities of gait and mobility: Secondary | ICD-10-CM | POA: Diagnosis not present

## 2017-12-28 DIAGNOSIS — M6389 Disorders of muscle in diseases classified elsewhere, multiple sites: Secondary | ICD-10-CM | POA: Diagnosis not present

## 2017-12-28 DIAGNOSIS — M62561 Muscle wasting and atrophy, not elsewhere classified, right lower leg: Secondary | ICD-10-CM | POA: Diagnosis not present

## 2017-12-28 DIAGNOSIS — R0602 Shortness of breath: Secondary | ICD-10-CM | POA: Diagnosis not present

## 2017-12-28 DIAGNOSIS — R1312 Dysphagia, oropharyngeal phase: Secondary | ICD-10-CM | POA: Diagnosis not present

## 2017-12-28 DIAGNOSIS — M62562 Muscle wasting and atrophy, not elsewhere classified, left lower leg: Secondary | ICD-10-CM | POA: Diagnosis not present

## 2017-12-28 DIAGNOSIS — R2689 Other abnormalities of gait and mobility: Secondary | ICD-10-CM | POA: Diagnosis not present

## 2017-12-29 DIAGNOSIS — M6389 Disorders of muscle in diseases classified elsewhere, multiple sites: Secondary | ICD-10-CM | POA: Diagnosis not present

## 2017-12-29 DIAGNOSIS — R0602 Shortness of breath: Secondary | ICD-10-CM | POA: Diagnosis not present

## 2017-12-29 DIAGNOSIS — R2689 Other abnormalities of gait and mobility: Secondary | ICD-10-CM | POA: Diagnosis not present

## 2017-12-29 DIAGNOSIS — M62562 Muscle wasting and atrophy, not elsewhere classified, left lower leg: Secondary | ICD-10-CM | POA: Diagnosis not present

## 2017-12-29 DIAGNOSIS — M62561 Muscle wasting and atrophy, not elsewhere classified, right lower leg: Secondary | ICD-10-CM | POA: Diagnosis not present

## 2017-12-29 DIAGNOSIS — R1312 Dysphagia, oropharyngeal phase: Secondary | ICD-10-CM | POA: Diagnosis not present

## 2017-12-30 DIAGNOSIS — R2689 Other abnormalities of gait and mobility: Secondary | ICD-10-CM | POA: Diagnosis not present

## 2017-12-30 DIAGNOSIS — M62561 Muscle wasting and atrophy, not elsewhere classified, right lower leg: Secondary | ICD-10-CM | POA: Diagnosis not present

## 2017-12-30 DIAGNOSIS — M62562 Muscle wasting and atrophy, not elsewhere classified, left lower leg: Secondary | ICD-10-CM | POA: Diagnosis not present

## 2017-12-30 DIAGNOSIS — R1312 Dysphagia, oropharyngeal phase: Secondary | ICD-10-CM | POA: Diagnosis not present

## 2017-12-30 DIAGNOSIS — M6389 Disorders of muscle in diseases classified elsewhere, multiple sites: Secondary | ICD-10-CM | POA: Diagnosis not present

## 2017-12-30 DIAGNOSIS — R0602 Shortness of breath: Secondary | ICD-10-CM | POA: Diagnosis not present

## 2017-12-31 DIAGNOSIS — I5032 Chronic diastolic (congestive) heart failure: Secondary | ICD-10-CM | POA: Diagnosis not present

## 2017-12-31 DIAGNOSIS — J9601 Acute respiratory failure with hypoxia: Secondary | ICD-10-CM | POA: Diagnosis not present

## 2017-12-31 DIAGNOSIS — R278 Other lack of coordination: Secondary | ICD-10-CM | POA: Diagnosis not present

## 2017-12-31 DIAGNOSIS — R0602 Shortness of breath: Secondary | ICD-10-CM | POA: Diagnosis not present

## 2017-12-31 DIAGNOSIS — J441 Chronic obstructive pulmonary disease with (acute) exacerbation: Secondary | ICD-10-CM | POA: Diagnosis not present

## 2017-12-31 DIAGNOSIS — M6389 Disorders of muscle in diseases classified elsewhere, multiple sites: Secondary | ICD-10-CM | POA: Diagnosis not present

## 2017-12-31 DIAGNOSIS — I2723 Pulmonary hypertension due to lung diseases and hypoxia: Secondary | ICD-10-CM | POA: Diagnosis not present

## 2017-12-31 DIAGNOSIS — R2689 Other abnormalities of gait and mobility: Secondary | ICD-10-CM | POA: Diagnosis not present

## 2017-12-31 DIAGNOSIS — M62561 Muscle wasting and atrophy, not elsewhere classified, right lower leg: Secondary | ICD-10-CM | POA: Diagnosis not present

## 2017-12-31 DIAGNOSIS — M62562 Muscle wasting and atrophy, not elsewhere classified, left lower leg: Secondary | ICD-10-CM | POA: Diagnosis not present

## 2018-01-03 DIAGNOSIS — M62562 Muscle wasting and atrophy, not elsewhere classified, left lower leg: Secondary | ICD-10-CM | POA: Diagnosis not present

## 2018-01-03 DIAGNOSIS — R2689 Other abnormalities of gait and mobility: Secondary | ICD-10-CM | POA: Diagnosis not present

## 2018-01-03 DIAGNOSIS — R0602 Shortness of breath: Secondary | ICD-10-CM | POA: Diagnosis not present

## 2018-01-03 DIAGNOSIS — R278 Other lack of coordination: Secondary | ICD-10-CM | POA: Diagnosis not present

## 2018-01-03 DIAGNOSIS — M62561 Muscle wasting and atrophy, not elsewhere classified, right lower leg: Secondary | ICD-10-CM | POA: Diagnosis not present

## 2018-01-03 DIAGNOSIS — M6389 Disorders of muscle in diseases classified elsewhere, multiple sites: Secondary | ICD-10-CM | POA: Diagnosis not present

## 2018-01-04 DIAGNOSIS — M62561 Muscle wasting and atrophy, not elsewhere classified, right lower leg: Secondary | ICD-10-CM | POA: Diagnosis not present

## 2018-01-04 DIAGNOSIS — M6389 Disorders of muscle in diseases classified elsewhere, multiple sites: Secondary | ICD-10-CM | POA: Diagnosis not present

## 2018-01-04 DIAGNOSIS — R2689 Other abnormalities of gait and mobility: Secondary | ICD-10-CM | POA: Diagnosis not present

## 2018-01-04 DIAGNOSIS — R278 Other lack of coordination: Secondary | ICD-10-CM | POA: Diagnosis not present

## 2018-01-04 DIAGNOSIS — M62562 Muscle wasting and atrophy, not elsewhere classified, left lower leg: Secondary | ICD-10-CM | POA: Diagnosis not present

## 2018-01-04 DIAGNOSIS — R0602 Shortness of breath: Secondary | ICD-10-CM | POA: Diagnosis not present

## 2018-01-05 DIAGNOSIS — R0602 Shortness of breath: Secondary | ICD-10-CM | POA: Diagnosis not present

## 2018-01-05 DIAGNOSIS — M6389 Disorders of muscle in diseases classified elsewhere, multiple sites: Secondary | ICD-10-CM | POA: Diagnosis not present

## 2018-01-05 DIAGNOSIS — R2689 Other abnormalities of gait and mobility: Secondary | ICD-10-CM | POA: Diagnosis not present

## 2018-01-05 DIAGNOSIS — R278 Other lack of coordination: Secondary | ICD-10-CM | POA: Diagnosis not present

## 2018-01-05 DIAGNOSIS — M62561 Muscle wasting and atrophy, not elsewhere classified, right lower leg: Secondary | ICD-10-CM | POA: Diagnosis not present

## 2018-01-05 DIAGNOSIS — M62562 Muscle wasting and atrophy, not elsewhere classified, left lower leg: Secondary | ICD-10-CM | POA: Diagnosis not present

## 2018-01-07 DIAGNOSIS — R2689 Other abnormalities of gait and mobility: Secondary | ICD-10-CM | POA: Diagnosis not present

## 2018-01-07 DIAGNOSIS — M62562 Muscle wasting and atrophy, not elsewhere classified, left lower leg: Secondary | ICD-10-CM | POA: Diagnosis not present

## 2018-01-07 DIAGNOSIS — M6389 Disorders of muscle in diseases classified elsewhere, multiple sites: Secondary | ICD-10-CM | POA: Diagnosis not present

## 2018-01-07 DIAGNOSIS — R278 Other lack of coordination: Secondary | ICD-10-CM | POA: Diagnosis not present

## 2018-01-07 DIAGNOSIS — M62561 Muscle wasting and atrophy, not elsewhere classified, right lower leg: Secondary | ICD-10-CM | POA: Diagnosis not present

## 2018-01-07 DIAGNOSIS — R0602 Shortness of breath: Secondary | ICD-10-CM | POA: Diagnosis not present

## 2018-01-09 DIAGNOSIS — M62562 Muscle wasting and atrophy, not elsewhere classified, left lower leg: Secondary | ICD-10-CM | POA: Diagnosis not present

## 2018-01-09 DIAGNOSIS — R2689 Other abnormalities of gait and mobility: Secondary | ICD-10-CM | POA: Diagnosis not present

## 2018-01-09 DIAGNOSIS — R278 Other lack of coordination: Secondary | ICD-10-CM | POA: Diagnosis not present

## 2018-01-09 DIAGNOSIS — M6389 Disorders of muscle in diseases classified elsewhere, multiple sites: Secondary | ICD-10-CM | POA: Diagnosis not present

## 2018-01-09 DIAGNOSIS — R0602 Shortness of breath: Secondary | ICD-10-CM | POA: Diagnosis not present

## 2018-01-09 DIAGNOSIS — M62561 Muscle wasting and atrophy, not elsewhere classified, right lower leg: Secondary | ICD-10-CM | POA: Diagnosis not present

## 2018-01-11 DIAGNOSIS — R0602 Shortness of breath: Secondary | ICD-10-CM | POA: Diagnosis not present

## 2018-01-11 DIAGNOSIS — R2689 Other abnormalities of gait and mobility: Secondary | ICD-10-CM | POA: Diagnosis not present

## 2018-01-11 DIAGNOSIS — R278 Other lack of coordination: Secondary | ICD-10-CM | POA: Diagnosis not present

## 2018-01-11 DIAGNOSIS — M62562 Muscle wasting and atrophy, not elsewhere classified, left lower leg: Secondary | ICD-10-CM | POA: Diagnosis not present

## 2018-01-11 DIAGNOSIS — M6389 Disorders of muscle in diseases classified elsewhere, multiple sites: Secondary | ICD-10-CM | POA: Diagnosis not present

## 2018-01-11 DIAGNOSIS — M62561 Muscle wasting and atrophy, not elsewhere classified, right lower leg: Secondary | ICD-10-CM | POA: Diagnosis not present

## 2018-01-13 DIAGNOSIS — M62562 Muscle wasting and atrophy, not elsewhere classified, left lower leg: Secondary | ICD-10-CM | POA: Diagnosis not present

## 2018-01-13 DIAGNOSIS — R278 Other lack of coordination: Secondary | ICD-10-CM | POA: Diagnosis not present

## 2018-01-13 DIAGNOSIS — R0602 Shortness of breath: Secondary | ICD-10-CM | POA: Diagnosis not present

## 2018-01-13 DIAGNOSIS — M62561 Muscle wasting and atrophy, not elsewhere classified, right lower leg: Secondary | ICD-10-CM | POA: Diagnosis not present

## 2018-01-13 DIAGNOSIS — M6389 Disorders of muscle in diseases classified elsewhere, multiple sites: Secondary | ICD-10-CM | POA: Diagnosis not present

## 2018-01-13 DIAGNOSIS — R2689 Other abnormalities of gait and mobility: Secondary | ICD-10-CM | POA: Diagnosis not present

## 2018-01-14 ENCOUNTER — Ambulatory Visit: Payer: Medicare Other | Admitting: Cardiovascular Disease

## 2018-01-20 DIAGNOSIS — I480 Paroxysmal atrial fibrillation: Secondary | ICD-10-CM | POA: Diagnosis not present

## 2018-01-20 DIAGNOSIS — Z7901 Long term (current) use of anticoagulants: Secondary | ICD-10-CM | POA: Diagnosis not present

## 2018-01-20 DIAGNOSIS — Z6821 Body mass index (BMI) 21.0-21.9, adult: Secondary | ICD-10-CM | POA: Diagnosis not present

## 2018-01-21 DIAGNOSIS — R2689 Other abnormalities of gait and mobility: Secondary | ICD-10-CM | POA: Diagnosis not present

## 2018-01-21 DIAGNOSIS — R278 Other lack of coordination: Secondary | ICD-10-CM | POA: Diagnosis not present

## 2018-01-21 DIAGNOSIS — M62562 Muscle wasting and atrophy, not elsewhere classified, left lower leg: Secondary | ICD-10-CM | POA: Diagnosis not present

## 2018-01-21 DIAGNOSIS — R0602 Shortness of breath: Secondary | ICD-10-CM | POA: Diagnosis not present

## 2018-01-21 DIAGNOSIS — M62561 Muscle wasting and atrophy, not elsewhere classified, right lower leg: Secondary | ICD-10-CM | POA: Diagnosis not present

## 2018-01-21 DIAGNOSIS — M6389 Disorders of muscle in diseases classified elsewhere, multiple sites: Secondary | ICD-10-CM | POA: Diagnosis not present

## 2018-03-03 DIAGNOSIS — Z7901 Long term (current) use of anticoagulants: Secondary | ICD-10-CM | POA: Diagnosis not present

## 2018-03-03 DIAGNOSIS — I480 Paroxysmal atrial fibrillation: Secondary | ICD-10-CM | POA: Diagnosis not present

## 2018-03-05 LAB — PROTIME-INR
Protime: 34.4 — AB (ref 10.0–13.8)
Protime: 34.4 — AB (ref 10.0–13.8)
Protime: 34.4 — AB (ref 10.0–13.8)

## 2018-03-05 LAB — POCT INR
INR: 3.4 — AB (ref 0.9–1.1)
INR: 3.4 — AB (ref 0.9–1.1)
INR: 3.4 — AB (ref <=1.1)

## 2018-03-14 ENCOUNTER — Observation Stay (HOSPITAL_COMMUNITY)
Admission: EM | Admit: 2018-03-14 | Discharge: 2018-03-15 | Disposition: A | Discharge status: Home / Self Care | Payer: Medicare Other | Attending: Internal Medicine | Admitting: Internal Medicine

## 2018-03-14 ENCOUNTER — Encounter (HOSPITAL_COMMUNITY): Payer: Self-pay

## 2018-03-14 ENCOUNTER — Emergency Department (HOSPITAL_COMMUNITY): Payer: Medicare Other

## 2018-03-14 DIAGNOSIS — M6281 Muscle weakness (generalized): Secondary | ICD-10-CM | POA: Diagnosis not present

## 2018-03-14 DIAGNOSIS — Z87891 Personal history of nicotine dependence: Secondary | ICD-10-CM | POA: Insufficient documentation

## 2018-03-14 DIAGNOSIS — N183 Chronic kidney disease, stage 3 unspecified: Secondary | ICD-10-CM

## 2018-03-14 DIAGNOSIS — I2721 Secondary pulmonary arterial hypertension: Secondary | ICD-10-CM | POA: Diagnosis not present

## 2018-03-14 DIAGNOSIS — J9621 Acute and chronic respiratory failure with hypoxia: Secondary | ICD-10-CM | POA: Diagnosis not present

## 2018-03-14 DIAGNOSIS — Z885 Allergy status to narcotic agent status: Secondary | ICD-10-CM | POA: Diagnosis not present

## 2018-03-14 DIAGNOSIS — I482 Chronic atrial fibrillation, unspecified: Secondary | ICD-10-CM | POA: Insufficient documentation

## 2018-03-14 DIAGNOSIS — J441 Chronic obstructive pulmonary disease with (acute) exacerbation: Secondary | ICD-10-CM | POA: Insufficient documentation

## 2018-03-14 DIAGNOSIS — I272 Pulmonary hypertension, unspecified: Secondary | ICD-10-CM

## 2018-03-14 DIAGNOSIS — R262 Difficulty in walking, not elsewhere classified: Secondary | ICD-10-CM | POA: Insufficient documentation

## 2018-03-14 DIAGNOSIS — J9601 Acute respiratory failure with hypoxia: Secondary | ICD-10-CM | POA: Diagnosis not present

## 2018-03-14 DIAGNOSIS — R0902 Hypoxemia: Secondary | ICD-10-CM | POA: Diagnosis not present

## 2018-03-14 DIAGNOSIS — Z825 Family history of asthma and other chronic lower respiratory diseases: Secondary | ICD-10-CM | POA: Insufficient documentation

## 2018-03-14 DIAGNOSIS — Z7901 Long term (current) use of anticoagulants: Secondary | ICD-10-CM | POA: Insufficient documentation

## 2018-03-14 DIAGNOSIS — I4891 Unspecified atrial fibrillation: Secondary | ICD-10-CM | POA: Diagnosis not present

## 2018-03-14 DIAGNOSIS — R9431 Abnormal electrocardiogram [ECG] [EKG]: Secondary | ICD-10-CM | POA: Diagnosis not present

## 2018-03-14 DIAGNOSIS — I129 Hypertensive chronic kidney disease with stage 1 through stage 4 chronic kidney disease, or unspecified chronic kidney disease: Secondary | ICD-10-CM | POA: Diagnosis not present

## 2018-03-14 DIAGNOSIS — Z853 Personal history of malignant neoplasm of breast: Secondary | ICD-10-CM | POA: Diagnosis not present

## 2018-03-14 DIAGNOSIS — Z886 Allergy status to analgesic agent status: Secondary | ICD-10-CM | POA: Insufficient documentation

## 2018-03-14 DIAGNOSIS — Z8249 Family history of ischemic heart disease and other diseases of the circulatory system: Secondary | ICD-10-CM | POA: Diagnosis not present

## 2018-03-14 DIAGNOSIS — Z9981 Dependence on supplemental oxygen: Secondary | ICD-10-CM | POA: Insufficient documentation

## 2018-03-14 DIAGNOSIS — R05 Cough: Secondary | ICD-10-CM | POA: Diagnosis not present

## 2018-03-14 DIAGNOSIS — Z79899 Other long term (current) drug therapy: Secondary | ICD-10-CM | POA: Insufficient documentation

## 2018-03-14 DIAGNOSIS — R531 Weakness: Secondary | ICD-10-CM | POA: Diagnosis not present

## 2018-03-14 DIAGNOSIS — R0602 Shortness of breath: Secondary | ICD-10-CM | POA: Diagnosis not present

## 2018-03-14 DIAGNOSIS — I10 Essential (primary) hypertension: Secondary | ICD-10-CM | POA: Diagnosis not present

## 2018-03-14 LAB — CBC WITH DIFFERENTIAL/PLATELET
Abs Immature Granulocytes: 0.07 10*3/uL (ref 0.00–0.07)
BASOS ABS: 0 10*3/uL (ref 0.0–0.1)
BASOS PCT: 0 %
EOS PCT: 0 %
Eosinophils Absolute: 0 10*3/uL (ref 0.0–0.5)
HCT: 47.4 % — ABNORMAL HIGH (ref 36.0–46.0)
Hemoglobin: 15.3 g/dL — ABNORMAL HIGH (ref 12.0–15.0)
Immature Granulocytes: 1 %
LYMPHS PCT: 16 %
Lymphs Abs: 1.8 10*3/uL (ref 0.7–4.0)
MCH: 35.8 pg — AB (ref 26.0–34.0)
MCHC: 32.3 g/dL (ref 30.0–36.0)
MCV: 111 fL — ABNORMAL HIGH (ref 80.0–100.0)
MONO ABS: 1.1 10*3/uL — AB (ref 0.1–1.0)
Monocytes Relative: 10 %
NEUTROS ABS: 7.9 10*3/uL — AB (ref 1.7–7.7)
NRBC: 0 % (ref 0.0–0.2)
Neutrophils Relative %: 73 %
PLATELETS: 164 10*3/uL (ref 150–400)
RBC: 4.27 MIL/uL (ref 3.87–5.11)
RDW: 13.7 % (ref 11.5–15.5)
WBC: 10.9 10*3/uL — AB (ref 4.0–10.5)

## 2018-03-14 LAB — URINALYSIS, ROUTINE W REFLEX MICROSCOPIC
Bacteria, UA: NONE SEEN
Bilirubin Urine: NEGATIVE
Glucose, UA: NEGATIVE mg/dL
Ketones, ur: 5 mg/dL — AB
Leukocytes, UA: NEGATIVE
Nitrite: NEGATIVE
Protein, ur: >=300 mg/dL — AB
Specific Gravity, Urine: 1.022 (ref 1.005–1.030)
pH: 6 (ref 5.0–8.0)

## 2018-03-14 LAB — BASIC METABOLIC PANEL
ANION GAP: 16 — AB (ref 5–15)
BUN: 45 mg/dL — AB (ref 8–23)
CHLORIDE: 96 mmol/L — AB (ref 98–111)
CO2: 27 mmol/L (ref 22–32)
Calcium: 9.6 mg/dL (ref 8.9–10.3)
Creatinine, Ser: 1.23 mg/dL — ABNORMAL HIGH (ref 0.44–1.00)
GFR calc Af Amer: 44 mL/min — ABNORMAL LOW (ref >60)
GFR, EST NON AFRICAN AMERICAN: 38 mL/min — AB (ref >60)
GLUCOSE: 136 mg/dL — AB (ref 70–99)
POTASSIUM: 4.5 mmol/L (ref 3.5–5.1)
Sodium: 139 mmol/L (ref 135–145)

## 2018-03-14 LAB — PROTIME-INR
INR: 1.99
Prothrombin Time: 22.3 seconds — ABNORMAL HIGH (ref 11.4–15.2)

## 2018-03-14 LAB — I-STAT TROPONIN, ED: Troponin i, poc: 0.07 ng/mL (ref 0.00–0.08)

## 2018-03-14 LAB — INFLUENZA PANEL BY PCR (TYPE A & B)
Influenza A By PCR: NEGATIVE
Influenza B By PCR: NEGATIVE

## 2018-03-14 LAB — BRAIN NATRIURETIC PEPTIDE: B NATRIURETIC PEPTIDE 5: 1095.2 pg/mL — AB (ref 0.0–100.0)

## 2018-03-14 MED ORDER — ALPRAZOLAM 0.5 MG PO TABS
0.5000 mg | ORAL_TABLET | Freq: Every day | ORAL | Status: DC
  Administered 2018-03-14: 0.5 mg via ORAL
  Filled 2018-03-14: qty 1

## 2018-03-14 MED ORDER — WARFARIN SODIUM 2 MG PO TABS: 2.0000 mg | ORAL_TABLET | Freq: Once | ORAL | Status: DC

## 2018-03-14 MED ORDER — IPRATROPIUM BROMIDE 0.02 % IN SOLN
0.5000 mg | Freq: Three times a day (TID) | RESPIRATORY_TRACT | Status: DC
  Administered 2018-03-14 – 2018-03-15 (×3): 0.5 mg via RESPIRATORY_TRACT
  Filled 2018-03-14 (×3): qty 2.5

## 2018-03-14 MED ORDER — ACETAMINOPHEN 500 MG PO TABS
1000.0000 mg | ORAL_TABLET | Freq: Every day | ORAL | Status: DC
  Administered 2018-03-14: 1000 mg via ORAL
  Filled 2018-03-14: qty 2

## 2018-03-14 MED ORDER — ENSURE ENLIVE PO LIQD
237.0000 mL | Freq: Two times a day (BID) | ORAL | Status: DC
  Administered 2018-03-14: 237 mL via ORAL

## 2018-03-14 MED ORDER — FUROSEMIDE 40 MG PO TABS
40.0000 mg | ORAL_TABLET | Freq: Once | ORAL | Status: AC
  Administered 2018-03-14: 40 mg via ORAL
  Filled 2018-03-14: qty 1

## 2018-03-14 MED ORDER — LEVALBUTEROL HCL 1.25 MG/0.5ML IN NEBU
1.2500 mg | INHALATION_SOLUTION | Freq: Three times a day (TID) | RESPIRATORY_TRACT | Status: DC
  Administered 2018-03-14 – 2018-03-15 (×3): 1.25 mg via RESPIRATORY_TRACT
  Filled 2018-03-14 (×3): qty 0.5

## 2018-03-14 MED ORDER — METHYLPREDNISOLONE SODIUM SUCC 125 MG IJ SOLR
80.0000 mg | Freq: Once | INTRAMUSCULAR | Status: AC
  Administered 2018-03-14: 80 mg via INTRAVENOUS
  Filled 2018-03-14: qty 2

## 2018-03-14 MED ORDER — METOPROLOL SUCCINATE ER 50 MG PO TB24
50.0000 mg | ORAL_TABLET | Freq: Every day | ORAL | Status: DC
  Administered 2018-03-15: 50 mg via ORAL
  Filled 2018-03-14: qty 1

## 2018-03-14 MED ORDER — ALLOPURINOL 100 MG PO TABS
100.0000 mg | ORAL_TABLET | Freq: Every day | ORAL | Status: DC
  Administered 2018-03-14: 100 mg via ORAL
  Filled 2018-03-14: qty 1

## 2018-03-14 MED ORDER — METOPROLOL SUCCINATE ER 50 MG PO TB24
50.0000 mg | ORAL_TABLET | Freq: Once | ORAL | Status: AC
  Administered 2018-03-14: 50 mg via ORAL
  Filled 2018-03-14: qty 1

## 2018-03-14 MED ORDER — IPRATROPIUM-ALBUTEROL 0.5-2.5 (3) MG/3ML IN SOLN
3.0000 mL | Freq: Once | RESPIRATORY_TRACT | Status: AC
  Administered 2018-03-14: 3 mL via RESPIRATORY_TRACT
  Filled 2018-03-14: qty 3

## 2018-03-14 MED ORDER — GUAIFENESIN ER 600 MG PO TB12
600.0000 mg | ORAL_TABLET | Freq: Two times a day (BID) | ORAL | Status: DC
  Administered 2018-03-14 – 2018-03-15 (×2): 600 mg via ORAL
  Filled 2018-03-14 (×2): qty 1

## 2018-03-14 MED ORDER — SODIUM CHLORIDE 0.9 % IV BOLUS
500.0000 mL | Freq: Once | INTRAVENOUS | Status: AC
  Administered 2018-03-14: 500 mL via INTRAVENOUS

## 2018-03-14 MED ORDER — DEXTROMETHORPHAN POLISTIREX ER 30 MG/5ML PO SUER
30.0000 mg | Freq: Two times a day (BID) | ORAL | Status: DC | PRN
  Administered 2018-03-14 – 2018-03-15 (×2): 30 mg via ORAL
  Filled 2018-03-14 (×3): qty 5

## 2018-03-14 MED ORDER — WARFARIN - PHARMACIST DOSING INPATIENT: Freq: Every day | Status: DC

## 2018-03-14 MED ORDER — WARFARIN SODIUM 2 MG PO TABS
2.0000 mg | ORAL_TABLET | Freq: Once | ORAL | Status: AC
  Administered 2018-03-14: 2 mg via ORAL
  Filled 2018-03-14: qty 1

## 2018-03-14 MED ORDER — DOXYCYCLINE HYCLATE 100 MG PO TABS
100.0000 mg | ORAL_TABLET | Freq: Two times a day (BID) | ORAL | Status: DC
  Administered 2018-03-14 – 2018-03-15 (×2): 100 mg via ORAL
  Filled 2018-03-14 (×2): qty 1

## 2018-03-14 MED ORDER — ENSURE ENLIVE PO LIQD: 237.0000 mL | Freq: Two times a day (BID) | ORAL | Status: DC

## 2018-03-14 NOTE — Progress Notes (Signed)
Went to do nursing admission history. Family member stopped me and states that they had discussed with the doctor that patient may go back to [facility]. She says they do not know what is wrong with patient and she is not getting any iv fluids and she does not understand why patient can not go back to [facility]. Nursing assistant paged nurse to go to room.  Lucius Conn BSN, RN-BC Admissions RN 03/14/2018 3:51 PM

## 2018-03-14 NOTE — ED Notes (Signed)
Bed: WA06 Expected date:  Expected time:  Means of arrival:  Comments: EMS 91 cough

## 2018-03-14 NOTE — H&P (Signed)
History and Physical  [name] XHB:[id number] DOB: Mar 02, 1925 DOA: 03/14/2018  Referring physician: EDP PCP: Shon Baton, MD   Chief Complaint: sob, productive cough for a week,   HPI: [name] is a 85 y.o. female   [facility]/o COPD (o2 at night prn at baseline), PHTN (though secondary to sever chronic lung disease), chronic on coumadin, left sided breast cancer s/p lumpectomy presented to the ED due to above complaints.  Per EMS patient has rhonchi bilaterally with dark brownish sputum.  Patient report increased weakness, she has been mostly in bed for the last week.   ED course: O2 sat is 84% on room air, blood pressure 147/96, no fever, no tachycardia, chest x-ray no acute findings, flu swab negative, EKG in A. Fib, WBC 10.9, hgb15.3.  BUN 45, creatinine 1.23, INR 1.99.  troponin 0.07, BNP 1095.  She received nebs and Solu-Medrol in the ED, hospitalist requested to further manage the patient.  Review of Systems:  Detail per HPI, Review of systems are otherwise negative  Past Medical History:  Diagnosis Date  . Breast cancer ([place])    left   . Chronic anticoagulation   . Chronic atrial fibrillation   . Hypertension   . Pelvic fracture ([place]) 05/2016   Past Surgical History:  Procedure Laterality Date  . ABDOMINAL HYSTERECTOMY    . BREAST LUMPECTOMY     Social History:  reports that she has quit smoking. Her smoking use included cigarettes. She has a 1.00 pack-year smoking history. She has never used smokeless tobacco. She reports current alcohol use of about 2.0 standard drinks of alcohol per week. She reports that she does not use drugs. Patient lives at  [facility] is able to participate in activities of daily living with assistance  Allergies  Allergen Reactions  . Aspirin     Upset stomach  . Codeine Nausea Only    Nausea     Family History  Problem Relation Age of Onset  . Heart disease Mother   . Ulcers Father   . Colon cancer Father    . Asthma Sister   . Asthma Son       Prior to Admission medications   Medication Sig Start Date End Date Taking? Authorizing Provider  acetaminophen (TYLENOL) 500 MG tablet Take 1,000 mg by mouth at bedtime.   Yes [provider]  allopurinol (ZYLOPRIM) 100 MG tablet Take 100 mg by mouth at bedtime.  03/16/16  Yes [provider]  ALPRAZolam Duanne Moron) 0.5 MG tablet Take 0.5 mg by mouth at bedtime.  05/18/13  Yes [provider]  CALCIUM PO Take 1 tablet by mouth daily.   Yes [provider]  dextromethorphan (DELSYM) 30 MG/5ML liquid Take 30 mg by mouth every 12 (twelve) hours as needed for cough.   Yes [provider]  furosemide (LASIX) 40 MG tablet TAKE 2 TABLETS BY MOUTH ONCE DAILY. Patient taking differently: Take 80 mg by mouth daily.  10/20/17  Yes Croitoru, Mihai, MD  guaiFENesin (MUCINEX) 600 MG 12 hr tablet Take 600 mg by mouth 2 (two) times daily.   Yes [provider]  metoprolol succinate (TOPROL-XL) 50 MG 24 hr tablet Take 1 tablet (50 mg total) by mouth daily. 07/19/17  Yes Croitoru, Mihai, MD  potassium chloride (K-DUR) 10 MEQ tablet Take 1 tablet (10 mEq total) by mouth daily. 07/19/17  Yes Croitoru, Mihai, MD  warfarin (COUMADIN) 2 MG tablet Take 2-4 mg by mouth daily. Pt takes one  tablet daily (2 mg) daily except for on Thursday pt takes two tablets (4 mg)   Yes [provider]    Physical Exam: BP (!) 143/99   Pulse 97   Temp 97.6 F (36.4 C) (Oral)   Resp 20   SpO2 97%   General:  NAD, AAOX3 Eyes: PERRL ENT: unremarkable Neck: supple, no JVD Cardiovascular: IRRR Respiratory: bilateral rhonchi, no wheezing Abdomen: soft/NT/ND, positive bowel sounds Skin: chronic skin lesions on chest ( followed by dermatology) Musculoskeletal:  No edema, chronic venous stasis skin changes  Psychiatric: calm/cooperative Neurologic: no focal findings            Labs on Admission:  Basic Metabolic Panel: Recent  Labs  Lab 03/14/18 1407  NA 139  K 4.5  CL 96*  CO2 27  GLUCOSE 136*  BUN 45*  CREATININE 1.23*  CALCIUM 9.6   Liver Function Tests: No results for input(s): AST, ALT, ALKPHOS, BILITOT, PROT, ALBUMIN in the last 168 hours. No results for input(s): LIPASE, AMYLASE in the last 168 hours. No results for input(s): AMMONIA in the last 168 hours. CBC: Recent Labs  Lab 03/14/18 1407  WBC 10.9*  NEUTROABS 7.9*  HGB 15.3*  HCT 47.4*  MCV 111.0*  PLT 164   Cardiac Enzymes: No results for input(s): CKTOTAL, CKMB, CKMBINDEX, TROPONINI in the last 168 hours.  BNP (last 3 results) Recent Labs    11/18/17 1259 03/14/18 1407  BNP 902.1* 1,095.2*    ProBNP (last 3 results) No results for input(s): PROBNP in the last 8760 hours.  CBG: No results for input(s): GLUCAP in the last 168 hours.  Radiological Exams on Admission: Dg Chest 2 View  Result Date: 03/14/2018 CLINICAL DATA:  Cough. EXAM: CHEST - 2 VIEW COMPARISON:  Radiographs of November 19, 2017. FINDINGS: Stable cardiomegaly. No pneumothorax or pleural effusion is noted. Both lungs are clear. The visualized skeletal structures are unremarkable. IMPRESSION: No active cardiopulmonary disease. Electronically Signed   By: Marijo Conception, M.D.   On: 03/14/2018 13:40    EKG: Independently reviewed. Afib, no acute st/t changes  Assessment/Plan Present on Admission: . Acute hypoxemic respiratory failure (HCC)    Acute bronchitis/COPD exacerbation/acute hypoxic respiratory failure: -Chest x-ray no acute findings, flu swab negative -she does has productive cough, rhonchi bilaterally , no fever, -s/p solumedrol In the ED, will start prednisone 37m daily, nebs TID, mucinex, oral doxycycline, flutter valves , -collect sputum sample for culture, continue oxygen supplement, wean as tolerated  CKDIII -baseline bun/cr 22/1.12 -bun/cr on presentation slightly elevated from baseline at 45/1.23 -likely due to dehydration,  reports poor oral intake in the last three days when she feel sick. -hold lasix, encourage oral intake, renal dosing meds  Chronic AFIB:  Rate controlled, continue home meds metoprolol and coumadin Monitor INR while on doxycycline  Pulmonary hypertension: Clinically dry, hold lasix , encourage oral intake, will avoid aggressive hydration in the setting of PHTN.   DVT prophylaxis: coumadin   Consultants: none  Code Status: full ,verified with daughter over the phone, son at  Bed side  Family Communication:  Patient , daughter over the phone, son at  Bed side  Disposition Plan: med surg obs  Time spent: 777ms  FaFlorencia ReasonsD, PhD [facility] Pager [phone] 7PM-7AM, please contact night-coverage at [email or web address], password [email or web address]

## 2018-03-14 NOTE — ED Provider Notes (Signed)
[facility] DEPT Provider Note   CSN: [id number] Arrival date & time: 03/14/18  1237     History   Chief Complaint Chief Complaint  Patient presents with  . Cough    HPI [name] is a 85 y.o. female.  HPI   [name] is a 85 y.o. female, with a history of pulmonary hypertension, COPD, HTN, and A. fib anticoagulated with warfarin, presenting to the ED with productive cough for the past week. Accompanied by shortness of breath, especially with exertion and long with generalized weakness, fatigue, and malaise.   Denies fever, chest pain, abdominal pain, N/V/D, urinary symptoms, or any other complaints.     Past Medical History:  Diagnosis Date  . Breast cancer ([place])    left   . Chronic anticoagulation   . Chronic atrial fibrillation   . Hypertension   . Pelvic fracture ([place]) 05/2016    Patient Active Problem List   Diagnosis Date Noted  . Acute hypoxemic respiratory failure ([place]) 03/14/2018  . Longstanding persistent atrial fibrillation 12/23/2017  . Centrilobular emphysema ([place]) 12/23/2017  . Acute respiratory failure with hypoxia ([place]) 11/18/2017  . PAH (pulmonary artery hypertension) ([place]) 03/25/2017  . COPD with acute bronchitis ([place]) 03/25/2017  . Hypertension   . Breast cancer ([place])   . Constipation 07/10/2016  . Multiple closed fractures of pelvis with stable disruption of pelvic circle ([place]) 06/16/2016  . Closed nondisplaced fracture of fifth metacarpal bone of left hand 06/16/2016  . Pelvic fracture ([place]) 05/31/2016  . Dyspnea on exertion 11/06/2014  . Orthostasis 11/06/2014  . Chest tightness 06/13/2013  . Essential hypertension 06/13/2013  . Cor pulmonale, chronic ([place]) 06/13/2013  . Chronic atrial fibrillation ([place]) 06/13/2013  . Long term (current) use of anticoagulants 06/13/2013    Past Surgical History:  Procedure Laterality Date  . ABDOMINAL HYSTERECTOMY    . BREAST LUMPECTOMY       OB History    No obstetric history on file.      Home Medications    Prior to Admission medications   Medication Sig Start Date End Date Taking? Authorizing Provider  acetaminophen (TYLENOL) 500 MG tablet Take 1,000 mg by mouth at bedtime.   Yes [provider]  allopurinol (ZYLOPRIM) 100 MG tablet Take 100 mg by mouth at bedtime.  03/16/16  Yes [provider]  ALPRAZolam Duanne Moron) 0.5 MG tablet Take 0.5 mg by mouth at bedtime.  05/18/13  Yes [provider]  CALCIUM PO Take 1 tablet by mouth daily.   Yes [provider]  dextromethorphan (DELSYM) 30 MG/5ML liquid Take 30 mg by mouth every 12 (twelve) hours as needed for cough.   Yes [provider]  furosemide (LASIX) 40 MG tablet TAKE 2 TABLETS BY MOUTH ONCE DAILY. Patient taking differently: Take 80 mg by mouth daily.  10/20/17  Yes Croitoru, Mihai, MD  guaiFENesin (MUCINEX) 600 MG 12 hr tablet Take 600 mg by mouth 2 (two) times daily.   Yes [provider]  metoprolol succinate (TOPROL-XL) 50 MG 24 hr tablet Take 1 tablet (50 mg total) by mouth daily. 07/19/17  Yes Croitoru, Mihai, MD  potassium chloride (K-DUR) 10 MEQ tablet Take 1 tablet (10 mEq total) by mouth daily. 07/19/17  Yes Croitoru, Mihai, MD  warfarin (COUMADIN) 2 MG tablet Take 2-4 mg by mouth daily. Pt takes one tablet daily (2 mg) daily except for on Thursday pt takes two tablets (4 mg)   Yes [provider]  Family History Family History  Problem Relation Age of Onset  . Heart disease Mother   . Ulcers Father   . Colon cancer Father   . Asthma Sister   . Asthma Son     Social History Social History   Tobacco Use  . Smoking status: Former Smoker    Packs/day: 0.20    Years: 5.00    Pack years: 1.00    Types: Cigarettes  . Smokeless tobacco: Never Used  . Tobacco comment: pt has not smoked in 50 years  Substance Use Topics  . Alcohol use: Yes    Alcohol/week: 2.0 standard drinks    Types: 2 Glasses of wine  per week  . Drug use: No     Allergies   Aspirin and Codeine   Review of Systems Review of Systems  Constitutional: Positive for fatigue. Negative for chills, diaphoresis and fever.  Respiratory: Positive for cough and shortness of breath.   Cardiovascular: Negative for chest pain and leg swelling.  Gastrointestinal: Negative for abdominal pain, diarrhea, nausea and vomiting.  Genitourinary: Negative for dysuria, frequency and hematuria.  Neurological: Positive for weakness.  All other systems reviewed and are negative.    Physical Exam Updated Vital Signs BP (!) 147/96 (BP Location: Right Arm) Comment: Simultaneous filing. User may not have seen previous data.  Pulse 93   Temp 97.6 F (36.4 C) (Oral)   Resp 17   SpO2 100%   Physical Exam Vitals signs and nursing note reviewed.  Constitutional:      General: She is not in acute distress.    Appearance: She is well-developed. She is not diaphoretic.  HENT:     Head: Normocephalic and atraumatic.     Mouth/Throat:     Mouth: Mucous membranes are moist.     Pharynx: Oropharynx is clear.  Eyes:     Conjunctiva/sclera: Conjunctivae normal.  Neck:     Musculoskeletal: Neck supple.  Cardiovascular:     Rate and Rhythm: Normal rate and regular rhythm.     Pulses: Normal pulses.     Heart sounds: Normal heart sounds.  Pulmonary:     Effort: Respiratory distress present.     Breath sounds: Wheezing and rhonchi present.     Comments: RA SPO2 84% Abdominal:     Palpations: Abdomen is soft.     Tenderness: There is no abdominal tenderness. There is no guarding.  Musculoskeletal:     Right lower leg: No edema.     Left lower leg: No edema.  Lymphadenopathy:     Cervical: No cervical adenopathy.  Skin:    General: Skin is warm and dry.     Comments: Poor skin turgor  Neurological:     Mental Status: She is alert.  Psychiatric:        Mood and Affect: Mood and affect normal.        Speech: Speech normal.         Behavior: Behavior normal.      ED Treatments / Results  Labs (all labs ordered are listed, but only abnormal results are displayed) Labs Reviewed  BASIC METABOLIC PANEL - Abnormal; Notable for the following components:      Result Value   Chloride 96 (*)    Glucose, Bld 136 (*)    BUN 45 (*)    Creatinine, Ser 1.23 (*)    GFR calc non Af Amer 38 (*)    GFR calc Af Amer 44 (*)    Anion  gap 16 (*)    All other components within normal limits  BRAIN NATRIURETIC PEPTIDE - Abnormal; Notable for the following components:   B Natriuretic Peptide 1,095.2 (*)    All other components within normal limits  CBC WITH DIFFERENTIAL/PLATELET - Abnormal; Notable for the following components:   WBC 10.9 (*)    Hemoglobin 15.3 (*)    HCT 47.4 (*)    MCV 111.0 (*)    MCH 35.8 (*)    Neutro Abs 7.9 (*)    Monocytes Absolute 1.1 (*)    All other components within normal limits  PROTIME-INR - Abnormal; Notable for the following components:   Prothrombin Time 22.3 (*)    All other components within normal limits  INFLUENZA PANEL BY PCR (TYPE A & B)  URINALYSIS, ROUTINE W REFLEX MICROSCOPIC  CBC  PROTIME-INR  COMPREHENSIVE METABOLIC PANEL  I-STAT TROPONIN, ED    EKG EKG Interpretation  Date/Time:  Monday March 14 2018 14:09:42 EST Ventricular Rate:  101 PR Interval:    QRS Duration: 129 QT Interval:  401 QTC Calculation: 520 R Axis:   -83 Text Interpretation:  Atrial fibrillation Nonspecific IVCD with LAD Left ventricular hypertrophy Anterior infarct, old Nonspecific T abnormalities, lateral leads No acute changes Confirmed by Varney Biles ([id number]) on 03/14/2018 3:09:32 PM   Radiology Dg Chest 2 View  Result Date: 03/14/2018 CLINICAL DATA:  Cough. EXAM: CHEST - 2 VIEW COMPARISON:  Radiographs of November 19, 2017. FINDINGS: Stable cardiomegaly. No pneumothorax or pleural effusion is noted. Both lungs are clear. The visualized skeletal structures are unremarkable. IMPRESSION: No  active cardiopulmonary disease. Electronically Signed   By: Marijo Conception, M.D.   On: 03/14/2018 13:40    Procedures Procedures (including critical care time)  Medications Ordered in ED Medications  Warfarin - Pharmacist Dosing Inpatient ( Does not apply Canceled Entry 03/14/18 1746)  guaiFENesin (MUCINEX) 12 hr tablet 600 mg (has no administration in time range)  levalbuterol (XOPENEX) nebulizer solution 1.25 mg (1.25 mg Nebulization Not Given 03/14/18 1637)  ipratropium (ATROVENT) nebulizer solution 0.5 mg (0.5 mg Nebulization Not Given 03/14/18 1636)  acetaminophen (TYLENOL) tablet 1,000 mg (has no administration in time range)  allopurinol (ZYLOPRIM) tablet 100 mg (has no administration in time range)  ALPRAZolam (XANAX) tablet 0.5 mg (has no administration in time range)  dextromethorphan (DELSYM) 30 MG/5ML liquid 30 mg (has no administration in time range)  metoprolol succinate (TOPROL-XL) 24 hr tablet 50 mg (has no administration in time range)  ipratropium-albuterol (DUONEB) 0.5-2.5 (3) MG/3ML nebulizer solution 3 mL (3 mLs Nebulization Given 03/14/18 1416)  methylPREDNISolone sodium succinate (SOLU-MEDROL) 125 mg/2 mL injection 80 mg (80 mg Intravenous Given 03/14/18 1415)  sodium chloride 0.9 % bolus 500 mL (0 mLs Intravenous Stopped 03/14/18 1518)  metoprolol succinate (TOPROL-XL) 24 hr tablet 50 mg (50 mg Oral Given 03/14/18 1519)  furosemide (LASIX) tablet 40 mg (40 mg Oral Given 03/14/18 1518)  warfarin (COUMADIN) tablet 2 mg (2 mg Oral Given 03/14/18 1725)     Initial Impression / Assessment and Plan / ED Course  I have reviewed the triage vital signs and the nursing notes.  Pertinent labs & imaging results that were available during my care of the patient were reviewed by me and considered in my medical decision making (see chart for details).  Clinical Course as of Mar 14 1757  Mon Mar 14, 2018  1518 Patient has no rales or peripheral edema on exam and no pulmonary edema on  chest  x-ray.  B Natriuretic Peptide(!): 1,095.2 [SJ]  2518 Spoke with Dr. Erlinda Hong, hospitalist. Agrees to admit.   [SJ]    Clinical Course User Index [SJ] ,  C, PA-C    Patient presents with cough and shortness of breath for the past week.  Patient with increased work of breathing, hypoxia, and new oxygen requirement.  No acute abnormalities on chest x-ray.  No evidence of fluid overload on exam. Suspicion for COPD exacerbation.  Admitted for further treatment.    Findings and plan of care discussed with Bjorn Pippin, MD. Dr. Kathrynn Humble personally evaluated and examined this patient.   Vitals:   03/14/18 1335 03/14/18 1338 03/14/18 1340 03/14/18 1430  BP:  (!) 152/96  (!) 152/87  Pulse: 99 92 92 92  Resp:  20  15  Temp:      TempSrc:      SpO2: (!) 84% 94% 99% 99%     Final Clinical Impressions(s) / ED Diagnoses   Final diagnoses:  COPD exacerbation [facility])    ED Discharge Orders    None       Lorayne Bender, PA-C 03/14/18 1800    Varney Biles, MD 03/15/18 [phone]

## 2018-03-14 NOTE — Progress Notes (Signed)
ANTICOAGULATION CONSULT NOTE - Initial Consult  Pharmacy Consult for warfarin Indication: atrial fibrillation  Allergies  Allergen Reactions  . Aspirin     Upset stomach  . Codeine Nausea Only    Nausea     Patient Measurements:    Vital Signs: Temp: 97.6 F (36.4 C) (01/13 1254) Temp Source: Oral (01/13 1254) BP: 152/87 (01/13 1430) Pulse Rate: 92 (01/13 1430)  Labs: Recent Labs    03/14/18 1407  HGB 15.3*  HCT 47.4*  PLT 164  LABPROT 22.3*  INR 1.99  CREATININE 1.23*    CrCl cannot be calculated (Unknown ideal weight.).   Medical History: Past Medical History:  Diagnosis Date  . Breast cancer ([place])    left   . Chronic anticoagulation   . Chronic atrial fibrillation   . Hypertension   . Pelvic fracture ([place]) 05/2016   Patient taking warfarin PTA.  Assessment: Pharmacy consulted to dose and monitor warfarin in this 85 year old female. Pt was taking warfarin PTA for afib.   INR on admission = 2 is therapeutic  Home warfarin dose: 2 mg PO daily except 4 mg PO on Thursday per med rec   Baseline CBC: Hgb 15.3, Plt 164  Today, 03/14/18  INR 2 (therapeutic)  CBC WNL  No new medications ordered that significantly interact with warfarin  Goal of Therapy:  INR 2-3 Monitor platelets by anticoagulation protocol: Yes   Plan:   Will continue with home warfarin dose of 2 mg PO this evening  INR daily while inpatient  CBC with AM labs tomorrow  Lenis Noon, PharmD 03/14/2018,2:51 PM

## 2018-03-14 NOTE — ED Triage Notes (Addendum)
Patient arrived via [facility] from [facility]  Patient c/o cough x 1 week.   Rhonchi in all fields per EMS   Dark/brownish sputum  Patient states she has felt increasingly bad over the week.    98% on 2L with EMS Patient uses PRN 02 at night   HR--  100-110 BP-160/104 A/Ox4  Denies fever-98s with nursing facility   Hx. A.fibb, Breast Cancer    EMS think pneumonia

## 2018-03-14 NOTE — ED Notes (Signed)
Report given to Andee Poles, RN.

## 2018-03-15 DIAGNOSIS — I272 Pulmonary hypertension, unspecified: Secondary | ICD-10-CM | POA: Diagnosis not present

## 2018-03-15 DIAGNOSIS — Z7401 Bed confinement status: Secondary | ICD-10-CM | POA: Diagnosis not present

## 2018-03-15 DIAGNOSIS — N183 Chronic kidney disease, stage 3 (moderate): Secondary | ICD-10-CM | POA: Diagnosis not present

## 2018-03-15 DIAGNOSIS — J9621 Acute and chronic respiratory failure with hypoxia: Secondary | ICD-10-CM

## 2018-03-15 DIAGNOSIS — J441 Chronic obstructive pulmonary disease with (acute) exacerbation: Secondary | ICD-10-CM | POA: Diagnosis not present

## 2018-03-15 DIAGNOSIS — M2550 Pain in unspecified joint: Secondary | ICD-10-CM | POA: Diagnosis not present

## 2018-03-15 DIAGNOSIS — Z7901 Long term (current) use of anticoagulants: Secondary | ICD-10-CM | POA: Diagnosis not present

## 2018-03-15 DIAGNOSIS — I482 Chronic atrial fibrillation, unspecified: Secondary | ICD-10-CM | POA: Diagnosis not present

## 2018-03-15 LAB — COMPREHENSIVE METABOLIC PANEL
ALBUMIN: 3.4 g/dL — AB (ref 3.5–5.0)
ALT: 20 U/L (ref 0–44)
AST: 31 U/L (ref 15–41)
Alkaline Phosphatase: 81 U/L (ref 38–126)
Anion gap: 12 (ref 5–15)
BUN: 51 mg/dL — ABNORMAL HIGH (ref 8–23)
CO2: 26 mmol/L (ref 22–32)
Calcium: 8.9 mg/dL (ref 8.9–10.3)
Chloride: 97 mmol/L — ABNORMAL LOW (ref 98–111)
Creatinine, Ser: 1.05 mg/dL — ABNORMAL HIGH (ref 0.44–1.00)
GFR calc Af Amer: 54 mL/min — ABNORMAL LOW (ref >60)
GFR calc non Af Amer: 46 mL/min — ABNORMAL LOW (ref >60)
Glucose, Bld: 141 mg/dL — ABNORMAL HIGH (ref 70–99)
Potassium: 4.2 mmol/L (ref 3.5–5.1)
Sodium: 135 mmol/L (ref 135–145)
Total Bilirubin: 1.2 mg/dL (ref 0.3–1.2)
Total Protein: 6.3 g/dL — ABNORMAL LOW (ref 6.5–8.1)

## 2018-03-15 LAB — CBC
HCT: 40.6 % (ref 36.0–46.0)
Hemoglobin: 13.5 g/dL (ref 12.0–15.0)
MCH: 36.3 pg — AB (ref 26.0–34.0)
MCHC: 33.3 g/dL (ref 30.0–36.0)
MCV: 109.1 fL — ABNORMAL HIGH (ref 80.0–100.0)
Platelets: 146 10*3/uL — ABNORMAL LOW (ref 150–400)
RBC: 3.72 MIL/uL — AB (ref 3.87–5.11)
RDW: 13.5 % (ref 11.5–15.5)
WBC: 10.5 10*3/uL (ref 4.0–10.5)
nRBC: 0 % (ref 0.0–0.2)

## 2018-03-15 LAB — PROTIME-INR
INR: 2.06
Prothrombin Time: 22.9 seconds — ABNORMAL HIGH (ref 11.4–15.2)

## 2018-03-15 MED ORDER — PREDNISONE 20 MG PO TABS: 40.0000 mg | ORAL_TABLET | Freq: Every day | ORAL | 0 refills | Status: AC

## 2018-03-15 MED ORDER — LEVALBUTEROL HCL 1.25 MG/0.5ML IN NEBU: 1.2500 mg | INHALATION_SOLUTION | Freq: Three times a day (TID) | RESPIRATORY_TRACT | 12 refills | Status: AC

## 2018-03-15 MED ORDER — GUAIFENESIN 100 MG/5ML PO SOLN: 5.0000 mL | ORAL | Status: DC | PRN

## 2018-03-15 MED ORDER — DOXYCYCLINE HYCLATE 100 MG PO TABS: 100.0000 mg | ORAL_TABLET | Freq: Two times a day (BID) | ORAL | 0 refills | Status: AC

## 2018-03-15 MED ORDER — WARFARIN SODIUM 2 MG PO TABS
2.0000 mg | ORAL_TABLET | Freq: Once | ORAL | Status: DC
  Filled 2018-03-15: qty 1

## 2018-03-15 MED ORDER — IPRATROPIUM BROMIDE 0.02 % IN SOLN: 0.5000 mg | Freq: Three times a day (TID) | RESPIRATORY_TRACT | 12 refills | Status: AC

## 2018-03-15 MED ORDER — ENSURE ENLIVE PO LIQD: 237.0000 mL | Freq: Two times a day (BID) | ORAL | 12 refills | Status: DC

## 2018-03-15 MED ORDER — FUROSEMIDE 40 MG PO TABS: 40.0000 mg | ORAL_TABLET | Freq: Every day | ORAL | 0 refills | Status: DC

## 2018-03-15 MED ORDER — POTASSIUM CHLORIDE ER 10 MEQ PO TBCR: 10.0000 meq | EXTENDED_RELEASE_TABLET | ORAL | 3 refills | Status: DC

## 2018-03-15 NOTE — Progress Notes (Signed)
Called report to [facility]. Gave report to RN

## 2018-03-15 NOTE — Clinical Social Work Placement (Addendum)
   Patient from [facility] and returning to SNF there.   LCSW confirmed plan with facility.   Patient will transport by [facility] due to o2  LCSW faxed dc docs to facility.   RN report# [phone]  BKJ    CLINICAL SOCIAL WORK PLACEMENT  NOTE  Date:  03/15/2018  Patient Details  Name: [name] MRN: [id number] Date of Birth: Sep 04, 1924  Clinical Social Work is seeking post-discharge placement for this patient at the   level of care (*CSW will initial, date and re-position this form in  chart as items are completed):      Patient/family provided with [place] Work Department's list of facilities offering this level of care within the geographic area requested by the patient (or if unable, by the patient's family).      Patient/family informed of their freedom to choose among providers that offer the needed level of care, that participate in Medicare, Medicaid or managed care program needed by the patient, have an available bed and are willing to accept the patient.      Patient/family informed of [facility]'s ownership interest in [facility] and [facility] as well as of the fact that they are under no obligation to receive care at these facilities.  PASRR submitted to EDS on       PASRR number received on       Existing PASRR number confirmed on       FL2 transmitted to all facilities in geographic area requested by pt/family on       FL2 transmitted to all facilities within larger geographic area on       Patient informed that his/her managed care company has contracts with or will negotiate with certain facilities, including the following:            Patient/family informed of bed offers received.  Patient chooses bed at       Physician recommends and patient chooses bed at      Patient to be transferred to   on  .  Patient to be transferred to facility by       Patient family notified on   of transfer.  Name of  family member notified:        PHYSICIAN       Additional Comment:    _______________________________________________ Servando Snare, LCSW 03/15/2018, 2:13 PM

## 2018-03-15 NOTE — Evaluation (Signed)
Physical Therapy Evaluation Patient Details Name: [name] MRN: [id number] DOB: May 12, 1924 Today's Date: 03/15/2018   History of Present Illness  85 yo female admitted with acute bronchitis, COPD exac, pulm HTN. Hx of breast ca, COPD-O2 at night, anxiety, gout, A fib  Clinical Impression  On eval, pt required Min assist for mobility. Pt presents with general weakness, decreased activity tolerance, and impaired gait and balance. Pt was able to begin ambulation/gait training on today. O2 sat 92% on RA at rest, 75% on RA with ambulation. She walked ~60 feet with a rollator. Replaced [place] O2 once back in room. Discussed d/c plan during session. Pt is from [place] and she is planning to d/c to the rehab section of [facility] for continued therapy.     Follow Up Recommendations SNF(pt stated she is planning to d/c to [facility] rehab section)    Equipment Recommendations  None recommended by PT    Recommendations for Other Services       Precautions / Restrictions Precautions Precautions: Fall Precaution Comments: monitor O2 Restrictions Weight Bearing Restrictions: No      Mobility  Bed Mobility Overal bed mobility: Needs Assistance Bed Mobility: Supine to Sit     Supine to sit: Supervision;HOB elevated     General bed mobility comments: for safety.  Transfers Overall transfer level: Needs assistance Equipment used: 4-wheeled walker;None Transfers: Sit to/from [facility] Sit to Stand: Min assist Stand pivot transfers: Min assist       General transfer comment: Assist to rise, stabilize, control descent. VCs safety, hand placement. Stand pivot, bed >bsc, with pt holding on to armrests. Unsteady.   Ambulation/Gait Ambulation/Gait assistance: Min assist Gait Distance (Feet): 60 Feet Assistive device: 4-wheeled walker Gait Pattern/deviations: Step-through pattern;Decreased stride length     General Gait Details: Slow gait speed.  Intermittently unsteady. O2 sat dropped to 75% on RA during ambulation. Dyspnea 2/4  Stairs            Wheelchair Mobility    Modified Rankin (Stroke Patients Only)       Balance Overall balance assessment: Needs assistance         Standing balance support: Bilateral upper extremity supported Standing balance-Leahy Scale: Poor                               Pertinent Vitals/Pain Pain Assessment: No/denies pain    Home Living Family/patient expects to be discharged to:: Unsure Living Arrangements: Alone   Type of Home: Independent living facility Home Access: Level entry     Home Layout: One level Home Equipment: Walker - 4 wheels Additional Comments: Pt from apartment at [employer]. All one level and accessible    Prior Function Level of Independence: Independent with assistive device(s)         Comments: Mod indep with rollator. Indep with ADLs. Meals provided; housekeeping provided. Pt does own laundry     Hand Dominance        Extremity/Trunk Assessment   Upper Extremity Assessment Upper Extremity Assessment: Overall WFL for tasks assessed    Lower Extremity Assessment Lower Extremity Assessment: Generalized weakness    Cervical / Trunk Assessment Cervical / Trunk Assessment: Kyphotic  Communication   Communication: HOH  Cognition Arousal/Alertness: Awake/alert Behavior During Therapy: WFL for tasks assessed/performed Overall Cognitive Status: Within Functional Limits for tasks assessed  General Comments      Exercises     Assessment/Plan    PT Assessment Patient needs continued PT services  PT Problem List Decreased strength;Decreased balance;Decreased mobility;Decreased activity tolerance;Cardiopulmonary status limiting activity       PT Treatment Interventions DME instruction;Gait training;Functional mobility training;Therapeutic activities;Balance  training;Patient/family education;Therapeutic exercise    PT Goals (Current goals can be found in the Care Plan section)  Acute Rehab PT Goals Patient Stated Goal: to regain PLOF/independence PT Goal Formulation: With patient Time For Goal Achievement: 03/29/18 Potential to Achieve Goals: Good    Frequency Min 3X/week   Barriers to discharge        Co-evaluation               AM-PAC PT "6 Clicks" Mobility  Outcome Measure Help needed turning from your back to your side while in a flat bed without using bedrails?: None Help needed moving from lying on your back to sitting on the side of a flat bed without using bedrails?: None Help needed moving to and from a bed to a chair (including a wheelchair)?: A Little Help needed standing up from a chair using your arms (e.g., wheelchair or bedside chair)?: A Little Help needed to walk in hospital room?: A Little Help needed climbing 3-5 steps with a railing? : A Lot 6 Click Score: 19    End of Session Equipment Utilized During Treatment: Oxygen;Gait belt Activity Tolerance: Patient tolerated treatment well Patient left: in chair;with call bell/phone within reach;with chair alarm set   PT Visit Diagnosis: Muscle weakness (generalized) (M62.81);Difficulty in walking, not elsewhere classified (R26.2)    Time: 0930-1000 PT Time Calculation (min) (ACUTE ONLY): 30 min   Charges:   PT Evaluation $PT Eval Moderate Complexity: 1 Mod PT Treatments $Gait Training: 8-22 mins         Weston Anna, PT Acute Rehabilitation Services Pager: [phone] Office: [phone]

## 2018-03-15 NOTE — Progress Notes (Addendum)
ANTICOAGULATION CONSULT NOTE - Initial Consult  Pharmacy Consult for warfarin Indication: atrial fibrillation  Allergies  Allergen Reactions  . Aspirin     Upset stomach  . Codeine Nausea Only    Nausea    Patient Measurements:    Vital Signs: Temp: 98.3 F (36.8 C) (01/14 0610) Temp Source: Oral (01/14 0610) BP: 107/79 (01/14 0610) Pulse Rate: 69 (01/14 0610)  Labs: Recent Labs    03/14/18 1407 03/15/18 0820  HGB 15.3* 13.5  HCT 47.4* 40.6  PLT 164 146*  LABPROT 22.3* 22.9*  INR 1.99 2.06  CREATININE 1.23* 1.05*   CrCl cannot be calculated (Unknown ideal weight.).  Medical History: Past Medical History:  Diagnosis Date  . Breast cancer ([place])    left   . Chronic anticoagulation   . Chronic atrial fibrillation   . Hypertension   . Pelvic fracture ([place]) 05/2016   Assessment: Pharmacy consulted to dose and monitor warfarin in this 85 year old female. PTA Warfarin for Afib, 45m daily, except 427mon Thursdays. Last dose 1/12   INR on admission = 2 is therapeutic Baseline CBC: Hgb 15.3, Plt 164  Today, 03/15/18  INR 2.06 (therapeutic)  CBC WNL  Doxycycline may increase INR  Goal of Therapy:  INR 2-3 Monitor platelets by anticoagulation protocol: Yes   Plan:   Will continue with home warfarin dose of 2 mg PO this evening  INR daily while inpatient  CBC with AM labs tomorrow  GrMinda DittoharmD Pager [phone]/14/2020, 11:57 AM

## 2018-03-15 NOTE — NC FL2 (Signed)
[place] LEVEL OF CARE SCREENING TOOL     IDENTIFICATION  Patient Name: [name] Birthdate: 02-Apr-1924 Sex: female Admission Date (Current Location): 03/14/2018  [facility] and [place] Number:  Herbalist and Address:  [facility],  [place] [address] [place]      Provider Number: [id number]  Attending Physician Name and Address:  Florencia Reasons, MD  Relative Name and Phone Number:       Current Level of Care: Hospital Recommended Level of Care: [place] Prior Approval Number:    Date Approved/Denied:   PASRR Number: [id number]  Discharge Plan: SNF    Current Diagnoses: Patient Active Problem List   Diagnosis Date Noted  . Acute hypoxemic respiratory failure ([place]) 03/14/2018  . COPD exacerbation ([place])   . Pulmonary hypertension ([place])   . CKD (chronic kidney disease), stage III ([place])   . Anticoagulated on Coumadin   . Longstanding persistent atrial fibrillation 12/23/2017  . Centrilobular emphysema ([place]) 12/23/2017  . Acute respiratory failure with hypoxia ([place]) 11/18/2017  . PAH (pulmonary artery hypertension) ([place]) 03/25/2017  . COPD with acute bronchitis ([place]) 03/25/2017  . Hypertension   . Breast cancer ([place])   . Constipation 07/10/2016  . Multiple closed fractures of pelvis with stable disruption of pelvic circle ([place]) 06/16/2016  . Closed nondisplaced fracture of fifth metacarpal bone of left hand 06/16/2016  . Pelvic fracture ([place]) 05/31/2016  . Dyspnea on exertion 11/06/2014  . Orthostasis 11/06/2014  . Chest tightness 06/13/2013  . Essential hypertension 06/13/2013  . Cor pulmonale, chronic ([place]) 06/13/2013  . Chronic a-fib 06/13/2013  . Long term (current) use of anticoagulants 06/13/2013    Orientation RESPIRATION BLADDER Height & Weight     Self, Time, Situation, Place  Normal Continent Weight:   Height:     BEHAVIORAL SYMPTOMS/MOOD NEUROLOGICAL BOWEL NUTRITION STATUS      Continent  Diet(See dc summary)  AMBULATORY STATUS COMMUNICATION OF NEEDS Skin   Extensive Assist Verbally Normal                       Personal Care Assistance Level of Assistance  Bathing, Feeding, Dressing Bathing Assistance: Limited assistance Feeding assistance: Independent Dressing Assistance: Limited assistance     Functional Limitations Info  Sight, Hearing, Speech Sight Info: Adequate Hearing Info: Adequate Speech Info: Adequate    SPECIAL CARE FACTORS FREQUENCY  PT (By licensed PT), OT (By licensed OT)     PT Frequency: 5x/week OT Frequency: 5x/week            Contractures Contractures Info: Not present    Additional Factors Info                  Current Medications (03/15/2018):  This is the current hospital active medication list Current Facility-Administered Medications  Medication Dose Route Frequency Provider Last Rate Last Dose  . acetaminophen (TYLENOL) tablet 1,000 mg  1,000 mg Oral QHS Florencia Reasons, MD   1,000 mg at 03/14/18 2058  . allopurinol (ZYLOPRIM) tablet 100 mg  100 mg Oral QHS Florencia Reasons, MD   100 mg at 03/14/18 2059  . ALPRAZolam Duanne Moron) tablet 0.5 mg  0.5 mg Oral QHS Florencia Reasons, MD   0.5 mg at 03/14/18 2059  . dextromethorphan (DELSYM) 30 MG/5ML liquid 30 mg  30 mg Oral Q12H PRN Florencia Reasons, MD   30 mg at 03/15/18 1205  . doxycycline (VIBRA-TABS) tablet 100 mg  100 mg Oral Q12H  Florencia Reasons, MD   100 mg at 03/15/18 1203  . feeding supplement (ENSURE ENLIVE) (ENSURE ENLIVE) liquid 237 mL  237 mL Oral BID BM Florencia Reasons, MD   237 mL at 03/14/18 1935  . guaiFENesin (MUCINEX) 12 hr tablet 600 mg  600 mg Oral BID Florencia Reasons, MD   600 mg at 03/15/18 1203  . ipratropium (ATROVENT) nebulizer solution 0.5 mg  0.5 mg Nebulization TID Florencia Reasons, MD   0.5 mg at 03/15/18 0913  . levalbuterol (XOPENEX) nebulizer solution 1.25 mg  1.25 mg Nebulization TID Florencia Reasons, MD   1.25 mg at 03/15/18 0913  . metoprolol succinate (TOPROL-XL) 24 hr tablet 50 mg  50 mg Oral Daily Florencia Reasons,  MD   50 mg at 03/15/18 1203  . warfarin (COUMADIN) tablet 2 mg  2 mg Oral ONCE-1800 Green, Terri L, RPH      . Warfarin - Pharmacist Dosing Inpatient   Does not apply [id number] Florencia Reasons, MD         Discharge Medications: Please see discharge summary for a list of discharge medications.  Relevant Imaging Results:  Relevant Lab Results:   Additional Information SSN: [id number]  Servando Snare, LCSW

## 2018-03-15 NOTE — Discharge Summary (Signed)
Discharge Summary  [name] WNU:[id number] DOB: [birth year]  PCP: Shon Baton, MD  Admit date: 03/14/2018 Discharge date: 03/15/2018  Time spent: 38mns, more than 50% time spent on coordination of care.  Recommendations for Outpatient Follow-up:  1. F/u with SNF MD for hospital discharge follow up, repeat cbc/bmp at follow up, SNF MD to check INR q3d while on doxycycline  2. F/u with cardiology Dr C  Discharge Diagnoses:  Active Hospital Problems   Diagnosis Date Noted  . Acute hypoxemic respiratory failure ([place] 03/14/2018  . COPD exacerbation ([place]   . Pulmonary hypertension ([place]   . CKD (chronic kidney disease), stage III ([place]   . Anticoagulated on Coumadin   . Chronic a-fib 06/13/2013    Resolved Hospital Problems  No resolved problems to display.    Discharge Condition: stable  Diet recommendation: heart healthy  There were no vitals filed for this visit.  History of present illness:  PCP: RShon Baton MD   Chief Complaint: sob, productive cough for a week,   HPI: [name] a 85y.o. female   [facility] (o2 at night prn at baseline), PHTN (though secondary to sever chronic lung disease), chronic on coumadin, left sided breast cancer s/p lumpectomy presented to the ED due to above complaints.  Per EMS patient has rhonchi bilaterally with dark brownish sputum.  Patient report increased weakness, she has been mostly in bed for the last week.   ED course: O2 sat is 84% on room air, blood pressure 147/96, no fever, no tachycardia, chest x-ray no acute findings, flu swab negative, EKG in A. Fib, WBC 10.9, hgb15.3.  BUN 45, creatinine 1.23, INR 1.99.  troponin 0.07, BNP 1095.  She received nebs and Solu-Medrol in the ED, hospitalist requested to further manage the patient.  Hospital Course:  Active Problems:   Chronic a-fib   Acute hypoxemic respiratory failure (HCC)   COPD exacerbation (HCC)   Pulmonary hypertension (HCC)   CKD  (chronic kidney disease), stage III (HCC)   Anticoagulated on Coumadin   Acute bronchitis/COPD exacerbation/acute on chronic hypoxic respiratory failure (suppose to be on o2 24/7 but she only wears it at night by report): -Chest x-ray no acute findings, flu swab negative -she does has productive cough, rhonchi bilaterally on presentation, no fever, wbc on presentation is 10.9 -s/p solumedrol In the ED, started prednisone 431mdaily, nebs TID, mucinex, oral doxycycline, flutter valves , -sputum sample not collected -she is feeling better with above treatment, lung exam has much improved, no rhonchi, no wheezing, no rales on exam at discharge, she is discharged to SNF with prednisone, doxcycyline, nebs, encourage patient to wear o2 24/7.   CKDIII -baseline bun/cr 22/1.12 -bun/cr on presentation slightly elevated from baseline at 45/1.23 -likely due to dehydration, reports poor oral intake in the last three days when she feel sick. -hold lasix held in the hospital, she did not received ivf due to h/o PHTN, cr improved to 1.05 at discharge - encourage oral intake, renal dosing meds, lasix resumed at lower dose at discharged.  Chronic AFIB:  Rate controlled, continue home meds metoprolol and coumadin Monitor INR while on doxycycline  Pulmonary hypertension: Clinically dry, lasix held in the hospital, encourage oral intake, avoid aggressive hydration in the setting of PHTN. Lasix resumed at discharge at a lower dose, cardiology Dr C made aware of the lasix dose change, Dr C will arrange close follow up.     DVT prophylaxis: coumadin   Consultants: Curbside cardiology  Dr C  Code Status: full ,verified with daughter over the phone, son at  Bed side  Family Communication:  Patient , son at  Bed side  Disposition Plan: discharge to SNF   Procedures:  none   Discharge Exam: BP 109/77   Pulse 76   Temp 98.3 F (36.8 C) (Oral)   Resp 19   SpO2 (!) 86% Comment: placed  back on 2.5L [place]  General: NAD,  Cardiovascular: IRRR Respiratory: improved lung exam, rhonchi has resolved, no wheezing, no rales, improved aeration  Extremity: no edema, chronic venous stasis changes   Discharge Instructions You were cared for by a hospitalist during your hospital stay. If you have any questions about your discharge medications or the care you received while you were in the hospital after you are discharged, you can call the unit and asked to speak with the hospitalist on call if the hospitalist that took care of you is not available. Once you are discharged, your primary care physician will handle any further medical issues. Please note that NO REFILLS for any discharge medications will be authorized once you are discharged, as it is imperative that you return to your primary care physician (or establish a relationship with a primary care physician if you do not have one) for your aftercare needs so that they can reassess your need for medications and monitor your lab values.  Discharge Instructions    Diet - low sodium heart healthy   Complete by:  As directed    Increase activity slowly   Complete by:  As directed      Allergies as of 03/15/2018      Reactions   Aspirin    Upset stomach   Codeine Nausea Only   Nausea      Medication List    TAKE these medications   acetaminophen 500 MG tablet Commonly known as:  TYLENOL Take 1,000 mg by mouth at bedtime.   allopurinol 100 MG tablet Commonly known as:  ZYLOPRIM Take 100 mg by mouth at bedtime.   ALPRAZolam 0.5 MG tablet Commonly known as:  XANAX Take 0.5 mg by mouth at bedtime.   CALCIUM PO Take 1 tablet by mouth daily.   DELSYM 30 MG/5ML liquid Generic drug:  dextromethorphan Take 30 mg by mouth every 12 (twelve) hours as needed for cough.   doxycycline 100 MG tablet Commonly known as:  VIBRA-TABS Take 1 tablet (100 mg total) by mouth every 12 (twelve) hours for 4 days.   feeding supplement  (ENSURE ENLIVE) Liqd Take 237 mLs by mouth 2 (two) times daily between meals.   furosemide 40 MG tablet Commonly known as:  LASIX Take 1 tablet (40 mg total) by mouth daily. What changed:  how much to take   guaiFENesin 600 MG 12 hr tablet Commonly known as:  MUCINEX Take 600 mg by mouth 2 (two) times daily.   ipratropium 0.02 % nebulizer solution Commonly known as:  ATROVENT Take 2.5 mLs (0.5 mg total) by nebulization 3 (three) times daily.   levalbuterol 1.25 MG/0.5ML nebulizer solution Commonly known as:  XOPENEX Take 1.25 mg by nebulization 3 (three) times daily.   metoprolol succinate 50 MG 24 hr tablet Commonly known as:  TOPROL-XL Take 1 tablet (50 mg total) by mouth daily.   potassium chloride 10 MEQ tablet Commonly known as:  K-DUR Take 1 tablet (10 mEq total) by mouth every Monday, Wednesday, and Friday. Start taking on:  March 16, 2018 What changed:  when  to take this   predniSONE 20 MG tablet Commonly known as:  DELTASONE Take 2 tablets (40 mg total) by mouth daily for 3 days.   warfarin 2 MG tablet Commonly known as:  COUMADIN Take 2-4 mg by mouth daily. Pt takes one tablet daily (2 mg) daily except for on Thursday pt takes two tablets (4 mg)      Allergies  Allergen Reactions  . Aspirin     Upset stomach  . Codeine Nausea Only    Nausea    Follow-up Information    Shon Baton, MD Follow up in 1 week(s).   Specialty:  Internal Medicine Why:  hospital discharge follow up. repeat cbc/bmp at follow up. Contact information: [address] [place] [address] [phone]        Sanda Klein, MD .   Specialty:  Cardiology Contact information: [address] [place] [address] [phone]            The results of significant diagnostics from this hospitalization (including imaging, microbiology, ancillary and laboratory) are listed below for reference.    Significant Diagnostic Studies: Dg Chest 2  View  Result Date: 03/14/2018 CLINICAL DATA:  Cough. EXAM: CHEST - 2 VIEW COMPARISON:  Radiographs of November 19, 2017. FINDINGS: Stable cardiomegaly. No pneumothorax or pleural effusion is noted. Both lungs are clear. The visualized skeletal structures are unremarkable. IMPRESSION: No active cardiopulmonary disease. Electronically Signed   By: Marijo Conception, M.D.   On: 03/14/2018 13:40    Microbiology: No results found for this or any previous visit (from the past 240 hour(s)).   Labs: Basic Metabolic Panel: Recent Labs  Lab 03/14/18 1407 03/15/18 0820  NA 139 135  K 4.5 4.2  CL 96* 97*  CO2 27 26  GLUCOSE 136* 141*  BUN 45* 51*  CREATININE 1.23* 1.05*  CALCIUM 9.6 8.9   Liver Function Tests: Recent Labs  Lab 03/15/18 0820  AST 31  ALT 20  ALKPHOS 81  BILITOT 1.2  PROT 6.3*  ALBUMIN 3.4*   No results for input(s): LIPASE, AMYLASE in the last 168 hours. No results for input(s): AMMONIA in the last 168 hours. CBC: Recent Labs  Lab 03/14/18 1407 03/15/18 0820  WBC 10.9* 10.5  NEUTROABS 7.9*  --   HGB 15.3* 13.5  HCT 47.4* 40.6  MCV 111.0* 109.1*  PLT 164 146*   Cardiac Enzymes: No results for input(s): CKTOTAL, CKMB, CKMBINDEX, TROPONINI in the last 168 hours. BNP: BNP (last 3 results) Recent Labs    11/18/17 1259 03/14/18 1407  BNP 902.1* 1,095.2*    ProBNP (last 3 results) No results for input(s): PROBNP in the last 8760 hours.  CBG: No results for input(s): GLUCAP in the last 168 hours.     Signed:  Florencia Reasons MD, PhD  [facility] 03/15/2018, 1:17 PM

## 2018-03-16 DIAGNOSIS — R278 Other lack of coordination: Secondary | ICD-10-CM | POA: Diagnosis not present

## 2018-03-16 DIAGNOSIS — N183 Chronic kidney disease, stage 3 (moderate): Secondary | ICD-10-CM | POA: Diagnosis not present

## 2018-03-16 DIAGNOSIS — R2689 Other abnormalities of gait and mobility: Secondary | ICD-10-CM | POA: Diagnosis not present

## 2018-03-16 DIAGNOSIS — R0609 Other forms of dyspnea: Secondary | ICD-10-CM | POA: Diagnosis not present

## 2018-03-16 DIAGNOSIS — J9601 Acute respiratory failure with hypoxia: Secondary | ICD-10-CM | POA: Diagnosis not present

## 2018-03-16 DIAGNOSIS — M6389 Disorders of muscle in diseases classified elsewhere, multiple sites: Secondary | ICD-10-CM | POA: Diagnosis not present

## 2018-03-16 DIAGNOSIS — R0602 Shortness of breath: Secondary | ICD-10-CM | POA: Diagnosis not present

## 2018-03-16 DIAGNOSIS — I4819 Other persistent atrial fibrillation: Secondary | ICD-10-CM | POA: Diagnosis not present

## 2018-03-16 DIAGNOSIS — J441 Chronic obstructive pulmonary disease with (acute) exacerbation: Secondary | ICD-10-CM | POA: Diagnosis not present

## 2018-03-16 LAB — POCT INR: INR: 2.3 — AB (ref <=1.1)

## 2018-03-16 LAB — PROTIME-INR: Protime: 24.6 — AB (ref 10.0–13.8)

## 2018-03-17 DIAGNOSIS — I4819 Other persistent atrial fibrillation: Secondary | ICD-10-CM | POA: Diagnosis not present

## 2018-03-17 DIAGNOSIS — J441 Chronic obstructive pulmonary disease with (acute) exacerbation: Secondary | ICD-10-CM | POA: Diagnosis not present

## 2018-03-17 DIAGNOSIS — R2689 Other abnormalities of gait and mobility: Secondary | ICD-10-CM | POA: Diagnosis not present

## 2018-03-17 DIAGNOSIS — R0602 Shortness of breath: Secondary | ICD-10-CM | POA: Diagnosis not present

## 2018-03-17 DIAGNOSIS — R278 Other lack of coordination: Secondary | ICD-10-CM | POA: Diagnosis not present

## 2018-03-17 DIAGNOSIS — M6389 Disorders of muscle in diseases classified elsewhere, multiple sites: Secondary | ICD-10-CM | POA: Diagnosis not present

## 2018-03-18 DIAGNOSIS — M6389 Disorders of muscle in diseases classified elsewhere, multiple sites: Secondary | ICD-10-CM | POA: Diagnosis not present

## 2018-03-18 DIAGNOSIS — R0602 Shortness of breath: Secondary | ICD-10-CM | POA: Diagnosis not present

## 2018-03-18 DIAGNOSIS — J441 Chronic obstructive pulmonary disease with (acute) exacerbation: Secondary | ICD-10-CM | POA: Diagnosis not present

## 2018-03-18 DIAGNOSIS — I4819 Other persistent atrial fibrillation: Secondary | ICD-10-CM | POA: Diagnosis not present

## 2018-03-18 DIAGNOSIS — R2689 Other abnormalities of gait and mobility: Secondary | ICD-10-CM | POA: Diagnosis not present

## 2018-03-18 DIAGNOSIS — R278 Other lack of coordination: Secondary | ICD-10-CM | POA: Diagnosis not present

## 2018-03-19 DIAGNOSIS — I482 Chronic atrial fibrillation, unspecified: Secondary | ICD-10-CM | POA: Diagnosis not present

## 2018-03-19 DIAGNOSIS — I5032 Chronic diastolic (congestive) heart failure: Secondary | ICD-10-CM | POA: Diagnosis not present

## 2018-03-19 DIAGNOSIS — Z7901 Long term (current) use of anticoagulants: Secondary | ICD-10-CM | POA: Diagnosis not present

## 2018-03-21 DIAGNOSIS — R278 Other lack of coordination: Secondary | ICD-10-CM | POA: Diagnosis not present

## 2018-03-21 DIAGNOSIS — J441 Chronic obstructive pulmonary disease with (acute) exacerbation: Secondary | ICD-10-CM | POA: Diagnosis not present

## 2018-03-21 DIAGNOSIS — I4819 Other persistent atrial fibrillation: Secondary | ICD-10-CM | POA: Diagnosis not present

## 2018-03-21 DIAGNOSIS — R2689 Other abnormalities of gait and mobility: Secondary | ICD-10-CM | POA: Diagnosis not present

## 2018-03-21 DIAGNOSIS — D649 Anemia, unspecified: Secondary | ICD-10-CM | POA: Diagnosis not present

## 2018-03-21 DIAGNOSIS — R0602 Shortness of breath: Secondary | ICD-10-CM | POA: Diagnosis not present

## 2018-03-21 DIAGNOSIS — M6389 Disorders of muscle in diseases classified elsewhere, multiple sites: Secondary | ICD-10-CM | POA: Diagnosis not present

## 2018-03-21 LAB — BASIC METABOLIC PANEL
BUN: 37 — AB (ref 4–21)
Creatinine: 0.8 (ref 0.5–1.1)
Glucose: 97
Potassium: 4.3 (ref 3.4–5.3)
Sodium: 139 (ref 137–147)

## 2018-03-21 LAB — CBC AND DIFFERENTIAL
HCT: 43 (ref 36–46)
Hemoglobin: 14.5 (ref 12.0–16.0)
Platelets: 205 (ref 150–399)
WBC: 10.9

## 2018-03-22 ENCOUNTER — Non-Acute Institutional Stay (SKILLED_NURSING_FACILITY): Payer: Medicare Other | Admitting: Internal Medicine

## 2018-03-22 ENCOUNTER — Encounter: Payer: Self-pay | Admitting: Internal Medicine

## 2018-03-22 DIAGNOSIS — I2721 Secondary pulmonary arterial hypertension: Secondary | ICD-10-CM

## 2018-03-22 DIAGNOSIS — N183 Chronic kidney disease, stage 3 unspecified: Secondary | ICD-10-CM

## 2018-03-22 DIAGNOSIS — J441 Chronic obstructive pulmonary disease with (acute) exacerbation: Secondary | ICD-10-CM | POA: Diagnosis not present

## 2018-03-22 DIAGNOSIS — I4819 Other persistent atrial fibrillation: Secondary | ICD-10-CM | POA: Diagnosis not present

## 2018-03-22 DIAGNOSIS — S81802A Unspecified open wound, left lower leg, initial encounter: Secondary | ICD-10-CM

## 2018-03-22 DIAGNOSIS — J9621 Acute and chronic respiratory failure with hypoxia: Secondary | ICD-10-CM | POA: Diagnosis not present

## 2018-03-22 DIAGNOSIS — R2689 Other abnormalities of gait and mobility: Secondary | ICD-10-CM | POA: Diagnosis not present

## 2018-03-22 DIAGNOSIS — Z7901 Long term (current) use of anticoagulants: Secondary | ICD-10-CM

## 2018-03-22 DIAGNOSIS — K5901 Slow transit constipation: Secondary | ICD-10-CM

## 2018-03-22 DIAGNOSIS — I4821 Permanent atrial fibrillation: Secondary | ICD-10-CM

## 2018-03-22 DIAGNOSIS — R278 Other lack of coordination: Secondary | ICD-10-CM | POA: Diagnosis not present

## 2018-03-22 DIAGNOSIS — M6389 Disorders of muscle in diseases classified elsewhere, multiple sites: Secondary | ICD-10-CM | POA: Diagnosis not present

## 2018-03-22 DIAGNOSIS — R0602 Shortness of breath: Secondary | ICD-10-CM | POA: Diagnosis not present

## 2018-03-22 NOTE — Telephone Encounter (Signed)
  This encounter was created in error - please disregard.

## 2018-03-22 NOTE — Progress Notes (Signed)
Patient ID: [name], female   DOB: 07-06-1924, 85 y.o.   MRN: [id number]  Provider:  Rexene Edison. Mariea Clonts, D.O., C.M.D. Location:  [place] Room Number: [place] of Service:  SNF (31)  PCP: Shon Baton, MD Patient Care Team: Shon Baton, MD as PCP - General (Internal Medicine) Sanda Klein, MD as PCP - Cardiology (Cardiology)  Extended Emergency Contact Information Primary Emergency Contact: [name] of [place] Phone: [phone] Work Phone: [phone] Mobile Phone: [phone] Relation: Daughter Secondary Emergency Contact: Lesleigh Noe Work Phone: [phone] Mobile Phone: [phone] Relation: Son  Code Status: DNR Goals of Care: Advanced Directive information Advanced Directives 03/22/2018  Does Patient Have a Medical Advance Directive? Yes  Type of Paramedic of [place];Living will;Out of facility DNR (pink MOST or yellow form)  Does patient want to make changes to medical advance directive? No - Patient declined  Copy of [place] in Chart? Yes - validated most recent copy scanned in chart (See row information)  Would patient like information on creating a medical advance directive? No - Patient declined  Pre-existing out of facility DNR order (yellow form or pink MOST form) Yellow form placed in chart (order not valid for inpatient use)      Chief Complaint  Patient presents with  . New Admit To SNF    Rehab admission    HPI: Patient is a 85 y.o. female with h/o left breast cancer s/p lumpectomy, afib on coumadin, pulmonary htn/cor pulmonale,  COPD with chronic hypoxic respiratory failure on prn home O2 (but regular use has been recommended to her), pelvic fxs (in rehab for this before), HTN, CKD3, constipation seen today for admission to [facility] rehab s/p hospitalization from 1/13-1/14/2020 with COPD exacerbation.  She was noted in the ED to  have increased weakness, b/l rhonchi and brownish sputum, she was in hypoxic respiratory failure with sats 84% on RA, afebrile.  She was ruled out for MI, flu.  She had mild leukocytosis to 10.9.  INR was 1.99.  She received nebs and solumedrol in the ED and was then admitted briefly for COPD exacerbation and acute on chronic respiratory failure and treated with prednisone 16m daily, nebs tid, mucinex, doxycycline, flutter valve.  Sputum could not be collected.  She began to feel better--routine O2 use 24x7 encouraged.    She had mild "dehydration" and cr up to 1.23 from baseline 1.12 and lasix was held, but then resumed.  No fluids were given due to her pulmonary htn.  She is to f/u with Dr. COrene Desanctis  She was sent here with INR every 2 days due to doxy use.    She also has a wound on her left anterior shin from a distant skin biopsy by derm.  She has allevyn in place over it.    She is on 2L [place].  She's now had clear sputum, but does continue to have wheezing and rhonchi.  Nebs help.  Her biggest complaint was constant coughing is wearing her out.    Past Medical History:  Diagnosis Date  . Breast cancer ([place]    left   . Chronic anticoagulation   . Chronic atrial fibrillation   . Hypertension   . Pelvic fracture ([place] 05[place]   Past Surgical History:  Procedure Laterality Date  . ABDOMINAL HYSTERECTOMY    . BREAST LUMPECTOMY      reports that she has quit smoking. Her smoking use included cigarettes. She has  a 1.00 pack-year smoking history. She has never used smokeless tobacco. She reports current alcohol use of about 2.0 standard drinks of alcohol per week. She reports that she does not use drugs. Social History   Socioeconomic History  . Marital status: Married    Spouse name: Not on file  . Number of children: Not on file  . Years of education: Not on file  . Highest education level: Not on file  Occupational History  . Not on file  Social Needs  . Financial resource  strain: Not on file  . Food insecurity:    Worry: Not on file    Inability: Not on file  . Transportation needs:    Medical: Not on file    Non-medical: Not on file  Tobacco Use  . Smoking status: Former Smoker    Packs/day: 0.20    Years: 5.00    Pack years: 1.00    Types: Cigarettes  . Smokeless tobacco: Never Used  . Tobacco comment: pt has not smoked in 50 years  Substance and Sexual Activity  . Alcohol use: Yes    Alcohol/week: 2.0 standard drinks    Types: 2 Glasses of wine per week  . Drug use: No  . Sexual activity: Not on file  Lifestyle  . Physical activity:    Days per week: Not on file    Minutes per session: Not on file  . Stress: Not on file  Relationships  . Social connections:    Talks on phone: Not on file    Gets together: Not on file    Attends religious service: Not on file    Active member of club or organization: Not on file    Attends meetings of clubs or organizations: Not on file    Relationship status: Not on file  . Intimate partner violence:    Fear of current or ex partner: Not on file    Emotionally abused: Not on file    Physically abused: Not on file    Forced sexual activity: Not on file  Other Topics Concern  . Not on file  Social History Narrative  . Not on file    Functional Status Survey:    Family History  Problem Relation Age of Onset  . Heart disease Mother   . Ulcers Father   . Colon cancer Father   . Asthma Sister   . Asthma Son     Health Maintenance  Topic Date Due  . PNA vac Low Risk Adult (2 of 2 - PCV13) 03/02/2013  . INFLUENZA VACCINE  09/30/2017  . TETANUS/TDAP  03/02/2022  . DEXA SCAN  Completed    Allergies  Allergen Reactions  . Aspirin     Upset stomach  . Codeine Nausea Only    Nausea     Outpatient Encounter Medications as of 03/22/2018  Medication Sig  . acetaminophen (TYLENOL) 500 MG tablet Take 1,000 mg by mouth at bedtime.  Marland Kitchen allopurinol (ZYLOPRIM) 100 MG tablet Take 100 mg by mouth  at bedtime.   . ALPRAZolam (XANAX) 0.5 MG tablet Take 0.5 mg by mouth at bedtime.   . Calcium Carbonate-Vitamin D3 (CALCIUM 600-D) 600-400 MG-UNIT TABS Take 1 tablet by mouth daily.  Marland Kitchen dextromethorphan (DELSYM) 30 MG/5ML liquid Take 30 mg by mouth every 12 (twelve) hours as needed for cough.  . feeding supplement (BOOST HIGH PROTEIN) LIQD Take 1 Container by mouth 2 (two) times daily between meals.  . furosemide (LASIX) 40 MG tablet  Take 1 tablet (40 mg total) by mouth daily.  Marland Kitchen guaiFENesin (MUCINEX) 600 MG 12 hr tablet Take 600 mg by mouth 2 (two) times daily.  Marland Kitchen ipratropium (ATROVENT) 0.02 % nebulizer solution Take 2.5 mLs (0.5 mg total) by nebulization 3 (three) times daily.  Marland Kitchen levalbuterol (XOPENEX) 1.25 MG/0.5ML nebulizer solution Take 1.25 mg by nebulization 3 (three) times daily.  . metoprolol succinate (TOPROL-XL) 50 MG 24 hr tablet Take 1 tablet (50 mg total) by mouth daily.  . potassium chloride (K-DUR) 10 MEQ tablet Take 1 tablet (10 mEq total) by mouth every Monday, Wednesday, and Friday.  . warfarin (COUMADIN) 2 MG tablet Take 2-4 mg by mouth daily. Pt takes one tablet daily (2 mg) daily except for on Thursday pt takes two tablets (4 mg)  . [DISCONTINUED] CALCIUM PO Take 1 tablet by mouth daily.  . [DISCONTINUED] feeding supplement, ENSURE ENLIVE, (ENSURE ENLIVE) LIQD Take 237 mLs by mouth 2 (two) times daily between meals.   No facility-administered encounter medications on file as of 03/22/2018.     Review of Systems  Constitutional: Positive for activity change and fatigue. Negative for appetite change, chills, fever and unexpected weight change.  HENT: Negative for congestion.   Eyes: Negative for visual disturbance.  Respiratory: Positive for cough, shortness of breath and wheezing. Negative for choking.   Cardiovascular: Positive for leg swelling. Negative for chest pain and palpitations.  Gastrointestinal: Negative for abdominal pain, constipation and nausea.    Musculoskeletal: Positive for gait problem. Negative for arthralgias.  Skin: Positive for color change.  Hematological: Bruises/bleeds easily.  Psychiatric/Behavioral: Negative for confusion and sleep disturbance.    Vitals:   03/22/18 0940  BP: 107/70  Pulse: 71  Resp: 15  Temp: (!) 97.3 F (36.3 C)  TempSrc: Oral  SpO2: 96%  Weight: 127 lb (57.6 kg)  Height: _0  (1.626 m)   Body mass index is 21.8 kg/m. Physical Exam Constitutional:      General: She is not in acute distress.    Appearance: She is ill-appearing. She is not toxic-appearing or diaphoretic.  HENT:     Head: Normocephalic and atraumatic.     Right Ear: External ear normal.     Left Ear: External ear normal.     Nose: Nose normal. No congestion.     Mouth/Throat:     Mouth: Mucous membranes are moist.     Pharynx: Oropharynx is clear. No oropharyngeal exudate.  Eyes:     Extraocular Movements: Extraocular movements intact.     Conjunctiva/sclera: Conjunctivae normal.     Pupils: Pupils are equal, round, and reactive to light.  Cardiovascular:     Comments: Left mild leg swelling, nonpitting; bilateral venous insufficiency; irreg irreg Pulmonary:     Effort: Pulmonary effort is normal. No respiratory distress.     Breath sounds: Wheezing and rhonchi present. No rales.     Comments: Wearing 2L [place] Abdominal:     General: Bowel sounds are normal. There is no distension.     Palpations: Abdomen is soft. There is no mass.     Tenderness: There is no abdominal tenderness. There is no guarding or rebound.  Musculoskeletal: Normal range of motion.  Skin:    Capillary Refill: Capillary refill takes less than 2 seconds. Clubbing of fingers    Comments: Left anterior shin wound--deep hole present with pink tissue centrally, surrounding skin erythematous, but not warm and no concerning drainage   Neurological:     General: No focal deficit  present.     Mental Status: She is alert and oriented to person,  place, and time.  Psychiatric:        Mood and Affect: Mood normal.     Labs reviewed: Basic Metabolic Panel: Recent Labs    11/18/17 1832 11/19/17 0231 03/14/18 1407 03/15/18 0820  NA  --  139 139 135  K  --  3.2* 4.5 4.2  CL  --  96* 96* 97*  CO2  --  _0 GLUCOSE  --  151* 136* 141*  BUN  --  22 45* 51*  CREATININE  --  1.12* 1.23* 1.05*  CALCIUM  --  9.1 9.6 8.9  MG 1.8  --   --   --   PHOS 3.2  --   --   --    Liver Function Tests: Recent Labs    11/18/17 1259 03/15/18 0820  AST 26 31  ALT 16 20  ALKPHOS 108 81  BILITOT 4.9* 1.2  PROT 7.0 6.3*  ALBUMIN 3.7 3.4*   No results for input(s): LIPASE, AMYLASE in the last 8760 hours. No results for input(s): AMMONIA in the last 8760 hours. CBC: Recent Labs    11/19/17 0231 03/14/18 1407 03/15/18 0820  WBC 15.5* 10.9* 10.5  NEUTROABS  --  7.9*  --   HGB 13.2 15.3* 13.5  HCT 38.9 47.4* 40.6  MCV 107.2* 111.0* 109.1*  PLT 150 164 146*   Cardiac Enzymes: No results for input(s): CKTOTAL, CKMB, CKMBINDEX, TROPONINI in the last 8760 hours. BNP: Invalid input(s): POCBNP No results found for: HGBA1C Lab Results  Component Value Date   TSH 1.602 11/18/2017   No results found for: VITAMINB12 No results found for: FOLATE No results found for: IRON, TIBC, FERRITIN  Imaging and Procedures obtained prior to SNF admission: Dg Chest 2 View  Result Date: 03/14/2018 CLINICAL DATA:  Cough. EXAM: CHEST - 2 VIEW COMPARISON:  Radiographs of November 19, 2017. FINDINGS: Stable cardiomegaly. No pneumothorax or pleural effusion is noted. Both lungs are clear. The visualized skeletal structures are unremarkable. IMPRESSION: No active cardiopulmonary disease. Electronically Signed   By: Marijo Conception, M.D.   On: 03/14/2018 13:40    Assessment/Plan 1. COPD with acute exacerbation ([place]) -improved, but coarse rhonchi and wheezing remain, still coughing quite a bit and scheduled her delsym cough syrup more often due to  cough really wearing her out -cont PT, OT here with goal to return to home environment  -completed doxy and steroids  2. Acute on chronic respiratory failure with hypoxia (HCC) -cont oxygen ongoing per hospital and Dr. Loletha Grayer recs  3. PAH (pulmonary artery hypertension) ([place]) -must be careful with IVFs due to this -cont to monitor and f/u with Dr. Loletha Grayer  4. Permanent atrial fibrillation -rate controlled with current regimen, on coumadin for anticoagulation with INR goal 2-3  5. Long term (current) use of anticoagulants -INR goal 2-3 and therapeutic last check and will now recheck INR in 2 wks to avoid frequent checks now that off abx and steroids  6. Slow transit constipation -cont same regimen, no complaints here today  7. CKD (chronic kidney disease), stage III ([place]) -back to baseline cr, Avoid nephrotoxic agents like nsaids, dose adjust renally excreted meds, hydrate orally.  8. Wound of left lower extremity, initial encounter -cont allevyn dressing for now and see how she does--may need polymem to help with healing--biopsy pt reports was a year ago???  Family/ staff Communication: discussed with rehab  nurse  Labs/tests ordered:  INR in 2 wks as now therapeutic and off doxy  Shamarie Call L. Derya Dettmann, D.O. [facility] [address], [place] [address] Cell Phone (Mon-Fri 8am-5pm):  [phone] On Call:  [phone] & follow prompts after 5pm & weekends Office Phone:  [phone] Office Fax:  [phone]

## 2018-03-23 ENCOUNTER — Encounter: Payer: Self-pay | Admitting: Cardiovascular Disease

## 2018-03-23 ENCOUNTER — Ambulatory Visit (INDEPENDENT_AMBULATORY_CARE_PROVIDER_SITE_OTHER): Payer: Medicare Other | Admitting: Cardiovascular Disease

## 2018-03-23 VITALS — BP 104/69 | HR 80 | Ht 64.0 in | Wt 131.0 lb

## 2018-03-23 DIAGNOSIS — J441 Chronic obstructive pulmonary disease with (acute) exacerbation: Secondary | ICD-10-CM | POA: Diagnosis not present

## 2018-03-23 DIAGNOSIS — I2781 Cor pulmonale (chronic): Secondary | ICD-10-CM | POA: Diagnosis not present

## 2018-03-23 DIAGNOSIS — I2721 Secondary pulmonary arterial hypertension: Secondary | ICD-10-CM

## 2018-03-23 DIAGNOSIS — I4821 Permanent atrial fibrillation: Secondary | ICD-10-CM | POA: Diagnosis not present

## 2018-03-23 DIAGNOSIS — R0602 Shortness of breath: Secondary | ICD-10-CM | POA: Diagnosis not present

## 2018-03-23 DIAGNOSIS — I4819 Other persistent atrial fibrillation: Secondary | ICD-10-CM | POA: Diagnosis not present

## 2018-03-23 DIAGNOSIS — J9611 Chronic respiratory failure with hypoxia: Secondary | ICD-10-CM

## 2018-03-23 DIAGNOSIS — M6389 Disorders of muscle in diseases classified elsewhere, multiple sites: Secondary | ICD-10-CM | POA: Diagnosis not present

## 2018-03-23 DIAGNOSIS — Z7901 Long term (current) use of anticoagulants: Secondary | ICD-10-CM

## 2018-03-23 DIAGNOSIS — R278 Other lack of coordination: Secondary | ICD-10-CM | POA: Diagnosis not present

## 2018-03-23 DIAGNOSIS — R2689 Other abnormalities of gait and mobility: Secondary | ICD-10-CM | POA: Diagnosis not present

## 2018-03-23 DIAGNOSIS — J439 Emphysema, unspecified: Secondary | ICD-10-CM | POA: Diagnosis not present

## 2018-03-23 MED ORDER — BENZONATATE 100 MG PO CAPS: 100.0000 mg | ORAL_CAPSULE | Freq: Three times a day (TID) | ORAL | 0 refills | Status: DC | PRN

## 2018-03-23 NOTE — Progress Notes (Signed)
Cardiology Office Note:    Date:  03/26/2018   ID:  [name], DOB 05/16/1924, MRN [id number]  PCP:  Shon Baton, MD  Cardiologist:  Sanda Klein, MD   Referring MD: Shon Baton, MD   Chief Complaint  Patient presents with  . Follow-up    Hospitalization    History of Present Illness:    [name] is a 85 y.o. female with a hx of long-standing persistent atrial fibrillation, severe chronic lung disease, moderate pulmonary hypertension, mild systemic hypertension, reported diagnosis of chronic diastolic heart failure, presenting in follow-up after hospitalization for acute respiratory failure likely from acute bronchitis.. She has a history of left breast lumpectomy and radiation therapy in the remote past. She is accompanied by her daughter, [name].   This was exactly is a second hospitalization in the last 4 months for acute bronchitis leading to acute on chronic respiratory failure.  She has bounced back quicker this time.  Seems to be back to her usual NYHA functional class III status and is not wearing oxygen today.  She is again in the rehab section of [facility], where she really seems to reap a lot of benefit from the care she receives.  She will likely be going back to her independent living arrangements early next week.  Her cough, although nonproductive, has persisted and remains the the one major complaint.  She is receiving dextromethorphan but it is not helping much.  She denies orthopnea, PND, wheezing, chest pain, palpitations, focal neurological events, bleeding problems, leg edema.  She has not had fever or chills.  She has been gaining a little weight but it seems that she is eating much better.  [name] was briefly hospitalized in mid-September for acute bronchitis with worsening respiratory failure.  She has since returned to [facility] and has competed her rehab in the nursing section of the facility.  She is back to independent living.  She continues  to have daily shortness of breath with minimal activity.  She has not had problems with leg edema and denies orthopnea or PND.  Her activity is limited by dyspnea, NYHA functional class IIIb.  She continues to have a daily cough but this is productive of scanty amounts of white sputum.  She does not have audible wheezing.  He has not had any recent fever or chills.  She denies angina, palpitations, leg edema, claudication, focal neurological events, bleeding problems, falls or injuries.  She is compliant with warfarin anticoagulation and follow-up.  Pulmonary function test performed in 2016 showed severe obstructive lung disease with an FEV1 of roughly 750 mL,  45% of predicted, only minimal improvement after bronchodilators. She has a less prominent component of restrictive lung disease with FVC roughly 70% of predicted. Her echocardiogram performed in 2015 showed enlargement of the right heart chambers, moderate to severe right cuspid regurgitation, moderate pulmonary hypertension estimate a systolic PA pressure of 52 mmHg, normal left ventricular systolic function. CT in 2016 did not show pulmonary embolism or significant parenchymal infiltrates.  Although her records list diastolic dysfunction, this is not really supported by echo findings (hard to interpret in the setting of atrial fibrillation) and notes report that she did not have any improvement with diuretic therapy and had significant electrolyte imbalances when aggressive diuresis was attempted.  She has a very limited history of personal smoking (one pack year). She did have some secondhand exposure from her husband, although he never smoked in the house once the children were born. She had limited  improvement with pulmonary rehabilitation, but does not want to continue in that process. She had no improvement with inhaled albuterol.    Past Medical History:  Diagnosis Date  . Breast cancer ([place])    left   . Chronic anticoagulation   .  Chronic atrial fibrillation   . Hypertension   . Pelvic fracture ([place]) 05/2016    Past Surgical History:  Procedure Laterality Date  . ABDOMINAL HYSTERECTOMY    . BREAST LUMPECTOMY      Current Medications: Current Meds  Medication Sig  . acetaminophen (TYLENOL) 500 MG tablet Take 1,000 mg by mouth at bedtime.  Marland Kitchen allopurinol (ZYLOPRIM) 100 MG tablet Take 100 mg by mouth at bedtime.   . ALPRAZolam (XANAX) 0.5 MG tablet Take 0.5 mg by mouth at bedtime.   . Calcium Carbonate-Vitamin D3 (CALCIUM 600-D) 600-400 MG-UNIT TABS Take 1 tablet by mouth daily.  Marland Kitchen dextromethorphan (DELSYM) 30 MG/5ML liquid Take 30 mg by mouth every 12 (twelve) hours as needed for cough.  . feeding supplement (BOOST HIGH PROTEIN) LIQD Take 1 Container by mouth 2 (two) times daily between meals.  . furosemide (LASIX) 40 MG tablet Take 1 tablet (40 mg total) by mouth daily.  Marland Kitchen guaiFENesin (MUCINEX) 600 MG 12 hr tablet Take 600 mg by mouth 2 (two) times daily.  Marland Kitchen ipratropium (ATROVENT) 0.02 % nebulizer solution Take 2.5 mLs (0.5 mg total) by nebulization 3 (three) times daily.  Marland Kitchen levalbuterol (XOPENEX) 1.25 MG/0.5ML nebulizer solution Take 1.25 mg by nebulization 3 (three) times daily.  . metoprolol succinate (TOPROL-XL) 50 MG 24 hr tablet Take 1 tablet (50 mg total) by mouth daily.  . potassium chloride (K-DUR) 10 MEQ tablet Take 1 tablet (10 mEq total) by mouth every Monday, Wednesday, and Friday.  . warfarin (COUMADIN) 2 MG tablet Take 2-4 mg by mouth daily. Pt takes one tablet daily (2 mg) daily except for on Thursday pt takes two tablets (4 mg)     Allergies:   Aspirin and Codeine   Social History   Socioeconomic History  . Marital status: Married    Spouse name: Not on file  . Number of children: Not on file  . Years of education: Not on file  . Highest education level: Not on file  Occupational History  . Not on file  Social Needs  . Financial resource strain: Not on file  . Food insecurity:     Worry: Not on file    Inability: Not on file  . Transportation needs:    Medical: Not on file    Non-medical: Not on file  Tobacco Use  . Smoking status: Former Smoker    Packs/day: 0.20    Years: 5.00    Pack years: 1.00    Types: Cigarettes  . Smokeless tobacco: Never Used  . Tobacco comment: pt has not smoked in 50 years  Substance and Sexual Activity  . Alcohol use: Yes    Alcohol/week: 2.0 standard drinks    Types: 2 Glasses of wine per week  . Drug use: No  . Sexual activity: Not on file  Lifestyle  . Physical activity:    Days per week: Not on file    Minutes per session: Not on file  . Stress: Not on file  Relationships  . Social connections:    Talks on phone: Not on file    Gets together: Not on file    Attends religious service: Not on file    Active member of club  or organization: Not on file    Attends meetings of clubs or organizations: Not on file    Relationship status: Not on file  Other Topics Concern  . Not on file  Social History Narrative  . Not on file     Family History: The patient's family history includes Asthma in her sister and son; Colon cancer in her father; Heart disease in her mother; Ulcers in her father.  ROS:   Please see the history of present illness.    All other systems are reviewed and are negative.  EKGs/Labs/Other Studies Reviewed:    The following studies were reviewed today: Recent hospitalization records  EKG:  EKG is not ordered today.    Recent Labs: 11/18/2017: Magnesium 1.8; TSH 1.602 03/14/2018: B Natriuretic Peptide 1,095.2 03/15/2018: ALT 20 03/21/2018: BUN 37; Creatinine 0.8; Hemoglobin 14.5; Platelets 205; Potassium 4.3; Sodium 139  Recent Lipid Panel No results found for: CHOL, TRIG, HDL, CHOLHDL, VLDL, LDLCALC, LDLDIRECT  Physical Exam:    VS:  BP 104/69   Pulse 80   Ht _0  (1.626 m)   Wt 131 lb (59.4 kg)   SpO2 93%   BMI 22.49 kg/m     Wt Readings from Last 3 Encounters:  03/23/18 131 lb  (59.4 kg)  03/22/18 127 lb (57.6 kg)  12/23/17 119 lb (54 kg)       General: Alert, oriented x3, no distress, appears elderly and frail, but in a good mood today. Head: no evidence of trauma, PERRL, EOMI, no exophtalmos or lid lag, no myxedema, no xanthelasma; normal ears, nose and oropharynx Neck: normal jugular venous pulsations and no hepatojugular reflux; brisk carotid pulses without delay and no carotid bruits Chest: Diminished bilaterally to auscultation, no signs of consolidation by percussion or palpation, normal fremitus, symmetrical and full respiratory excursions Cardiovascular: normal position and quality of the apical impulse, irregular rhythm, normal first and second heart sounds, no murmurs, rubs or gallops Abdomen: no tenderness or distention, no masses by palpation, no abnormal pulsatility or arterial bruits, normal bowel sounds, no hepatosplenomegaly Extremities: no clubbing, cyanosis or edema; 2+ radial, ulnar and brachial pulses bilaterally; 2+ right femoral, posterior tibial and dorsalis pedis pulses; 2+ left femoral, posterior tibial and dorsalis pedis pulses; no subclavian or femoral bruits Neurological: grossly nonfocal Psych: Normal mood and affect   ASSESSMENT:    1. PAH (pulmonary artery hypertension) ([place])   2. Cor pulmonale, chronic (HCC)   3. Pulmonary emphysema, unspecified emphysema type ([place])   4. Chronic respiratory failure with hypoxia (HCC)   5. Permanent atrial fibrillation   6. Long term current use of anticoagulant    PLAN:    In order of problems listed above:  1. PAH: I would encourage her to continue wearing the oxygen, even if her symptoms have improved. 2. CHF: I think she has right heart failure rather than diastolic left ventricular failure.  Appears clinically euvolemic.  Weight has increased but this may be due to better appetite. 3. COPD: Phenotypically, she fully meets the pattern of a "pink puffer".  Persistent cough is likely post  bronchitic.  Gave her a prescription for Tessalon that she can take in addition to the dextromethorphan 4. AFib: Asymptomatic and well rate controlled.  On warfarin anticoagulation. 5. Warfarin: Well-tolerated without bleeding complications. No history of stroke or other embolic events. CHADSvasc 5 (age 24, gender, HTN, CHF).   Medication Adjustments/Labs and Tests Ordered: Current medicines are reviewed at length with the patient today.  Concerns  regarding medicines are outlined above.  No orders of the defined types were placed in this encounter.  Meds ordered this encounter  Medications  . benzonatate (TESSALON) 100 MG capsule    Sig: Take 1 capsule (100 mg total) by mouth 3 (three) times daily as needed for cough.    Dispense:  30 capsule    Refill:  0    Signed, Sanda Klein, MD  03/26/2018 2:28 PM    [place]

## 2018-03-23 NOTE — Patient Instructions (Addendum)
Medication Instructions:  START Tessalon 126m three times daily as needed for cough If you need a refill on your cardiac medications before your next appointment, please call your pharmacy.   Lab work: None ordered If you have labs (blood work) drawn today and your tests are completely normal, you will receive your results only by: .Marland KitchenMyChart Message (if you have MyChart) OR . A paper copy in the mail If you have any lab test that is abnormal or we need to change your treatment, we will call you to review the results.  Testing/Procedures: None ordered  Follow-Up: [facility] and your [facility] needs are our [facility].  As part of our continuing mission to provide you with exceptional heart care, we have created designated Provider Care Teams.  These Care Teams include your primary Cardiologist (physician) and Advanced Practice Providers (APPs -  Physician Assistants and Nurse Practitioners) who all work together to provide you with the care you need, when you need it. You will need a follow up appointment in October 2020   Please call our office 2 months in advance to schedule this appointment.  You may see MSanda Klein MD or one of the following Advanced Practice Providers on your designated Care Team: HFedora PVermont. AFabian Sharp PA-C

## 2018-03-24 DIAGNOSIS — R2689 Other abnormalities of gait and mobility: Secondary | ICD-10-CM | POA: Diagnosis not present

## 2018-03-24 DIAGNOSIS — I4819 Other persistent atrial fibrillation: Secondary | ICD-10-CM | POA: Diagnosis not present

## 2018-03-24 DIAGNOSIS — R278 Other lack of coordination: Secondary | ICD-10-CM | POA: Diagnosis not present

## 2018-03-24 DIAGNOSIS — J441 Chronic obstructive pulmonary disease with (acute) exacerbation: Secondary | ICD-10-CM | POA: Diagnosis not present

## 2018-03-24 DIAGNOSIS — M6389 Disorders of muscle in diseases classified elsewhere, multiple sites: Secondary | ICD-10-CM | POA: Diagnosis not present

## 2018-03-24 DIAGNOSIS — R0602 Shortness of breath: Secondary | ICD-10-CM | POA: Diagnosis not present

## 2018-03-25 DIAGNOSIS — R278 Other lack of coordination: Secondary | ICD-10-CM | POA: Diagnosis not present

## 2018-03-25 DIAGNOSIS — J441 Chronic obstructive pulmonary disease with (acute) exacerbation: Secondary | ICD-10-CM | POA: Diagnosis not present

## 2018-03-25 DIAGNOSIS — I4819 Other persistent atrial fibrillation: Secondary | ICD-10-CM | POA: Diagnosis not present

## 2018-03-25 DIAGNOSIS — R2689 Other abnormalities of gait and mobility: Secondary | ICD-10-CM | POA: Diagnosis not present

## 2018-03-25 DIAGNOSIS — M6389 Disorders of muscle in diseases classified elsewhere, multiple sites: Secondary | ICD-10-CM | POA: Diagnosis not present

## 2018-03-25 DIAGNOSIS — R0602 Shortness of breath: Secondary | ICD-10-CM | POA: Diagnosis not present

## 2018-03-26 DIAGNOSIS — I4821 Permanent atrial fibrillation: Secondary | ICD-10-CM | POA: Insufficient documentation

## 2018-03-26 DIAGNOSIS — R2689 Other abnormalities of gait and mobility: Secondary | ICD-10-CM | POA: Diagnosis not present

## 2018-03-26 DIAGNOSIS — M6389 Disorders of muscle in diseases classified elsewhere, multiple sites: Secondary | ICD-10-CM | POA: Diagnosis not present

## 2018-03-26 DIAGNOSIS — J9611 Chronic respiratory failure with hypoxia: Secondary | ICD-10-CM

## 2018-03-26 DIAGNOSIS — J441 Chronic obstructive pulmonary disease with (acute) exacerbation: Secondary | ICD-10-CM | POA: Diagnosis not present

## 2018-03-26 DIAGNOSIS — R0602 Shortness of breath: Secondary | ICD-10-CM | POA: Diagnosis not present

## 2018-03-26 DIAGNOSIS — R278 Other lack of coordination: Secondary | ICD-10-CM | POA: Diagnosis not present

## 2018-03-26 DIAGNOSIS — I4819 Other persistent atrial fibrillation: Secondary | ICD-10-CM | POA: Diagnosis not present

## 2018-03-26 HISTORY — DX: Chronic respiratory failure with hypoxia: J96.11

## 2018-03-26 HISTORY — DX: Permanent atrial fibrillation: I48.21

## 2018-03-28 DIAGNOSIS — R0602 Shortness of breath: Secondary | ICD-10-CM | POA: Diagnosis not present

## 2018-03-28 DIAGNOSIS — I4891 Unspecified atrial fibrillation: Secondary | ICD-10-CM | POA: Diagnosis not present

## 2018-03-28 DIAGNOSIS — I4819 Other persistent atrial fibrillation: Secondary | ICD-10-CM | POA: Diagnosis not present

## 2018-03-28 DIAGNOSIS — Z7901 Long term (current) use of anticoagulants: Secondary | ICD-10-CM | POA: Diagnosis not present

## 2018-03-28 DIAGNOSIS — R2689 Other abnormalities of gait and mobility: Secondary | ICD-10-CM | POA: Diagnosis not present

## 2018-03-28 DIAGNOSIS — R278 Other lack of coordination: Secondary | ICD-10-CM | POA: Diagnosis not present

## 2018-03-28 DIAGNOSIS — J441 Chronic obstructive pulmonary disease with (acute) exacerbation: Secondary | ICD-10-CM | POA: Diagnosis not present

## 2018-03-28 DIAGNOSIS — M6389 Disorders of muscle in diseases classified elsewhere, multiple sites: Secondary | ICD-10-CM | POA: Diagnosis not present

## 2018-03-28 LAB — PROTIME-INR: Protime: 27.4 — AB (ref 10.0–13.8)

## 2018-03-28 LAB — POCT INR: INR: 2.6 — AB (ref 0.9–1.1)

## 2018-03-29 ENCOUNTER — Non-Acute Institutional Stay (SKILLED_NURSING_FACILITY): Payer: Medicare Other | Admitting: Internal Medicine

## 2018-03-29 ENCOUNTER — Encounter: Payer: Self-pay | Admitting: Internal Medicine

## 2018-03-29 DIAGNOSIS — J9621 Acute and chronic respiratory failure with hypoxia: Secondary | ICD-10-CM

## 2018-03-29 DIAGNOSIS — I2721 Secondary pulmonary arterial hypertension: Secondary | ICD-10-CM | POA: Diagnosis not present

## 2018-03-29 DIAGNOSIS — I50813 Acute on chronic right heart failure: Secondary | ICD-10-CM | POA: Diagnosis not present

## 2018-03-29 DIAGNOSIS — R278 Other lack of coordination: Secondary | ICD-10-CM | POA: Diagnosis not present

## 2018-03-29 DIAGNOSIS — J441 Chronic obstructive pulmonary disease with (acute) exacerbation: Secondary | ICD-10-CM | POA: Diagnosis not present

## 2018-03-29 DIAGNOSIS — S81802D Unspecified open wound, left lower leg, subsequent encounter: Secondary | ICD-10-CM | POA: Diagnosis not present

## 2018-03-29 DIAGNOSIS — R2689 Other abnormalities of gait and mobility: Secondary | ICD-10-CM | POA: Diagnosis not present

## 2018-03-29 DIAGNOSIS — M6389 Disorders of muscle in diseases classified elsewhere, multiple sites: Secondary | ICD-10-CM | POA: Diagnosis not present

## 2018-03-29 DIAGNOSIS — I4819 Other persistent atrial fibrillation: Secondary | ICD-10-CM | POA: Diagnosis not present

## 2018-03-29 DIAGNOSIS — R0602 Shortness of breath: Secondary | ICD-10-CM | POA: Diagnosis not present

## 2018-03-29 NOTE — Progress Notes (Signed)
Patient ID: [name], female   DOB: [birth year], 85 y.o.   MRN: [id number]  Location:  [place] Room Number: [place] of Service:  SNF (31) Provider: Rekisha Welling L. Mariea Clonts, D.O., C.M.D.  Shon Baton, MD  Patient Care Team: Shon Baton, MD as PCP - General (Internal Medicine) Sanda Klein, MD as PCP - Cardiology (Cardiology)  Extended Emergency Contact Information Primary Emergency Contact: [name] of [place] Phone: [phone] Work Phone: [phone] Mobile Phone: [phone] Relation: Daughter Secondary Emergency Contact: Donnalee, [place] Work Phone: [phone] Mobile Phone: [phone] Relation: Son  Code Status:  DNR Goals of care: Advanced Directive information Advanced Directives 03/29/2018  Does Patient Have a Medical Advance Directive? Yes  Type of Paramedic of [place];Living will  Does patient want to make changes to medical advance directive? No - Patient declined  Copy of [place] in Chart? Yes - validated most recent copy scanned in chart (See row information)  Would patient like information on creating a medical advance directive? No - Patient declined  Pre-existing out of facility DNR order (yellow form or pink MOST form) -     Chief Complaint  Patient presents with  . Acute Visit    weight increasing, LLE unhealed biopsy site    HPI:  Pt is a 85 y.o. female with pulmonary artery htn, chronic right-sided heart failure, emphysema with chronic respiratory failure on O2, seen today for an acute visit for weight gain, dyspnea on exertion to where she is no longer able to ambulate multiple times around the unit (now struggling to get around once), edema increased.   She also continues to have lack of improvement in the LLE anterior shin wound (biopsy site from derm).  She's got no pain, no erythema or warmth, but has 2+ edema and serosanguinous  drainage from the wound.  Polymem is being changed q2 days after cleansing with saline and patting dry.  It's then covered and secured.  POX only 88% this am on RA.    She has crackles, more dyspnea on exertion, not able to walk as far.  On lasix 34m daily at present.    Past Medical History:  Diagnosis Date  . Breast cancer ([place]    left   . Chronic anticoagulation   . Chronic atrial fibrillation   . Hypertension   . Pelvic fracture ([place] 05/[place]   Past Surgical History:  Procedure Laterality Date  . ABDOMINAL HYSTERECTOMY    . BREAST LUMPECTOMY      Allergies  Allergen Reactions  . Aspirin     Upset stomach  . Codeine Nausea Only    Nausea     Outpatient Encounter Medications as of 03/29/2018  Medication Sig  . acetaminophen (TYLENOL) 500 MG tablet Take 1,000 mg by mouth at bedtime.  .Marland Kitchenallopurinol (ZYLOPRIM) 100 MG tablet Take 100 mg by mouth at bedtime.   . ALPRAZolam (XANAX) 0.5 MG tablet Take 0.5 mg by mouth at bedtime.   . benzonatate (TESSALON) 100 MG capsule Take 1 capsule (100 mg total) by mouth 3 (three) times daily as needed for cough.  . Calcium Carbonate-Vitamin D3 (CALCIUM 600-D) 600-400 MG-UNIT TABS Take 1 tablet by mouth daily.  .Marland Kitchendextromethorphan (DELSYM) 30 MG/5ML liquid Take 30 mg by mouth every 12 (twelve) hours as needed for cough.  . feeding supplement (BOOST HIGH PROTEIN) LIQD Take 1 Container by mouth 2 (two) times daily between meals.  . furosemide (LASIX) 40  MG tablet Take 1 tablet (40 mg total) by mouth daily.  Marland Kitchen guaiFENesin (MUCINEX) 600 MG 12 hr tablet Take 600 mg by mouth 2 (two) times daily.  Marland Kitchen ipratropium (ATROVENT) 0.02 % nebulizer solution Take 2.5 mLs (0.5 mg total) by nebulization 3 (three) times daily.  Marland Kitchen levalbuterol (XOPENEX) 1.25 MG/0.5ML nebulizer solution Take 1.25 mg by nebulization 3 (three) times daily.  . metoprolol succinate (TOPROL-XL) 50 MG 24 hr tablet Take 1 tablet (50 mg total) by mouth daily.  . potassium chloride (K-DUR)  10 MEQ tablet Take 1 tablet (10 mEq total) by mouth every Monday, Wednesday, and Friday.  . warfarin (COUMADIN) 2 MG tablet Take 2-4 mg by mouth daily. Pt takes one tablet daily (2 mg) daily except for on Thursday pt takes two tablets (4 mg)   No facility-administered encounter medications on file as of 03/29/2018.     Review of Systems  Constitutional: Positive for activity change and unexpected weight change. Negative for appetite change, chills and fever.  HENT: Negative for congestion.   Eyes: Negative for visual disturbance.  Respiratory: Positive for cough and shortness of breath. Negative for chest tightness and wheezing.   Cardiovascular: Positive for leg swelling. Negative for chest pain and palpitations.  Gastrointestinal: Negative for abdominal pain.  Genitourinary: Negative for dysuria.  Musculoskeletal: Positive for gait problem.  Skin: Negative for color change.  Neurological: Positive for weakness. Negative for dizziness.  Hematological: Bruises/bleeds easily.  Psychiatric/Behavioral: Negative for agitation, behavioral problems and sleep disturbance.    Immunization History  Administered Date(s) Administered  . Influenza Split 12/07/2014  . Influenza-Unspecified 11/05/2013  . Pneumococcal Polysaccharide-23 03/02/2012  . Tdap 03/02/2012  . Zoster 03/03/2011   Pertinent  Health Maintenance Due  Topic Date Due  . PNA vac Low Risk Adult (2 of 2 - PCV13) 03/02/2013  . INFLUENZA VACCINE  09/30/2017  . DEXA SCAN  Completed   No flowsheet data found. Functional Status Survey:    Vitals:   03/29/18 1027  BP: 115/75  Pulse: 80  Resp: 18  Temp: (!) 97.4 F (36.3 C)  TempSrc: Oral  SpO2: (!) 88%  Weight: 132 lb (59.9 kg)  Height: 5' 4" (1.626 m)   Body mass index is 22.66 kg/m. Physical Exam Constitutional:      General: She is not in acute distress.    Appearance: Normal appearance. She is not toxic-appearing.  HENT:     Head: Normocephalic and atraumatic.      Nose: No congestion.  Eyes:     Conjunctiva/sclera: Conjunctivae normal.  Cardiovascular:     Pulses: Normal pulses.     Heart sounds: Normal heart sounds.     Comments: irreg irreg Pulmonary:     Effort: Pulmonary effort is normal.     Breath sounds: Wheezing, rhonchi and rales present.  Abdominal:     General: Bowel sounds are normal.     Palpations: Abdomen is soft.  Musculoskeletal: Normal range of motion.     Right lower leg: Edema present.     Left lower leg: Edema present.     Comments: Walks with rollator walker, 2-3+ edema bilateral legs  Skin:    General: Skin is warm and dry.     Capillary Refill: Capillary refill takes less than 2 seconds.     Coloration: Skin is pale.     Comments: Left anterior shin wound remains moist, nonpainful, no surrounding erythema or warmth, draining serosanguinous drainage on dressing  Neurological:     General:  No focal deficit present.     Mental Status: She is alert and oriented to person, place, and time.     Labs reviewed: Recent Labs    11/18/17 1832 11/19/17 0231 03/14/18 1407 03/15/18 0820 03/21/18 0300  NA  --  139 139 135 139  K  --  3.2* 4.5 4.2 4.3  CL  --  96* 96* 97*  --   CO2  --  _0 --   GLUCOSE  --  151* 136* 141*  --   BUN  --  22 45* 51* 37*  CREATININE  --  1.12* 1.23* 1.05* 0.8  CALCIUM  --  9.1 9.6 8.9  --   MG 1.8  --   --   --   --   PHOS 3.2  --   --   --   --    Recent Labs    11/18/17 1259 03/15/18 0820  AST 26 31  ALT 16 20  ALKPHOS 108 81  BILITOT 4.9* 1.2  PROT 7.0 6.3*  ALBUMIN 3.7 3.4*   Recent Labs    11/19/17 0231 03/14/18 1407 03/15/18 0820 03/21/18 0300  WBC 15.5* 10.9* 10.5 10.9  NEUTROABS  --  7.9*  --   --   HGB 13.2 15.3* 13.5 14.5  HCT 38.9 47.4* 40.6 43  MCV 107.2* 111.0* 109.1*  --   PLT 150 164 146* 205   Lab Results  Component Value Date   TSH 1.602 11/18/2017   No results found for: HGBA1C No results found for: CHOL, HDL, LDLCALC, LDLDIRECT,  TRIG, CHOLHDL  Significant Diagnostic Results in last 30 days:  Dg Chest 2 View  Result Date: 03/14/2018 CLINICAL DATA:  Cough. EXAM: CHEST - 2 VIEW COMPARISON:  Radiographs of November 19, 2017. FINDINGS: Stable cardiomegaly. No pneumothorax or pleural effusion is noted. Both lungs are clear. The visualized skeletal structures are unremarkable. IMPRESSION: No active cardiopulmonary disease. Electronically Signed   By: Marijo Conception, M.D.   On: 03/14/2018 13:40    Assessment/Plan 1. Acute on chronic respiratory failure with hypoxia (HCC) -sats lower past few days with weight gain due to #2  2. Acute on chronic right-sided congestive heart failure (HCC) -increase lasix to 15m bid x 3 days -cont daily weights and report response to NP  3. PAH (pulmonary artery hypertension) ([facility] -notable with her advanced COPD requiring O2  4. Wound of left lower extremity, subsequent encounter -not really changed much in past week -maybe too moist so will add aquacel ag with silver instead of polymem  -also begin prostat protein supplement  Family/ staff Communication: discussed with snf nurse  Labs/tests ordered:  No new  Mylani Gentry L. Adaisha Campise, D.O. GTroxelvilleGroup [address] [place] [address]Cell Phone (Mon-Fri 8am-5pm):  [phone] Call:  [phone]& follow prompts after 5pm & weekends Office Phone:  [phone] Fax:  [phone]

## 2018-03-30 DIAGNOSIS — M6389 Disorders of muscle in diseases classified elsewhere, multiple sites: Secondary | ICD-10-CM | POA: Diagnosis not present

## 2018-03-30 DIAGNOSIS — R0602 Shortness of breath: Secondary | ICD-10-CM | POA: Diagnosis not present

## 2018-03-30 DIAGNOSIS — J441 Chronic obstructive pulmonary disease with (acute) exacerbation: Secondary | ICD-10-CM | POA: Diagnosis not present

## 2018-03-30 DIAGNOSIS — I4819 Other persistent atrial fibrillation: Secondary | ICD-10-CM | POA: Diagnosis not present

## 2018-03-30 DIAGNOSIS — R2689 Other abnormalities of gait and mobility: Secondary | ICD-10-CM | POA: Diagnosis not present

## 2018-03-30 DIAGNOSIS — R278 Other lack of coordination: Secondary | ICD-10-CM | POA: Diagnosis not present

## 2018-03-31 DIAGNOSIS — R278 Other lack of coordination: Secondary | ICD-10-CM | POA: Diagnosis not present

## 2018-03-31 DIAGNOSIS — R2689 Other abnormalities of gait and mobility: Secondary | ICD-10-CM | POA: Diagnosis not present

## 2018-03-31 DIAGNOSIS — M6389 Disorders of muscle in diseases classified elsewhere, multiple sites: Secondary | ICD-10-CM | POA: Diagnosis not present

## 2018-03-31 DIAGNOSIS — B351 Tinea unguium: Secondary | ICD-10-CM | POA: Diagnosis not present

## 2018-03-31 DIAGNOSIS — I4819 Other persistent atrial fibrillation: Secondary | ICD-10-CM | POA: Diagnosis not present

## 2018-03-31 DIAGNOSIS — J441 Chronic obstructive pulmonary disease with (acute) exacerbation: Secondary | ICD-10-CM | POA: Diagnosis not present

## 2018-03-31 DIAGNOSIS — R0602 Shortness of breath: Secondary | ICD-10-CM | POA: Diagnosis not present

## 2018-04-01 DIAGNOSIS — J441 Chronic obstructive pulmonary disease with (acute) exacerbation: Secondary | ICD-10-CM | POA: Diagnosis not present

## 2018-04-01 DIAGNOSIS — R278 Other lack of coordination: Secondary | ICD-10-CM | POA: Diagnosis not present

## 2018-04-01 DIAGNOSIS — R2689 Other abnormalities of gait and mobility: Secondary | ICD-10-CM | POA: Diagnosis not present

## 2018-04-01 DIAGNOSIS — I4819 Other persistent atrial fibrillation: Secondary | ICD-10-CM | POA: Diagnosis not present

## 2018-04-01 DIAGNOSIS — M6389 Disorders of muscle in diseases classified elsewhere, multiple sites: Secondary | ICD-10-CM | POA: Diagnosis not present

## 2018-04-01 DIAGNOSIS — R0602 Shortness of breath: Secondary | ICD-10-CM | POA: Diagnosis not present

## 2018-04-04 ENCOUNTER — Encounter: Payer: Self-pay | Admitting: Adult Health

## 2018-04-04 ENCOUNTER — Non-Acute Institutional Stay (SKILLED_NURSING_FACILITY): Payer: Medicare Other | Admitting: Adult Health

## 2018-04-04 DIAGNOSIS — J432 Centrilobular emphysema: Secondary | ICD-10-CM

## 2018-04-04 DIAGNOSIS — K5901 Slow transit constipation: Secondary | ICD-10-CM

## 2018-04-04 DIAGNOSIS — R635 Abnormal weight gain: Secondary | ICD-10-CM

## 2018-04-04 DIAGNOSIS — M25511 Pain in right shoulder: Secondary | ICD-10-CM | POA: Diagnosis not present

## 2018-04-04 NOTE — Progress Notes (Signed)
Location:  [facility] of Service:  SNF (31) Provider:   Cindi Carbon, [place] [phone]   Shon Baton, MD  Patient Care Team: Shon Baton, MD as PCP - General (Internal Medicine) Sanda Klein, MD as PCP - Cardiology (Cardiology)  Extended Emergency Contact Information Primary Emergency Contact: [name] of [place] Phone: [phone] Work Phone: [phone] Mobile Phone: [phone] Relation: Daughter Secondary Emergency Contact: [place], [place] Work Phone: [phone] Mobile Phone: [phone] Relation: Son  Code Status:  DNR Goals of care: Advanced Directive information Advanced Directives 03/29/2018  Does Patient Have a Medical Advance Directive? Yes  Type of Paramedic of [place];Living will  Does patient want to make changes to medical advance directive? No - Patient declined  Copy of [place] in Chart? Yes - validated most recent copy scanned in chart (See row information)  Would patient like information on creating a medical advance directive? No - Patient declined  Pre-existing out of facility DNR order (yellow form or pink MOST form) -     Chief Complaint  Patient presents with  . Acute Visit    right shoulder pain and weight gian    HPI:  Pt is a 85 y.o. female seen today for an acute visit for right shoulder pain and weight gain. PMH significant for cor pulmonale, COPD with FEV1 45% predicted, afib on chronic coag, CKD, and possible diastolic CHF.  She is currently residing in skilled rehab after a hospitalization(03/14/18-03/15/18) for acute resp failure associated with COPD exacerbation.Marland Kitchen She was treated with prednisone, nebs, and doxycycline. Lasix was held during her hospitalization as she would felt to be dry and resumed at 40 mg qd. She was seen by Dr. Mariea Clonts and dosage was increased to 40 mg bid for 3 days on 1/28.  Her weight did not  improve. Cardiology notes on 03/23/2018 indicate that she has not had good response to aggressive diuresis in the past and developed electrolyte imbalances.   Wt Readings from Last 3 Encounters:  04/04/18 135 lb 12.8 oz (61.6 kg)  03/29/18 132 lb (59.9 kg)  03/23/18 131 lb (59.4 kg)  Resident and nurse report increased sob with exertion but no sob at rest. Weight has increased by 4 lbs in the past week. She also has increased edema in her legs. She has very thin skin on her legs and a chronic non healing wound to the left leg and does not like to wear compression hose. No chest pain or palpitations. She has a chronic cough with clear sputum noted. No fever. Sats are in the 80's on room air but in the low to mid 90's with 2-3 liters of oxygen. The nurse reports she does not like to wear oxygen. She reports that her right shoulder has been hurting for 1 week. She is not aware of any injury. She has not had any swelling, warmth, or bruising to the area. No numbness or tingling. She currently uses a walker for ambulation.  Past Medical History:  Diagnosis Date  . Breast cancer ([place])    left   . Chronic anticoagulation   . Chronic atrial fibrillation   . Hypertension   . Pelvic fracture ([place]) 05/2016   Past Surgical History:  Procedure Laterality Date  . ABDOMINAL HYSTERECTOMY    . BREAST LUMPECTOMY      Allergies  Allergen Reactions  . Aspirin     Upset stomach  . Codeine Nausea Only  Nausea     Outpatient Encounter Medications as of 04/04/2018  Medication Sig  . acetaminophen (TYLENOL) 500 MG tablet Take 1,000 mg by mouth at bedtime.  Marland Kitchen allopurinol (ZYLOPRIM) 100 MG tablet Take 100 mg by mouth at bedtime.   . ALPRAZolam (XANAX) 0.5 MG tablet Take 0.5 mg by mouth at bedtime.   . benzonatate (TESSALON) 100 MG capsule Take 1 capsule (100 mg total) by mouth 3 (three) times daily as needed for cough.  . Calcium Carbonate-Vitamin D3 (CALCIUM 600-D) 600-400 MG-UNIT TABS Take 1 tablet by  mouth daily.  Marland Kitchen dextromethorphan (DELSYM) 30 MG/5ML liquid Take 30 mg by mouth every 12 (twelve) hours as needed for cough.  . feeding supplement (BOOST HIGH PROTEIN) LIQD Take 1 Container by mouth 2 (two) times daily between meals.  . furosemide (LASIX) 40 MG tablet Take 1 tablet (40 mg total) by mouth daily.  Marland Kitchen guaiFENesin (MUCINEX) 600 MG 12 hr tablet Take 600 mg by mouth 2 (two) times daily.  Marland Kitchen ipratropium (ATROVENT) 0.02 % nebulizer solution Take 2.5 mLs (0.5 mg total) by nebulization 3 (three) times daily.  Marland Kitchen levalbuterol (XOPENEX) 1.25 MG/0.5ML nebulizer solution Take 1.25 mg by nebulization 3 (three) times daily.  . metoprolol succinate (TOPROL-XL) 50 MG 24 hr tablet Take 1 tablet (50 mg total) by mouth daily.  . potassium chloride (K-DUR) 10 MEQ tablet Take 1 tablet (10 mEq total) by mouth every Monday, Wednesday, and Friday.  . warfarin (COUMADIN) 2 MG tablet Take 2-4 mg by mouth daily. Pt takes one tablet daily (2 mg) daily except for on Thursday pt takes two tablets (4 mg)   No facility-administered encounter medications on file as of 04/04/2018.     Review of Systems  Constitutional: Positive for activity change and unexpected weight change. Negative for appetite change, chills, diaphoresis, fatigue and fever.  HENT: Negative for congestion, rhinorrhea, sinus pressure, sinus pain, trouble swallowing and voice change.   Respiratory: Positive for cough and shortness of breath. Negative for wheezing.   Cardiovascular: Positive for leg swelling. Negative for chest pain and palpitations.  Gastrointestinal: Positive for constipation. Negative for abdominal distention, abdominal pain and diarrhea.  Genitourinary: Negative for difficulty urinating and dysuria.  Musculoskeletal: Positive for arthralgias and gait problem. Negative for back pain, joint swelling and myalgias.  Neurological: Negative for dizziness, tremors, seizures, syncope, facial asymmetry, speech difficulty, weakness,  light-headedness, numbness and headaches.  Psychiatric/Behavioral: Negative for agitation, behavioral problems and confusion.    Immunization History  Administered Date(s) Administered  . Influenza Split 12/07/2014  . Influenza-Unspecified 11/05/2013  . Pneumococcal Polysaccharide-23 03/02/2012  . Tdap 03/02/2012  . Zoster 03/03/2011   Pertinent  Health Maintenance Due  Topic Date Due  . PNA vac Low Risk Adult (2 of 2 - PCV13) 03/02/2013  . INFLUENZA VACCINE  09/30/2017  . DEXA SCAN  Completed   No flowsheet data found. Functional Status Survey:    There were no vitals filed for this visit. There is no height or weight on file to calculate BMI. Physical Exam Vitals signs and nursing note reviewed.  Constitutional:      General: She is not in acute distress.    Appearance: She is not toxic-appearing.  HENT:     Head: Normocephalic and atraumatic.     Nose: Nose normal. No congestion or rhinorrhea.     Mouth/Throat:     Mouth: Mucous membranes are moist.     Pharynx: Oropharynx is clear. No oropharyngeal exudate or posterior oropharyngeal erythema.  Cardiovascular:     Rate and Rhythm: Rhythm irregular.     Pulses:          Dorsalis pedis pulses are 1+ on the right side and 1+ on the left side.     Heart sounds: No murmur.  Pulmonary:     Effort: Pulmonary effort is normal.     Breath sounds: Normal breath sounds.  Abdominal:     General: Abdomen is flat. Bowel sounds are normal. There is distension (mild).     Palpations: Abdomen is soft.  Musculoskeletal:     Right shoulder: She exhibits decreased range of motion, pain and decreased strength. She exhibits no tenderness, no bony tenderness, no swelling, no effusion, no crepitus, no deformity, no laceration, no spasm and normal pulse.       Arms:     Right lower leg: Edema present.     Left lower leg: Edema present.  Lymphadenopathy:     Cervical: No cervical adenopathy.  Skin:    General: Skin is warm and dry.      Comments: Brown/blue discoloration to BLE  Neurological:     General: No focal deficit present.     Mental Status: She is alert and oriented to person, place, and time. Mental status is at baseline.  Psychiatric:        Mood and Affect: Mood normal.     Labs reviewed: Recent Labs    11/18/17 1832 11/19/17 0231 03/14/18 1407 03/15/18 0820 03/21/18 0300  NA  --  139 139 135 139  K  --  3.2* 4.5 4.2 4.3  CL  --  96* 96* 97*  --   CO2  --  _0 --   GLUCOSE  --  151* 136* 141*  --   BUN  --  22 45* 51* 37*  CREATININE  --  1.12* 1.23* 1.05* 0.8  CALCIUM  --  9.1 9.6 8.9  --   MG 1.8  --   --   --   --   PHOS 3.2  --   --   --   --    Recent Labs    11/18/17 1259 03/15/18 0820  AST 26 31  ALT 16 20  ALKPHOS 108 81  BILITOT 4.9* 1.2  PROT 7.0 6.3*  ALBUMIN 3.7 3.4*   Recent Labs    11/19/17 0231 03/14/18 1407 03/15/18 0820 03/21/18 0300  WBC 15.5* 10.9* 10.5 10.9  NEUTROABS  --  7.9*  --   --   HGB 13.2 15.3* 13.5 14.5  HCT 38.9 47.4* 40.6 43  MCV 107.2* 111.0* 109.1*  --   PLT 150 164 146* 205   Lab Results  Component Value Date   TSH 1.602 11/18/2017   No results found for: HGBA1C No results found for: CHOL, HDL, LDLCALC, LDLDIRECT, TRIG, CHOLHDL  Significant Diagnostic Results in last 30 days:  Dg Chest 2 View  Result Date: 03/14/2018 CLINICAL DATA:  Cough. EXAM: CHEST - 2 VIEW COMPARISON:  Radiographs of November 19, 2017. FINDINGS: Stable cardiomegaly. No pneumothorax or pleural effusion is noted. Both lungs are clear. The visualized skeletal structures are unremarkable. IMPRESSION: No active cardiopulmonary disease. Electronically Signed   By: Marijo Conception, M.D.   On: 03/14/2018 13:40    Assessment/Plan 1. Weight gain With associated hx of COPD and pulmonary htn Torsemide 40 mg x 1 dose, then 20 mg qd x 3 days. (failed increased lasix dosing) Avoid aggressive diuresis per cardiology  notes Light compression wraps on in the am and off in  the pm Leg elevation Recommend wearing oxygen at all times for now  2. Acute pain of right shoulder Probable rotator cuff injury based on exam findings Xray right shoulder Tylenol 3 grams tid Voltaren gel 1% grams qid, avoiding oral nsaids due to coumadin use if possible.   3. Constipation Colace 100 mg qd   4. COPD Improved symptoms of cough and sputum production since her hospitalization. She would benefit from f/u with Dr. Lamonte Sakai in the near future  Continue atrovent and xopenex scheduled TID   Family/ staff Communication: discussed with resident and staff  Labs/tests ordered:  BMP with next INR

## 2018-04-05 DIAGNOSIS — R2689 Other abnormalities of gait and mobility: Secondary | ICD-10-CM | POA: Diagnosis not present

## 2018-04-05 DIAGNOSIS — R278 Other lack of coordination: Secondary | ICD-10-CM | POA: Diagnosis not present

## 2018-04-05 DIAGNOSIS — J441 Chronic obstructive pulmonary disease with (acute) exacerbation: Secondary | ICD-10-CM | POA: Diagnosis not present

## 2018-04-05 DIAGNOSIS — I4819 Other persistent atrial fibrillation: Secondary | ICD-10-CM | POA: Diagnosis not present

## 2018-04-05 DIAGNOSIS — J9601 Acute respiratory failure with hypoxia: Secondary | ICD-10-CM | POA: Diagnosis not present

## 2018-04-05 DIAGNOSIS — M6389 Disorders of muscle in diseases classified elsewhere, multiple sites: Secondary | ICD-10-CM | POA: Diagnosis not present

## 2018-04-05 DIAGNOSIS — R0609 Other forms of dyspnea: Secondary | ICD-10-CM | POA: Diagnosis not present

## 2018-04-05 DIAGNOSIS — R0602 Shortness of breath: Secondary | ICD-10-CM | POA: Diagnosis not present

## 2018-04-05 DIAGNOSIS — N183 Chronic kidney disease, stage 3 (moderate): Secondary | ICD-10-CM | POA: Diagnosis not present

## 2018-04-06 DIAGNOSIS — R278 Other lack of coordination: Secondary | ICD-10-CM | POA: Diagnosis not present

## 2018-04-06 DIAGNOSIS — R0602 Shortness of breath: Secondary | ICD-10-CM | POA: Diagnosis not present

## 2018-04-06 DIAGNOSIS — J441 Chronic obstructive pulmonary disease with (acute) exacerbation: Secondary | ICD-10-CM | POA: Diagnosis not present

## 2018-04-06 DIAGNOSIS — I4819 Other persistent atrial fibrillation: Secondary | ICD-10-CM | POA: Diagnosis not present

## 2018-04-06 DIAGNOSIS — R2689 Other abnormalities of gait and mobility: Secondary | ICD-10-CM | POA: Diagnosis not present

## 2018-04-06 DIAGNOSIS — M6389 Disorders of muscle in diseases classified elsewhere, multiple sites: Secondary | ICD-10-CM | POA: Diagnosis not present

## 2018-04-07 DIAGNOSIS — R0602 Shortness of breath: Secondary | ICD-10-CM | POA: Diagnosis not present

## 2018-04-07 DIAGNOSIS — R278 Other lack of coordination: Secondary | ICD-10-CM | POA: Diagnosis not present

## 2018-04-07 DIAGNOSIS — I4819 Other persistent atrial fibrillation: Secondary | ICD-10-CM | POA: Diagnosis not present

## 2018-04-07 DIAGNOSIS — R2689 Other abnormalities of gait and mobility: Secondary | ICD-10-CM | POA: Diagnosis not present

## 2018-04-07 DIAGNOSIS — J441 Chronic obstructive pulmonary disease with (acute) exacerbation: Secondary | ICD-10-CM | POA: Diagnosis not present

## 2018-04-07 DIAGNOSIS — M6389 Disorders of muscle in diseases classified elsewhere, multiple sites: Secondary | ICD-10-CM | POA: Diagnosis not present

## 2018-04-08 DIAGNOSIS — J441 Chronic obstructive pulmonary disease with (acute) exacerbation: Secondary | ICD-10-CM | POA: Diagnosis not present

## 2018-04-08 DIAGNOSIS — I4819 Other persistent atrial fibrillation: Secondary | ICD-10-CM | POA: Diagnosis not present

## 2018-04-08 DIAGNOSIS — R2689 Other abnormalities of gait and mobility: Secondary | ICD-10-CM | POA: Diagnosis not present

## 2018-04-08 DIAGNOSIS — R278 Other lack of coordination: Secondary | ICD-10-CM | POA: Diagnosis not present

## 2018-04-08 DIAGNOSIS — M6389 Disorders of muscle in diseases classified elsewhere, multiple sites: Secondary | ICD-10-CM | POA: Diagnosis not present

## 2018-04-08 DIAGNOSIS — R0602 Shortness of breath: Secondary | ICD-10-CM | POA: Diagnosis not present

## 2018-04-10 DIAGNOSIS — R0602 Shortness of breath: Secondary | ICD-10-CM | POA: Diagnosis not present

## 2018-04-10 DIAGNOSIS — J441 Chronic obstructive pulmonary disease with (acute) exacerbation: Secondary | ICD-10-CM | POA: Diagnosis not present

## 2018-04-10 DIAGNOSIS — I4819 Other persistent atrial fibrillation: Secondary | ICD-10-CM | POA: Diagnosis not present

## 2018-04-10 DIAGNOSIS — R278 Other lack of coordination: Secondary | ICD-10-CM | POA: Diagnosis not present

## 2018-04-10 DIAGNOSIS — M6389 Disorders of muscle in diseases classified elsewhere, multiple sites: Secondary | ICD-10-CM | POA: Diagnosis not present

## 2018-04-10 DIAGNOSIS — R2689 Other abnormalities of gait and mobility: Secondary | ICD-10-CM | POA: Diagnosis not present

## 2018-04-11 ENCOUNTER — Non-Acute Institutional Stay (SKILLED_NURSING_FACILITY): Payer: Medicare Other | Admitting: Adult Health

## 2018-04-11 ENCOUNTER — Encounter: Payer: Self-pay | Admitting: Adult Health

## 2018-04-11 DIAGNOSIS — N183 Chronic kidney disease, stage 3 unspecified: Secondary | ICD-10-CM

## 2018-04-11 DIAGNOSIS — Z7901 Long term (current) use of anticoagulants: Secondary | ICD-10-CM

## 2018-04-11 DIAGNOSIS — I482 Chronic atrial fibrillation, unspecified: Secondary | ICD-10-CM | POA: Diagnosis not present

## 2018-04-11 DIAGNOSIS — D649 Anemia, unspecified: Secondary | ICD-10-CM | POA: Diagnosis not present

## 2018-04-11 DIAGNOSIS — I4819 Other persistent atrial fibrillation: Secondary | ICD-10-CM | POA: Diagnosis not present

## 2018-04-11 DIAGNOSIS — R0602 Shortness of breath: Secondary | ICD-10-CM | POA: Diagnosis not present

## 2018-04-11 DIAGNOSIS — M6389 Disorders of muscle in diseases classified elsewhere, multiple sites: Secondary | ICD-10-CM | POA: Diagnosis not present

## 2018-04-11 DIAGNOSIS — R2689 Other abnormalities of gait and mobility: Secondary | ICD-10-CM | POA: Diagnosis not present

## 2018-04-11 DIAGNOSIS — J439 Emphysema, unspecified: Secondary | ICD-10-CM

## 2018-04-11 DIAGNOSIS — I5040 Unspecified combined systolic (congestive) and diastolic (congestive) heart failure: Secondary | ICD-10-CM | POA: Diagnosis not present

## 2018-04-11 DIAGNOSIS — I2721 Secondary pulmonary arterial hypertension: Secondary | ICD-10-CM | POA: Diagnosis not present

## 2018-04-11 DIAGNOSIS — J9611 Chronic respiratory failure with hypoxia: Secondary | ICD-10-CM

## 2018-04-11 DIAGNOSIS — J441 Chronic obstructive pulmonary disease with (acute) exacerbation: Secondary | ICD-10-CM | POA: Diagnosis not present

## 2018-04-11 DIAGNOSIS — R278 Other lack of coordination: Secondary | ICD-10-CM | POA: Diagnosis not present

## 2018-04-11 LAB — BASIC METABOLIC PANEL
BUN: 45 — AB (ref 4–21)
Creatinine: 0.8 (ref 0.5–1.1)
Glucose: 109
Potassium: 4.4 (ref 3.4–5.3)
Sodium: 141 (ref 137–147)

## 2018-04-11 LAB — POCT INR: INR: 2.6 — AB (ref 0.9–1.1)

## 2018-04-11 NOTE — Progress Notes (Signed)
Location:  Occupational psychologist of Service:  SNF (31) Provider:   Cindi Carbon, [place] [phone]   Shon Baton, MD  Patient Care Team: Shon Baton, MD as PCP - General (Internal Medicine) Sanda Klein, MD as PCP - Cardiology (Cardiology)  Extended Emergency Contact Information Primary Emergency Contact: [name] of [place] Phone: [phone] Work Phone: [phone] Mobile Phone: [phone] Relation: Daughter Secondary Emergency Contact: Nabiha, [place] Work Phone: [phone] Mobile Phone: [phone] Relation: Son  Code Status:  DNR Goals of care: Advanced Directive information Advanced Directives 03/29/2018  Does Patient Have a Medical Advance Directive? Yes  Type of Paramedic of [place];Living will  Does patient want to make changes to medical advance directive? No - Patient declined  Copy of [place] in Chart? Yes - validated most recent copy scanned in chart (See row information)  Would patient like information on creating a medical advance directive? No - Patient declined  Pre-existing out of facility DNR order (yellow form or pink MOST form) -     Chief Complaint  Patient presents with  . Acute Visit    sob and weight gain    HPI:  Pt is a 85 y.o. female seen today for an acute visit for shortness of breath (worse than baseline) and weight gain. PMH significant for cor pulmonale, COPD with FEV1 45% predicted, afib on chronic coag, CKD,pulmonary HTN, and possible diastolic CHF.  She is currently residing in skilled rehab after a hospitalization(03/14/18-03/15/18) for acute resp failure associated with COPD exacerbation. She was treated with prednisone, nebs, and doxycycline. Lasix was held during her hospitalization as she would felt to be dry and resumed at 40 mg qd. She has gained 9 lbs in the past two weeks. She was responsive to torsemide in a  short course prescribed one week ago and lost two lbs but once the med was stopped her edema and sob worsened. Her BMP returned 04/11/2018 BUN had risen to 44.6 from 36.9 and Cr was stable at 0.82.  She reports her cough is slightly worse with clear sputum. Present mostly during the day and not at night. She has baseline DOE with exertion but feels that her symptoms are worse. She continues to work with therapy and ambulate with a walker. She would like to go home but needs to improve medically to do so.  She is also receiving compression wraps to her legs for edema.   Wt Readings from Last 3 Encounters:  04/11/18 136 lb (61.7 kg)  04/04/18 135 lb 12.8 oz (61.6 kg)  03/29/18 132 lb (59.9 kg)   Past Medical History:  Diagnosis Date  . Breast cancer ([place])    left   . Chronic anticoagulation   . Chronic atrial fibrillation   . Hypertension   . Pelvic fracture ([place]) 05/2016   Past Surgical History:  Procedure Laterality Date  . ABDOMINAL HYSTERECTOMY    . BREAST LUMPECTOMY      Allergies  Allergen Reactions  . Aspirin     Upset stomach  . Codeine Nausea Only    Nausea     Outpatient Encounter Medications as of 04/11/2018  Medication Sig  . acetaminophen (TYLENOL) 500 MG tablet Take 1,000 mg by mouth 3 (three) times daily.   Marland Kitchen allopurinol (ZYLOPRIM) 100 MG tablet Take 100 mg by mouth at bedtime.   . ALPRAZolam (XANAX) 0.5 MG tablet Take 0.5 mg by mouth at bedtime.   Marland Kitchen  benzonatate (TESSALON) 100 MG capsule Take 1 capsule (100 mg total) by mouth 3 (three) times daily as needed for cough.  . Calcium Carbonate-Vitamin D3 (CALCIUM 600-D) 600-400 MG-UNIT TABS Take 1 tablet by mouth daily.  Marland Kitchen dextromethorphan (DELSYM) 30 MG/5ML liquid Take 30 mg by mouth every 12 (twelve) hours as needed for cough.  . feeding supplement (BOOST HIGH PROTEIN) LIQD Take 1 Container by mouth 2 (two) times daily between meals.  . furosemide (LASIX) 40 MG tablet Take 1 tablet (40 mg total) by mouth daily.  Marland Kitchen  guaiFENesin (MUCINEX) 600 MG 12 hr tablet Take 600 mg by mouth 2 (two) times daily.  Marland Kitchen ipratropium (ATROVENT) 0.02 % nebulizer solution Take 2.5 mLs (0.5 mg total) by nebulization 3 (three) times daily.  Marland Kitchen levalbuterol (XOPENEX) 1.25 MG/0.5ML nebulizer solution Take 1.25 mg by nebulization 3 (three) times daily.  . metoprolol succinate (TOPROL-XL) 50 MG 24 hr tablet Take 1 tablet (50 mg total) by mouth daily.  . potassium chloride (K-DUR) 10 MEQ tablet Take 1 tablet (10 mEq total) by mouth every Monday, Wednesday, and Friday.  . warfarin (COUMADIN) 2 MG tablet Take 2-4 mg by mouth daily. Pt takes one tablet daily (2 mg) daily except for on Thursday pt takes two tablets (4 mg)   No facility-administered encounter medications on file as of 04/11/2018.     Review of Systems  Constitutional: Positive for activity change and unexpected weight change. Negative for appetite change, chills, diaphoresis, fatigue and fever.  HENT: Negative for congestion, rhinorrhea, sinus pressure, sinus pain, trouble swallowing and voice change.   Respiratory: Positive for cough and shortness of breath. Negative for wheezing.   Cardiovascular: Positive for leg swelling. Negative for chest pain and palpitations.  Gastrointestinal: Positive for constipation. Negative for abdominal distention, abdominal pain and diarrhea.  Genitourinary: Negative for difficulty urinating and dysuria.  Musculoskeletal: Positive for arthralgias and gait problem. Negative for back pain, joint swelling and myalgias.  Neurological: Negative for dizziness, tremors, seizures, syncope, facial asymmetry, speech difficulty, weakness, light-headedness, numbness and headaches.  Psychiatric/Behavioral: Negative for agitation, behavioral problems and confusion.    Immunization History  Administered Date(s) Administered  . Influenza Split 12/07/2014  . Influenza-Unspecified 11/05/2013  . Pneumococcal Polysaccharide-23 03/02/2012  . Tdap 03/02/2012    . Zoster 03/03/2011   Pertinent  Health Maintenance Due  Topic Date Due  . PNA vac Low Risk Adult (2 of 2 - PCV13) 03/02/2013  . INFLUENZA VACCINE  09/30/2017  . DEXA SCAN  Completed   No flowsheet data found. Functional Status Survey:    Vitals:   04/11/18 1536  BP: (!) 142/91  Pulse: 79  Weight: 136 lb (61.7 kg)   Body mass index is 23.34 kg/m. Physical Exam Constitutional:      General: She is not in acute distress.    Appearance: She is not diaphoretic.  HENT:     Head: Normocephalic and atraumatic.     Nose: Nose normal. No congestion.     Mouth/Throat:     Mouth: Mucous membranes are moist.     Pharynx: Oropharynx is clear. No oropharyngeal exudate.  Neck:     Vascular: No JVD.  Cardiovascular:     Rate and Rhythm: Normal rate. Rhythm irregular.     Heart sounds: No murmur.  Pulmonary:     Effort: Pulmonary effort is normal. No respiratory distress.     Breath sounds: Normal breath sounds. No wheezing.     Comments: Decreased bases and slight increased work  of breathing Abdominal:     General: Abdomen is flat. Bowel sounds are normal.     Palpations: Abdomen is soft.  Musculoskeletal:     Right lower leg: Edema present.     Left lower leg: Edema present.  Lymphadenopathy:     Cervical: No cervical adenopathy.  Skin:    General: Skin is warm and dry.  Neurological:     General: No focal deficit present.     Mental Status: She is alert and oriented to person, place, and time. Mental status is at baseline.     Labs reviewed: Recent Labs    11/18/17 1832 11/19/17 0231 03/14/18 1407 03/15/18 0820 03/21/18 0300 04/11/18  NA  --  139 139 135 139 141  K  --  3.2* 4.5 4.2 4.3 4.4  CL  --  96* 96* 97*  --   --   CO2  --  _0 --   --   GLUCOSE  --  151* 136* 141*  --   --   BUN  --  22 45* 51* 37* 45*  CREATININE  --  1.12* 1.23* 1.05* 0.8 0.8  CALCIUM  --  9.1 9.6 8.9  --   --   MG 1.8  --   --   --   --   --   PHOS 3.2  --   --   --   --    --    Recent Labs    11/18/17 1259 03/15/18 0820  AST 26 31  ALT 16 20  ALKPHOS 108 81  BILITOT 4.9* 1.2  PROT 7.0 6.3*  ALBUMIN 3.7 3.4*   Recent Labs    11/19/17 0231 03/14/18 1407 03/15/18 0820 03/21/18 0300  WBC 15.5* 10.9* 10.5 10.9  NEUTROABS  --  7.9*  --   --   HGB 13.2 15.3* 13.5 14.5  HCT 38.9 47.4* 40.6 43  MCV 107.2* 111.0* 109.1*  --   PLT 150 164 146* 205   Lab Results  Component Value Date   TSH 1.602 11/18/2017   No results found for: HGBA1C No results found for: CHOL, HDL, LDLCALC, LDLDIRECT, TRIG, CHOLHDL  Significant Diagnostic Results in last 30 days:  Dg Chest 2 View  Result Date: 03/14/2018 CLINICAL DATA:  Cough. EXAM: CHEST - 2 VIEW COMPARISON:  Radiographs of November 19, 2017. FINDINGS: Stable cardiomegaly. No pneumothorax or pleural effusion is noted. Both lungs are clear. The visualized skeletal structures are unremarkable. IMPRESSION: No active cardiopulmonary disease. Electronically Signed   By: Marijo Conception, M.D.   On: 03/14/2018 13:40    Assessment/Plan  1. PAH (pulmonary artery hypertension) ([place]) With right HF and weight gain Increase lasix back to 80 mg qd (previous dose) with no stop date We have been avoiding aggressive diuresis as she has a propensity for dehydration but her weight, edema, and sob have worsened. Will obtain BMP in 1 week. Electrolytes reviewed today are WNL If no improvement will consult with cardiology ?change to torsemide or add aldactone Continue compression wraps and daily weights    2. Pulmonary emphysema, unspecified emphysema type ([place]) Has had two exacerbations in the past year. Currently on neb treatments only. Will schedule follow up with Dr. Lamonte Sakai for additional input from pulmonary  3. Chronic respiratory failure with hypoxia (HCC) Continue oxygen at 2 liters [place] needed for the above hx  4. Long term (current) use of anticoagulants Continue current dose of coumadin for afib CVA risk  prevention Recheck INR in 1 week   5. CKD (chronic kidney disease), stage III ([place]) Continue to periodically monitor BMP and avoid nephrotoxic agents    Family/ staff Communication: discussed with resident and staff  Labs/tests ordered:  BMP in 1 week

## 2018-04-12 DIAGNOSIS — I4819 Other persistent atrial fibrillation: Secondary | ICD-10-CM | POA: Diagnosis not present

## 2018-04-12 DIAGNOSIS — J441 Chronic obstructive pulmonary disease with (acute) exacerbation: Secondary | ICD-10-CM | POA: Diagnosis not present

## 2018-04-12 DIAGNOSIS — R278 Other lack of coordination: Secondary | ICD-10-CM | POA: Diagnosis not present

## 2018-04-12 DIAGNOSIS — R0602 Shortness of breath: Secondary | ICD-10-CM | POA: Diagnosis not present

## 2018-04-12 DIAGNOSIS — M6389 Disorders of muscle in diseases classified elsewhere, multiple sites: Secondary | ICD-10-CM | POA: Diagnosis not present

## 2018-04-12 DIAGNOSIS — R2689 Other abnormalities of gait and mobility: Secondary | ICD-10-CM | POA: Diagnosis not present

## 2018-04-13 DIAGNOSIS — R2689 Other abnormalities of gait and mobility: Secondary | ICD-10-CM | POA: Diagnosis not present

## 2018-04-13 DIAGNOSIS — R278 Other lack of coordination: Secondary | ICD-10-CM | POA: Diagnosis not present

## 2018-04-13 DIAGNOSIS — R0602 Shortness of breath: Secondary | ICD-10-CM | POA: Diagnosis not present

## 2018-04-13 DIAGNOSIS — J441 Chronic obstructive pulmonary disease with (acute) exacerbation: Secondary | ICD-10-CM | POA: Diagnosis not present

## 2018-04-13 DIAGNOSIS — I4819 Other persistent atrial fibrillation: Secondary | ICD-10-CM | POA: Diagnosis not present

## 2018-04-13 DIAGNOSIS — M6389 Disorders of muscle in diseases classified elsewhere, multiple sites: Secondary | ICD-10-CM | POA: Diagnosis not present

## 2018-04-14 ENCOUNTER — Other Ambulatory Visit: Payer: Self-pay | Admitting: Adult Health

## 2018-04-14 DIAGNOSIS — R2689 Other abnormalities of gait and mobility: Secondary | ICD-10-CM | POA: Diagnosis not present

## 2018-04-14 DIAGNOSIS — J441 Chronic obstructive pulmonary disease with (acute) exacerbation: Secondary | ICD-10-CM | POA: Diagnosis not present

## 2018-04-14 DIAGNOSIS — R0602 Shortness of breath: Secondary | ICD-10-CM | POA: Diagnosis not present

## 2018-04-14 DIAGNOSIS — M6389 Disorders of muscle in diseases classified elsewhere, multiple sites: Secondary | ICD-10-CM | POA: Diagnosis not present

## 2018-04-14 DIAGNOSIS — I4819 Other persistent atrial fibrillation: Secondary | ICD-10-CM | POA: Diagnosis not present

## 2018-04-14 DIAGNOSIS — R278 Other lack of coordination: Secondary | ICD-10-CM | POA: Diagnosis not present

## 2018-04-14 MED ORDER — ALPRAZOLAM 0.5 MG PO TABS: 0.5000 mg | ORAL_TABLET | Freq: Every day | ORAL | 2 refills | Status: DC

## 2018-04-18 DIAGNOSIS — Z7901 Long term (current) use of anticoagulants: Secondary | ICD-10-CM | POA: Diagnosis not present

## 2018-04-18 DIAGNOSIS — D649 Anemia, unspecified: Secondary | ICD-10-CM | POA: Diagnosis not present

## 2018-04-18 DIAGNOSIS — I4891 Unspecified atrial fibrillation: Secondary | ICD-10-CM | POA: Diagnosis not present

## 2018-04-18 DIAGNOSIS — I129 Hypertensive chronic kidney disease with stage 1 through stage 4 chronic kidney disease, or unspecified chronic kidney disease: Secondary | ICD-10-CM | POA: Diagnosis not present

## 2018-04-18 LAB — BASIC METABOLIC PANEL
BUN: 44 — AB (ref 4–21)
Creatinine: 1 (ref 0.5–1.1)
Glucose: 103
Potassium: 4.1 (ref 3.4–5.3)
SODIUM: 144 (ref 137–147)

## 2018-04-19 DIAGNOSIS — J441 Chronic obstructive pulmonary disease with (acute) exacerbation: Secondary | ICD-10-CM | POA: Diagnosis not present

## 2018-04-19 DIAGNOSIS — I4819 Other persistent atrial fibrillation: Secondary | ICD-10-CM | POA: Diagnosis not present

## 2018-04-19 DIAGNOSIS — R0602 Shortness of breath: Secondary | ICD-10-CM | POA: Diagnosis not present

## 2018-04-19 DIAGNOSIS — R278 Other lack of coordination: Secondary | ICD-10-CM | POA: Diagnosis not present

## 2018-04-19 DIAGNOSIS — R2689 Other abnormalities of gait and mobility: Secondary | ICD-10-CM | POA: Diagnosis not present

## 2018-04-19 DIAGNOSIS — M6389 Disorders of muscle in diseases classified elsewhere, multiple sites: Secondary | ICD-10-CM | POA: Diagnosis not present

## 2018-04-20 DIAGNOSIS — J441 Chronic obstructive pulmonary disease with (acute) exacerbation: Secondary | ICD-10-CM | POA: Diagnosis not present

## 2018-04-20 DIAGNOSIS — M6389 Disorders of muscle in diseases classified elsewhere, multiple sites: Secondary | ICD-10-CM | POA: Diagnosis not present

## 2018-04-20 DIAGNOSIS — R0602 Shortness of breath: Secondary | ICD-10-CM | POA: Diagnosis not present

## 2018-04-20 DIAGNOSIS — I4819 Other persistent atrial fibrillation: Secondary | ICD-10-CM | POA: Diagnosis not present

## 2018-04-20 DIAGNOSIS — R278 Other lack of coordination: Secondary | ICD-10-CM | POA: Diagnosis not present

## 2018-04-20 DIAGNOSIS — R2689 Other abnormalities of gait and mobility: Secondary | ICD-10-CM | POA: Diagnosis not present

## 2018-04-21 DIAGNOSIS — M6389 Disorders of muscle in diseases classified elsewhere, multiple sites: Secondary | ICD-10-CM | POA: Diagnosis not present

## 2018-04-21 DIAGNOSIS — R278 Other lack of coordination: Secondary | ICD-10-CM | POA: Diagnosis not present

## 2018-04-21 DIAGNOSIS — R2689 Other abnormalities of gait and mobility: Secondary | ICD-10-CM | POA: Diagnosis not present

## 2018-04-21 DIAGNOSIS — J441 Chronic obstructive pulmonary disease with (acute) exacerbation: Secondary | ICD-10-CM | POA: Diagnosis not present

## 2018-04-21 DIAGNOSIS — I4819 Other persistent atrial fibrillation: Secondary | ICD-10-CM | POA: Diagnosis not present

## 2018-04-21 DIAGNOSIS — R0602 Shortness of breath: Secondary | ICD-10-CM | POA: Diagnosis not present

## 2018-04-22 ENCOUNTER — Institutional Professional Consult (permissible substitution): Payer: Medicare Other | Admitting: Emergency Medicine

## 2018-04-22 DIAGNOSIS — M6389 Disorders of muscle in diseases classified elsewhere, multiple sites: Secondary | ICD-10-CM | POA: Diagnosis not present

## 2018-04-22 DIAGNOSIS — R278 Other lack of coordination: Secondary | ICD-10-CM | POA: Diagnosis not present

## 2018-04-22 DIAGNOSIS — I4819 Other persistent atrial fibrillation: Secondary | ICD-10-CM | POA: Diagnosis not present

## 2018-04-22 DIAGNOSIS — R0602 Shortness of breath: Secondary | ICD-10-CM | POA: Diagnosis not present

## 2018-04-22 DIAGNOSIS — R2689 Other abnormalities of gait and mobility: Secondary | ICD-10-CM | POA: Diagnosis not present

## 2018-04-22 DIAGNOSIS — J441 Chronic obstructive pulmonary disease with (acute) exacerbation: Secondary | ICD-10-CM | POA: Diagnosis not present

## 2018-04-25 DIAGNOSIS — J441 Chronic obstructive pulmonary disease with (acute) exacerbation: Secondary | ICD-10-CM | POA: Diagnosis not present

## 2018-04-25 DIAGNOSIS — R278 Other lack of coordination: Secondary | ICD-10-CM | POA: Diagnosis not present

## 2018-04-25 DIAGNOSIS — I4819 Other persistent atrial fibrillation: Secondary | ICD-10-CM | POA: Diagnosis not present

## 2018-04-25 DIAGNOSIS — R2689 Other abnormalities of gait and mobility: Secondary | ICD-10-CM | POA: Diagnosis not present

## 2018-04-25 DIAGNOSIS — R0602 Shortness of breath: Secondary | ICD-10-CM | POA: Diagnosis not present

## 2018-04-25 DIAGNOSIS — I5022 Chronic systolic (congestive) heart failure: Secondary | ICD-10-CM | POA: Diagnosis not present

## 2018-04-25 DIAGNOSIS — I4891 Unspecified atrial fibrillation: Secondary | ICD-10-CM | POA: Diagnosis not present

## 2018-04-25 DIAGNOSIS — M6389 Disorders of muscle in diseases classified elsewhere, multiple sites: Secondary | ICD-10-CM | POA: Diagnosis not present

## 2018-04-26 DIAGNOSIS — R2689 Other abnormalities of gait and mobility: Secondary | ICD-10-CM | POA: Diagnosis not present

## 2018-04-26 DIAGNOSIS — J441 Chronic obstructive pulmonary disease with (acute) exacerbation: Secondary | ICD-10-CM | POA: Diagnosis not present

## 2018-04-26 DIAGNOSIS — R0602 Shortness of breath: Secondary | ICD-10-CM | POA: Diagnosis not present

## 2018-04-26 DIAGNOSIS — I482 Chronic atrial fibrillation, unspecified: Secondary | ICD-10-CM | POA: Diagnosis not present

## 2018-04-26 DIAGNOSIS — M6389 Disorders of muscle in diseases classified elsewhere, multiple sites: Secondary | ICD-10-CM | POA: Diagnosis not present

## 2018-04-26 DIAGNOSIS — R278 Other lack of coordination: Secondary | ICD-10-CM | POA: Diagnosis not present

## 2018-04-26 DIAGNOSIS — I4819 Other persistent atrial fibrillation: Secondary | ICD-10-CM | POA: Diagnosis not present

## 2018-04-26 LAB — PROTIME-INR: Protime: 23.1 — AB (ref 10.0–13.8)

## 2018-04-26 LAB — POCT INR: INR: 2.1 — AB (ref 0.9–1.1)

## 2018-04-27 DIAGNOSIS — R278 Other lack of coordination: Secondary | ICD-10-CM | POA: Diagnosis not present

## 2018-04-27 DIAGNOSIS — R2689 Other abnormalities of gait and mobility: Secondary | ICD-10-CM | POA: Diagnosis not present

## 2018-04-27 DIAGNOSIS — R0602 Shortness of breath: Secondary | ICD-10-CM | POA: Diagnosis not present

## 2018-04-27 DIAGNOSIS — I4819 Other persistent atrial fibrillation: Secondary | ICD-10-CM | POA: Diagnosis not present

## 2018-04-27 DIAGNOSIS — J441 Chronic obstructive pulmonary disease with (acute) exacerbation: Secondary | ICD-10-CM | POA: Diagnosis not present

## 2018-04-27 DIAGNOSIS — M6389 Disorders of muscle in diseases classified elsewhere, multiple sites: Secondary | ICD-10-CM | POA: Diagnosis not present

## 2018-04-27 DIAGNOSIS — I482 Chronic atrial fibrillation, unspecified: Secondary | ICD-10-CM | POA: Diagnosis not present

## 2018-04-28 ENCOUNTER — Encounter: Payer: Self-pay | Admitting: Adult Health

## 2018-04-28 ENCOUNTER — Non-Acute Institutional Stay (SKILLED_NURSING_FACILITY): Payer: Medicare Other | Admitting: Adult Health

## 2018-04-28 DIAGNOSIS — J441 Chronic obstructive pulmonary disease with (acute) exacerbation: Secondary | ICD-10-CM | POA: Diagnosis not present

## 2018-04-28 DIAGNOSIS — J439 Emphysema, unspecified: Secondary | ICD-10-CM

## 2018-04-28 DIAGNOSIS — M6389 Disorders of muscle in diseases classified elsewhere, multiple sites: Secondary | ICD-10-CM | POA: Diagnosis not present

## 2018-04-28 DIAGNOSIS — I2721 Secondary pulmonary arterial hypertension: Secondary | ICD-10-CM

## 2018-04-28 DIAGNOSIS — J9611 Chronic respiratory failure with hypoxia: Secondary | ICD-10-CM

## 2018-04-28 DIAGNOSIS — M25511 Pain in right shoulder: Secondary | ICD-10-CM | POA: Diagnosis not present

## 2018-04-28 DIAGNOSIS — R2689 Other abnormalities of gait and mobility: Secondary | ICD-10-CM | POA: Diagnosis not present

## 2018-04-28 DIAGNOSIS — I4819 Other persistent atrial fibrillation: Secondary | ICD-10-CM | POA: Diagnosis not present

## 2018-04-28 DIAGNOSIS — R278 Other lack of coordination: Secondary | ICD-10-CM | POA: Diagnosis not present

## 2018-04-28 DIAGNOSIS — L989 Disorder of the skin and subcutaneous tissue, unspecified: Secondary | ICD-10-CM | POA: Diagnosis not present

## 2018-04-28 DIAGNOSIS — R0602 Shortness of breath: Secondary | ICD-10-CM | POA: Diagnosis not present

## 2018-04-28 NOTE — Progress Notes (Signed)
Location:  Occupational psychologist of Service:  SNF (31) Provider:   Cindi Carbon, ANP [place] [phone]   [name], DO  Patient Care Team: Shon Baton, MD as PCP - General (Internal Medicine) Sanda Klein, MD as PCP - Cardiology (Cardiology)  Extended Emergency Contact Information Primary Emergency Contact: [name] of [place] Phone: [phone] Work Phone: [phone] Mobile Phone: [phone] Relation: Daughter Secondary Emergency Contact: Kanasia, [place] Work Phone: [phone] Mobile Phone: [phone] Relation: Son  Code Status:  DNR Goals of care: Advanced Directive information Advanced Directives 03/29/2018  Does Patient Have a Medical Advance Directive? Yes  Type of Paramedic of [place];Living will  Does patient want to make changes to medical advance directive? No - Patient declined  Copy of [place] in Chart? Yes - validated most recent copy scanned in chart (See row information)  Would patient like information on creating a medical advance directive? No - Patient declined  Pre-existing out of facility DNR order (yellow form or pink MOST form) -     Chief Complaint  Patient presents with  . Medical Management of Chronic Issues    HPI:  Pt is a 85 y.o. female seen today for medical management of chronic diseases.  She moved to skilled care and has adjusted well. She had a COPD exac on 03/14/18 and after that was weak and it was felt she would do best in the skilled setting.  She has a hx of COPD (FEV1 45%) and pulmonary htn (PA pressure 52). She is now on an inogen oxygen tank. She reports mild DOE with exertion but feels its better than a week ago. She was placed on torsemide for weight gain and has lost 8 lbs. Her edema has improved with torsemide and leg wraps. She does not have a cough or sputum production at this time.  Wt Readings from Last  3 Encounters:  04/28/18 127 lb 3.2 oz (57.7 kg)  04/11/18 136 lb (61.7 kg)  04/04/18 135 lb 12.8 oz (61.6 kg)    She has had right shoulder pain which was acute in nature but with no clear injury. She is using voltaren gel and tylenol which helps some. She has trouble raising her arm or moving her walker due to pain. At times it wakes her up from sleep.   Right shoulder xray 04/04/18 osteophyte from the humeral head c/w degenerative change no fracture. Very mild atrophy of the [facility] joint without separation noted.  Currently on coumadin for afib. No cp, palpitations or acute change in vitals.  Lab Results  Component Value Date   INR 2.1 (A) 04/26/2018   INR 2.6 (A) 04/11/2018   INR 2.6 (A) 03/28/2018   PROTIME 23.1 (A) 04/26/2018   PROTIME 27.4 (A) 03/28/2018   PROTIME 24.6 (A) 03/16/2018       Past Medical History:  Diagnosis Date  . Breast cancer ([place])    left   . Chronic anticoagulation   . Chronic atrial fibrillation   . Hypertension   . Pelvic fracture ([place]) 05/2016   Past Surgical History:  Procedure Laterality Date  . ABDOMINAL HYSTERECTOMY    . BREAST LUMPECTOMY      Allergies  Allergen Reactions  . Aspirin     Upset stomach  . Codeine Nausea Only    Nausea     Outpatient Encounter Medications as of 04/28/2018  Medication Sig  . acetaminophen (TYLENOL) 500 MG tablet  Take 1,000 mg by mouth at bedtime.   Marland Kitchen allopurinol (ZYLOPRIM) 100 MG tablet Take 100 mg by mouth at bedtime.   . ALPRAZolam (XANAX) 0.5 MG tablet Take 1 tablet (0.5 mg total) by mouth at bedtime.  . benzonatate (TESSALON) 100 MG capsule Take 1 capsule (100 mg total) by mouth 3 (three) times daily as needed for cough.  . Calcium Carbonate-Vitamin D3 (CALCIUM 600-D) 600-400 MG-UNIT TABS Take 1 tablet by mouth daily.  Marland Kitchen dextromethorphan (DELSYM) 30 MG/5ML liquid Take 30 mg by mouth every 12 (twelve) hours as needed for cough.  . diclofenac sodium (VOLTAREN) 1 % GEL Apply 2 g topically 4 (four) times  daily. Right shoulder  . docusate sodium (COLACE) 100 MG capsule Take 100 mg by mouth at bedtime.  . feeding supplement (BOOST HIGH PROTEIN) LIQD Take 1 Container by mouth 2 (two) times daily between meals.  Marland Kitchen guaiFENesin (MUCINEX) 600 MG 12 hr tablet Take 600 mg by mouth 2 (two) times daily.  Marland Kitchen ipratropium (ATROVENT) 0.02 % nebulizer solution Take 2.5 mLs (0.5 mg total) by nebulization 3 (three) times daily.  Marland Kitchen levalbuterol (XOPENEX) 1.25 MG/0.5ML nebulizer solution Take 1.25 mg by nebulization 3 (three) times daily.  . metoprolol succinate (TOPROL-XL) 50 MG 24 hr tablet Take 1 tablet (50 mg total) by mouth daily.  . potassium chloride SA (K-DUR,KLOR-CON) 20 MEQ tablet Take 20 mEq by mouth daily.  Marland Kitchen torsemide (DEMADEX) 20 MG tablet Take 40 mg by mouth daily.  Marland Kitchen warfarin (COUMADIN) 2 MG tablet Take 2-4 mg by mouth daily. Pt takes one tablet daily (2 mg) daily except for on Thursday pt takes two tablets (4 mg)  . [DISCONTINUED] furosemide (LASIX) 40 MG tablet Take 1 tablet (40 mg total) by mouth daily.  . [DISCONTINUED] potassium chloride (K-DUR) 10 MEQ tablet Take 1 tablet (10 mEq total) by mouth every Monday, Wednesday, and Friday.   No facility-administered encounter medications on file as of 04/28/2018.     Review of Systems  Constitutional: Negative for activity change, appetite change, chills, diaphoresis, fatigue, fever and unexpected weight change.  HENT: Negative for congestion.   Respiratory: Positive for shortness of breath. Negative for cough and wheezing.   Cardiovascular: Positive for leg swelling. Negative for chest pain and palpitations.  Gastrointestinal: Negative for abdominal distention, abdominal pain, constipation and diarrhea.  Genitourinary: Negative for difficulty urinating and dysuria.  Musculoskeletal: Positive for arthralgias and gait problem. Negative for back pain, joint swelling and myalgias.  Skin:       Skin lesion on chest  Neurological: Negative for  dizziness, tremors, seizures, syncope, facial asymmetry, speech difficulty, weakness, light-headedness, numbness and headaches.  Psychiatric/Behavioral: Negative for agitation, behavioral problems and confusion.    Immunization History  Administered Date(s) Administered  . Influenza Split 12/07/2014  . Influenza-Unspecified 11/05/2013  . Pneumococcal Polysaccharide-23 03/02/2012  . Tdap 03/02/2012  . Zoster 03/03/2011   Pertinent  Health Maintenance Due  Topic Date Due  . PNA vac Low Risk Adult (2 of 2 - PCV13) 03/02/2013  . INFLUENZA VACCINE  09/30/2017  . DEXA SCAN  Completed   No flowsheet data found. Functional Status Survey:    Vitals:   04/28/18 1153  BP: 101/63  Pulse: 72  Resp: 18  Temp: (!) 97.5 F (36.4 C)  SpO2: 93%  Weight: 127 lb 3.2 oz (57.7 kg)   Body mass index is 21.83 kg/m. Physical Exam Vitals signs and nursing note reviewed.  Constitutional:      General: She is  not in acute distress.    Appearance: She is not diaphoretic.  HENT:     Head: Normocephalic and atraumatic.     Right Ear: External ear normal.     Left Ear: External ear normal.     Mouth/Throat:     Pharynx: No oropharyngeal exudate.  Eyes:     Conjunctiva/sclera: Conjunctivae normal.     Pupils: Pupils are equal, round, and reactive to light.  Neck:     Thyroid: No thyromegaly.     Vascular: No JVD.  Cardiovascular:     Rate and Rhythm: Normal rate. Rhythm irregular.     Heart sounds: Normal heart sounds. No murmur.  Pulmonary:     Effort: Pulmonary effort is normal. No respiratory distress.     Breath sounds: Normal breath sounds. No stridor.  Abdominal:     General: Bowel sounds are normal.     Palpations: Abdomen is soft.  Musculoskeletal: Normal range of motion.     Right lower leg: No edema.     Left lower leg: No edema.     Comments: Right shoulder without swelling, bruising or tenderness.Pain with AROM and pos empty can test.   Lymphadenopathy:     Cervical: No  cervical adenopathy.  Skin:    General: Skin is warm and dry.     Comments: Raised dark brown crusted skin lesion to chest with surrounding erythema.   Neurological:     General: No focal deficit present.     Mental Status: She is alert and oriented to person, place, and time. Mental status is at baseline.     Cranial Nerves: No cranial nerve deficit.  Psychiatric:        Mood and Affect: Mood normal.     Labs reviewed: Recent Labs    11/18/17 1832 11/19/17 0231 03/14/18 1407 03/15/18 0820 03/21/18 0300 04/11/18 04/18/18  NA  --  139 139 135 139 141 144  K  --  3.2* 4.5 4.2 4.3 4.4 4.1  CL  --  96* 96* 97*  --   --   --   CO2  --  _0 --   --   --   GLUCOSE  --  151* 136* 141*  --   --   --   BUN  --  22 45* 51* 37* 45* 44*  CREATININE  --  1.12* 1.23* 1.05* 0.8 0.8 1.0  CALCIUM  --  9.1 9.6 8.9  --   --   --   MG 1.8  --   --   --   --   --   --   PHOS 3.2  --   --   --   --   --   --    Recent Labs    11/18/17 1259 03/15/18 0820  AST 26 31  ALT 16 20  ALKPHOS 108 81  BILITOT 4.9* 1.2  PROT 7.0 6.3*  ALBUMIN 3.7 3.4*   Recent Labs    11/19/17 0231 03/14/18 1407 03/15/18 0820 03/21/18 0300  WBC 15.5* 10.9* 10.5 10.9  NEUTROABS  --  7.9*  --   --   HGB 13.2 15.3* 13.5 14.5  HCT 38.9 47.4* 40.6 43  MCV 107.2* 111.0* 109.1*  --   PLT 150 164 146* 205   Lab Results  Component Value Date   TSH 1.602 11/18/2017   No results found for: HGBA1C No results found for: CHOL, HDL, LDLCALC, LDLDIRECT, TRIG, CHOLHDL  Significant Diagnostic Results in last 30 days:  No results found.  Assessment/Plan 1. PAH (pulmonary artery hypertension) ([place]) Improved weight and symptoms of sob/edema Continue torsemide 40 mg qd and monitor bmp periodically  2. Chronic respiratory failure with hypoxia (HCC) Continuous oxygen at 2liters [place], pending pulmonary follow up  3. Pulmonary emphysema, unspecified emphysema type ([place]) Continue atrovent and xopenex tid. She has  had two exacerbations in the past year and we will appreciate recommendations with pulmonary regarding her current regimen.  4. Acute pain of right shoulder Due to probable rotator cuff injury and has underlying OA with osteophyte Increase the tylenol to 1 gram tid Discussed joint injection risk and benefit, she will think about it  5. Skin lesion Referral to dermatology for suspicious lesion    Family/ staff Communication: discussed with staff  Labs/tests ordered:  NA

## 2018-04-29 DIAGNOSIS — R2689 Other abnormalities of gait and mobility: Secondary | ICD-10-CM | POA: Diagnosis not present

## 2018-04-29 DIAGNOSIS — M6389 Disorders of muscle in diseases classified elsewhere, multiple sites: Secondary | ICD-10-CM | POA: Diagnosis not present

## 2018-04-29 DIAGNOSIS — R278 Other lack of coordination: Secondary | ICD-10-CM | POA: Diagnosis not present

## 2018-04-29 DIAGNOSIS — I4819 Other persistent atrial fibrillation: Secondary | ICD-10-CM | POA: Diagnosis not present

## 2018-04-29 DIAGNOSIS — R0602 Shortness of breath: Secondary | ICD-10-CM | POA: Diagnosis not present

## 2018-04-29 DIAGNOSIS — J441 Chronic obstructive pulmonary disease with (acute) exacerbation: Secondary | ICD-10-CM | POA: Diagnosis not present

## 2018-05-02 DIAGNOSIS — J9601 Acute respiratory failure with hypoxia: Secondary | ICD-10-CM | POA: Diagnosis not present

## 2018-05-02 DIAGNOSIS — R0602 Shortness of breath: Secondary | ICD-10-CM | POA: Diagnosis not present

## 2018-05-02 DIAGNOSIS — I4819 Other persistent atrial fibrillation: Secondary | ICD-10-CM | POA: Diagnosis not present

## 2018-05-02 DIAGNOSIS — N183 Chronic kidney disease, stage 3 (moderate): Secondary | ICD-10-CM | POA: Diagnosis not present

## 2018-05-02 DIAGNOSIS — M6389 Disorders of muscle in diseases classified elsewhere, multiple sites: Secondary | ICD-10-CM | POA: Diagnosis not present

## 2018-05-02 DIAGNOSIS — R0609 Other forms of dyspnea: Secondary | ICD-10-CM | POA: Diagnosis not present

## 2018-05-02 DIAGNOSIS — J441 Chronic obstructive pulmonary disease with (acute) exacerbation: Secondary | ICD-10-CM | POA: Diagnosis not present

## 2018-05-02 DIAGNOSIS — R2689 Other abnormalities of gait and mobility: Secondary | ICD-10-CM | POA: Diagnosis not present

## 2018-05-02 DIAGNOSIS — R278 Other lack of coordination: Secondary | ICD-10-CM | POA: Diagnosis not present

## 2018-05-03 ENCOUNTER — Institutional Professional Consult (permissible substitution): Payer: Medicare Other | Admitting: Emergency Medicine

## 2018-05-03 DIAGNOSIS — R0602 Shortness of breath: Secondary | ICD-10-CM | POA: Diagnosis not present

## 2018-05-03 DIAGNOSIS — J441 Chronic obstructive pulmonary disease with (acute) exacerbation: Secondary | ICD-10-CM | POA: Diagnosis not present

## 2018-05-03 DIAGNOSIS — M6389 Disorders of muscle in diseases classified elsewhere, multiple sites: Secondary | ICD-10-CM | POA: Diagnosis not present

## 2018-05-03 DIAGNOSIS — R2689 Other abnormalities of gait and mobility: Secondary | ICD-10-CM | POA: Diagnosis not present

## 2018-05-03 DIAGNOSIS — I4819 Other persistent atrial fibrillation: Secondary | ICD-10-CM | POA: Diagnosis not present

## 2018-05-03 DIAGNOSIS — R278 Other lack of coordination: Secondary | ICD-10-CM | POA: Diagnosis not present

## 2018-05-04 DIAGNOSIS — M6389 Disorders of muscle in diseases classified elsewhere, multiple sites: Secondary | ICD-10-CM | POA: Diagnosis not present

## 2018-05-04 DIAGNOSIS — I4819 Other persistent atrial fibrillation: Secondary | ICD-10-CM | POA: Diagnosis not present

## 2018-05-04 DIAGNOSIS — R278 Other lack of coordination: Secondary | ICD-10-CM | POA: Diagnosis not present

## 2018-05-04 DIAGNOSIS — R2689 Other abnormalities of gait and mobility: Secondary | ICD-10-CM | POA: Diagnosis not present

## 2018-05-04 DIAGNOSIS — R0602 Shortness of breath: Secondary | ICD-10-CM | POA: Diagnosis not present

## 2018-05-04 DIAGNOSIS — I482 Chronic atrial fibrillation, unspecified: Secondary | ICD-10-CM | POA: Diagnosis not present

## 2018-05-04 DIAGNOSIS — J441 Chronic obstructive pulmonary disease with (acute) exacerbation: Secondary | ICD-10-CM | POA: Diagnosis not present

## 2018-05-04 LAB — POCT INR: INR: 2 — AB (ref 0.9–1.1)

## 2018-05-04 LAB — PROTIME-INR

## 2018-05-05 DIAGNOSIS — R0602 Shortness of breath: Secondary | ICD-10-CM | POA: Diagnosis not present

## 2018-05-05 DIAGNOSIS — M6389 Disorders of muscle in diseases classified elsewhere, multiple sites: Secondary | ICD-10-CM | POA: Diagnosis not present

## 2018-05-05 DIAGNOSIS — J441 Chronic obstructive pulmonary disease with (acute) exacerbation: Secondary | ICD-10-CM | POA: Diagnosis not present

## 2018-05-05 DIAGNOSIS — R278 Other lack of coordination: Secondary | ICD-10-CM | POA: Diagnosis not present

## 2018-05-05 DIAGNOSIS — R2689 Other abnormalities of gait and mobility: Secondary | ICD-10-CM | POA: Diagnosis not present

## 2018-05-05 DIAGNOSIS — I4819 Other persistent atrial fibrillation: Secondary | ICD-10-CM | POA: Diagnosis not present

## 2018-05-06 ENCOUNTER — Ambulatory Visit (INDEPENDENT_AMBULATORY_CARE_PROVIDER_SITE_OTHER): Payer: Medicare Other | Admitting: Emergency Medicine

## 2018-05-06 ENCOUNTER — Encounter: Payer: Self-pay | Admitting: Emergency Medicine

## 2018-05-06 DIAGNOSIS — J9611 Chronic respiratory failure with hypoxia: Secondary | ICD-10-CM | POA: Diagnosis not present

## 2018-05-06 DIAGNOSIS — I2721 Secondary pulmonary arterial hypertension: Secondary | ICD-10-CM | POA: Diagnosis not present

## 2018-05-06 DIAGNOSIS — J441 Chronic obstructive pulmonary disease with (acute) exacerbation: Secondary | ICD-10-CM | POA: Diagnosis not present

## 2018-05-06 DIAGNOSIS — J439 Emphysema, unspecified: Secondary | ICD-10-CM | POA: Diagnosis not present

## 2018-05-06 DIAGNOSIS — M6389 Disorders of muscle in diseases classified elsewhere, multiple sites: Secondary | ICD-10-CM | POA: Diagnosis not present

## 2018-05-06 DIAGNOSIS — R0602 Shortness of breath: Secondary | ICD-10-CM | POA: Diagnosis not present

## 2018-05-06 DIAGNOSIS — I4819 Other persistent atrial fibrillation: Secondary | ICD-10-CM | POA: Diagnosis not present

## 2018-05-06 DIAGNOSIS — R278 Other lack of coordination: Secondary | ICD-10-CM | POA: Diagnosis not present

## 2018-05-06 DIAGNOSIS — R2689 Other abnormalities of gait and mobility: Secondary | ICD-10-CM | POA: Diagnosis not present

## 2018-05-06 NOTE — Assessment & Plan Note (Signed)
Severe obstruction noted on her prior pulmonary function testing.  She has associated severe pulmonary hypertension.  I talked her today about possibly changing her nebulized Xopenex/Atrovent to a long-acting bronchodilator such as Stiolto or Trelegy.  She told me that she would be more comfortable staying on the same regimen that she is using, at least for now.  We can reassess this going forward.  There may be some benefit to being on longer acting medications, avoiding hyperinflation

## 2018-05-06 NOTE — Assessment & Plan Note (Signed)
Severe PAH due to severe underlying emphysematous COPD, chronic hypoxemia, atrial fibrillation.  No intervention to be made other than ensuring that she is adequately oxygenated.

## 2018-05-06 NOTE — Patient Instructions (Signed)
Please continue Atrovent plus Xopenex nebulizer treatments 3 times a day on a schedule. You can use additional Xopenex nebulizer treatments up to every 6 hours if you need them for shortness of breath. Continue your oxygen at 2 to 3 L/min at all times.  We ensured today that there is humidity and your home concentrator. Try using nasal saline spray as needed to moisten your nose and avoid the dryness that can be caused by your oxygen. Try using Biotene mouthwash as needed to avoid mouth and throat dryness. Follow with Dr Lamonte Sakai in 6 months or sooner if you have any problems

## 2018-05-06 NOTE — Progress Notes (Signed)
Subjective:    Patient ID: [name], female    DOB: 1924-10-12, 85 y.o.   MRN: [id number]  HPI  Consult 05/06/2018 to re-establish care >> [name] is 88 with a minimal tobacco history, history of hypertension, A. fib, remote breast cancer.  I last saw her in 2016 for dyspnea.  She had pulmonary function testing 10/05/2014 that I reviewed that showed mixed obstruction and restriction with a borderline bronchodilator response, normal lung volumes (pseudonormalization), decreased diffusion capacity.  As above she also had some evidence for biatrial dilation and elevated pulmonary pressures on a prior echocardiogram.  Her most recent was done 06/15/2017 and I reviewed.  This confirms intact LV function, mild dilation of the LA, severe dilation of the RA and progressive PAH, 72 mmHg.  She is using Atrovent and Xopenex nebulized 3 times a day.  I reviewed the chart notes indicates that she had an acute exacerbation of COPD in September and also mid January.  She has been treated also for total body volume overload with torsemide.  She is been found to be hypoxemic and was prescribed oxygen at 2 -3 L/min continuous.  She is using it almost all the time, sleeps with it. No humidity to her knowledge. She is having nasal and throat dryness. Her cough is better. Breathing is still limiting.    Review of Systems  Constitutional: Negative for chills, fever and unexpected weight change.  HENT: Negative for congestion, dental problem, ear pain, nosebleeds, postnasal drip, rhinorrhea, sinus pressure, sneezing, sore throat, trouble swallowing and voice change.   Eyes: Negative for visual disturbance.  Respiratory: Positive for cough, shortness of breath and wheezing. Negative for choking.   Cardiovascular: Positive for chest pain. Negative for leg swelling.  Gastrointestinal: Negative for abdominal pain, diarrhea and vomiting.  Genitourinary: Negative for difficulty urinating.  Musculoskeletal: Negative for  arthralgias.  Skin: Negative for rash.  Neurological: Negative for tremors, syncope and headaches.  Hematological: Does not bruise/bleed easily.    Past Medical History:  Diagnosis Date  . Breast cancer ([place])    left   . Chronic anticoagulation   . Chronic atrial fibrillation   . Hypertension   . Pelvic fracture ([place]) 05/2016     Family History  Problem Relation Age of Onset  . Heart disease Mother   . Ulcers Father   . Colon cancer Father   . Asthma Sister   . Asthma Son      Social History   Socioeconomic History  . Marital status: Married    Spouse name: Not on file  . Number of children: Not on file  . Years of education: Not on file  . Highest education level: Not on file  Occupational History  . Not on file  Social Needs  . Financial resource strain: Not on file  . Food insecurity:    Worry: Not on file    Inability: Not on file  . Transportation needs:    Medical: Not on file    Non-medical: Not on file  Tobacco Use  . Smoking status: Former Smoker    Packs/day: 0.20    Years: 5.00    Pack years: 1.00    Types: Cigarettes  . Smokeless tobacco: Never Used  . Tobacco comment: pt has not smoked in 50 years  Substance and Sexual Activity  . Alcohol use: Yes    Alcohol/week: 2.0 standard drinks    Types: 2 Glasses of wine per week  . Drug use: No  .  Sexual activity: Not on file  Lifestyle  . Physical activity:    Days per week: Not on file    Minutes per session: Not on file  . Stress: Not on file  Relationships  . Social connections:    Talks on phone: Not on file    Gets together: Not on file    Attends religious service: Not on file    Active member of club or organization: Not on file    Attends meetings of clubs or organizations: Not on file    Relationship status: Not on file  . Intimate partner violence:    Fear of current or ex partner: Not on file    Emotionally abused: Not on file    Physically abused: Not on file    Forced sexual  activity: Not on file  Other Topics Concern  . Not on file  Social History Narrative  . Not on file     Allergies  Allergen Reactions  . Aspirin     Upset stomach  . Codeine Nausea Only    Nausea      Outpatient Medications Prior to Visit  Medication Sig Dispense Refill  . acetaminophen (TYLENOL) 500 MG tablet Take 1,000 mg by mouth at bedtime.     Marland Kitchen allopurinol (ZYLOPRIM) 100 MG tablet Take 100 mg by mouth at bedtime.     . ALPRAZolam (XANAX) 0.5 MG tablet Take 1 tablet (0.5 mg total) by mouth at bedtime. 30 tablet 2  . benzonatate (TESSALON) 100 MG capsule Take 1 capsule (100 mg total) by mouth 3 (three) times daily as needed for cough. 30 capsule 0  . Calcium Carbonate-Vitamin D3 (CALCIUM 600-D) 600-400 MG-UNIT TABS Take 1 tablet by mouth daily.    Marland Kitchen dextromethorphan (DELSYM) 30 MG/5ML liquid Take 30 mg by mouth every 12 (twelve) hours as needed for cough.    . diclofenac sodium (VOLTAREN) 1 % GEL Apply 2 g topically 4 (four) times daily. Right shoulder    . docusate sodium (COLACE) 100 MG capsule Take 100 mg by mouth at bedtime.    . feeding supplement (BOOST HIGH PROTEIN) LIQD Take 1 Container by mouth 2 (two) times daily between meals.    Marland Kitchen guaiFENesin (MUCINEX) 600 MG 12 hr tablet Take 600 mg by mouth 2 (two) times daily.    Marland Kitchen ipratropium (ATROVENT) 0.02 % nebulizer solution Take 2.5 mLs (0.5 mg total) by nebulization 3 (three) times daily. 75 mL 12  . levalbuterol (XOPENEX) 1.25 MG/0.5ML nebulizer solution Take 1.25 mg by nebulization 3 (three) times daily. 1 each 12  . metoprolol succinate (TOPROL-XL) 50 MG 24 hr tablet Take 1 tablet (50 mg total) by mouth daily. 90 tablet 3  . potassium chloride SA (K-DUR,KLOR-CON) 20 MEQ tablet Take 20 mEq by mouth daily.    Marland Kitchen torsemide (DEMADEX) 20 MG tablet Take 40 mg by mouth daily.    Marland Kitchen warfarin (COUMADIN) 2 MG tablet Take 2-4 mg by mouth daily. Pt takes one tablet daily (2 mg) daily except for on Thursday pt takes two tablets (4 mg)      No facility-administered medications prior to visit.          Objective:   Physical Exam Vitals:   05/06/18 1400  BP: 128/76  Pulse: 80  SpO2: 95%   Gen: Pleasant, elderly woman in a wheelchair, in no distress,  normal affect, on submental oxygen  ENT: No lesions,  mouth clear,  oropharynx clear, no postnasal drip  Neck: No  JVD, no stridor  Lungs: No use of accessory muscles, no wheeze, decreased at both bases  Cardiovascular: Irregular, no murmur  Musculoskeletal: No deformities, no cyanosis or clubbing  Neuro: alert, non focal  Skin: Warm, no lesions or rashes      Assessment & Plan:  Pulmonary emphysema (HCC) Severe obstruction noted on her prior pulmonary function testing.  She has associated severe pulmonary hypertension.  I talked her today about possibly changing her nebulized Xopenex/Atrovent to a long-acting bronchodilator such as Stiolto or Trelegy.  She told me that she would be more comfortable staying on the same regimen that she is using, at least for now.  We can reassess this going forward.  There may be some benefit to being on longer acting medications, avoiding hyperinflation  Chronic respiratory failure with hypoxia (HCC) Documented hypoxemia, now treating with 2 to 3 L/min.  She is having dryness in the nose and throat on the supplemental oxygen and we will try to treat this with nasal saline, Biotene mouthwash.  PAH (pulmonary artery hypertension) (HCC) Severe PAH due to severe underlying emphysematous COPD, chronic hypoxemia, atrial fibrillation.  No intervention to be made other than ensuring that she is adequately oxygenated.  Baltazar Apo, MD, PhD 05/06/2018, 2:38 PM [facility] [phone] or if no answer [phone]

## 2018-05-06 NOTE — Assessment & Plan Note (Signed)
Documented hypoxemia, now treating with 2 to 3 L/min.  She is having dryness in the nose and throat on the supplemental oxygen and we will try to treat this with nasal saline, Biotene mouthwash.

## 2018-05-08 DIAGNOSIS — I4819 Other persistent atrial fibrillation: Secondary | ICD-10-CM | POA: Diagnosis not present

## 2018-05-08 DIAGNOSIS — R2689 Other abnormalities of gait and mobility: Secondary | ICD-10-CM | POA: Diagnosis not present

## 2018-05-08 DIAGNOSIS — R0602 Shortness of breath: Secondary | ICD-10-CM | POA: Diagnosis not present

## 2018-05-08 DIAGNOSIS — R278 Other lack of coordination: Secondary | ICD-10-CM | POA: Diagnosis not present

## 2018-05-08 DIAGNOSIS — M6389 Disorders of muscle in diseases classified elsewhere, multiple sites: Secondary | ICD-10-CM | POA: Diagnosis not present

## 2018-05-08 DIAGNOSIS — J441 Chronic obstructive pulmonary disease with (acute) exacerbation: Secondary | ICD-10-CM | POA: Diagnosis not present

## 2018-05-09 DIAGNOSIS — I4819 Other persistent atrial fibrillation: Secondary | ICD-10-CM | POA: Diagnosis not present

## 2018-05-09 DIAGNOSIS — M6389 Disorders of muscle in diseases classified elsewhere, multiple sites: Secondary | ICD-10-CM | POA: Diagnosis not present

## 2018-05-09 DIAGNOSIS — J441 Chronic obstructive pulmonary disease with (acute) exacerbation: Secondary | ICD-10-CM | POA: Diagnosis not present

## 2018-05-09 DIAGNOSIS — R278 Other lack of coordination: Secondary | ICD-10-CM | POA: Diagnosis not present

## 2018-05-09 DIAGNOSIS — R2689 Other abnormalities of gait and mobility: Secondary | ICD-10-CM | POA: Diagnosis not present

## 2018-05-09 DIAGNOSIS — R0602 Shortness of breath: Secondary | ICD-10-CM | POA: Diagnosis not present

## 2018-05-11 DIAGNOSIS — R278 Other lack of coordination: Secondary | ICD-10-CM | POA: Diagnosis not present

## 2018-05-11 DIAGNOSIS — S20312A Abrasion of left front wall of thorax, initial encounter: Secondary | ICD-10-CM | POA: Diagnosis not present

## 2018-05-11 DIAGNOSIS — D485 Neoplasm of uncertain behavior of skin: Secondary | ICD-10-CM | POA: Diagnosis not present

## 2018-05-11 DIAGNOSIS — R2689 Other abnormalities of gait and mobility: Secondary | ICD-10-CM | POA: Diagnosis not present

## 2018-05-11 DIAGNOSIS — I4819 Other persistent atrial fibrillation: Secondary | ICD-10-CM | POA: Diagnosis not present

## 2018-05-11 DIAGNOSIS — L570 Actinic keratosis: Secondary | ICD-10-CM | POA: Diagnosis not present

## 2018-05-11 DIAGNOSIS — R0602 Shortness of breath: Secondary | ICD-10-CM | POA: Diagnosis not present

## 2018-05-11 DIAGNOSIS — M6389 Disorders of muscle in diseases classified elsewhere, multiple sites: Secondary | ICD-10-CM | POA: Diagnosis not present

## 2018-05-11 DIAGNOSIS — J441 Chronic obstructive pulmonary disease with (acute) exacerbation: Secondary | ICD-10-CM | POA: Diagnosis not present

## 2018-05-16 DIAGNOSIS — M6389 Disorders of muscle in diseases classified elsewhere, multiple sites: Secondary | ICD-10-CM | POA: Diagnosis not present

## 2018-05-16 DIAGNOSIS — I4819 Other persistent atrial fibrillation: Secondary | ICD-10-CM | POA: Diagnosis not present

## 2018-05-16 DIAGNOSIS — R0602 Shortness of breath: Secondary | ICD-10-CM | POA: Diagnosis not present

## 2018-05-16 DIAGNOSIS — R278 Other lack of coordination: Secondary | ICD-10-CM | POA: Diagnosis not present

## 2018-05-16 DIAGNOSIS — J441 Chronic obstructive pulmonary disease with (acute) exacerbation: Secondary | ICD-10-CM | POA: Diagnosis not present

## 2018-05-16 DIAGNOSIS — R2689 Other abnormalities of gait and mobility: Secondary | ICD-10-CM | POA: Diagnosis not present

## 2018-05-18 DIAGNOSIS — M6389 Disorders of muscle in diseases classified elsewhere, multiple sites: Secondary | ICD-10-CM | POA: Diagnosis not present

## 2018-05-18 DIAGNOSIS — J441 Chronic obstructive pulmonary disease with (acute) exacerbation: Secondary | ICD-10-CM | POA: Diagnosis not present

## 2018-05-18 DIAGNOSIS — R2689 Other abnormalities of gait and mobility: Secondary | ICD-10-CM | POA: Diagnosis not present

## 2018-05-18 DIAGNOSIS — I4819 Other persistent atrial fibrillation: Secondary | ICD-10-CM | POA: Diagnosis not present

## 2018-05-18 DIAGNOSIS — R0602 Shortness of breath: Secondary | ICD-10-CM | POA: Diagnosis not present

## 2018-05-18 DIAGNOSIS — R278 Other lack of coordination: Secondary | ICD-10-CM | POA: Diagnosis not present

## 2018-05-19 DIAGNOSIS — R2689 Other abnormalities of gait and mobility: Secondary | ICD-10-CM | POA: Diagnosis not present

## 2018-05-19 DIAGNOSIS — J441 Chronic obstructive pulmonary disease with (acute) exacerbation: Secondary | ICD-10-CM | POA: Diagnosis not present

## 2018-05-19 DIAGNOSIS — I4819 Other persistent atrial fibrillation: Secondary | ICD-10-CM | POA: Diagnosis not present

## 2018-05-19 DIAGNOSIS — M6389 Disorders of muscle in diseases classified elsewhere, multiple sites: Secondary | ICD-10-CM | POA: Diagnosis not present

## 2018-05-19 DIAGNOSIS — R0602 Shortness of breath: Secondary | ICD-10-CM | POA: Diagnosis not present

## 2018-05-19 DIAGNOSIS — R278 Other lack of coordination: Secondary | ICD-10-CM | POA: Diagnosis not present

## 2018-05-24 DIAGNOSIS — M6389 Disorders of muscle in diseases classified elsewhere, multiple sites: Secondary | ICD-10-CM | POA: Diagnosis not present

## 2018-05-24 DIAGNOSIS — J441 Chronic obstructive pulmonary disease with (acute) exacerbation: Secondary | ICD-10-CM | POA: Diagnosis not present

## 2018-05-24 DIAGNOSIS — R278 Other lack of coordination: Secondary | ICD-10-CM | POA: Diagnosis not present

## 2018-05-24 DIAGNOSIS — R2689 Other abnormalities of gait and mobility: Secondary | ICD-10-CM | POA: Diagnosis not present

## 2018-05-24 DIAGNOSIS — I4819 Other persistent atrial fibrillation: Secondary | ICD-10-CM | POA: Diagnosis not present

## 2018-05-24 DIAGNOSIS — R0602 Shortness of breath: Secondary | ICD-10-CM | POA: Diagnosis not present

## 2018-05-25 DIAGNOSIS — R278 Other lack of coordination: Secondary | ICD-10-CM | POA: Diagnosis not present

## 2018-05-25 DIAGNOSIS — M6389 Disorders of muscle in diseases classified elsewhere, multiple sites: Secondary | ICD-10-CM | POA: Diagnosis not present

## 2018-05-25 DIAGNOSIS — J441 Chronic obstructive pulmonary disease with (acute) exacerbation: Secondary | ICD-10-CM | POA: Diagnosis not present

## 2018-05-25 DIAGNOSIS — R0602 Shortness of breath: Secondary | ICD-10-CM | POA: Diagnosis not present

## 2018-05-25 DIAGNOSIS — I4819 Other persistent atrial fibrillation: Secondary | ICD-10-CM | POA: Diagnosis not present

## 2018-05-25 DIAGNOSIS — R2689 Other abnormalities of gait and mobility: Secondary | ICD-10-CM | POA: Diagnosis not present

## 2018-05-26 ENCOUNTER — Encounter: Payer: Self-pay | Admitting: Adult Health

## 2018-05-26 ENCOUNTER — Non-Acute Institutional Stay (SKILLED_NURSING_FACILITY): Payer: Medicare Other | Admitting: Adult Health

## 2018-05-26 DIAGNOSIS — J9611 Chronic respiratory failure with hypoxia: Secondary | ICD-10-CM

## 2018-05-26 DIAGNOSIS — M25511 Pain in right shoulder: Secondary | ICD-10-CM | POA: Diagnosis not present

## 2018-05-26 DIAGNOSIS — I2781 Cor pulmonale (chronic): Secondary | ICD-10-CM

## 2018-05-26 DIAGNOSIS — J439 Emphysema, unspecified: Secondary | ICD-10-CM | POA: Diagnosis not present

## 2018-05-26 DIAGNOSIS — N183 Chronic kidney disease, stage 3 unspecified: Secondary | ICD-10-CM

## 2018-05-26 DIAGNOSIS — I4821 Permanent atrial fibrillation: Secondary | ICD-10-CM

## 2018-05-26 NOTE — Progress Notes (Signed)
Location:  Occupational psychologist of Service:  SNF (31) Provider:   Cindi Carbon, ANP [place] [phone]   [name], DO  Patient Care Team: [name], DO as PCP - General (Geriatric Medicine) Sanda Klein, MD as PCP - Cardiology (Cardiology)  Extended Emergency Contact Information Primary Emergency Contact: [name] of [place] Phone: [phone] Work Phone: [phone] Mobile Phone: [phone] Relation: Daughter Secondary Emergency Contact: Ke[place], [place] Work Phone: [phone] Mobile Phone: [phone] Relation: Son  Code Status:  DNR Goals of care: Advanced Directive information Advanced Directives 03/29/2018  Does Patient Have a Medical Advance Directive? Yes  Type of Paramedic of [place];Living will  Does patient want to make changes to medical advance directive? No - Patient declined  Copy of [place] in Chart? Yes - validated most recent copy scanned in chart (See row information)  Would patient like information on creating a medical advance directive? No - Patient declined  Pre-existing out of facility DNR order (yellow form or pink MOST form) -     Chief Complaint  Patient presents with  . Medical Management of Chronic Issues    HPI:  Pt is a 85 y.o. female seen today for medical management of chronic diseases.  Resides in the skilled care setting.   She reports she had two lesions removed from her chest and she is waiting on the pathology report. Mild discomfort noted but no drainage or fever.   Cor pulmonale and afib: HR in the 70's mostly in matrix BP range 35-701 systolic INR therapeutic at 2.0 on 3/4, checked monthly Reports increased dyspnea on exertion, no dyspnea at rest or chest pain Weight has increased by 10 lbs in the past month  COPD: dry cough chronically with no sputum production.  Dependent on 2 liters of oxygen  [place].  Continues to walk daily with the staff.   right shoulder pain: slight improvement in pain and rom with OT ultrasound, tylenol, and voltaren gel. Declined shoulder injection    Past Medical History:  Diagnosis Date  . Breast cancer ([place])    left   . Chronic anticoagulation   . Chronic atrial fibrillation   . Hypertension   . Pelvic fracture ([place]) 05/2016   Past Surgical History:  Procedure Laterality Date  . ABDOMINAL HYSTERECTOMY    . BREAST LUMPECTOMY      Allergies  Allergen Reactions  . Aspirin     Upset stomach  . Codeine Nausea Only    Nausea     Outpatient Encounter Medications as of 05/26/2018  Medication Sig  . acetaminophen (TYLENOL) 500 MG tablet Take 1,000 mg by mouth at bedtime.   Marland Kitchen allopurinol (ZYLOPRIM) 100 MG tablet Take 100 mg by mouth at bedtime.   . ALPRAZolam (XANAX) 0.5 MG tablet Take 1 tablet (0.5 mg total) by mouth at bedtime.  . benzonatate (TESSALON) 100 MG capsule Take 1 capsule (100 mg total) by mouth 3 (three) times daily as needed for cough.  . Calcium Carbonate-Vitamin D3 (CALCIUM 600-D) 600-400 MG-UNIT TABS Take 1 tablet by mouth daily.  Marland Kitchen dextromethorphan (DELSYM) 30 MG/5ML liquid Take 30 mg by mouth every 12 (twelve) hours as needed for cough.  . diclofenac sodium (VOLTAREN) 1 % GEL Apply 2 g topically 4 (four) times daily. Right shoulder  . docusate sodium (COLACE) 100 MG capsule Take 100 mg by mouth at bedtime.  . feeding supplement (BOOST HIGH PROTEIN) LIQD Take  1 Container by mouth 2 (two) times daily between meals.  Marland Kitchen guaiFENesin (MUCINEX) 600 MG 12 hr tablet Take 600 mg by mouth 2 (two) times daily.  Marland Kitchen ipratropium (ATROVENT) 0.02 % nebulizer solution Take 2.5 mLs (0.5 mg total) by nebulization 3 (three) times daily.  Marland Kitchen levalbuterol (XOPENEX) 1.25 MG/0.5ML nebulizer solution Take 1.25 mg by nebulization 3 (three) times daily.  . metoprolol succinate (TOPROL-XL) 50 MG 24 hr tablet Take 1 tablet (50 mg total) by mouth daily.  .  potassium chloride SA (K-DUR,KLOR-CON) 20 MEQ tablet Take 20 mEq by mouth daily.  Marland Kitchen torsemide (DEMADEX) 20 MG tablet Take 40 mg by mouth daily.  Marland Kitchen warfarin (COUMADIN) 2 MG tablet Take 2-4 mg by mouth daily. Pt takes one tablet daily (2 mg) daily except for on Thursday pt takes two tablets (4 mg)   No facility-administered encounter medications on file as of 05/26/2018.     Review of Systems  Constitutional: Negative for activity change, appetite change, chills, diaphoresis, fatigue, fever and unexpected weight change.  HENT: Negative for congestion, sore throat and trouble swallowing.   Respiratory: Positive for cough and shortness of breath. Negative for wheezing.   Cardiovascular: Positive for leg swelling. Negative for chest pain and palpitations.  Gastrointestinal: Negative for abdominal distention, abdominal pain, constipation and diarrhea.  Genitourinary: Negative for difficulty urinating and dysuria.  Musculoskeletal: Positive for arthralgias and gait problem. Negative for back pain, joint swelling and myalgias.  Skin: Positive for wound.  Neurological: Negative for dizziness, tremors, seizures, syncope, facial asymmetry, speech difficulty, weakness, light-headedness, numbness and headaches.  Psychiatric/Behavioral: Negative for agitation, behavioral problems and confusion.       Short term memory loss    Immunization History  Administered Date(s) Administered  . Influenza Split 12/07/2014  . Influenza-Unspecified 11/05/2013  . Pneumococcal Polysaccharide-23 03/02/2012  . Tdap 03/02/2012  . Zoster 03/03/2011   Pertinent  Health Maintenance Due  Topic Date Due  . PNA vac Low Risk Adult (2 of 2 - PCV13) 03/02/2013  . INFLUENZA VACCINE  09/30/2017  . DEXA SCAN  Completed   No flowsheet data found. Functional Status Survey:    Vitals:   05/26/18 1243  Weight: 136 lb 12.8 oz (62.1 kg)   Body mass index is 23.48 kg/m. Physical Exam Vitals signs and nursing note reviewed.   Constitutional:      General: She is not in acute distress.    Appearance: She is not diaphoretic.  HENT:     Head: Normocephalic and atraumatic.  Neck:     Vascular: No JVD.  Cardiovascular:     Rate and Rhythm: Normal rate. Rhythm irregular.     Heart sounds: No murmur.  Pulmonary:     Effort: Pulmonary effort is normal. No respiratory distress.     Breath sounds: Wheezing present.     Comments: Decreased throughout Musculoskeletal:        General: No swelling or tenderness.     Comments: Right shoulder with reduced AROM but improved abduction noted. No swelling or tenderness.   Skin:    General: Skin is warm and dry.     Comments: Chest with two dressing s/p lesion removal. CDI.  Neurological:     Mental Status: She is alert and oriented to person, place, and time.     Labs reviewed: Recent Labs    11/18/17 1832 11/19/17 0231 03/14/18 1407 03/15/18 0820 03/21/18 0300 04/11/18 04/18/18  NA  --  139 139 135 139 141 144  K  --  3.2* 4.5 4.2 4.3 4.4 4.1  CL  --  96* 96* 97*  --   --   --   CO2  --  _0 --   --   --   GLUCOSE  --  151* 136* 141*  --   --   --   BUN  --  22 45* 51* 37* 45* 44*  CREATININE  --  1.12* 1.23* 1.05* 0.8 0.8 1.0  CALCIUM  --  9.1 9.6 8.9  --   --   --   MG 1.8  --   --   --   --   --   --   PHOS 3.2  --   --   --   --   --   --    Recent Labs    11/18/17 1259 03/15/18 0820  AST 26 31  ALT 16 20  ALKPHOS 108 81  BILITOT 4.9* 1.2  PROT 7.0 6.3*  ALBUMIN 3.7 3.4*   Recent Labs    11/19/17 0231 03/14/18 1407 03/15/18 0820 03/21/18 0300  WBC 15.5* 10.9* 10.5 10.9  NEUTROABS  --  7.9*  --   --   HGB 13.2 15.3* 13.5 14.5  HCT 38.9 47.4* 40.6 43  MCV 107.2* 111.0* 109.1*  --   PLT 150 164 146* 205   Lab Results  Component Value Date   TSH 1.602 11/18/2017   No results found for: HGBA1C No results found for: CHOL, HDL, LDLCALC, LDLDIRECT, TRIG, CHOLHDL  Significant Diagnostic Results in last 30 days:  No results  found.  Assessment/Plan 1. Cor pulmonale, chronic (HCC) With 10 lb weight gain over the past month and increased DOE Increase torsemide to 40 mg bid x 2 days then return to 40 mg qd Continue Kdur 20 meq qd Monitor weight daily, reduce boost to 1 can daily  2. Permanent atrial fibrillation Rate controlled  Continue coumadin for CVA risk reduction Continue metoprolol for rate control  3. Chronic respiratory failure with hypoxia (HCC) Continue oxygen at 2liters [place]  4. Pulmonary emphysema, unspecified emphysema type ([place]) Continue atrovent and xopenex tid  5. CKD (chronic kidney disease), stage III (HCC) Continue to periodically monitor BMP and avoid nephrotoxic agents  6. Acute pain of right shoulder Slight improvement noted. May continue ultrasound with OT. Continue tylenol and voltaren gel.     Family/ staff Communication: resident/nurse  Labs/tests ordered:  NA

## 2018-05-27 DIAGNOSIS — J441 Chronic obstructive pulmonary disease with (acute) exacerbation: Secondary | ICD-10-CM | POA: Diagnosis not present

## 2018-05-27 DIAGNOSIS — R278 Other lack of coordination: Secondary | ICD-10-CM | POA: Diagnosis not present

## 2018-05-27 DIAGNOSIS — I4819 Other persistent atrial fibrillation: Secondary | ICD-10-CM | POA: Diagnosis not present

## 2018-05-27 DIAGNOSIS — M6389 Disorders of muscle in diseases classified elsewhere, multiple sites: Secondary | ICD-10-CM | POA: Diagnosis not present

## 2018-05-27 DIAGNOSIS — R0602 Shortness of breath: Secondary | ICD-10-CM | POA: Diagnosis not present

## 2018-05-27 DIAGNOSIS — R2689 Other abnormalities of gait and mobility: Secondary | ICD-10-CM | POA: Diagnosis not present

## 2018-05-31 ENCOUNTER — Non-Acute Institutional Stay (SKILLED_NURSING_FACILITY): Payer: Medicare Other | Admitting: Internal Medicine

## 2018-05-31 ENCOUNTER — Encounter: Payer: Self-pay | Admitting: Internal Medicine

## 2018-05-31 DIAGNOSIS — I2781 Cor pulmonale (chronic): Secondary | ICD-10-CM

## 2018-05-31 DIAGNOSIS — J9611 Chronic respiratory failure with hypoxia: Secondary | ICD-10-CM | POA: Diagnosis not present

## 2018-05-31 DIAGNOSIS — J439 Emphysema, unspecified: Secondary | ICD-10-CM | POA: Diagnosis not present

## 2018-05-31 DIAGNOSIS — J441 Chronic obstructive pulmonary disease with (acute) exacerbation: Secondary | ICD-10-CM | POA: Diagnosis not present

## 2018-05-31 DIAGNOSIS — M6389 Disorders of muscle in diseases classified elsewhere, multiple sites: Secondary | ICD-10-CM | POA: Diagnosis not present

## 2018-05-31 DIAGNOSIS — R2689 Other abnormalities of gait and mobility: Secondary | ICD-10-CM | POA: Diagnosis not present

## 2018-05-31 DIAGNOSIS — K5901 Slow transit constipation: Secondary | ICD-10-CM

## 2018-05-31 DIAGNOSIS — R278 Other lack of coordination: Secondary | ICD-10-CM | POA: Diagnosis not present

## 2018-05-31 DIAGNOSIS — I4821 Permanent atrial fibrillation: Secondary | ICD-10-CM | POA: Diagnosis not present

## 2018-05-31 DIAGNOSIS — I4819 Other persistent atrial fibrillation: Secondary | ICD-10-CM | POA: Diagnosis not present

## 2018-05-31 DIAGNOSIS — R0602 Shortness of breath: Secondary | ICD-10-CM | POA: Diagnosis not present

## 2018-05-31 MED ORDER — LUBIPROSTONE 24 MCG PO CAPS: 24.0000 ug | ORAL_CAPSULE | Freq: Two times a day (BID) | ORAL | 3 refills | Status: DC

## 2018-05-31 NOTE — Progress Notes (Signed)
Patient ID: [name], female   DOB: 03-28-1924, 85 y.o.   MRN: [id number]  Location:  [facility] Room Number: 127 Place of Service:  SNF ([phone]) Provider:   [name], DO  Patient Care Team: [name], DO as PCP - General (Geriatric Medicine) Sanda Klein, MD as PCP - Cardiology (Cardiology)  Extended Emergency Contact Information Primary Emergency Contact: [name] of [place] Phone: [phone] Work Phone: [phone] Mobile Phone: [phone] Relation: Daughter Secondary Emergency Contact: Lesleigh Noe Work Phone: [phone] Mobile Phone: [phone] Relation: Son  Code Status:  DNR Goals of care: Advanced Directive information Advanced Directives 03/29/2018  Does Patient Have a Medical Advance Directive? Yes  Type of Paramedic of [place];Living will  Does patient want to make changes to medical advance directive? No - Patient declined  Copy of [place] in Chart? Yes - validated most recent copy scanned in chart (See row information)  Would patient like information on creating a medical advance directive? No - Patient declined  Pre-existing out of facility DNR order (yellow form or pink MOST form) -     Chief Complaint  Patient presents with  . Acute Visit    weight gain and increased sob    HPI:  [name] is a 85 y.o. female  With h/o cor pulmonale, afib rate controlled and anticoagulated with coumadin (last INR 2 on 3/4), advanced copd on oxygen seen today for acute visit for weight gain and increased shortness of breath.  [name] has told different staff different things about how she's feeling.  She reported to me that she's  more short of breath when she goes to the bathroom w/o her oxygen, but does not otherwise notice any change.  She has less edema.  She feels like she's eating better.  Of note, she'd received extra torsemide for weight gain on 3/26  and 3/27, and yesterday her weight was still trending up.  She is now eating regular meals and continues to take boost shakes bid.  For a short time when she moved to snf, she was on a cardiac diet, but was not eating that well so it was liberalized and she's gained weight since, but she thinks it's food not fluid.  She was comfortable sitting in her recliner wearing her oxygen.  She'd just been working with OT.  Nursing has asked the dietitian to check if [name] still needs boost bid with her much improved intake. [name]'s baseline weight prior to getting sick and requiring rehab was 137 lbs.  She is currently on torsemide 89m po daily and potassium 261m po daily.  Her biggest complaint for me was that she does not have regular bms and when she does go, they are "huge".  They are not hard.  She tries to do well with eating oatmeal, granola, prunes, etc, but has still occasionally needed milk of mag or a suppository and she mentions that miralax and stool softeners do not help her.  She know increasing her mobility would help.    Past Medical History:  Diagnosis Date  . Breast cancer ([facility]   left   . Chronic anticoagulation   . Chronic atrial fibrillation   . Hypertension   . [place]vic [place] ([facility]   Past Surgical History:  Procedure Laterality Date  . ABDOMINAL HYSTERECTOMY    . BREAST LUMPECTOMY      Allergies  Allergen Reactions  . Aspirin  Upset stomach  . Codeine Nausea Only    Nausea     Outpatient Encounter Medications as of 05/31/2018  Medication Sig  . acetaminophen (TYLENOL) 500 MG tablet Take 1,000 mg by mouth 3 (three) times daily.   Marland Kitchen allopurinol (ZYLOPRIM) 100 MG tablet Take 100 mg by mouth at bedtime.   . ALPRAZolam (XANAX) 0.5 MG tablet Take 1 tablet (0.5 mg total) by mouth at bedtime.  Marland Kitchen antiseptic oral rinse (BIOTENE) LIQD 15 mLs by Mouth Rinse route as needed for dry mouth.  . Calcium Carbonate-Vitamin D3 (CALCIUM 600-D) 600-400 MG-UNIT TABS Take 1 tablet by  mouth daily.  Marland Kitchen dextromethorphan (DELSYM) 30 MG/5ML liquid Take 30 mg by mouth every 12 (twelve) hours as needed for cough.  . diclofenac sodium (VOLTAREN) 1 % GEL Apply 2 g topically 4 (four) times daily. Right shoulder  . docusate sodium (COLACE) 100 MG capsule Take 100 mg by mouth at bedtime.  . feeding supplement (BOOST HIGH PROTEIN) LIQD Take 1 Container by mouth 2 (two) times daily between meals.  Marland Kitchen guaiFENesin (MUCINEX) 600 MG 12 hr tablet Take 600 mg by mouth 2 (two) times daily.  Marland Kitchen ipratropium (ATROVENT) 0.02 % nebulizer solution Take 2.5 mLs (0.5 mg total) by nebulization 3 (three) times daily.  Marland Kitchen levalbuterol (XOPENEX) 1.25 MG/0.5ML nebulizer solution Take 1.25 mg by nebulization 3 (three) times daily.  . metoprolol succinate (TOPROL-XL) 50 MG 24 hr tablet Take 1 tablet (50 mg total) by mouth daily.  . potassium chloride SA (K-DUR,KLOR-CON) 20 MEQ tablet Take 20 mEq by mouth daily.  Marland Kitchen torsemide (DEMADEX) 20 MG tablet Take 40 mg by mouth daily.  Marland Kitchen warfarin (COUMADIN) 2 MG tablet Take 2-4 mg by mouth daily. [name] takes one tablet daily (2 mg) daily except for on Thursday [name] takes two tablets (4 mg)  . [DISCONTINUED] benzonatate (TESSALON) 100 MG capsule Take 1 capsule (100 mg total) by mouth 3 (three) times daily as needed for cough.   No facility-administered encounter medications on file as of 05/31/2018.     Review of Systems  Constitutional: Positive for appetite change and unexpected weight change. Negative for activity change, chills, fatigue and fever.       Weight gain with improved appetite  HENT: Negative for congestion.   Eyes: Negative for visual disturbance.  Respiratory: Positive for cough, shortness of breath and wheezing. Negative for chest tightness.        All stable  Cardiovascular: Negative for chest pain, palpitations and leg swelling.  Gastrointestinal: Positive for constipation. Negative for abdominal pain, diarrhea, nausea and vomiting.  Genitourinary: Negative  for dysuria.  Musculoskeletal: Positive for gait problem.  Neurological: Positive for weakness. Negative for dizziness.  Hematological: Bruises/bleeds easily.  Psychiatric/Behavioral: Negative for agitation, behavioral problems and sleep disturbance. The patient is not nervous/anxious.        Has gotten anxious at night, but under control    Immunization History  Administered Date(s) Administered  . Influenza Split 12/07/2014  . Influenza-Unspecified 11/05/2013  . Pneumococcal Polysaccharide-23 03/02/2012  . Tdap 03/02/2012  . Zoster 03/03/2011   Pertinent  Health Maintenance Due  Topic Date Due  . PNA vac Low Risk Adult (2 of 2 - PCV13) 03/02/2013  . INFLUENZA VACCINE  09/30/2017  . DEXA SCAN  Completed   No flowsheet data found. Functional Status Survey:    Vitals:   05/31/18 1036  BP: 108/67  Pulse: 92  Resp: (!) 22  Temp: (!) 97.4 F (36.3 C)  TempSrc: Oral  SpO2: 96%  Weight: 138 lb (62.6 kg)  Height: _0  (1.626 m)   Body mass index is 23.69 kg/m. Physical Exam Vitals signs reviewed.  Constitutional:      General: She is not in acute distress.    Appearance: Normal appearance. She is not ill-appearing or toxic-appearing.  HENT:     Head: Normocephalic and atraumatic.  Neck:     Comments: No jvd Cardiovascular:     Comments: irreg irreg Pulmonary:     Effort: Pulmonary effort is normal.     Breath sounds: Rales present.     Comments: Few rales at bases, but no significant wheezing throughout as she's sometimes had; resting comfortably wearing her chronic O2 Abdominal:     General: Bowel sounds are normal. There is no distension.     Palpations: Abdomen is soft. There is no mass.     Tenderness: There is no abdominal tenderness.  Musculoskeletal:        General: No swelling.     Comments: Bilateral LEs wrapped with ace bandages  Skin:    General: Skin is warm and dry.  Neurological:     General: No focal deficit present.     Mental Status: She is  alert. Mental status is at baseline.  Psychiatric:        Mood and Affect: Mood normal.     Labs reviewed: Recent Labs    11/18/17 1832 11/19/17 0231 03/14/18 1407 03/15/18 0820 03/21/18 0300 04/11/18 04/18/18  NA  --  139 139 135 139 141 144  K  --  3.2* 4.5 4.2 4.3 4.4 4.1  CL  --  96* 96* 97*  --   --   --   CO2  --  _1 --   --   --   GLUCOSE  --  151* 136* 141*  --   --   --   BUN  --  22 45* 51* 37* 45* 44*  CREATININE  --  1.12* 1.23* 1.05* 0.8 0.8 1.0  CALCIUM  --  9.1 9.6 8.9  --   --   --   MG 1.8  --   --   --   --   --   --   PHOS 3.2  --   --   --   --   --   --    Recent Labs    11/18/17 1259 03/15/18 0820  AST 26 31  ALT 16 20  ALKPHOS 108 81  BILITOT 4.9* 1.2  PROT 7.0 6.3*  ALBUMIN 3.7 3.4*   Recent Labs    11/19/17 0231 03/14/18 1407 03/15/18 0820 03/21/18 0300  WBC 15.5* 10.9* 10.5 10.9  NEUTROABS  --  7.9*  --   --   HGB 13.2 15.3* 13.5 14.5  HCT 38.9 47.4* 40.6 43  MCV 107.2* 111.0* 109.1*  --   PLT 150 164 146* 205   Lab Results  Component Value Date   TSH 1.602 11/18/2017   No results found for: HGBA1C No results found for: CHOL, HDL, LDLCALC, LDLDIRECT, TRIG, CHOLHDL  Significant Diagnostic Results in last 30 days:  No results found.  Assessment/Plan 1. Cor pulmonale, chronic (HCC) -could be progressing to explain her dyspnea going to the bathroom  2. Permanent atrial fibrillation -cont toprol xl for rate control, coumadin for anticoagulation with goal INR 2-3  3. Chronic respiratory failure with hypoxia (HCC) -cont chronic O2 via [place], appears stable at present  4.  Pulmonary emphysema, unspecified emphysema type ([place]) -seems stable at present with her chronic O2 and xopenex nebs tid, mucinex bid  5. Slow transit constipation -will add amitiza to her regimen due to reports that typical otc regimens have been unsuccessful for her -if she gets loose bms, will need to reduce to daily from bid or even go to every other  day, but this is the recommended dose for chronic idiopathic constipation  Family/ staff Communication: discussed with snf nurse  Labs/tests ordered:  No new today  [place] L. Brileigh Sevcik, D.O. [facility] [address], [place] [address] Cell Phone (Mon-Fri 8am-5pm):  [phone] On Call:  [phone] & follow prompts after 5pm & weekends Office Phone:  [phone] Office Fax:  [phone]

## 2018-06-01 DIAGNOSIS — M6389 Disorders of muscle in diseases classified elsewhere, multiple sites: Secondary | ICD-10-CM | POA: Diagnosis not present

## 2018-06-01 DIAGNOSIS — I4891 Unspecified atrial fibrillation: Secondary | ICD-10-CM | POA: Diagnosis not present

## 2018-06-01 DIAGNOSIS — J9601 Acute respiratory failure with hypoxia: Secondary | ICD-10-CM | POA: Diagnosis not present

## 2018-06-01 DIAGNOSIS — R0602 Shortness of breath: Secondary | ICD-10-CM | POA: Diagnosis not present

## 2018-06-01 DIAGNOSIS — R278 Other lack of coordination: Secondary | ICD-10-CM | POA: Diagnosis not present

## 2018-06-01 DIAGNOSIS — I4819 Other persistent atrial fibrillation: Secondary | ICD-10-CM | POA: Diagnosis not present

## 2018-06-01 DIAGNOSIS — J441 Chronic obstructive pulmonary disease with (acute) exacerbation: Secondary | ICD-10-CM | POA: Diagnosis not present

## 2018-06-03 DIAGNOSIS — I4819 Other persistent atrial fibrillation: Secondary | ICD-10-CM | POA: Diagnosis not present

## 2018-06-03 DIAGNOSIS — R0602 Shortness of breath: Secondary | ICD-10-CM | POA: Diagnosis not present

## 2018-06-03 DIAGNOSIS — J9601 Acute respiratory failure with hypoxia: Secondary | ICD-10-CM | POA: Diagnosis not present

## 2018-06-03 DIAGNOSIS — M6389 Disorders of muscle in diseases classified elsewhere, multiple sites: Secondary | ICD-10-CM | POA: Diagnosis not present

## 2018-06-03 DIAGNOSIS — J441 Chronic obstructive pulmonary disease with (acute) exacerbation: Secondary | ICD-10-CM | POA: Diagnosis not present

## 2018-06-03 DIAGNOSIS — R278 Other lack of coordination: Secondary | ICD-10-CM | POA: Diagnosis not present

## 2018-06-06 DIAGNOSIS — J441 Chronic obstructive pulmonary disease with (acute) exacerbation: Secondary | ICD-10-CM | POA: Diagnosis not present

## 2018-06-06 DIAGNOSIS — R0602 Shortness of breath: Secondary | ICD-10-CM | POA: Diagnosis not present

## 2018-06-06 DIAGNOSIS — J9601 Acute respiratory failure with hypoxia: Secondary | ICD-10-CM | POA: Diagnosis not present

## 2018-06-06 DIAGNOSIS — M6389 Disorders of muscle in diseases classified elsewhere, multiple sites: Secondary | ICD-10-CM | POA: Diagnosis not present

## 2018-06-06 DIAGNOSIS — I4819 Other persistent atrial fibrillation: Secondary | ICD-10-CM | POA: Diagnosis not present

## 2018-06-06 DIAGNOSIS — R278 Other lack of coordination: Secondary | ICD-10-CM | POA: Diagnosis not present

## 2018-06-07 DIAGNOSIS — R278 Other lack of coordination: Secondary | ICD-10-CM | POA: Diagnosis not present

## 2018-06-07 DIAGNOSIS — J441 Chronic obstructive pulmonary disease with (acute) exacerbation: Secondary | ICD-10-CM | POA: Diagnosis not present

## 2018-06-07 DIAGNOSIS — I4819 Other persistent atrial fibrillation: Secondary | ICD-10-CM | POA: Diagnosis not present

## 2018-06-07 DIAGNOSIS — R0602 Shortness of breath: Secondary | ICD-10-CM | POA: Diagnosis not present

## 2018-06-07 DIAGNOSIS — M6389 Disorders of muscle in diseases classified elsewhere, multiple sites: Secondary | ICD-10-CM | POA: Diagnosis not present

## 2018-06-07 DIAGNOSIS — J9601 Acute respiratory failure with hypoxia: Secondary | ICD-10-CM | POA: Diagnosis not present

## 2018-06-09 DIAGNOSIS — J441 Chronic obstructive pulmonary disease with (acute) exacerbation: Secondary | ICD-10-CM | POA: Diagnosis not present

## 2018-06-09 DIAGNOSIS — J9601 Acute respiratory failure with hypoxia: Secondary | ICD-10-CM | POA: Diagnosis not present

## 2018-06-09 DIAGNOSIS — R278 Other lack of coordination: Secondary | ICD-10-CM | POA: Diagnosis not present

## 2018-06-09 DIAGNOSIS — M6389 Disorders of muscle in diseases classified elsewhere, multiple sites: Secondary | ICD-10-CM | POA: Diagnosis not present

## 2018-06-09 DIAGNOSIS — R0602 Shortness of breath: Secondary | ICD-10-CM | POA: Diagnosis not present

## 2018-06-09 DIAGNOSIS — I4819 Other persistent atrial fibrillation: Secondary | ICD-10-CM | POA: Diagnosis not present

## 2018-06-14 DIAGNOSIS — I4819 Other persistent atrial fibrillation: Secondary | ICD-10-CM | POA: Diagnosis not present

## 2018-06-14 DIAGNOSIS — J441 Chronic obstructive pulmonary disease with (acute) exacerbation: Secondary | ICD-10-CM | POA: Diagnosis not present

## 2018-06-14 DIAGNOSIS — R0602 Shortness of breath: Secondary | ICD-10-CM | POA: Diagnosis not present

## 2018-06-14 DIAGNOSIS — R278 Other lack of coordination: Secondary | ICD-10-CM | POA: Diagnosis not present

## 2018-06-14 DIAGNOSIS — M6389 Disorders of muscle in diseases classified elsewhere, multiple sites: Secondary | ICD-10-CM | POA: Diagnosis not present

## 2018-06-14 DIAGNOSIS — J9601 Acute respiratory failure with hypoxia: Secondary | ICD-10-CM | POA: Diagnosis not present

## 2018-06-15 DIAGNOSIS — I482 Chronic atrial fibrillation, unspecified: Secondary | ICD-10-CM | POA: Diagnosis not present

## 2018-06-23 ENCOUNTER — Other Ambulatory Visit: Payer: Self-pay

## 2018-06-23 ENCOUNTER — Encounter: Payer: Self-pay | Admitting: Adult Health

## 2018-06-23 ENCOUNTER — Non-Acute Institutional Stay (SKILLED_NURSING_FACILITY): Payer: Medicare Other | Admitting: Adult Health

## 2018-06-23 DIAGNOSIS — K5901 Slow transit constipation: Secondary | ICD-10-CM

## 2018-06-23 DIAGNOSIS — I4821 Permanent atrial fibrillation: Secondary | ICD-10-CM

## 2018-06-23 DIAGNOSIS — J439 Emphysema, unspecified: Secondary | ICD-10-CM | POA: Diagnosis not present

## 2018-06-23 DIAGNOSIS — I2781 Cor pulmonale (chronic): Secondary | ICD-10-CM | POA: Diagnosis not present

## 2018-06-23 DIAGNOSIS — G8929 Other chronic pain: Secondary | ICD-10-CM

## 2018-06-23 DIAGNOSIS — M25511 Pain in right shoulder: Secondary | ICD-10-CM | POA: Diagnosis not present

## 2018-06-23 DIAGNOSIS — J9611 Chronic respiratory failure with hypoxia: Secondary | ICD-10-CM

## 2018-06-23 DIAGNOSIS — I2721 Secondary pulmonary arterial hypertension: Secondary | ICD-10-CM | POA: Diagnosis not present

## 2018-06-23 NOTE — Progress Notes (Signed)
Location:  [facility] of Service:  SNF (31) Provider:   Cindi Carbon, ANP [place] [phone]  [name], DO  Patient Care Team: [name], DO as PCP - General (Geriatric Medicine) Sanda Klein, MD as PCP - Cardiology (Cardiology)  Extended Emergency Contact Information Primary Emergency Contact: [name] of [place] Phone: [phone] Work Phone: [phone] Mobile Phone: [phone] Relation: Daughter Secondary Emergency Contact: Reene, [place] Work Phone: [phone] Mobile Phone: [phone] Relation: Son  Code Status: DNR Goals of care: Advanced Directive information Advanced Directives 03/29/2018  Does Patient Have a Medical Advance Directive? Yes  Type of Paramedic of [place];Living will  Does patient want to make changes to medical advance directive? No - Patient declined  Copy of [place] in Chart? Yes - validated most recent copy scanned in chart (See row information)  Would patient like information on creating a medical advance directive? No - Patient declined  Pre-existing out of facility DNR order (yellow form or pink MOST form) -     Chief Complaint  Patient presents with  . Medical Management of Chronic Issues    HPI:  Pt is a 85 y.o. female seen today for medical management of chronic diseases.    COPD: Former remote smoker. Has chronic DOE that is unchanged and chronic dry cough  No fever or decreased 02 sats. Wears 2 liters of oxygen at all times. Feels that the portable machine does not do a "good job" and likes the concentrator in her room better  Afib: bp controlled, no increased edema, changed in DOE, or palpitations INR 1.9   Cor pulmonale: weight has trended up over the past year but no change in the past month. She is taking boost and also reports that her weight was in the 130's in the past and things this is  a good weight for her. Wt Readings from Last 3 Encounters:  06/23/18 138 lb 6.4 oz (62.8 kg)  05/31/18 138 lb (62.6 kg)  05/26/18 136 lb 12.8 oz (62.1 kg)   Right shoulder pain: rotator cuff injury showing mild improvement in pain and mobility. She is doing exercises recommended by therapy  Constipation: bowels are moving well with miralax and colace. Stopped amitiza because it was "too strong"  Functional status: continent, ambulatory with a walker  Past Medical History:  Diagnosis Date  . Breast cancer ([place] [facility])    left   . Chronic anticoagulation   . Chronic atrial fibrillation   . Hypertension   . Pelvic fracture ([place]) 05/2016   Past Surgical History:  Procedure Laterality Date  . ABDOMINAL HYSTERECTOMY    . BREAST LUMPECTOMY      Allergies  Allergen Reactions  . Aspirin     Upset stomach  . Codeine Nausea Only    Nausea     Outpatient Encounter Medications as of 06/23/2018  Medication Sig  . acetaminophen (TYLENOL) 500 MG tablet Take 1,000 mg by mouth 3 (three) times daily.   Marland Kitchen allopurinol (ZYLOPRIM) 100 MG tablet Take 100 mg by mouth at bedtime.   . ALPRAZolam (XANAX) 0.5 MG tablet Take 1 tablet (0.5 mg total) by mouth at bedtime.  Marland Kitchen antiseptic oral rinse (BIOTENE) LIQD 15 mLs by Mouth Rinse route as needed for dry mouth.  . Calcium Carbonate-Vitamin D3 (CALCIUM 600-D) 600-400 MG-UNIT TABS Take 1 tablet by mouth daily.  Marland Kitchen dextromethorphan (DELSYM) 30 MG/5ML liquid Take 30 mg by mouth  every 12 (twelve) hours as needed for cough.  . diclofenac sodium (VOLTAREN) 1 % GEL Apply 2 g topically 4 (four) times daily. Right shoulder  . docusate sodium (COLACE) 100 MG capsule Take 100 mg by mouth at bedtime.  . feeding supplement (BOOST HIGH PROTEIN) LIQD Take 1 Container by mouth 2 (two) times daily between meals.  Marland Kitchen guaiFENesin (MUCINEX) 600 MG 12 hr tablet Take 600 mg by mouth 2 (two) times daily.  Marland Kitchen ipratropium (ATROVENT) 0.02 % nebulizer solution Take 2.5 mLs (0.5 mg total)  by nebulization 3 (three) times daily.  Marland Kitchen levalbuterol (XOPENEX) 1.25 MG/0.5ML nebulizer solution Take 1.25 mg by nebulization 3 (three) times daily.  Marland Kitchen lubiprostone (AMITIZA) 24 MCG capsule Take 1 capsule (24 mcg total) by mouth 2 (two) times daily with a meal.  . metoprolol succinate (TOPROL-XL) 50 MG 24 hr tablet Take 1 tablet (50 mg total) by mouth daily.  . potassium chloride SA (K-DUR,KLOR-CON) 20 MEQ tablet Take 20 mEq by mouth daily.  Marland Kitchen torsemide (DEMADEX) 20 MG tablet Take 40 mg by mouth daily.  Marland Kitchen warfarin (COUMADIN) 2 MG tablet Take 2-4 mg by mouth daily. Pt takes one tablet daily (2 mg) daily except for on Thursday pt takes two tablets (4 mg)   No facility-administered encounter medications on file as of 06/23/2018.     Review of Systems  Constitutional: Negative for activity change, appetite change, chills, diaphoresis, fatigue, fever and unexpected weight change.  HENT: Negative for congestion.   Respiratory: Positive for cough (dry chronic ) and shortness of breath (with exertion chronic). Negative for wheezing.   Cardiovascular: Negative for chest pain, palpitations and leg swelling.  Gastrointestinal: Negative for abdominal distention, abdominal pain, constipation and diarrhea.  Genitourinary: Negative for difficulty urinating and dysuria.  Musculoskeletal: Positive for arthralgias (right shoulder pain) and gait problem. Negative for back pain, joint swelling and myalgias.  Skin:       Skin lesion to chest and LLE  Neurological: Negative for dizziness, tremors, seizures, syncope, facial asymmetry, speech difficulty, weakness, light-headedness, numbness and headaches.  Psychiatric/Behavioral: Negative for agitation, behavioral problems and confusion.    Immunization History  Administered Date(s) Administered  . Influenza Split 12/07/2014  . Influenza-Unspecified 11/05/2013  . Pneumococcal Polysaccharide-23 03/02/2012  . Tdap 03/02/2012  . Zoster 03/03/2011   Pertinent   Health Maintenance Due  Topic Date Due  . PNA vac Low Risk Adult (2 of 2 - PCV13) 03/02/2013  . INFLUENZA VACCINE  10/01/2018  . DEXA SCAN  Completed   No flowsheet data found. Functional Status Survey:    Vitals:   06/23/18 1048  BP: 120/77  Pulse: 78  Resp: 16  Temp: (!) 97.5 F (36.4 C)  SpO2: 93%  Weight: 138 lb 6.4 oz (62.8 kg)   Body mass index is 23.76 kg/m. Physical Exam Vitals signs and nursing note reviewed.  Constitutional:      General: She is not in acute distress.    Appearance: Normal appearance. She is not diaphoretic.  HENT:     Head: Normocephalic and atraumatic.  Neck:     Vascular: No JVD.  Cardiovascular:     Rate and Rhythm: Normal rate. Rhythm irregular.     Heart sounds: No murmur.  Pulmonary:     Effort: Pulmonary effort is normal. No respiratory distress.     Breath sounds: Wheezing present.     Comments: Decreased throughout Abdominal:     General: Abdomen is flat. Bowel sounds are normal.  Palpations: Abdomen is soft.  Musculoskeletal:        General: No swelling, tenderness, deformity or signs of injury.     Right lower leg: No edema.     Left lower leg: No edema.     Comments: Decreased rom to right shoulder with positive empty can test  Skin:    General: Skin is warm and dry.  Neurological:     Mental Status: She is alert and oriented to person, place, and time. Mental status is at baseline.  Psychiatric:        Mood and Affect: Mood normal.     Labs reviewed: Recent Labs    11/18/17 1832 11/19/17 0231 03/14/18 1407 03/15/18 0820 03/21/18 0300 04/11/18 04/18/18  NA  --  139 139 135 139 141 144  K  --  3.2* 4.5 4.2 4.3 4.4 4.1  CL  --  96* 96* 97*  --   --   --   CO2  --  _0 --   --   --   GLUCOSE  --  151* 136* 141*  --   --   --   BUN  --  22 45* 51* 37* 45* 44*  CREATININE  --  1.12* 1.23* 1.05* 0.8 0.8 1.0  CALCIUM  --  9.1 9.6 8.9  --   --   --   MG 1.8  --   --   --   --   --   --   PHOS 3.2  --   --    --   --   --   --    Recent Labs    11/18/17 1259 03/15/18 0820  AST 26 31  ALT 16 20  ALKPHOS 108 81  BILITOT 4.9* 1.2  PROT 7.0 6.3*  ALBUMIN 3.7 3.4*   Recent Labs    11/19/17 0231 03/14/18 1407 03/15/18 0820 03/21/18 0300  WBC 15.5* 10.9* 10.5 10.9  NEUTROABS  --  7.9*  --   --   HGB 13.2 15.3* 13.5 14.5  HCT 38.9 47.4* 40.6 43  MCV 107.2* 111.0* 109.1*  --   PLT 150 164 146* 205   Lab Results  Component Value Date   TSH 1.602 11/18/2017   No results found for: HGBA1C No results found for: CHOL, HDL, LDLCALC, LDLDIRECT, TRIG, CHOLHDL  Significant Diagnostic Results in last 30 days:  No results found.  Assessment/Plan 1. Pulmonary emphysema, unspecified emphysema type ([place]) FEV1 45% Controlled. Continue Atrovent and Xopenex TID.   2. Chronic respiratory failure with hypoxia (HCC) Continue oxygen at 2 liters due to pulmonary htn  3. Slow transit constipation Controlled, continue miralax and colace. Discussed that she can hold a dose if she is having loose stools  4. Cor pulmonale, chronic (HCC) Pulmonary artery pressure 72 06/15/17 with severe tricuspid regurg Continuous oxygen  Compression wraps on in the am off in the pm Monitor weight, try to keep under 140   5. Permanent atrial fibrillation Rate controlled. Continue coumadin to maintain INR b/w 2-3 for CVA risk reduction   6. Chronic right shoulder pain Continue tylenol and voltaren gel Continue exercises    Family/ staff Communication: resident  Labs/tests ordered:  NA

## 2018-06-30 ENCOUNTER — Encounter: Payer: Self-pay | Admitting: Adult Health

## 2018-06-30 ENCOUNTER — Non-Acute Institutional Stay (SKILLED_NURSING_FACILITY): Payer: Medicare Other | Admitting: Adult Health

## 2018-06-30 DIAGNOSIS — T148XXA Other injury of unspecified body region, initial encounter: Secondary | ICD-10-CM

## 2018-06-30 DIAGNOSIS — L089 Local infection of the skin and subcutaneous tissue, unspecified: Secondary | ICD-10-CM | POA: Diagnosis not present

## 2018-06-30 DIAGNOSIS — K5901 Slow transit constipation: Secondary | ICD-10-CM | POA: Diagnosis not present

## 2018-06-30 NOTE — Progress Notes (Signed)
Location:  Occup[facility] psychologist of Service:  SNF (31) Provider:   Cindi Carbon, ANP [place] [phone]  [name], DO  Patient Care Team: [name], DO as PCP - General (Geriatric Medicine) Sanda Klein, MD as PCP - Cardiology (Cardiology)  Extended Emergency Contact Information Primary Emergency Contact: [name] of [place] Phone: [phone] Work Phone: [phone] Mobile Phone: [phone] Relation: Daughter Secondary Emergency Contact: Cicily, [place] Work Phone: [phone] Mobile Phone: [phone] Relation: Son  Code Status:  DNR Goals of care: Advanced Directive information Advanced Directives 03/29/2018  Does Patient Have a Medical Advance Directive? Yes  Type of Paramedic of [place];Living will  Does patient want to make changes to medical advance directive? No - Patient declined  Copy of [place] in Chart? Yes - validated most recent copy scanned in chart (See row information)  Would patient like information on creating a medical advance directive? No - Patient declined  Pre-existing out of facility DNR order (yellow form or pink MOST form) -     Chief Complaint  Patient presents with  . Acute Visit    lef leg wound drainage/pain    HPI:  Pt is a 85 y.o. female seen today for an acute visit for left leg wound with increased drainage and pain. These symptoms have been present for 1-2 days per the nurse and patient. She has not had any shaking, chills, fever, or red streaking. She has a wound to the left lower leg from prior skin cancer removal which has been present for months with prolonged healing. The area has expanded with serosang drainage to wound with increased amount and maceration to the skin. The nurse reports surrounding redness. The resident reports pain to the wound which has awaken her from sleep.  Resident feels the current  bowel regimen is too strong and she would like the colace discontinued.   Past Medical History:  Diagnosis Date  . Breast cancer ([place])    left   . Chronic anticoagulation   . Chronic atrial fibrillation   . Hypertension   . Pelvic fracture ([place]) 05/2016   Past Surgical History:  Procedure Laterality Date  . ABDOMINAL HYSTERECTOMY    . BREAST LUMPECTOMY      Allergies  Allergen Reactions  . Aspirin     Upset stomach  . Codeine Nausea Only    Nausea     Outpatient Encounter Medications as of 06/30/2018  Medication Sig  . polyethylene glycol (MIRALAX / GLYCOLAX) 17 g packet Take 17 g by mouth 2 (two) times daily.  Marland Kitchen acetaminophen (TYLENOL) 500 MG tablet Take 1,000 mg by mouth 3 (three) times daily.   Marland Kitchen allopurinol (ZYLOPRIM) 100 MG tablet Take 100 mg by mouth at bedtime.   . ALPRAZolam (XANAX) 0.5 MG tablet Take 1 tablet (0.5 mg total) by mouth at bedtime.  Marland Kitchen antiseptic oral rinse (BIOTENE) LIQD 15 mLs by Mouth Rinse route as needed for dry mouth.  . Calcium Carbonate-Vitamin D3 (CALCIUM 600-D) 600-400 MG-UNIT TABS Take 1 tablet by mouth daily.  Marland Kitchen dextromethorphan (DELSYM) 30 MG/5ML liquid Take 30 mg by mouth every 12 (twelve) hours as needed for cough.  . diclofenac sodium (VOLTAREN) 1 % GEL Apply 2 g topically 4 (four) times daily. Right shoulder  . docusate sodium (COLACE) 100 MG capsule Take 100 mg by mouth at bedtime.  . feeding supplement (BOOST HIGH PROTEIN) LIQD Take 1 Container by mouth  2 (two) times daily between meals.  Marland Kitchen guaiFENesin (MUCINEX) 600 MG 12 hr tablet Take 600 mg by mouth 2 (two) times daily.  Marland Kitchen ipratropium (ATROVENT) 0.02 % nebulizer solution Take 2.5 mLs (0.5 mg total) by nebulization 3 (three) times daily.  Marland Kitchen levalbuterol (XOPENEX) 1.25 MG/0.5ML nebulizer solution Take 1.25 mg by nebulization 3 (three) times daily.  . metoprolol succinate (TOPROL-XL) 50 MG 24 hr tablet Take 1 tablet (50 mg total) by mouth daily.  . potassium chloride SA (K-DUR,KLOR-CON)  20 MEQ tablet Take 20 mEq by mouth daily.  Marland Kitchen torsemide (DEMADEX) 20 MG tablet Take 40 mg by mouth daily.  Marland Kitchen warfarin (COUMADIN) 2 MG tablet Take 2-4 mg by mouth daily. Pt takes one tablet daily (2 mg) daily except for on Thursday pt takes two tablets (4 mg)  . [DISCONTINUED] lubiprostone (AMITIZA) 24 MCG capsule Take 1 capsule (24 mcg total) by mouth 2 (two) times daily with a meal.   No facility-administered encounter medications on file as of 06/30/2018.     Review of Systems  Constitutional: Negative for activity change, appetite change, chills, diaphoresis, fatigue, fever and unexpected weight change.  HENT: Negative for congestion.   Respiratory: Positive for cough and shortness of breath. Negative for wheezing.   Cardiovascular: Negative for chest pain, palpitations and leg swelling.  Gastrointestinal: Positive for diarrhea. Negative for abdominal distention, abdominal pain and constipation.  Genitourinary: Negative for difficulty urinating and dysuria.  Musculoskeletal: Positive for gait problem. Negative for arthralgias, back pain, joint swelling and myalgias.  Skin: Positive for color change and wound.  Neurological: Negative for dizziness, tremors, seizures, syncope, facial asymmetry, speech difficulty, weakness, light-headedness, numbness and headaches.  Psychiatric/Behavioral: Negative for agitation, behavioral problems and confusion.    Immunization History  Administered Date(s) Administered  . Influenza Split 12/07/2014  . Influenza-Unspecified 11/05/2013  . Pneumococcal Polysaccharide-23 03/02/2012  . Tdap 03/02/2012  . Zoster 03/03/2011   Pertinent  Health Maintenance Due  Topic Date Due  . PNA vac Low Risk Adult (2 of 2 - PCV13) 03/02/2013  . INFLUENZA VACCINE  10/01/2018  . DEXA SCAN  Completed   No flowsheet data found. Functional Status Survey:    Vitals:   06/30/18 1429  Temp: (!) 97 F (36.1 C)   There is no height or weight on file to calculate BMI.  Physical Exam Constitutional:      Appearance: Normal appearance.  Musculoskeletal:     Right lower leg: No edema.     Left lower leg: No edema.  Skin:    General: Skin is warm and dry.     Comments: LLE wound with 100% beefy red wound bed with crater like area to the center. Surrounding wound with maceration and erythema. Serosanq drainage noted on dressing when removed. Tender to touch. Erythema is localized and does not spread. BLE have venous insuff related changes.   Neurological:     Mental Status: She is alert.     Labs reviewed: Recent Labs    11/18/17 1832 11/19/17 0231 03/14/18 1407 03/15/18 0820 03/21/18 0300 04/11/18 04/18/18  NA  --  139 139 135 139 141 144  K  --  3.2* 4.5 4.2 4.3 4.4 4.1  CL  --  96* 96* 97*  --   --   --   CO2  --  _0 --   --   --   GLUCOSE  --  151* 136* 141*  --   --   --  BUN  --  22 45* 51* 37* 45* 44*  CREATININE  --  1.12* 1.23* 1.05* 0.8 0.8 1.0  CALCIUM  --  9.1 9.6 8.9  --   --   --   MG 1.8  --   --   --   --   --   --   PHOS 3.2  --   --   --   --   --   --    Recent Labs    11/18/17 1259 03/15/18 0820  AST 26 31  ALT 16 20  ALKPHOS 108 81  BILITOT 4.9* 1.2  PROT 7.0 6.3*  ALBUMIN 3.7 3.4*   Recent Labs    11/19/17 0231 03/14/18 1407 03/15/18 0820 03/21/18 0300  WBC 15.5* 10.9* 10.5 10.9  NEUTROABS  --  7.9*  --   --   HGB 13.2 15.3* 13.5 14.5  HCT 38.9 47.4* 40.6 43  MCV 107.2* 111.0* 109.1*  --   PLT 150 164 146* 205   Lab Results  Component Value Date   TSH 1.602 11/18/2017   No results found for: HGBA1C No results found for: CHOL, HDL, LDLCALC, LDLDIRECT, TRIG, CHOLHDL  Significant Diagnostic Results in last 30 days:  No results found.  Assessment/Plan 1. Wound infection Due to prolonged healing associated with skin cancer removal. She also has underlying venous insuff and heart failure which have further delayed the healing process.  Not systemic Begin Bactrim DS 1 tab bid x 7 days  Florastor 1 tab bid x 7 days Apply absorptive dressing due to maceration and have wound nurse follow Elevate legs   2. Slow transit constipation Discontinue colace per resident request, continue miralax bid.     Family/ staff Communication:resident  Labs/tests ordered:  NA

## 2018-07-12 ENCOUNTER — Other Ambulatory Visit: Payer: Self-pay | Admitting: Internal Medicine

## 2018-07-12 DIAGNOSIS — F419 Anxiety disorder, unspecified: Secondary | ICD-10-CM

## 2018-07-12 MED ORDER — ALPRAZOLAM 0.5 MG PO TABS: 0.5000 mg | ORAL_TABLET | Freq: Every day | ORAL | 5 refills | Status: DC

## 2018-07-13 ENCOUNTER — Other Ambulatory Visit: Payer: Self-pay | Admitting: Internal Medicine

## 2018-07-13 MED ORDER — METOPROLOL SUCCINATE ER 25 MG PO TB24: 25.0000 mg | ORAL_TABLET | Freq: Every day | ORAL | 5 refills | Status: DC

## 2018-07-15 DIAGNOSIS — I482 Chronic atrial fibrillation, unspecified: Secondary | ICD-10-CM | POA: Diagnosis not present

## 2018-07-18 ENCOUNTER — Encounter: Payer: Self-pay | Admitting: Adult Health

## 2018-07-18 ENCOUNTER — Other Ambulatory Visit: Payer: Self-pay

## 2018-07-18 ENCOUNTER — Non-Acute Institutional Stay (SKILLED_NURSING_FACILITY): Payer: Medicare Other | Admitting: Adult Health

## 2018-07-18 DIAGNOSIS — T148XXA Other injury of unspecified body region, initial encounter: Secondary | ICD-10-CM

## 2018-07-18 DIAGNOSIS — J440 Chronic obstructive pulmonary disease with acute lower respiratory infection: Secondary | ICD-10-CM

## 2018-07-18 DIAGNOSIS — L089 Local infection of the skin and subcutaneous tissue, unspecified: Secondary | ICD-10-CM | POA: Diagnosis not present

## 2018-07-18 DIAGNOSIS — I4821 Permanent atrial fibrillation: Secondary | ICD-10-CM | POA: Diagnosis not present

## 2018-07-18 DIAGNOSIS — J209 Acute bronchitis, unspecified: Secondary | ICD-10-CM

## 2018-07-18 NOTE — Progress Notes (Signed)
Location:  Occupational psychologist of Service:  SNF (31) Provider:   Cindi Carbon, ANP [place] [phone]   [name], DO  Patient Care Team: [name], DO as PCP - General (Geriatric Medicine) Sanda Klein, MD as PCP - Cardiology (Cardiology)  Extended Emergency Contact Information Primary Emergency Contact: [name] of [place] Phone: [phone] Work Phone: [phone] Mobile Phone: [phone] Relation: Daughter Secondary Emergency Contact: Myles, [place] Work Phone: [phone] Mobile Phone: [phone] Relation: Son  Code Status:  DNR Goals of care: Advanced Directive information Advanced Directives 03/29/2018  Does Patient Have a Medical Advance Directive? Yes  Type of Paramedic of [place];Living will  Does patient want to make changes to medical advance directive? No - Patient declined  Copy of [place] in Chart? Yes - validated most recent copy scanned in chart (See row information)  Would patient like information on creating a medical advance directive? No - Patient declined  Pre-existing out of facility DNR order (yellow form or pink MOST form) -     Chief Complaint  Patient presents with  . Acute Visit    left leg wound painful and cough worse    HPI:  Pt is a 85 y.o. female seen today for an acute visit for left leg wound and worsening cough. She has a hx of removal of a leg lesion on the left that has been difficult to heal. The area is increasing in size, much more painful, and more swelling and redness are noted. She was on Bactrim in April for the same issue which seemed to help but got worse when the medication was stopped. The oncall [facility] provider was notified this weekend due to the worsening of the wound and the pain. She was started on Keflex and ultram which seems to have helped. She is also reporting that her chronic cough is  worse. No sputum production. No fever, no change in 02 sats. She feels that her nose is irritated from the oxygen. She has sputum in the back of her throat and can't "get it up" and the mucinex does not seem to be helping. She has chronic DOE and she reports this has not changed. Her weight has been creeping up but is better over the past fews days, currently at 138 lbs. Edema unchanged in the lower legs. She no longer wants to wear compression wraps.  Also her nurse reports that the Toprol was held over the weekend for a SBP of 97 but then her pulse went up to 115.  Past Medical History:  Diagnosis Date  . Breast cancer ([place])    left   . Chronic anticoagulation   . Chronic atrial fibrillation   . Hypertension   . Pelvic fracture ([place]) 05/2016   Past Surgical History:  Procedure Laterality Date  . ABDOMINAL HYSTERECTOMY    . BREAST LUMPECTOMY      Allergies  Allergen Reactions  . Aspirin     Upset stomach  . Codeine Nausea Only    Nausea     Outpatient Encounter Medications as of 07/18/2018  Medication Sig  . acetaminophen (TYLENOL) 325 MG tablet Take 650 mg by mouth every 4 (four) hours as needed.  . cephALEXin (KEFLEX) 500 MG capsule Take 500 mg by mouth 3 (three) times daily.  . traMADol (ULTRAM) 50 MG tablet Take 25 mg by mouth 3 (three) times daily.  Marland Kitchen allopurinol (ZYLOPRIM) 100 MG tablet  Take 100 mg by mouth at bedtime.   . ALPRAZolam (XANAX) 0.5 MG tablet Take 1 tablet (0.5 mg total) by mouth at bedtime.  Marland Kitchen antiseptic oral rinse (BIOTENE) LIQD 15 mLs by Mouth Rinse route as needed for dry mouth.  . Calcium Carbonate-Vitamin D3 (CALCIUM 600-D) 600-400 MG-UNIT TABS Take 1 tablet by mouth daily.  Marland Kitchen dextromethorphan (DELSYM) 30 MG/5ML liquid Take 30 mg by mouth every 12 (twelve) hours as needed for cough.  . diclofenac sodium (VOLTAREN) 1 % GEL Apply 2 g topically 4 (four) times daily. Right shoulder  . docusate sodium (COLACE) 100 MG capsule Take 100 mg by mouth at bedtime.   . feeding supplement (BOOST HIGH PROTEIN) LIQD Take 1 Container by mouth 2 (two) times daily between meals.  Marland Kitchen guaiFENesin (MUCINEX) 600 MG 12 hr tablet Take 600 mg by mouth 2 (two) times daily.  Marland Kitchen ipratropium (ATROVENT) 0.02 % nebulizer solution Take 2.5 mLs (0.5 mg total) by nebulization 3 (three) times daily.  Marland Kitchen levalbuterol (XOPENEX) 1.25 MG/0.5ML nebulizer solution Take 1.25 mg by nebulization 3 (three) times daily.  . metoprolol succinate (TOPROL-XL) 25 MG 24 hr tablet Take 1 tablet (25 mg total) by mouth daily.  . polyethylene glycol (MIRALAX / GLYCOLAX) 17 g packet Take 17 g by mouth 2 (two) times daily.  . potassium chloride SA (K-DUR,KLOR-CON) 20 MEQ tablet Take 20 mEq by mouth daily.  Marland Kitchen torsemide (DEMADEX) 20 MG tablet Take 40 mg by mouth daily.  Marland Kitchen warfarin (COUMADIN) 2 MG tablet Take 2-4 mg by mouth daily. Pt takes one tablet daily (2 mg) Mon Wed Fri, 4 mg Tues Thurs Sat Sun  . [DISCONTINUED] acetaminophen (TYLENOL) 500 MG tablet Take 1,000 mg by mouth 3 (three) times daily.    No facility-administered encounter medications on file as of 07/18/2018.     Review of Systems  Constitutional: Negative for activity change, appetite change, chills, diaphoresis, fatigue, fever and unexpected weight change.  HENT: Negative for congestion.   Respiratory: Positive for cough and shortness of breath. Negative for wheezing.   Cardiovascular: Positive for leg swelling. Negative for chest pain and palpitations.  Gastrointestinal: Negative for abdominal distention, abdominal pain, constipation and diarrhea.  Genitourinary: Negative for difficulty urinating and dysuria.  Musculoskeletal: Positive for gait problem. Negative for arthralgias, back pain, joint swelling and myalgias.  Skin: Positive for wound.  Neurological: Negative for dizziness, tremors, seizures, syncope, facial asymmetry, speech difficulty, weakness, light-headedness, numbness and headaches.  Psychiatric/Behavioral: Negative for  agitation, behavioral problems and confusion.    Immunization History  Administered Date(s) Administered  . Influenza Split 12/07/2014  . Influenza-Unspecified 11/05/2013  . Pneumococcal Polysaccharide-23 03/02/2012  . Tdap 03/02/2012  . Zoster 03/03/2011   Pertinent  Health Maintenance Due  Topic Date Due  . PNA vac Low Risk Adult (2 of 2 - PCV13) 03/02/2013  . INFLUENZA VACCINE  10/01/2018  . DEXA SCAN  Completed   No flowsheet data found. Functional Status Survey:    Vitals:   07/18/18 1236  BP: 121/76  Pulse: 81  Resp: 18  Temp: 97.8 F (36.6 C)  SpO2: 95%  Weight: 138 lb (62.6 kg)   Body mass index is 23.69 kg/m. Physical Exam Vitals signs and nursing note reviewed.  Constitutional:      General: She is not in acute distress.    Appearance: She is not diaphoretic.  HENT:     Head: Normocephalic and atraumatic.     Right Ear: Tympanic membrane and external ear normal.  Left Ear: Tympanic membrane, ear canal and external ear normal.     Nose: Rhinorrhea present. No congestion.     Comments: Erythema to left nare    Mouth/Throat:     Mouth: Mucous membranes are moist.     Pharynx: Oropharynx is clear. No oropharyngeal exudate.  Eyes:     General:        Right eye: No discharge.        Left eye: No discharge.     Conjunctiva/sclera: Conjunctivae normal.     Pupils: Pupils are equal, round, and reactive to light.     Comments: Burst blood vessel to right eye  Neck:     Musculoskeletal: No neck rigidity.     Vascular: No JVD.  Cardiovascular:     Rate and Rhythm: Normal rate. Rhythm irregular.     Pulses:          Dorsalis pedis pulses are 2+ on the right side and 2+ on the left side.       Posterior tibial pulses are 0 on the right side and 0 on the left side.     Heart sounds: No murmur.  Pulmonary:     Effort: Pulmonary effort is normal. No respiratory distress.     Breath sounds: Rales present. No wheezing.     Comments: Decreased bilateral  breath sounds Abdominal:     General: Abdomen is flat. Bowel sounds are normal.     Palpations: Abdomen is soft.  Musculoskeletal:     Right lower leg: Edema present.     Left lower leg: Edema present.  Lymphadenopathy:     Cervical: No cervical adenopathy.  Skin:    General: Skin is warm and dry.     Comments: Left lateral lower extremity wound increased size 100% dark red/ruborish wound bed and very tender to touch. Surrounding erythema noted. Mild swelling noted. No drainage. No warmth  Neurological:     Mental Status: She is alert and oriented to person, place, and time.  Psychiatric:        Mood and Affect: Mood normal.     Labs reviewed: Recent Labs    11/18/17 1832 11/19/17 0231 03/14/18 1407 03/15/18 0820 03/21/18 0300 04/11/18 04/18/18  NA  --  139 139 135 139 141 144  K  --  3.2* 4.5 4.2 4.3 4.4 4.1  CL  --  96* 96* 97*  --   --   --   CO2  --  _0 --   --   --   GLUCOSE  --  151* 136* 141*  --   --   --   BUN  --  22 45* 51* 37* 45* 44*  CREATININE  --  1.12* 1.23* 1.05* 0.8 0.8 1.0  CALCIUM  --  9.1 9.6 8.9  --   --   --   MG 1.8  --   --   --   --   --   --   PHOS 3.2  --   --   --   --   --   --    Recent Labs    11/18/17 1259 03/15/18 0820  AST 26 31  ALT 16 20  ALKPHOS 108 81  BILITOT 4.9* 1.2  PROT 7.0 6.3*  ALBUMIN 3.7 3.4*   Recent Labs    11/19/17 0231 03/14/18 1407 03/15/18 0820 03/21/18 0300  WBC 15.5* 10.9* 10.5 10.9  NEUTROABS  --  7.9*  --   --  HGB 13.2 15.3* 13.5 14.5  HCT 38.9 47.4* 40.6 43  MCV 107.2* 111.0* 109.1*  --   PLT 150 164 146* 205   Lab Results  Component Value Date   TSH 1.602 11/18/2017   No results found for: HGBA1C No results found for: CHOL, HDL, LDLCALC, LDLDIRECT, TRIG, CHOLHDL  Significant Diagnostic Results in last 30 days:  No results found.  Assessment/Plan  1. Acute bronchitis with COPD ([place]) Increase Atrovent and Xopenex neb to 4 x daily x 1 week then return to tid Add Pulmicort 1.5  mg neb x 1 week Flonase 2 sprays each nare x 1 week then 1 spray qd  D/C mucinex Claritin 10 mg qd  2. Wound infection Complete 14 day course of keflex, will try longer course  Leg elevation Wound nurse to follow with dressing change recommendations.  Will check ABI due to non healing wound, able to palpate dorsalis pedis pulse but not posterior tibial pulse.  3. Afib Hold Toprol 25 mg for SBP <95  Continue coumadin alternating dosage for CVA risk reduction

## 2018-07-19 ENCOUNTER — Non-Acute Institutional Stay (SKILLED_NURSING_FACILITY): Payer: Medicare Other | Admitting: Internal Medicine

## 2018-07-19 ENCOUNTER — Encounter: Payer: Self-pay | Admitting: Internal Medicine

## 2018-07-19 DIAGNOSIS — J439 Emphysema, unspecified: Secondary | ICD-10-CM

## 2018-07-19 DIAGNOSIS — L089 Local infection of the skin and subcutaneous tissue, unspecified: Secondary | ICD-10-CM

## 2018-07-19 DIAGNOSIS — J9611 Chronic respiratory failure with hypoxia: Secondary | ICD-10-CM

## 2018-07-19 DIAGNOSIS — T148XXA Other injury of unspecified body region, initial encounter: Secondary | ICD-10-CM | POA: Diagnosis not present

## 2018-07-19 DIAGNOSIS — R52 Pain, unspecified: Secondary | ICD-10-CM | POA: Diagnosis not present

## 2018-07-19 DIAGNOSIS — I4821 Permanent atrial fibrillation: Secondary | ICD-10-CM

## 2018-07-19 MED ORDER — TRAMADOL HCL 50 MG PO TABS: 25.0000 mg | ORAL_TABLET | Freq: Three times a day (TID) | ORAL | 0 refills | Status: DC

## 2018-07-19 NOTE — Progress Notes (Signed)
Patient ID: [name], female   DOB: 12-Jan-1925, 85 y.o.   MRN: [id number]  Location:  [facility] Room Number: 137 Place of Service:  SNF ([phone] Provider:   [name], DO  Patient Care Team: [name], DO as PCP - General (Geriatric Medicine) Sanda Klein, MD as PCP - Cardiology (Cardiology)  Extended Emergency Contact Information Primary Emergency Contact: [name] of [place] Phone: [phone] Work Phone: [phone] Mobile Phone: [phone] Relation: Daughter Secondary Emergency Contact: Lesleigh Noe Work Phone: [phone] Mobile Phone: [phone] Relation: Son  Code Status:  DNR Goals of care: Advanced Directive information Advanced Directives 03/29/2018  Does Patient Have a Medical Advance Directive? Yes  Type of Paramedic of [place];Living will  Does patient want to make changes to medical advance directive? No - Patient declined  Copy of [place] in Chart? Yes - validated most recent copy scanned in chart (See row information)  Would patient like information on creating a medical advance directive? No - Patient declined  Pre-existing out of facility DNR order (yellow form or pink MOST form) -     Chief Complaint  Patient presents with  . Medical Management of Chronic Issues    Routine Visit    HPI:  Pt is a 85 y.o. female seen today for medical management of chronic diseases.    She's just begun treatment for an infected wound on her left leg.    Right shoulder still painful at times, but better right now with pain medication for her left leg.    Was getting ABIs done when I visited and left leg was too painful to pump up cuff maximally but tech was able to pump up to 180s and still had pulse on that side.  Right did suggest arterial disease.  Final results will likely say full test could not be completed b/c of the challenges with  pain on the left side.    Her breathing remains short with minimal exertion, but sats remain good.  She is getting her nebs which help.    Past Medical History:  Diagnosis Date  . Breast cancer ([place])    left   . Chronic anticoagulation   . Chronic atrial fibrillation   . Hypertension   . Pelvic fracture ([place]) 05/2016   Past Surgical History:  Procedure Laterality Date  . ABDOMINAL HYSTERECTOMY    . BREAST LUMPECTOMY      Allergies  Allergen Reactions  . Aspirin     Upset stomach  . Codeine Nausea Only    Nausea     Outpatient Encounter Medications as of 07/19/2018  Medication Sig  . acetaminophen (TYLENOL) 325 MG tablet Take 650 mg by mouth every 4 (four) hours as needed.  Marland Kitchen allopurinol (ZYLOPRIM) 100 MG tablet Take 100 mg by mouth at bedtime.   . ALPRAZolam (XANAX) 0.5 MG tablet Take 1 tablet (0.5 mg total) by mouth at bedtime.  Marland Kitchen antiseptic oral rinse (BIOTENE) LIQD 15 mLs by Mouth Rinse route as needed for dry mouth.  . benzonatate (TESSALON) 100 MG capsule Take 100 mg by mouth 3 (three) times daily as needed for cough.  . Calcium Carbonate-Vitamin D3 (CALCIUM 600-D) 600-400 MG-UNIT TABS Take 1 tablet by mouth daily.  . cephALEXin (KEFLEX) 500 MG capsule Take 500 mg by mouth 3 (three) times daily.  Marland Kitchen dextromethorphan (DELSYM) 30 MG/5ML liquid Take 30 mg by mouth every 12 (twelve) hours as needed for cough.  Marland Kitchen  diclofenac sodium (VOLTAREN) 1 % GEL Apply 2 g topically 4 (four) times daily. Right shoulder  . feeding supplement (BOOST HIGH PROTEIN) LIQD Take 1 Container by mouth 2 (two) times daily between meals.  . fluticasone (FLONASE) 50 MCG/ACT nasal spray Place 2 sprays into both nostrils daily.  Marland Kitchen ipratropium (ATROVENT) 0.02 % nebulizer solution Take 2.5 mLs (0.5 mg total) by nebulization 3 (three) times daily.  Marland Kitchen levalbuterol (XOPENEX) 1.25 MG/0.5ML nebulizer solution Take 1.25 mg by nebulization 3 (three) times daily.  Marland Kitchen loratadine (CLARITIN) 10 MG tablet Take 10 mg by  mouth at bedtime.  . metoprolol succinate (TOPROL-XL) 25 MG 24 hr tablet Take 1 tablet (25 mg total) by mouth daily.  . polyethylene glycol (MIRALAX / GLYCOLAX) 17 g packet Take 17 g by mouth 2 (two) times daily.  . potassium chloride SA (K-DUR,KLOR-CON) 20 MEQ tablet Take 20 mEq by mouth daily.  . sodium chloride (OCEAN) 0.65 % SOLN nasal spray Place 1 spray into both nostrils as needed for congestion.  . torsemide (DEMADEX) 20 MG tablet Take 40 mg by mouth daily.  . traMADol (ULTRAM) 50 MG tablet Take 25 mg by mouth 3 (three) times daily.  Marland Kitchen warfarin (COUMADIN) 2 MG tablet Take 2-4 mg by mouth daily. Pt takes one tablet daily (2 mg) Mon Wed Fri, 4 mg Tues Thurs Sat Sun  . [DISCONTINUED] docusate sodium (COLACE) 100 MG capsule Take 100 mg by mouth at bedtime.  . [DISCONTINUED] guaiFENesin (MUCINEX) 600 MG 12 hr tablet Take 600 mg by mouth 2 (two) times daily.   No facility-administered encounter medications on file as of 07/19/2018.     Review of Systems  Constitutional: Positive for fatigue. Negative for activity change, appetite change, chills and fever.       Supposedly wt up 5 lbs in one day   HENT: Negative for congestion.   Respiratory: Positive for shortness of breath. Negative for cough and wheezing.   Cardiovascular: Positive for leg swelling. Negative for chest pain and palpitations.       Gets edema, but minimal now  Gastrointestinal: Negative for abdominal pain and constipation.  Genitourinary: Negative for dysuria.  Musculoskeletal: Positive for arthralgias and gait problem.  Skin: Negative for color change.  Neurological: Negative for dizziness and weakness.  Hematological: Bruises/bleeds easily.  Psychiatric/Behavioral: The patient is nervous/anxious.        Some memory loss    Immunization History  Administered Date(s) Administered  . Influenza Split 12/07/2014  . Influenza-Unspecified 11/05/2013, 12/02/2016, 12/16/2017  . Pneumococcal Polysaccharide-23 03/02/2012   . Tdap 03/02/2012  . Zoster 03/03/2011   Pertinent  Health Maintenance Due  Topic Date Due  . PNA vac Low Risk Adult (2 of 2 - PCV13) 03/02/2013  . INFLUENZA VACCINE  10/01/2018  . DEXA SCAN  Completed   No flowsheet data found. Functional Status Survey:    Vitals:   07/19/18 1407  BP: 99/65  Pulse: 81  Resp: 18  Temp: 97.8 F (36.6 C)  TempSrc: Oral  SpO2: 96%  Weight: 143 lb (64.9 kg)  Height: _0  (1.626 m)   Body mass index is 24.55 kg/m. Physical Exam Vitals signs reviewed.  Constitutional:      General: She is not in acute distress.    Appearance: Normal appearance. She is normal weight. She is not ill-appearing or toxic-appearing.  HENT:     Head: Normocephalic and atraumatic.  Neck:     Musculoskeletal: Neck supple.  Cardiovascular:     Rate  and Rhythm: Rhythm irregular.  Pulmonary:     Effort: Pulmonary effort is normal.     Breath sounds: No wheezing.  Abdominal:     General: Bowel sounds are normal.     Palpations: Abdomen is soft.  Musculoskeletal: Normal range of motion.        General: Tenderness present.     Comments: Right shoulder  Skin:    Comments: Left anterior shin with small ulcerated area with pink tissue centrally, no significant erythema around it, but leg is tender to touch even lightly (was biopsy site), second area with thick eschar over it more proximally (just distal to knee and medial); multiple raised areas on chest that look like squamous cell ca   Neurological:     General: No focal deficit present.     Mental Status: She is alert and oriented to person, place, and time.     Comments: Does repeat herself some  Psychiatric:        Mood and Affect: Mood normal.     Labs reviewed: Recent Labs    11/18/17 1832 11/19/17 0231 03/14/18 1407 03/15/18 0820 03/21/18 0300 04/11/18 04/18/18  NA  --  139 139 135 139 141 144  K  --  3.2* 4.5 4.2 4.3 4.4 4.1  CL  --  96* 96* 97*  --   --   --   CO2  --  _0 --   --   --    GLUCOSE  --  151* 136* 141*  --   --   --   BUN  --  22 45* 51* 37* 45* 44*  CREATININE  --  1.12* 1.23* 1.05* 0.8 0.8 1.0  CALCIUM  --  9.1 9.6 8.9  --   --   --   MG 1.8  --   --   --   --   --   --   PHOS 3.2  --   --   --   --   --   --    Recent Labs    11/18/17 1259 03/15/18 0820  AST 26 31  ALT 16 20  ALKPHOS 108 81  BILITOT 4.9* 1.2  PROT 7.0 6.3*  ALBUMIN 3.7 3.4*   Recent Labs    11/19/17 0231 03/14/18 1407 03/15/18 0820 03/21/18 0300  WBC 15.5* 10.9* 10.5 10.9  NEUTROABS  --  7.9*  --   --   HGB 13.2 15.3* 13.5 14.5  HCT 38.9 47.4* 40.6 43  MCV 107.2* 111.0* 109.1*  --   PLT 150 164 146* 205   Lab Results  Component Value Date   TSH 1.602 11/18/2017   No results found for: HGBA1C No results found for: CHOL, HDL, LDLCALC, LDLDIRECT, TRIG, CHOLHDL  Assessment/Plan 1. Wound infection - seems improved already - complete course of abx as ordered by NP - monitor wound closely and keep wound care nurse abreast of changes - traMADol (ULTRAM) 50 MG tablet; Take 0.5 tablets (25 mg total) by mouth 3 (three) times daily.  Dispense: 45 tablet; Refill: 0 -final ABI result pending, but she did have some arterial disease noted but did have dopplerable pulses in both feet though dp on right was diminished  2. Pulmonary emphysema, unspecified emphysema type ([place]) -cont increased nebs -pt w/o wheezing today and appears comfortable at rest  3. Chronic respiratory failure with hypoxia (HCC) -cont O2 use  4. Permanent atrial fibrillation -rate is controlled with toprol, cont it  and warfarin for INR goal 2-3  -rechecked INR due to keflex and it remains within ; keep regular INR check which should be on a week day  Family/ staff Communication: discussed with snf nurse   Labs/tests ordered:  No new  Leldon Steege L. River Ambrosio, D.O. [facility] [address], [address] [address] Cell Phone (Mon-Fri 8am-5pm):  [phone] On  Call:  [phone] & follow prompts after 5pm & weekends Office Phone:  [phone] Office Fax:  [phone]

## 2018-07-20 DIAGNOSIS — I482 Chronic atrial fibrillation, unspecified: Secondary | ICD-10-CM | POA: Diagnosis not present

## 2018-07-29 DIAGNOSIS — I482 Chronic atrial fibrillation, unspecified: Secondary | ICD-10-CM | POA: Diagnosis not present

## 2018-08-09 ENCOUNTER — Non-Acute Institutional Stay (SKILLED_NURSING_FACILITY): Payer: Medicare Other | Admitting: Internal Medicine

## 2018-08-09 DIAGNOSIS — M79605 Pain in left leg: Secondary | ICD-10-CM | POA: Diagnosis not present

## 2018-08-09 DIAGNOSIS — S81802D Unspecified open wound, left lower leg, subsequent encounter: Secondary | ICD-10-CM | POA: Diagnosis not present

## 2018-08-09 NOTE — Progress Notes (Signed)
Patient ID: [name], female   DOB: 09-Jan-1925, 85 y.o.   MRN: [id number]  Location:  [facility] Room Number: 137 Place of Service:  SNF (31) Provider:  Nekesha Font L. Mariea Clonts, D.O., C.M.D.   [name], DO  Patient Care Team: [name], DO as PCP - General (Geriatric Medicine) Sanda Klein, MD as PCP - Cardiology (Cardiology)  Extended Emergency Contact Information Primary Emergency Contact: [name] of [place] Phone: [phone] Work Phone: [phone] Mobile Phone: [phone] Relation: Daughter Secondary Emergency Contact: Jinny, [place] Work Phone: [phone] Mobile Phone: [phone] Relation: Son  Code Status:  DNR Goals of care: Advanced Directive information Advanced Directives 03/29/2018  Does Patient Have a Medical Advance Directive? Yes  Type of Paramedic of [place];Living will  Does patient want to make changes to medical advance directive? No - Patient declined  Copy of [place] in Chart? Yes - validated most recent copy scanned in chart (See row information)  Would patient like information on creating a medical advance directive? No - Patient declined  Pre-existing out of facility DNR order (yellow form or pink MOST form) -     Chief Complaint  Patient presents with  . Acute Visit    uncontrolled pain, left leg at non healing skin biopsy site    HPI:  Pt is a 85 y.o. female with complex medical history including oxygen-dependent COPD with chronic respiratory failure, chf, cor pulmonale/PAH among others seen today for an acute visit for uncontrolled pain of left lower leg at the site of prior skin biopsy for skin cancer. She lives in SNF due to ADL needs. [name] reported this am that the pain in her left leg was worse.  She's on tramadol 44m po bid prn and has tylenol also for pain.  She previously got "loopy" on scheduled tramadol and  opts not to take it routinely, prefers as needed.  The tylenol does help. Pain was quite significant just when touching kerlix wrap that was keeping the dressing in place even prior to trying to remove it. I had to cut the kerlix rather than unwrapping it due to pain.  There has been no surrounding redness since her last course of antibiotics.  See PE for wound appearance.  Past Medical History:  Diagnosis Date  . [place]   . Chronic anticoagulation   . Chronic atrial fibrillation   . Hypertension   . [place]   Past Surgical History:  Procedure Laterality Date  . ABDOMINAL HYSTERECTOMY    . BREAST LUMPECTOMY      Allergies  Allergen Reactions  . Aspirin     Upset stomach  . Codeine Nausea Only    Nausea     Outpatient Encounter Medications as of 08/09/2018  Medication Sig  . acetaminophen (TYLENOL) 325 MG tablet Take 650 mg by mouth every 4 (four) hours as needed.  .Marland Kitchenallopurinol (ZYLOPRIM) 100 MG tablet Take 100 mg by mouth at bedtime.   . ALPRAZolam (XANAX) 0.5 MG tablet Take 1 tablet (0.5 mg total) by mouth at bedtime.  .Marland Kitchenantiseptic oral rinse (BIOTENE) LIQD 15 mLs by Mouth Rinse route as needed for dry mouth.  . benzonatate (TESSALON) 100 MG capsule Take 100 mg by mouth 3 (three) times daily as needed for cough.  . Calcium Carbonate-Vitamin D3 (CALCIUM 600-D) 600-400 MG-UNIT TABS Take 1 tablet by mouth daily.  .Marland Kitchendextromethorphan (DELSYM)  30 MG/5ML liquid Take 30 mg by mouth every 12 (twelve) hours as needed for cough.  . diclofenac sodium (VOLTAREN) 1 % GEL Apply 2 g topically 4 (four) times daily. Right shoulder  . feeding supplement (BOOST HIGH PROTEIN) LIQD Take 1 Container by mouth 2 (two) times daily between meals.  . fluticasone (FLONASE) 50 MCG/ACT nasal spray Place 2 sprays into both nostrils daily.  Marland Kitchen ipratropium (ATROVENT) 0.02 % nebulizer solution Take 2.5 mLs (0.5 mg total) by nebulization 3 (three) times daily.  Marland Kitchen  levalbuterol (XOPENEX) 1.25 MG/0.5ML nebulizer solution Take 1.25 mg by nebulization 3 (three) times daily.  Marland Kitchen loratadine (CLARITIN) 10 MG tablet Take 10 mg by mouth at bedtime.  . metoprolol succinate (TOPROL-XL) 25 MG 24 hr tablet Take 1 tablet (25 mg total) by mouth daily.  . polyethylene glycol (MIRALAX / GLYCOLAX) 17 g packet Take 17 g by mouth 2 (two) times daily.  . potassium chloride SA (K-DUR,KLOR-CON) 20 MEQ tablet Take 20 mEq by mouth daily.  . sodium chloride (OCEAN) 0.65 % SOLN nasal spray Place 1 spray into both nostrils as needed for congestion.  . torsemide (DEMADEX) 20 MG tablet Take 40 mg by mouth daily.  . traMADol (ULTRAM) 50 MG tablet Take 0.5 tablets (25 mg total) by mouth 3 (three) times daily.  Marland Kitchen warfarin (COUMADIN) 2 MG tablet Take 2-4 mg by mouth daily. Pt takes one tablet daily (2 mg) Mon Wed Fri, 4 mg Tues Thurs Sat Sun  . [DISCONTINUED] cephALEXin (KEFLEX) 500 MG capsule Take 500 mg by mouth 3 (three) times daily.   No facility-administered encounter medications on file as of 08/09/2018.     Review of Systems  Constitutional: Negative for chills and fever.       Malaise  Respiratory: Negative for shortness of breath.   Skin:       Left shin pain at site of nonhealing skin cancer biopsy/ulcer    Immunization History  Administered Date(s) Administered  . Influenza Split 12/07/2014  . Influenza-Unspecified 11/05/2013, 12/02/2016, 12/16/2017  . Pneumococcal Polysaccharide-23 03/02/2012  . Tdap 03/02/2012  . Zoster 03/03/2011   Pertinent  Health Maintenance Due  Topic Date Due  . PNA vac Low Risk Adult (2 of 2 - PCV13) 03/02/2013  . INFLUENZA VACCINE  10/01/2018  . DEXA SCAN  Completed   No flowsheet data found. Functional Status Survey:    Vitals:   08/09/18 1052  BP: 111/76  Pulse: 75  Resp: 20  Temp: (!) 97.3 F (36.3 C)  TempSrc: Oral  SpO2: 98%  Weight: 138 lb (62.6 kg)  Height: _0  (1.626 m)   Body mass index is 23.69 kg/m. Physical  Exam Vitals signs reviewed.  Constitutional:      General: She is not in acute distress.    Appearance: Normal appearance. She is not toxic-appearing.  HENT:     Head: Normocephalic and atraumatic.  Cardiovascular:     Comments: irreg irreg Pulmonary:     Effort: Pulmonary effort is normal.     Breath sounds: Wheezing present.     Comments: Wearing O2 Skin:    Comments: Left anterior lower shin wound without surrounding erythema; has sanguinous drainage on dressing that has weeped through onto kerlix; ulceration itself is filled with beefy red to brown tissue with a small amount of yellow, wet appearing tissue mixed in; again, area was very tender with any manipulation of dressings   Neurological:     Mental Status: She is alert. Mental status  is at baseline.     Labs reviewed: Recent Labs    11/18/17 1832 11/19/17 0231 03/14/18 1407 03/15/18 0820 03/21/18 0300 04/11/18 04/18/18  NA  --  139 139 135 139 141 144  K  --  3.2* 4.5 4.2 4.3 4.4 4.1  CL  --  96* 96* 97*  --   --   --   CO2  --  _0 --   --   --   GLUCOSE  --  151* 136* 141*  --   --   --   BUN  --  22 45* 51* 37* 45* 44*  CREATININE  --  1.12* 1.23* 1.05* 0.8 0.8 1.0  CALCIUM  --  9.1 9.6 8.9  --   --   --   MG 1.8  --   --   --   --   --   --   PHOS 3.2  --   --   --   --   --   --    Recent Labs    11/18/17 1259 03/15/18 0820  AST 26 31  ALT 16 20  ALKPHOS 108 81  BILITOT 4.9* 1.2  PROT 7.0 6.3*  ALBUMIN 3.7 3.4*   Recent Labs    11/19/17 0231 03/14/18 1407 03/15/18 0820 03/21/18 0300  WBC 15.5* 10.9* 10.5 10.9  NEUTROABS  --  7.9*  --   --   HGB 13.2 15.3* 13.5 14.5  HCT 38.9 47.4* 40.6 43  MCV 107.2* 111.0* 109.1*  --   PLT 150 164 146* 205   Lab Results  Component Value Date   TSH 1.602 11/18/2017   Assessment/Plan 1. Wound of left lower extremity, subsequent encounter -I was concerned that the wound itself may be getting too moist  -discussed with wound care nurse who was  going to look at it also-may consider change to calcium alginate dressing to prevent from developing maceration and help with healing--does not appear to need santyl as of yet -has derm appt next week, 6/16 for reassessment of this area--await their input  2. Anterior leg pain, left -cont prn tramadol 11m bid and tylenol for breakthrough as pt does not want scheduled opioid due to confusion it previously caused her  Family/ staff Communication:  Discussed with SNF nurse and wound care nurse  Labs/tests ordered:  No orders of the defined types were placed in this encounter.  Jove Beyl L. Brigitte Soderberg, D.O. [facility] [address] [place] [id number] Phone (Mon-Fri 8am-5pm):  [phone] Call:  [phone]& follow prompts after 5pm & weekends Office Phone:  [phone] Fax:  [phone]

## 2018-08-10 ENCOUNTER — Encounter: Payer: Self-pay | Admitting: Internal Medicine

## 2018-08-11 DIAGNOSIS — Z20828 Contact with and (suspected) exposure to other viral communicable diseases: Secondary | ICD-10-CM | POA: Diagnosis not present

## 2018-08-12 DIAGNOSIS — I4891 Unspecified atrial fibrillation: Secondary | ICD-10-CM | POA: Diagnosis not present

## 2018-08-12 DIAGNOSIS — Z7901 Long term (current) use of anticoagulants: Secondary | ICD-10-CM | POA: Diagnosis not present

## 2018-08-16 DIAGNOSIS — D045 Carcinoma in situ of skin of trunk: Secondary | ICD-10-CM | POA: Diagnosis not present

## 2018-08-16 DIAGNOSIS — L570 Actinic keratosis: Secondary | ICD-10-CM | POA: Diagnosis not present

## 2018-08-16 DIAGNOSIS — L299 Pruritus, unspecified: Secondary | ICD-10-CM | POA: Diagnosis not present

## 2018-08-18 DIAGNOSIS — Z7901 Long term (current) use of anticoagulants: Secondary | ICD-10-CM | POA: Diagnosis not present

## 2018-08-18 DIAGNOSIS — I4891 Unspecified atrial fibrillation: Secondary | ICD-10-CM | POA: Diagnosis not present

## 2018-08-25 DIAGNOSIS — Z7901 Long term (current) use of anticoagulants: Secondary | ICD-10-CM | POA: Diagnosis not present

## 2018-08-25 DIAGNOSIS — I4891 Unspecified atrial fibrillation: Secondary | ICD-10-CM | POA: Diagnosis not present

## 2018-09-06 ENCOUNTER — Other Ambulatory Visit: Payer: Self-pay | Admitting: Internal Medicine

## 2018-09-06 DIAGNOSIS — M79605 Pain in left leg: Secondary | ICD-10-CM

## 2018-09-06 DIAGNOSIS — S81802D Unspecified open wound, left lower leg, subsequent encounter: Secondary | ICD-10-CM

## 2018-09-06 MED ORDER — HYDROCODONE-ACETAMINOPHEN 5-325 MG PO TABS: 1.0000 | ORAL_TABLET | Freq: Every evening | ORAL | 0 refills | Status: DC | PRN

## 2018-09-06 NOTE — Progress Notes (Signed)
Pt with severe pain at hs not relieved with tramadol.  Staff working on repositioning of leg and cradling device, but pt not sleeping due to pain.  Requests something stronger at hs--will try nightly hydrocodone prn.  Keep tramadol for days.  Monitor for delirium.

## 2018-09-08 DIAGNOSIS — I4891 Unspecified atrial fibrillation: Secondary | ICD-10-CM | POA: Diagnosis not present

## 2018-09-08 DIAGNOSIS — Z7901 Long term (current) use of anticoagulants: Secondary | ICD-10-CM | POA: Diagnosis not present

## 2018-09-08 LAB — PROTIME-INR: Protime: 31.1 — AB (ref 10.0–13.8)

## 2018-09-08 LAB — POCT INR: INR: 3.1 — AB (ref 0.9–1.1)

## 2018-09-09 ENCOUNTER — Encounter: Payer: Self-pay | Admitting: Adult Health

## 2018-09-09 ENCOUNTER — Non-Acute Institutional Stay (SKILLED_NURSING_FACILITY): Payer: Medicare Other | Admitting: Adult Health

## 2018-09-09 DIAGNOSIS — I10 Essential (primary) hypertension: Secondary | ICD-10-CM | POA: Diagnosis not present

## 2018-09-09 DIAGNOSIS — J9611 Chronic respiratory failure with hypoxia: Secondary | ICD-10-CM

## 2018-09-09 DIAGNOSIS — I2781 Cor pulmonale (chronic): Secondary | ICD-10-CM | POA: Diagnosis not present

## 2018-09-09 DIAGNOSIS — S81802S Unspecified open wound, left lower leg, sequela: Secondary | ICD-10-CM

## 2018-09-09 DIAGNOSIS — J439 Emphysema, unspecified: Secondary | ICD-10-CM | POA: Diagnosis not present

## 2018-09-09 DIAGNOSIS — K5901 Slow transit constipation: Secondary | ICD-10-CM | POA: Diagnosis not present

## 2018-09-09 NOTE — Progress Notes (Signed)
Location:  Occupational psychologist of Service:  SNF (31) Provider:   Cindi Carbon, ANP [place] [phone]   [name], DO  Patient Care Team: [name], DO as PCP - General (Geriatric Medicine) Sanda Klein, MD as PCP - Cardiology (Cardiology)  Extended Emergency Contact Information Primary Emergency Contact: [name] of [place] Phone: [phone] Work Phone: [phone] Mobile Phone: [phone] Relation: Daughter Secondary Emergency Contact: Charnee, [place] Work Phone: [phone] Mobile Phone: [phone] Relation: Son  Code Status:  DNR Goals of care: Advanced Directive information Advanced Directives 03/29/2018  Does Patient Have a Medical Advance Directive? Yes  Type of Paramedic of [place];Living will  Does patient want to make changes to medical advance directive? No - Patient declined  Copy of [place] in Chart? Yes - validated most recent copy scanned in chart (See row information)  Would patient like information on creating a medical advance directive? No - Patient declined  Pre-existing out of facility DNR order (yellow form or pink MOST form) -     No chief complaint on file.   HPI:  Pt is a 85 y.o. female seen today for medical management of chronic diseases.   She has a hx of COPD and pulmonary htn. She is on oxygen 2 liters continuously. She reports her DOE is at baseline. Minimal dry cough noted. She is concerned that when she walks to the bathroom and takes her oxygen off she gets short of breath and has chest tightness which resolves when she puts her oxygen back on.  Her weight is stable at 143 lbs over the past month but is on a trend upward over the past year. She is drinking boost and eating regularly. No increased edema in her legs. She is longer using ace wraps. She has a chronic wound to her left lower leg that is painful. She  has been seen by derm and by our wound care nurse. The staff is doing regularly dressing changes. There is no odor or increased redness or swelling. She is now taking norco at night to help with pain to the wound.  She takes miralax twice daily and feels its too much.  Continues to be ambulatory with with a walker.   Past Medical History:  Diagnosis Date  . Breast cancer ([place])    left   . Chronic anticoagulation   . Chronic atrial fibrillation   . Hypertension   . Pelvic fracture ([place]) 05/2016   Past Surgical History:  Procedure Laterality Date  . ABDOMINAL HYSTERECTOMY    . BREAST LUMPECTOMY      Allergies  Allergen Reactions  . Aspirin     Upset stomach  . Codeine Nausea Only    Nausea     Outpatient Encounter Medications as of 09/09/2018  Medication Sig  . allopurinol (ZYLOPRIM) 100 MG tablet Take 100 mg by mouth at bedtime.   . ALPRAZolam (XANAX) 0.5 MG tablet Take 1 tablet (0.5 mg total) by mouth at bedtime.  Marland Kitchen antiseptic oral rinse (BIOTENE) LIQD 15 mLs by Mouth Rinse route as needed for dry mouth.  . benzonatate (TESSALON) 100 MG capsule Take 100 mg by mouth 3 (three) times daily as needed for cough.  . Calcium Carbonate-Vitamin D3 (CALCIUM 600-D) 600-400 MG-UNIT TABS Take 1 tablet by mouth daily.  Marland Kitchen dextromethorphan (DELSYM) 30 MG/5ML liquid Take 30 mg by mouth every 12 (twelve) hours as needed for cough.  Marland Kitchen  diclofenac sodium (VOLTAREN) 1 % GEL Apply 2 g topically 4 (four) times daily. Right shoulder  . feeding supplement (BOOST HIGH PROTEIN) LIQD Take 1 Container by mouth 2 (two) times daily between meals.  Marland Kitchen HYDROcodone-acetaminophen (NORCO/VICODIN) 5-325 MG tablet Take 1 tablet by mouth at bedtime as needed for severe pain (at night).  Marland Kitchen ipratropium (ATROVENT) 0.02 % nebulizer solution Take 2.5 mLs (0.5 mg total) by nebulization 3 (three) times daily.  Marland Kitchen levalbuterol (XOPENEX) 1.25 MG/0.5ML nebulizer solution Take 1.25 mg by nebulization 3 (three) times daily.  Marland Kitchen  loratadine (CLARITIN) 10 MG tablet Take 10 mg by mouth at bedtime.  . polyethylene glycol (MIRALAX / GLYCOLAX) 17 g packet Take 17 g by mouth 2 (two) times daily.  . potassium chloride SA (K-DUR,KLOR-CON) 20 MEQ tablet Take 20 mEq by mouth daily.  . sodium chloride (OCEAN) 0.65 % SOLN nasal spray Place 1 spray into both nostrils as needed for congestion.  . torsemide (DEMADEX) 20 MG tablet Take 40 mg by mouth daily.  . traMADol (ULTRAM) 50 MG tablet Take 0.5 tablets (25 mg total) by mouth 3 (three) times daily. (Patient taking differently: Take 50 mg by mouth every 12 (twelve) hours as needed. )  . warfarin (COUMADIN) 2 MG tablet Take 2-4 mg by mouth daily. Pt takes one tablet daily (2 mg) Mon Wed Fri, 4 mg Tues Thurs Sat Sun  . acetaminophen (TYLENOL) 325 MG tablet Take 650 mg by mouth every 4 (four) hours as needed.  . metoprolol succinate (TOPROL-XL) 25 MG 24 hr tablet Take 1 tablet (25 mg total) by mouth daily. (Patient taking differently: Take 12.5 mg by mouth daily. )  . [DISCONTINUED] fluticasone (FLONASE) 50 MCG/ACT nasal spray Place 2 sprays into both nostrils daily.   No facility-administered encounter medications on file as of 09/09/2018.     Review of Systems  Constitutional: Negative for activity change, appetite change, chills, diaphoresis, fatigue, fever and unexpected weight change.  HENT: Negative for congestion.   Respiratory: Positive for cough and shortness of breath. Negative for wheezing.   Cardiovascular: Negative for chest pain, palpitations and leg swelling.  Gastrointestinal: Negative for abdominal distention, abdominal pain, constipation and diarrhea.  Genitourinary: Negative for difficulty urinating and dysuria.  Musculoskeletal: Positive for gait problem. Negative for arthralgias, back pain, joint swelling and myalgias.  Skin: Positive for wound.  Neurological: Negative for dizziness, tremors, seizures, syncope, facial asymmetry, speech difficulty, weakness,  light-headedness, numbness and headaches.  Psychiatric/Behavioral: Negative for agitation, behavioral problems and confusion.    Immunization History  Administered Date(s) Administered  . Influenza Split 12/07/2014  . Influenza-Unspecified 11/05/2013, 12/02/2016, 12/16/2017  . Pneumococcal Polysaccharide-23 03/02/2012  . Tdap 03/02/2012  . Zoster 03/03/2011   Pertinent  Health Maintenance Due  Topic Date Due  . PNA vac Low Risk Adult (2 of 2 - PCV13) 03/02/2013  . INFLUENZA VACCINE  10/01/2018  . DEXA SCAN  Completed   No flowsheet data found. Functional Status Survey:    There were no vitals filed for this visit. There is no height or weight on file to calculate BMI. Physical Exam Vitals signs and nursing note reviewed.  Constitutional:      General: She is not in acute distress.    Appearance: She is not diaphoretic.  HENT:     Head: Normocephalic and atraumatic.  Neck:     Vascular: No JVD.  Cardiovascular:     Rate and Rhythm: Normal rate. Rhythm irregular.     Heart sounds: No murmur.  Pulmonary:     Effort: Pulmonary effort is normal. No respiratory distress.     Breath sounds: No wheezing.     Comments: Decreased throughout Abdominal:     General: Abdomen is flat. Bowel sounds are normal. There is no distension.     Palpations: Abdomen is soft.  Musculoskeletal:     Right lower leg: No edema.     Left lower leg: No edema.  Skin:    General: Skin is warm and dry.  Neurological:     Mental Status: She is alert and oriented to person, place, and time.  Psychiatric:        Mood and Affect: Mood normal.     Labs reviewed: Recent Labs    11/18/17 1832 11/19/17 0231 03/14/18 1407 03/15/18 0820 03/21/18 0300 04/11/18 04/18/18  NA  --  139 139 135 139 141 144  K  --  3.2* 4.5 4.2 4.3 4.4 4.1  CL  --  96* 96* 97*  --   --   --   CO2  --  _0 --   --   --   GLUCOSE  --  151* 136* 141*  --   --   --   BUN  --  22 45* 51* 37* 45* 44*  CREATININE   --  1.12* 1.23* 1.05* 0.8 0.8 1.0  CALCIUM  --  9.1 9.6 8.9  --   --   --   MG 1.8  --   --   --   --   --   --   PHOS 3.2  --   --   --   --   --   --    Recent Labs    11/18/17 1259 03/15/18 0820  AST 26 31  ALT 16 20  ALKPHOS 108 81  BILITOT 4.9* 1.2  PROT 7.0 6.3*  ALBUMIN 3.7 3.4*   Recent Labs    11/19/17 0231 03/14/18 1407 03/15/18 0820 03/21/18 0300  WBC 15.5* 10.9* 10.5 10.9  NEUTROABS  --  7.9*  --   --   HGB 13.2 15.3* 13.5 14.5  HCT 38.9 47.4* 40.6 43  MCV 107.2* 111.0* 109.1*  --   PLT 150 164 146* 205   Lab Results  Component Value Date   TSH 1.602 11/18/2017   No results found for: HGBA1C No results found for: CHOL, HDL, LDLCALC, LDLDIRECT, TRIG, CHOLHDL  Significant Diagnostic Results in last 30 days:  No results found.  Assessment/Plan 1. Slow transit constipation Reduce miralax to once daily  2. Leg wound, left, sequela Non healing. Followed by derm and wound care. Continue norco at bedtime for pain which seems to help. Likely she has venous insuff which contributes to the non healing nature of the wound but we were not able to obtain an ABI due to pain so we are no certain of her arterial circulation. Her daughter is aware and in agreement with no further intervention due to her debility.  3. Pulmonary emphysema, unspecified emphysema type (HCC) Controlled  Continue atrovent and xopenex tid   4. Chronic respiratory failure with hypoxia (HCC Oxygen continuous at 2 liters due to COPD and cor pulmonale Recommend that she wear oxygen at all times even when she goes to the bathroom  5. Essential hypertension Toprol decreased recently to 12.5 due to low bp. Would not taper further as she is followed by cardiology and has afib.   6. Cor pulmonale, chronic ([place]) Weight  is stable for the past few months but is on a trend upward. She appears euvolemic and is likely gaining weight in skilled care as she is eating more and drinking boost (she enjoys  this).    Family/ staff Communication: resident and I called and talked with her daughter Deedie  Labs/tests ordered:  NA

## 2018-09-22 DIAGNOSIS — I4891 Unspecified atrial fibrillation: Secondary | ICD-10-CM | POA: Diagnosis not present

## 2018-09-22 DIAGNOSIS — Z7901 Long term (current) use of anticoagulants: Secondary | ICD-10-CM | POA: Diagnosis not present

## 2018-09-26 DIAGNOSIS — B351 Tinea unguium: Secondary | ICD-10-CM | POA: Diagnosis not present

## 2018-10-06 DIAGNOSIS — I4891 Unspecified atrial fibrillation: Secondary | ICD-10-CM | POA: Diagnosis not present

## 2018-10-06 DIAGNOSIS — Z7901 Long term (current) use of anticoagulants: Secondary | ICD-10-CM | POA: Diagnosis not present

## 2018-10-06 LAB — POCT INR: INR: 2.9 — AB (ref <=1.1)

## 2018-10-11 ENCOUNTER — Non-Acute Institutional Stay (SKILLED_NURSING_FACILITY): Payer: Medicare Other | Admitting: Internal Medicine

## 2018-10-11 ENCOUNTER — Encounter: Payer: Self-pay | Admitting: Internal Medicine

## 2018-10-11 DIAGNOSIS — I4891 Unspecified atrial fibrillation: Secondary | ICD-10-CM

## 2018-10-11 DIAGNOSIS — D6869 Other thrombophilia: Secondary | ICD-10-CM

## 2018-10-11 DIAGNOSIS — L97909 Non-pressure chronic ulcer of unspecified part of unspecified lower leg with unspecified severity: Secondary | ICD-10-CM | POA: Diagnosis not present

## 2018-10-11 DIAGNOSIS — I4821 Permanent atrial fibrillation: Secondary | ICD-10-CM | POA: Diagnosis not present

## 2018-10-11 DIAGNOSIS — I2781 Cor pulmonale (chronic): Secondary | ICD-10-CM | POA: Diagnosis not present

## 2018-10-11 DIAGNOSIS — J9611 Chronic respiratory failure with hypoxia: Secondary | ICD-10-CM

## 2018-10-11 DIAGNOSIS — N183 Chronic kidney disease, stage 3 unspecified: Secondary | ICD-10-CM

## 2018-10-11 DIAGNOSIS — M79605 Pain in left leg: Secondary | ICD-10-CM | POA: Diagnosis not present

## 2018-10-11 DIAGNOSIS — J432 Centrilobular emphysema: Secondary | ICD-10-CM | POA: Diagnosis not present

## 2018-10-11 NOTE — Progress Notes (Signed)
Patient ID: [name], female   DOB: 1924/11/20, 85 y.o.   MRN: [id number]  Location:  [facility] Room Number: 137 Place of Service:  SNF [phone]) Provider:   [name], DO  Patient Care Team: [name], DO as PCP - General (Geriatric Medicine) Sanda Klein, MD as PCP - Cardiology (Cardiology)  Extended Emergency Contact Information Primary Emergency Contact: [name] of [place] Phone: [phone] Work Phone: [phone] Mobile Phone: [phone] Relation: Daughter Secondary Emergency Contact: [name] Work Phone: [phone] Mobile Phone: [phone] Relation: Son  Code Status:  DNR, needs MOST Goals of care: Advanced Directive information Advanced Directives 03/29/2018  Does Patient Have a Medical Advance Directive? Yes  Type of Paramedic of [place];Living will  Does patient want to make changes to medical advance directive? No - Patient declined  Copy of [place] in Chart? Yes - validated most recent copy scanned in chart (See row information)  Would patient like information on creating a medical advance directive? No - Patient declined  Pre-existing out of facility DNR order (yellow form or pink MOST form) -     Chief Complaint  Patient presents with  . Acute Visit    Increased pain, decreased mobility  . Medical Management of Chronic Issues    routine visit    HPI:  Pt is a 85 y.o. female with known PAD with arterial ulcer, paroxysmal afib on coumadin with INR goal 2-3, PAH, pulmonary emphysema with chronic respiratory failure with hypoxia on O2 at rest, osteoporosis with prior pelvic fractures, CKD3, prior breast cancer, constipation, multiple skin cancers seen today for medical management of chronic diseases and acute concerns about decreased mobility/ambulation due to increasing pain in the left leg where she has her arterial ulcer.     [name] was seen in her room in SNF.  She reports she can barely walk across the room now due to severe pain in her left leg.  It does get better after she's up on it.  Sometimes, she cannot get her leg positioned comfortably either.  She's getting weaker and feels like she's going downhill due to not being able to get around.  Her nurse was wondering if gabapentin might be an option for pain control.  Wound is not healing despite multiple different approaches due to poor circulation and her family has opted for palliative approach due to [name] being a poor candidate for catheter-based procedures with her CKD and also not a candidate for surgery with her O2-dependent COPD.  [name] has gained some weight with 2 boosts per day and ice cream.    She's excited that she got to have a socially distant visit with her daughter in the past few days after a long time not seeing her except virtually or speaking with her by phone.    Bowels are moving with current regimen.  Past Medical History:  Diagnosis Date  . Breast cancer ([place])    left   . Chronic anticoagulation   . Chronic atrial fibrillation   . Hypertension   . Pelvic fracture ([place]) 05/2016   Past Surgical History:  Procedure Laterality Date  . ABDOMINAL HYSTERECTOMY    . BREAST LUMPECTOMY      Allergies  Allergen Reactions  . Aspirin     Upset stomach  . Codeine Nausea Only    Nausea     Outpatient Encounter Medications as of 10/11/2018  Medication Sig  . acetaminophen (TYLENOL)  325 MG tablet Take 650 mg by mouth every 4 (four) hours as needed.  Marland Kitchen allopurinol (ZYLOPRIM) 100 MG tablet Take 100 mg by mouth at bedtime.   . ALPRAZolam (XANAX) 0.5 MG tablet Take 1 tablet (0.5 mg total) by mouth at bedtime.  Marland Kitchen antiseptic oral rinse (BIOTENE) LIQD 15 mLs by Mouth Rinse route as needed for dry mouth.  . benzonatate (TESSALON) 100 MG capsule Take 100 mg by mouth 3 (three) times daily as needed for cough.  . Calcium Carbonate-Vitamin D3  (CALCIUM 600-D) 600-400 MG-UNIT TABS Take 1 tablet by mouth daily.  Marland Kitchen dextromethorphan (DELSYM) 30 MG/5ML liquid Take 30 mg by mouth every 12 (twelve) hours as needed for cough.  . diclofenac sodium (VOLTAREN) 1 % GEL Apply 2 g topically 4 (four) times daily. Right shoulder  . feeding supplement (BOOST HIGH PROTEIN) LIQD Take 1 Container by mouth 2 (two) times daily between meals.  Marland Kitchen HYDROcodone-acetaminophen (NORCO/VICODIN) 5-325 MG tablet Take 1 tablet by mouth at bedtime as needed for severe pain (at night).  Marland Kitchen ipratropium (ATROVENT) 0.02 % nebulizer solution Take 2.5 mLs (0.5 mg total) by nebulization 3 (three) times daily.  Marland Kitchen levalbuterol (XOPENEX) 1.25 MG/0.5ML nebulizer solution Take 1.25 mg by nebulization 3 (three) times daily.  Marland Kitchen loratadine (CLARITIN) 10 MG tablet Take 10 mg by mouth at bedtime.  . metoprolol succinate (TOPROL-XL) 25 MG 24 hr tablet Take 12.5 mg by mouth daily.  . mineral oil-hydrophilic petrolatum (AQUAPHOR) ointment Apply 1 application topically as needed for dry skin.  . polyethylene glycol (MIRALAX / GLYCOLAX) 17 g packet Take 17 g by mouth 2 (two) times daily.  . potassium chloride SA (K-DUR,KLOR-CON) 20 MEQ tablet Take 20 mEq by mouth daily.  . sodium chloride (OCEAN) 0.65 % SOLN nasal spray Place 1 spray into both nostrils as needed for congestion.  . torsemide (DEMADEX) 20 MG tablet Take 40 mg by mouth daily.  . traMADol (ULTRAM) 50 MG tablet Take 50 mg by mouth 2 (two) times daily as needed.  . triamcinolone cream (KENALOG) 0.1 % Apply 1 application topically as needed.  . warfarin (COUMADIN) 2 MG tablet Pt takes one tablet daily (2 mg) Mon Wed Fri, 4 mg Tues Thurs Sat Sun  . [DISCONTINUED] metoprolol succinate (TOPROL-XL) 25 MG 24 hr tablet Take 1 tablet (25 mg total) by mouth daily. (Patient taking differently: Take 12.5 mg by mouth daily. )  . [DISCONTINUED] traMADol (ULTRAM) 50 MG tablet Take 0.5 tablets (25 mg total) by mouth 3 (three) times daily. (Patient  taking differently: Take 50 mg by mouth every 12 (twelve) hours as needed. )   No facility-administered encounter medications on file as of 10/11/2018.     Review of Systems  Constitutional: Positive for activity change and fatigue. Negative for appetite change, chills and fever.  HENT: Negative for congestion and trouble swallowing.   Eyes: Negative for visual disturbance.  Respiratory: Positive for cough, shortness of breath and wheezing.   Cardiovascular: Positive for leg swelling. Negative for chest pain and palpitations.  Gastrointestinal: Positive for constipation. Negative for abdominal pain, diarrhea and nausea.  Genitourinary: Negative for dysuria.  Musculoskeletal: Positive for arthralgias and gait problem.       Right shoulder pain, left leg pain, claudication and some "bee sting like pain" at times  Skin:       See PE  Neurological: Positive for weakness. Negative for dizziness.  Hematological: Bruises/bleeds easily.  Psychiatric/Behavioral: Positive for confusion. Negative for agitation and sleep disturbance. The  patient is not nervous/anxious.        Some short-term memory loss    Immunization History  Administered Date(s) Administered  . Influenza Split 12/07/2014  . Influenza-Unspecified 11/05/2013, 12/02/2016, 12/16/2017  . Pneumococcal Polysaccharide-23 03/02/2012  . Tdap 03/02/2012  . Zoster 03/03/2011   Pertinent  Health Maintenance Due  Topic Date Due  . PNA vac Low Risk Adult (2 of 2 - PCV13) 03/02/2013  . INFLUENZA VACCINE  10/01/2018  . DEXA SCAN  Completed   No flowsheet data found. Functional Status Survey:    Vitals:   10/11/18 1539  BP: 102/70  Pulse: 74  Resp: 18  Temp: (!) 97 F (36.1 C)  TempSrc: Oral  SpO2: 97%  Weight: 149 lb (67.6 kg)  Height: 5' 4" (1.626 m)   Body mass index is 25.58 kg/m. Physical Exam Vitals signs reviewed.  Constitutional:      General: She is not in acute distress.    Appearance: She is not  ill-appearing or toxic-appearing.  HENT:     Head: Normocephalic and atraumatic.  Neck:     Musculoskeletal: Neck supple.  Cardiovascular:     Rate and Rhythm: Rhythm irregular.  Pulmonary:     Effort: Pulmonary effort is normal. No respiratory distress.     Breath sounds: Wheezing present. No rhonchi or rales.     Comments: Wearing her O2 Abdominal:     General: Bowel sounds are normal.     Palpations: Abdomen is soft.     Tenderness: There is no abdominal tenderness. There is no guarding or rebound.  Musculoskeletal:        General: Tenderness present.     Comments: Right shoulder decreased ROM; left lower leg tender  Skin:    Comments: Left shin ulcer remains; dressings present on chest wall over biopsy sites; thin skin with numerous actinic keratoses, solar lentigos   Neurological:     General: No focal deficit present.     Mental Status: She is alert and oriented to person, place, and time. Mental status is at baseline.     Gait: Gait abnormal.     Comments: Uses walker short distances (lately a few feet only)  Psychiatric:        Mood and Affect: Mood normal.     Labs reviewed: Recent Labs    11/18/17 1832 11/19/17 0231 03/14/18 1407 03/15/18 0820 03/21/18 0300 04/11/18 04/18/18  NA  --  139 139 135 139 141 144  K  --  3.2* 4.5 4.2 4.3 4.4 4.1  CL  --  96* 96* 97*  --   --   --   CO2  --  _0 --   --   --   GLUCOSE  --  151* 136* 141*  --   --   --   BUN  --  22 45* 51* 37* 45* 44*  CREATININE  --  1.12* 1.23* 1.05* 0.8 0.8 1.0  CALCIUM  --  9.1 9.6 8.9  --   --   --   MG 1.8  --   --   --   --   --   --   PHOS 3.2  --   --   --   --   --   --    Recent Labs    11/18/17 1259 03/15/18 0820  AST 26 31  ALT 16 20  ALKPHOS 108 81  BILITOT 4.9* 1.2  PROT 7.0 6.3*  ALBUMIN  3.7 3.4*   Recent Labs    11/19/17 0231 03/14/18 1407 03/15/18 0820 03/21/18 0300  WBC 15.5* 10.9* 10.5 10.9  NEUTROABS  --  7.9*  --   --   HGB 13.2 15.3* 13.5 14.5  HCT  38.9 47.4* 40.6 43  MCV 107.2* 111.0* 109.1*  --   PLT 150 164 146* 205   Lab Results  Component Value Date   TSH 1.602 11/18/2017   Assessment/Plan 1. Arterial leg ulcer ([place]) -wound began at skin cancer biopsy or excision site and never healed with her PAD -continue wound care per certified wound nurse -wants to walk more but hurts too much -not a candidate for interventions except in emergency from my perspective  2. Anterior leg pain, left -add gabapentin in am and one one hour before bed (we discussed the timing and she thought it would help the discomfort ease so she could get to sleep) -continues on hydrocodone qhs prn pain which is being used and q4h prn tylenol not to exceed 3000 mg per day including that hydrocodone -has a lot of prns so it makes her med list look longer than what she truly uses consistently  3. Centrilobular emphysema (HCC) -cont oxygen, xopenex nebs; delsym, tessalon perles, claritin all for her coughing  4. Chronic respiratory failure with hypoxia (HCC) -continue continuous O2 therapy (but she does remove it to go to the restroom)  5. CKD (chronic kidney disease), stage III (HCC) -Avoid nephrotoxic agents like nsaids, dose adjust renally excreted meds, hydrate. -limits further evaluation and mgt of her PAD  6. Permanent atrial fibrillation -continues on coumadin anticoagulation with goal INR 2-3, rate controlled with toprol  7. Hypercoagulable state due to atrial fibrillation ([place]) -INR goal 2-3 -last INR 7/9 was 3.1, august not yet abstracted  8.  Cor pulmonale -continues on daily demadex 66m and kcl 242m  Family/ staff Communication: discussed with SNF nurse  Labs/tests ordered:  No new today  Leelan Rajewski L. Flannery Cavallero, D.O. [facility] [address] [place] [address] Phone (Mon-Fri 8am-5pm):  [phone] Call:  [phone] follow prompts after 5pm & weekends Office Phone:   [phone] Fax:  [phone]

## 2018-10-14 ENCOUNTER — Other Ambulatory Visit: Payer: Self-pay | Admitting: Adult Health

## 2018-10-14 DIAGNOSIS — D6869 Other thrombophilia: Secondary | ICD-10-CM | POA: Insufficient documentation

## 2018-10-14 DIAGNOSIS — L97909 Non-pressure chronic ulcer of unspecified part of unspecified lower leg with unspecified severity: Secondary | ICD-10-CM

## 2018-10-14 DIAGNOSIS — I4891 Unspecified atrial fibrillation: Secondary | ICD-10-CM | POA: Insufficient documentation

## 2018-10-14 DIAGNOSIS — M79605 Pain in left leg: Secondary | ICD-10-CM

## 2018-10-14 DIAGNOSIS — S81802D Unspecified open wound, left lower leg, subsequent encounter: Secondary | ICD-10-CM

## 2018-10-14 HISTORY — DX: Non-pressure chronic ulcer of unspecified part of unspecified lower leg with unspecified severity: L97.909

## 2018-10-14 MED ORDER — HYDROCODONE-ACETAMINOPHEN 5-325 MG PO TABS: 1.0000 | ORAL_TABLET | Freq: Every evening | ORAL | 0 refills | Status: DC | PRN

## 2018-10-14 MED ORDER — GABAPENTIN 100 MG PO CAPS: 100.0000 mg | ORAL_CAPSULE | Freq: Two times a day (BID) | ORAL | 3 refills | Status: DC

## 2018-10-18 DIAGNOSIS — Z85828 Personal history of other malignant neoplasm of skin: Secondary | ICD-10-CM | POA: Diagnosis not present

## 2018-10-18 DIAGNOSIS — D485 Neoplasm of uncertain behavior of skin: Secondary | ICD-10-CM | POA: Diagnosis not present

## 2018-10-18 DIAGNOSIS — L814 Other melanin hyperpigmentation: Secondary | ICD-10-CM | POA: Diagnosis not present

## 2018-10-18 DIAGNOSIS — L570 Actinic keratosis: Secondary | ICD-10-CM | POA: Diagnosis not present

## 2018-10-18 DIAGNOSIS — D045 Carcinoma in situ of skin of trunk: Secondary | ICD-10-CM | POA: Diagnosis not present

## 2018-10-20 ENCOUNTER — Other Ambulatory Visit: Payer: Self-pay

## 2018-10-20 DIAGNOSIS — F419 Anxiety disorder, unspecified: Secondary | ICD-10-CM

## 2018-10-21 MED ORDER — ALPRAZOLAM 0.5 MG PO TABS: 0.5000 mg | ORAL_TABLET | Freq: Every day | ORAL | 5 refills | Status: DC

## 2018-10-27 ENCOUNTER — Encounter: Payer: Self-pay | Admitting: Adult Health

## 2018-10-27 ENCOUNTER — Non-Acute Institutional Stay (SKILLED_NURSING_FACILITY): Payer: Medicare Other | Admitting: Adult Health

## 2018-10-27 DIAGNOSIS — I4821 Permanent atrial fibrillation: Secondary | ICD-10-CM | POA: Diagnosis not present

## 2018-10-27 DIAGNOSIS — I4891 Unspecified atrial fibrillation: Secondary | ICD-10-CM | POA: Diagnosis not present

## 2018-10-27 DIAGNOSIS — R635 Abnormal weight gain: Secondary | ICD-10-CM

## 2018-10-27 DIAGNOSIS — I2781 Cor pulmonale (chronic): Secondary | ICD-10-CM

## 2018-10-27 DIAGNOSIS — D649 Anemia, unspecified: Secondary | ICD-10-CM | POA: Diagnosis not present

## 2018-10-27 DIAGNOSIS — I5032 Chronic diastolic (congestive) heart failure: Secondary | ICD-10-CM | POA: Diagnosis not present

## 2018-10-27 DIAGNOSIS — Z7901 Long term (current) use of anticoagulants: Secondary | ICD-10-CM | POA: Diagnosis not present

## 2018-10-27 LAB — PROTIME-INR: Protime: 37 — AB (ref 10.0–13.8)

## 2018-10-27 LAB — BASIC METABOLIC PANEL
BUN: 54 — AB (ref 4–21)
Creatinine: 1.2 — AB (ref 0.5–1.1)
Glucose: 102
Potassium: 4.7 (ref 3.4–5.3)
Sodium: 139 (ref 137–147)

## 2018-10-27 LAB — POCT INR
INR: 3.7 — AB (ref 0.9–1.1)
INR: 3.7 — AB (ref 0.9–1.1)

## 2018-10-27 NOTE — Progress Notes (Signed)
Location:  [facility] of Service:  SNF (31) Provider:  Cindi Carbon, ANP [place] [phone]   [name], DO  Patient Care Team: [name], DO as PCP - General (Geriatric Medicine) Sanda Klein, MD as PCP - Cardiology (Cardiology)  Extended Emergency Contact Information Primary Emergency Contact: [name] of [place] Phone: [phone] Work Phone: [phone] Mobile Phone: [phone] Relation: Daughter Secondary Emergency Contact: Armani, [place] Work Phone: [phone] Mobile Phone: [phone] Relation: Son  Code Status:  DNR Goals of care: Advanced Directive information Advanced Directives 03/29/2018  Does Patient Have a Medical Advance Directive? Yes  Type of Paramedic of [place];Living will  Does patient want to make changes to medical advance directive? No - Patient declined  Copy of [place] in Chart? Yes - validated most recent copy scanned in chart (See row information)  Would patient like information on creating a medical advance directive? No - Patient declined  Pre-existing out of facility DNR order (yellow form or pink MOST form) -     Chief Complaint  Patient presents with  . Acute Visit    swelling and weight gain     HPI:  Pt is a 85 y.o. female seen today for an acute visit for swelling and weight gain. She has gained 12 labs in the past 6 weeks. She reports feeling swollen with abd bloating. She has increased edema in both feet as well. She reports mild increase in DOE which is chronic. No sob at rest. Dry cough that is chronic is still present and unchanged. Denies chest pain.  Wt Readings from Last 3 Encounters:  10/27/18 155 lb (70.3 kg)  10/11/18 149 lb (67.6 kg)  09/09/18 143 lb (64.9 kg)      Past Medical History:  Diagnosis Date  . Breast cancer ([place])    left   . Chronic anticoagulation   . Chronic  atrial fibrillation   . Hypertension   . Pelvic fracture ([place]) 05/2016   Past Surgical History:  Procedure Laterality Date  . ABDOMINAL HYSTERECTOMY    . BREAST LUMPECTOMY      Allergies  Allergen Reactions  . Aspirin     Upset stomach  . Codeine Nausea Only    Nausea     Outpatient Encounter Medications as of 10/27/2018  Medication Sig  . acetaminophen (TYLENOL) 500 MG tablet Take 1,000 mg by mouth 3 (three) times daily.  Marland Kitchen allopurinol (ZYLOPRIM) 100 MG tablet Take 100 mg by mouth at bedtime.   . ALPRAZolam (XANAX) 0.5 MG tablet Take 1 tablet (0.5 mg total) by mouth at bedtime.  Marland Kitchen antiseptic oral rinse (BIOTENE) LIQD 15 mLs by Mouth Rinse route as needed for dry mouth.  . benzonatate (TESSALON) 100 MG capsule Take 100 mg by mouth 3 (three) times daily as needed for cough.  . Calcium Carbonate-Vitamin D3 (CALCIUM 600-D) 600-400 MG-UNIT TABS Take 1 tablet by mouth daily.  Marland Kitchen dextromethorphan (DELSYM) 30 MG/5ML liquid Take 30 mg by mouth every 12 (twelve) hours as needed for cough.  . diclofenac sodium (VOLTAREN) 1 % GEL Apply 2 g topically 4 (four) times daily. Right shoulder  . feeding supplement (BOOST HIGH PROTEIN) LIQD Take 1 Container by mouth daily.   Marland Kitchen gabapentin (NEURONTIN) 100 MG capsule Take 1 capsule (100 mg total) by mouth 2 (two) times daily. In am and one hour before bed  . HYDROcodone-acetaminophen (NORCO/VICODIN) 5-325 MG tablet Take 1  tablet by mouth at bedtime as needed for severe pain (at night).  Marland Kitchen ipratropium (ATROVENT) 0.02 % nebulizer solution Take 2.5 mLs (0.5 mg total) by nebulization 3 (three) times daily.  Marland Kitchen levalbuterol (XOPENEX) 1.25 MG/0.5ML nebulizer solution Take 1.25 mg by nebulization 3 (three) times daily.  Marland Kitchen loratadine (CLARITIN) 10 MG tablet Take 10 mg by mouth at bedtime.  . metoprolol succinate (TOPROL-XL) 25 MG 24 hr tablet Take 12.5 mg by mouth daily.  . mineral oil-hydrophilic petrolatum (AQUAPHOR) ointment Apply 1 application topically as  needed for dry skin.  . polyethylene glycol (MIRALAX / GLYCOLAX) 17 g packet Take 17 g by mouth daily.   . potassium chloride SA (K-DUR,KLOR-CON) 20 MEQ tablet Take 20 mEq by mouth daily.  . sodium chloride (OCEAN) 0.65 % SOLN nasal spray Place 1 spray into both nostrils as needed for congestion.  . torsemide (DEMADEX) 20 MG tablet Take 40 mg by mouth daily.  . traMADol (ULTRAM) 50 MG tablet Take 50 mg by mouth 2 (two) times daily as needed.  . triamcinolone cream (KENALOG) 0.1 % Apply 1 application topically as needed.  . warfarin (COUMADIN) 2 MG tablet Pt takes one tablet daily (2 mg) Mon Wed Fri, 4 mg Tues Thurs Sat Sun  . [DISCONTINUED] acetaminophen (TYLENOL) 325 MG tablet Take 650 mg by mouth every 4 (four) hours as needed.   No facility-administered encounter medications on file as of 10/27/2018.     Review of Systems  Constitutional: Negative for activity change, appetite change, chills, diaphoresis, fatigue, fever and unexpected weight change.  HENT: Negative for congestion.   Respiratory: Positive for cough (chronic dry) and shortness of breath (on exertion ). Negative for wheezing.   Cardiovascular: Positive for leg swelling. Negative for chest pain and palpitations.  Gastrointestinal: Positive for abdominal distention. Negative for abdominal pain, constipation and diarrhea.  Genitourinary: Negative for difficulty urinating and dysuria.  Musculoskeletal: Positive for arthralgias and gait problem. Negative for back pain, joint swelling and myalgias.  Neurological: Negative for dizziness, tremors, seizures, syncope, facial asymmetry, speech difficulty, weakness, light-headedness, numbness and headaches.  Psychiatric/Behavioral: Negative for agitation, behavioral problems and confusion.    Immunization History  Administered Date(s) Administered  . Influenza Split 12/07/2014  . Influenza-Unspecified 11/05/2013, 12/02/2016, 12/16/2017  . Pneumococcal Polysaccharide-23 03/02/2012  .  Tdap 03/02/2012  . Zoster 03/03/2011   Pertinent  Health Maintenance Due  Topic Date Due  . PNA vac Low Risk Adult (2 of 2 - PCV13) 03/02/2013  . INFLUENZA VACCINE  10/01/2018  . DEXA SCAN  Completed   No flowsheet data found. Functional Status Survey:    Vitals:   10/27/18 1632  BP: 91/60  Pulse: 68  Resp: 18  Temp: (!) 97 F (36.1 C)  SpO2: 94%  Weight: 155 lb (70.3 kg)   Body mass index is 26.61 kg/m. Physical Exam Vitals signs and nursing note reviewed.  Constitutional:      General: She is not in acute distress.    Appearance: She is not diaphoretic.  HENT:     Head: Normocephalic and atraumatic.     Nose: Nose normal. No congestion.     Mouth/Throat:     Mouth: Mucous membranes are moist.     Pharynx: Oropharynx is clear. No oropharyngeal exudate.  Neck:     Vascular: No JVD.  Cardiovascular:     Rate and Rhythm: Normal rate. Rhythm irregular.     Heart sounds: No murmur.  Pulmonary:     Effort: Pulmonary effort  is normal. No respiratory distress.     Breath sounds: Rales present. No wheezing.  Abdominal:     General: Bowel sounds are normal. There is distension.     Palpations: Abdomen is soft. There is no mass.     Tenderness: There is no abdominal tenderness. There is no right CVA tenderness, left CVA tenderness or guarding.  Musculoskeletal:     Right lower leg: Edema present.     Left lower leg: Edema present.  Skin:    General: Skin is warm and dry.  Neurological:     Mental Status: She is alert and oriented to person, place, and time.  Psychiatric:        Mood and Affect: Mood normal.     Labs reviewed: Recent Labs    11/18/17 1832 11/19/17 0231 03/14/18 1407 03/15/18 0820 03/21/18 0300 04/11/18 04/18/18  NA  --  139 139 135 139 141 144  K  --  3.2* 4.5 4.2 4.3 4.4 4.1  CL  --  96* 96* 97*  --   --   --   CO2  --  _0 --   --   --   GLUCOSE  --  151* 136* 141*  --   --   --   BUN  --  22 45* 51* 37* 45* 44*  CREATININE  --   1.12* 1.23* 1.05* 0.8 0.8 1.0  CALCIUM  --  9.1 9.6 8.9  --   --   --   MG 1.8  --   --   --   --   --   --   PHOS 3.2  --   --   --   --   --   --    Recent Labs    11/18/17 1259 03/15/18 0820  AST 26 31  ALT 16 20  ALKPHOS 108 81  BILITOT 4.9* 1.2  PROT 7.0 6.3*  ALBUMIN 3.7 3.4*   Recent Labs    11/19/17 0231 03/14/18 1407 03/15/18 0820 03/21/18 0300  WBC 15.5* 10.9* 10.5 10.9  NEUTROABS  --  7.9*  --   --   HGB 13.2 15.3* 13.5 14.5  HCT 38.9 47.4* 40.6 43  MCV 107.2* 111.0* 109.1*  --   PLT 150 164 146* 205   Lab Results  Component Value Date   TSH 1.602 11/18/2017   No results found for: HGBA1C No results found for: CHOL, HDL, LDLCALC, LDLDIRECT, TRIG, CHOLHDL  Significant Diagnostic Results in last 30 days:  No results found.  Assessment/Plan  1. Weight gain Increase torsemide to 40 mg bid x 3 days Apply ace wraps on in the am and off in the pm, gently to the left leg due to sensitivity. May discontinue if resident can not tolerate Continue to monitor weights daily Consider med effected from neurontin if no improvement  2. Cor pulmonale, chronic (HCC) Pulmonary artery pressure 72 06/15/17 with severe tricuspid regurg Likely the reason for #1  3. Permanent atrial fibrillation  On long term anticoagulation with coumadin. INR 3.7, hold x 48 hrs then recheck.    Family/ staff Communication: discussed with resident   Labs/tests ordered:  BMP pending and INR

## 2018-10-28 ENCOUNTER — Non-Acute Institutional Stay (SKILLED_NURSING_FACILITY): Payer: Medicare Other | Admitting: Adult Health

## 2018-10-28 ENCOUNTER — Encounter: Payer: Self-pay | Admitting: Adult Health

## 2018-10-28 DIAGNOSIS — L03113 Cellulitis of right upper limb: Secondary | ICD-10-CM

## 2018-10-28 NOTE — Progress Notes (Signed)
Location:  [facility] of Service:  SNF (31) Provider:   Cindi Carbon, ANP [place] [phone]   [name], DO  Patient Care Team: [name], DO as PCP - General (Geriatric Medicine) Sanda Klein, MD as PCP - Cardiology (Cardiology)  Extended Emergency Contact Information Primary Emergency Contact: [name] of [place] Phone: [phone] Work Phone: [phone] Mobile Phone: [phone] Relation: Daughter Secondary Emergency Contact: Trust, [place] Work Phone: [phone] Mobile Phone: [phone] Relation: Son  Code Status:  DNR Goals of care: Advanced Directive information Advanced Directives 03/29/2018  Does Patient Have a Medical Advance Directive? Yes  Type of Paramedic of [place];Living will  Does patient want to make changes to medical advance directive? No - Patient declined  Copy of [place] in Chart? Yes - validated most recent copy scanned in chart (See row information)  Would patient like information on creating a medical advance directive? No - Patient declined  Pre-existing out of facility DNR order (yellow form or pink MOST form) -     Chief Complaint  Patient presents with  . Acute Visit    right arm pain    HPI:  Pt is a 85 y.o. female seen today for an acute visit for right arm pain.  The pain started this morning. There is associated redness and swelling this morning as well. She reports no fever, chills, nausea, vomiting, etc. She has a wound to the right arm that was questionable skin cancer. She has other documented areas of SCC on her chest, legs, etc that she has opted not to treat. The nurses have been applying aquaphor to the wound on the right arm with dressing changes.    Past Medical History:  Diagnosis Date  . Breast cancer ([place] [facility])    left   . Chronic anticoagulation   . Chronic atrial fibrillation   .  Hypertension   . Pelvic fracture ([place]) 05/2016   Past Surgical History:  Procedure Laterality Date  . ABDOMINAL HYSTERECTOMY    . BREAST LUMPECTOMY      Allergies  Allergen Reactions  . Aspirin     Upset stomach  . Codeine Nausea Only    Nausea     Outpatient Encounter Medications as of 10/28/2018  Medication Sig  . acetaminophen (TYLENOL) 500 MG tablet Take 1,000 mg by mouth 3 (three) times daily.  Marland Kitchen allopurinol (ZYLOPRIM) 100 MG tablet Take 100 mg by mouth at bedtime.   . ALPRAZolam (XANAX) 0.5 MG tablet Take 1 tablet (0.5 mg total) by mouth at bedtime.  Marland Kitchen antiseptic oral rinse (BIOTENE) LIQD 15 mLs by Mouth Rinse route as needed for dry mouth.  . benzonatate (TESSALON) 100 MG capsule Take 100 mg by mouth 3 (three) times daily as needed for cough.  . Calcium Carbonate-Vitamin D3 (CALCIUM 600-D) 600-400 MG-UNIT TABS Take 1 tablet by mouth daily.  Marland Kitchen dextromethorphan (DELSYM) 30 MG/5ML liquid Take 30 mg by mouth every 12 (twelve) hours as needed for cough.  . diclofenac sodium (VOLTAREN) 1 % GEL Apply 2 g topically 4 (four) times daily. Right shoulder  . feeding supplement (BOOST HIGH PROTEIN) LIQD Take 1 Container by mouth daily.   Marland Kitchen gabapentin (NEURONTIN) 100 MG capsule Take 1 capsule (100 mg total) by mouth 2 (two) times daily. In am and one hour before bed  . HYDROcodone-acetaminophen (NORCO/VICODIN) 5-325 MG tablet Take 1 tablet by mouth at bedtime as needed for severe  pain (at night).  Marland Kitchen ipratropium (ATROVENT) 0.02 % nebulizer solution Take 2.5 mLs (0.5 mg total) by nebulization 3 (three) times daily.  Marland Kitchen levalbuterol (XOPENEX) 1.25 MG/0.5ML nebulizer solution Take 1.25 mg by nebulization 3 (three) times daily.  Marland Kitchen loratadine (CLARITIN) 10 MG tablet Take 10 mg by mouth at bedtime.  . metoprolol succinate (TOPROL-XL) 25 MG 24 hr tablet Take 12.5 mg by mouth daily.  . mineral oil-hydrophilic petrolatum (AQUAPHOR) ointment Apply 1 application topically as needed for dry skin.  .  polyethylene glycol (MIRALAX / GLYCOLAX) 17 g packet Take 17 g by mouth daily.   . potassium chloride SA (K-DUR,KLOR-CON) 20 MEQ tablet Take 20 mEq by mouth daily.  . sodium chloride (OCEAN) 0.65 % SOLN nasal spray Place 1 spray into both nostrils as needed for congestion.  . torsemide (DEMADEX) 20 MG tablet Take 40 mg by mouth daily.  . traMADol (ULTRAM) 50 MG tablet Take 50 mg by mouth 2 (two) times daily as needed.  . triamcinolone cream (KENALOG) 0.1 % Apply 1 application topically as needed.  . warfarin (COUMADIN) 2 MG tablet Pt takes one tablet daily (2 mg) Mon Wed Fri, 4 mg Tues Thurs Sat Sun   No facility-administered encounter medications on file as of 10/28/2018.     Review of Systems  Constitutional: Negative for appetite change, chills, diaphoresis, fatigue and fever.  Cardiovascular: Positive for leg swelling.  Musculoskeletal: Positive for arthralgias, gait problem and joint swelling.  Neurological: Negative for weakness and numbness.    Immunization History  Administered Date(s) Administered  . Influenza Split 12/07/2014  . Influenza-Unspecified 11/05/2013, 12/02/2016, 12/16/2017  . Pneumococcal Polysaccharide-23 03/02/2012  . Tdap 03/02/2012  . Zoster 03/03/2011   Pertinent  Health Maintenance Due  Topic Date Due  . PNA vac Low Risk Adult (2 of 2 - PCV13) 03/02/2013  . INFLUENZA VACCINE  10/01/2018  . DEXA SCAN  Completed   No flowsheet data found. Functional Status Survey:    Vitals:   10/28/18 1234  BP: 99/61  Pulse: 75   There is no height or weight on file to calculate BMI. Physical Exam Vitals signs and nursing note reviewed.  Skin:    General: Skin is warm and dry.     Findings: Erythema (with associated swelling noted to the right forearm surrounding the wound to the elbow area. ) present.     Comments: Right fore arm with open area that is 100% pink tissue with some yellow drainage. Associated erythema and swelling. +CMS noted to the right arm.    Neurological:     Mental Status: She is alert.     Labs reviewed: Recent Labs    11/18/17 1832 11/19/17 0231 03/14/18 1407 03/15/18 0820 03/21/18 0300 04/11/18 04/18/18  NA  --  139 139 135 139 141 144  K  --  3.2* 4.5 4.2 4.3 4.4 4.1  CL  --  96* 96* 97*  --   --   --   CO2  --  _0 --   --   --   GLUCOSE  --  151* 136* 141*  --   --   --   BUN  --  22 45* 51* 37* 45* 44*  CREATININE  --  1.12* 1.23* 1.05* 0.8 0.8 1.0  CALCIUM  --  9.1 9.6 8.9  --   --   --   MG 1.8  --   --   --   --   --   --  PHOS 3.2  --   --   --   --   --   --    Recent Labs    11/18/17 1259 03/15/18 0820  AST 26 31  ALT 16 20  ALKPHOS 108 81  BILITOT 4.9* 1.2  PROT 7.0 6.3*  ALBUMIN 3.7 3.4*   Recent Labs    11/19/17 0231 03/14/18 1407 03/15/18 0820 03/21/18 0300  WBC 15.5* 10.9* 10.5 10.9  NEUTROABS  --  7.9*  --   --   HGB 13.2 15.3* 13.5 14.5  HCT 38.9 47.4* 40.6 43  MCV 107.2* 111.0* 109.1*  --   PLT 150 164 146* 205   Lab Results  Component Value Date   TSH 1.602 11/18/2017   No results found for: HGBA1C No results found for: CHOL, HDL, LDLCALC, LDLDIRECT, TRIG, CHOLHDL  Significant Diagnostic Results in last 30 days:  No results found.  Assessment/Plan 1. Right arm cellulitis No signs of systemic toxicity at this time.  Keflex 500 mg oral tid x 10 days Keep arm elevated Mark redness with a marker and monitor for improvement    Family/ staff Communication: resident/nurse  Labs/tests ordered:  INR due tomorrow

## 2018-10-31 ENCOUNTER — Encounter: Payer: Self-pay | Admitting: Adult Health

## 2018-10-31 ENCOUNTER — Non-Acute Institutional Stay (SKILLED_NURSING_FACILITY): Payer: Medicare Other | Admitting: Adult Health

## 2018-10-31 DIAGNOSIS — I4821 Permanent atrial fibrillation: Secondary | ICD-10-CM

## 2018-10-31 DIAGNOSIS — I5032 Chronic diastolic (congestive) heart failure: Secondary | ICD-10-CM | POA: Diagnosis not present

## 2018-10-31 DIAGNOSIS — Z7901 Long term (current) use of anticoagulants: Secondary | ICD-10-CM

## 2018-10-31 DIAGNOSIS — R635 Abnormal weight gain: Secondary | ICD-10-CM

## 2018-10-31 DIAGNOSIS — I2781 Cor pulmonale (chronic): Secondary | ICD-10-CM

## 2018-10-31 DIAGNOSIS — L03113 Cellulitis of right upper limb: Secondary | ICD-10-CM | POA: Diagnosis not present

## 2018-10-31 DIAGNOSIS — R799 Abnormal finding of blood chemistry, unspecified: Secondary | ICD-10-CM

## 2018-10-31 DIAGNOSIS — D649 Anemia, unspecified: Secondary | ICD-10-CM | POA: Diagnosis not present

## 2018-10-31 LAB — BASIC METABOLIC PANEL
BUN: 61 — AB (ref 4–21)
Creatinine: 1.2 — AB (ref 0.5–1.1)
Glucose: 102
Potassium: 4.5 (ref 3.4–5.3)
Sodium: 136 — AB (ref 137–147)

## 2018-10-31 LAB — POCT INR: INR: 1.7 — AB (ref 0.9–1.1)

## 2018-10-31 NOTE — Progress Notes (Signed)
Location:  [facility] of Service:  SNF (31) Provider:   Cindi Carbon, ANP [place] [phone]   [name], DO  Patient Care Team: [name], DO as PCP - General (Geriatric Medicine) Sanda Klein, MD as PCP - Cardiology (Cardiology)  Extended Emergency Contact Information Primary Emergency Contact: [name] of [place] Phone: [phone] Work Phone: [phone] Mobile Phone: [phone] Relation: Daughter Secondary Emergency Contact: Shukri, [place] Work Phone: [phone] Mobile Phone: [phone] Relation: Son  Code Status:  DNR Goals of care: Advanced Directive information Advanced Directives 03/29/2018  Does Patient Have a Medical Advance Directive? Yes  Type of Paramedic of [place];Living will  Does patient want to make changes to medical advance directive? No - Patient declined  Copy of [place] in Chart? Yes - validated most recent copy scanned in chart (See row information)  Would patient like information on creating a medical advance directive? No - Patient declined  Pre-existing out of facility DNR order (yellow form or pink MOST form) -     Chief Complaint  Patient presents with  . Acute Visit    continues weight gain and edema    HPI:  Pt is a 85 y.o. female seen today for an acute visit for weight gain and edema. Torsemide was increased to 40 mg bid on 8/27 due to weight gain of 12 lbs in the past 6 weeks with increased edema to the arms,legs, abd, and face.  However, she has continued to gain weight,  3 lb more over the past 3 days.  Ace wraps were ordered to her legs but were not done over the weekend. She has mild sob with exertion which is slightly worse than baseline. She feels weaker too and does not feel like ambulating.   Wt Readings from Last 3 Encounters:  10/31/18 158 lb (71.7 kg)  10/27/18 155 lb (70.3 kg)   10/11/18 149 lb (67.6 kg)   Also she was started on Keflex due to right arm cellulitis on 8/28 that started from a skin lesion on the right forearm that opened and became infected. She has multiple skin lesions and has decided not to undergo any surgery. Probable dx scc or bcc.   She continues with pain to her left leg due to a non healing wound. Started on neurontin on 8/13 which initially helped some but now she does not feel that it is helping and she continues to use norco and ultram as needed for pain.   AFib: INR 1.7 10/31/2018 currently coumadin 2 mg qd, originally she was on 28m alternating with 4 mg but the INR increased to 3.7 and was placed on hold for two days and then restarted at a lower dose on 8/29  Past Medical History:  Diagnosis Date  . Breast cancer ([place]    left   . Chronic anticoagulation   . Chronic atrial fibrillation   . Hypertension   . Pelvic fracture ([facility] 05/[place]   Past Surgical History:  Procedure Laterality Date  . ABDOMINAL HYSTERECTOMY    . BREAST LUMPECTOMY      Allergies  Allergen Reactions  . Aspirin     Upset stomach  . Codeine Nausea Only    Nausea     Outpatient Encounter Medications as of 10/31/2018  Medication Sig  . cephALEXin (KEFLEX) 500 MG capsule Take 500 mg by mouth 3 (three) times daily.  .Marland Kitchenacetaminophen (TYLENOL) 500 MG  tablet Take 1,000 mg by mouth 3 (three) times daily.  Marland Kitchen allopurinol (ZYLOPRIM) 100 MG tablet Take 100 mg by mouth at bedtime.   . ALPRAZolam (XANAX) 0.5 MG tablet Take 1 tablet (0.5 mg total) by mouth at bedtime.  Marland Kitchen antiseptic oral rinse (BIOTENE) LIQD 15 mLs by Mouth Rinse route as needed for dry mouth.  . benzonatate (TESSALON) 100 MG capsule Take 100 mg by mouth 3 (three) times daily as needed for cough.  . Calcium Carbonate-Vitamin D3 (CALCIUM 600-D) 600-400 MG-UNIT TABS Take 1 tablet by mouth daily.  Marland Kitchen dextromethorphan (DELSYM) 30 MG/5ML liquid Take 30 mg by mouth every 12 (twelve) hours as needed for cough.   . diclofenac sodium (VOLTAREN) 1 % GEL Apply 2 g topically 4 (four) times daily. Right shoulder  . feeding supplement (BOOST HIGH PROTEIN) LIQD Take 1 Container by mouth daily.   Marland Kitchen HYDROcodone-acetaminophen (NORCO/VICODIN) 5-325 MG tablet Take 1 tablet by mouth at bedtime as needed for severe pain (at night).  Marland Kitchen ipratropium (ATROVENT) 0.02 % nebulizer solution Take 2.5 mLs (0.5 mg total) by nebulization 3 (three) times daily.  Marland Kitchen levalbuterol (XOPENEX) 1.25 MG/0.5ML nebulizer solution Take 1.25 mg by nebulization 3 (three) times daily.  Marland Kitchen loratadine (CLARITIN) 10 MG tablet Take 10 mg by mouth at bedtime.  . metoprolol succinate (TOPROL-XL) 25 MG 24 hr tablet Take 12.5 mg by mouth daily.  . mineral oil-hydrophilic petrolatum (AQUAPHOR) ointment Apply 1 application topically as needed for dry skin.  . polyethylene glycol (MIRALAX / GLYCOLAX) 17 g packet Take 17 g by mouth daily.   . potassium chloride SA (K-DUR,KLOR-CON) 20 MEQ tablet Take 20 mEq by mouth daily.  . sodium chloride (OCEAN) 0.65 % SOLN nasal spray Place 1 spray into both nostrils as needed for congestion.  . torsemide (DEMADEX) 20 MG tablet Take 40 mg by mouth daily.  . traMADol (ULTRAM) 50 MG tablet Take 50 mg by mouth 2 (two) times daily as needed.  . triamcinolone cream (KENALOG) 0.1 % Apply 1 application topically as needed.  . warfarin (COUMADIN) 2 MG tablet Pt takes one tablet daily (2 mg) Mon Wed Fri, 4 mg Tues Thurs Sat Sun  . [DISCONTINUED] gabapentin (NEURONTIN) 100 MG capsule Take 1 capsule (100 mg total) by mouth 2 (two) times daily. In am and one hour before bed   No facility-administered encounter medications on file as of 10/31/2018.     Review of Systems  Constitutional: Positive for activity change and unexpected weight change. Negative for appetite change, chills, diaphoresis, fatigue and fever.  HENT: Negative for congestion.   Respiratory: Positive for shortness of breath (on exertion). Negative for cough and  wheezing.   Cardiovascular: Positive for leg swelling. Negative for chest pain and palpitations.  Gastrointestinal: Positive for abdominal distention. Negative for abdominal pain, constipation and diarrhea.  Genitourinary: Negative for difficulty urinating and dysuria.  Musculoskeletal: Positive for arthralgias and gait problem. Negative for back pain, joint swelling and myalgias.  Skin: Positive for wound.  Neurological: Negative for dizziness, tremors, seizures, syncope, facial asymmetry, speech difficulty, weakness, light-headedness, numbness and headaches.  Psychiatric/Behavioral: Negative for agitation, behavioral problems and confusion.    Immunization History  Administered Date(s) Administered  . Influenza Split 12/07/2014  . Influenza-Unspecified 11/05/2013, 12/02/2016, 12/16/2017  . Pneumococcal Polysaccharide-23 03/02/2012  . Tdap 03/02/2012  . Zoster 03/03/2011   Pertinent  Health Maintenance Due  Topic Date Due  . PNA vac Low Risk Adult (2 of 2 - PCV13) 03/02/2013  . INFLUENZA VACCINE  10/01/2018  . DEXA SCAN  Completed   No flowsheet data found. Functional Status Survey:    Vitals:   10/31/18 1153  BP: 118/60  Pulse: 68  Resp: 20  Temp: (!) 97 F (36.1 C)  SpO2: 95%  Weight: 158 lb (71.7 kg)   Body mass index is 27.12 kg/m. Physical Exam Vitals signs and nursing note reviewed.  Constitutional:      General: She is not in acute distress.    Appearance: She is not diaphoretic.  HENT:     Head: Normocephalic and atraumatic.  Neck:     Vascular: No JVD.  Cardiovascular:     Rate and Rhythm: Normal rate. Rhythm irregular.     Heart sounds: No murmur.  Pulmonary:     Effort: Pulmonary effort is normal. No respiratory distress.     Breath sounds: No wheezing.     Comments: Decreased bases, scattered wheeze bilat Abdominal:     General: Bowel sounds are normal. There is distension.     Palpations: Abdomen is soft. There is no mass.     Tenderness: There  is no abdominal tenderness. There is no guarding.  Musculoskeletal:     Right lower leg: Edema present.     Left lower leg: Edema present.  Skin:    General: Skin is warm and dry.     Comments: Right forearm wound: 100% pink tissue, surrounding erythema improved now reduced to surrounding the wound and in the elbow, swelling present. Tenderness present. RLE wound 100% dark pink tissue, no drainage or erythema. Very tender to touch.  Neurological:     Mental Status: She is alert and oriented to person, place, and time.  Psychiatric:        Mood and Affect: Mood normal.     Labs reviewed: Recent Labs    11/18/17 1832 11/19/17 0231 03/14/18 1407 03/15/18 0820 03/21/18 0300 04/11/18 04/18/18  NA  --  139 139 135 139 141 144  K  --  3.2* 4.5 4.2 4.3 4.4 4.1  CL  --  96* 96* 97*  --   --   --   CO2  --  _0 --   --   --   GLUCOSE  --  151* 136* 141*  --   --   --   BUN  --  22 45* 51* 37* 45* 44*  CREATININE  --  1.12* 1.23* 1.05* 0.8 0.8 1.0  CALCIUM  --  9.1 9.6 8.9  --   --   --   MG 1.8  --   --   --   --   --   --   PHOS 3.2  --   --   --   --   --   --    Recent Labs    11/18/17 1259 03/15/18 0820  AST 26 31  ALT 16 20  ALKPHOS 108 81  BILITOT 4.9* 1.2  PROT 7.0 6.3*  ALBUMIN 3.7 3.4*   Recent Labs    11/19/17 0231 03/14/18 1407 03/15/18 0820 03/21/18 0300  WBC 15.5* 10.9* 10.5 10.9  NEUTROABS  --  7.9*  --   --   HGB 13.2 15.3* 13.5 14.5  HCT 38.9 47.4* 40.6 43  MCV 107.2* 111.0* 109.1*  --   PLT 150 164 146* 205   Lab Results  Component Value Date   TSH 1.602 11/18/2017   No results found for: HGBA1C No results found for: CHOL,  HDL, LDLCALC, LDLDIRECT, TRIG, CHOLHDL  Significant Diagnostic Results in last 30 days:  No results found.  Assessment/Plan  1. Weight gain Increase torsemide to 40 mg bid x 5 days Continue daily weights, reduce water intake Staff were encouraged to apply leg wraps as previously written D/C neurontin due to  peripheral edema Consider aldactone if no improvement.  2. Right arm cellulitis Improved  Continue Keflex 500 mg tid to complete 10 day course  3. Cor pulmonale, chronic (HCC) Contributing to #1, see above  4. Permanent atrial fibrillation Rate is controlled Coumadin for CVA risk reduction   5. Long term (current) use of anticoagulants Continue coumadin at 2 mg qd  INR 9/4  6. Elevated BUN ?with normal Creatinine Check CBC and hemoccult stools   Family/ staff Communication: resident and staff Discussed with Dr. Mariea Clonts in epic messaging.    Labs/tests ordered: INR Friday 9/4

## 2018-11-01 ENCOUNTER — Encounter: Payer: Self-pay | Admitting: Internal Medicine

## 2018-11-01 ENCOUNTER — Non-Acute Institutional Stay (SKILLED_NURSING_FACILITY): Payer: Medicare Other | Admitting: Internal Medicine

## 2018-11-01 DIAGNOSIS — L03113 Cellulitis of right upper limb: Secondary | ICD-10-CM | POA: Diagnosis not present

## 2018-11-01 DIAGNOSIS — R799 Abnormal finding of blood chemistry, unspecified: Secondary | ICD-10-CM | POA: Diagnosis not present

## 2018-11-01 DIAGNOSIS — I2781 Cor pulmonale (chronic): Secondary | ICD-10-CM | POA: Diagnosis not present

## 2018-11-01 DIAGNOSIS — I5032 Chronic diastolic (congestive) heart failure: Secondary | ICD-10-CM | POA: Diagnosis not present

## 2018-11-01 DIAGNOSIS — H04123 Dry eye syndrome of bilateral lacrimal glands: Secondary | ICD-10-CM | POA: Diagnosis not present

## 2018-11-01 DIAGNOSIS — L97909 Non-pressure chronic ulcer of unspecified part of unspecified lower leg with unspecified severity: Secondary | ICD-10-CM

## 2018-11-01 DIAGNOSIS — D649 Anemia, unspecified: Secondary | ICD-10-CM | POA: Diagnosis not present

## 2018-11-01 LAB — CBC AND DIFFERENTIAL
HCT: 38 (ref 36–46)
HCT: 38 (ref 36–46)
Hemoglobin: 12.8 (ref 12.0–16.0)
Hemoglobin: 12.8 (ref 12.0–16.0)
Platelets: 167 (ref 150–399)
Platelets: 167 (ref 150–399)
WBC: 5.9
WBC: 5.9

## 2018-11-01 LAB — BASIC METABOLIC PANEL
BUN: 61 — AB (ref 4–21)
Creatinine: 1.2 — AB (ref 0.5–1.1)
Glucose: 102
Potassium: 4.5 (ref 3.4–5.3)
Sodium: 136 — AB (ref 137–147)

## 2018-11-01 NOTE — Progress Notes (Signed)
Patient ID: [name], female   DOB: [birth year], 85 y.o.   MRN: [id number]   Location:  [facility] Room Number: 137 Place of Service:  SNF ([phone]) Provider:  [name], DO  Patient Care Team: [name], DO as PCP - General (Geriatric Medicine) Sanda Klein, MD as PCP - Cardiology (Cardiology)  Extended Emergency Contact Information Primary Emergency Contact: [name] of [place] Phone: [phone] Work Phone: [phone] Mobile Phone: [phone] Relation: Daughter Secondary Emergency Contact: Lesleigh Noe Work Phone: [phone] Mobile Phone: [phone] Relation: Son  Code Status:  DNR Goals of care: Advanced Directive information Advanced Directives 03/29/2018  Does Patient Have a Medical Advance Directive? Yes  Type of Paramedic of [place];Living will  Does patient want to make changes to medical advance directive? No - Patient declined  Copy of [place] in Chart? Yes - validated most recent copy scanned in chart (See row information)  Would patient like information on creating a medical advance directive? No - Patient declined  Pre-existing out of facility DNR order (yellow form or pink MOST form) -     Chief Complaint  Patient presents with  . Acute Visit    HPI:  Pt is a 85 y.o. female with h/o afib, pulmonary artery hypertension/cor pulmonale, PAD with a left shin nonhealing arterial ulcer that began as a skin cancer biopsy site, htn, orthostasis, chronic respiratory failure with hypoxia from emphysema, CKD3 and several areas of skin cancer seen today for an acute visit for anasarca.  NP saw Kennyth Lose yesterday due to this concern and increased her torsemide to 36m po bid for 3 days.  She also asked staff to wrap her swollen extremities in ace wraps as tolerated in the days, off at hs.  Keflex was ordered for her right arm cellulitis.  Gabapentin  was stopped in case it was causing the edema.  Cbc was drawn this am and FOBT x 2 to be done.  NP asked me to check on pt today in follow-up.  Weight had notably trended up though pt's intake of shakes and ice cream had, as well, and we'd felt that was the cause of her weight gain at her last visit rather than fluid--she did gain 3 more lbs since that time 8/25 until yesterday (8/31).  When seen today, there was some slight improvement in her swelling.  She is down just one lb to 157 per nursing though it was not yet in matrix.  She has not noticed respiratory changes to begin with so no improvement.  She has swelling of her toes and her right arm.  The right arm remains warm even through her dressings.  She does not yet note any increased pain since being off gabapentin which was providing some neuropathic pain relief for her left leg wound.    She c/o today of her eyes getting scaly discharge on them after using her nebs.  Nursing switched her already from the mask to the pipe and have her wearing her glasses due to this.  Pt calls it an allergy to the neb but it's not.  She admits her eyes are also dry.  Past Medical History:  Diagnosis Date  . Breast cancer ([place]    left   . Chronic anticoagulation   . Chronic atrial fibrillation   . Hypertension   . [place] [place]   Past Surgical History:  Procedure Laterality Date  . ABDOMINAL HYSTERECTOMY    .  BREAST LUMPECTOMY      Allergies  Allergen Reactions  . Aspirin     Upset stomach  . Codeine Nausea Only    Nausea     Outpatient Encounter Medications as of 11/01/2018  Medication Sig  . acetaminophen (TYLENOL) 500 MG tablet Take 1,000 mg by mouth 3 (three) times daily.  Marland Kitchen allopurinol (ZYLOPRIM) 100 MG tablet Take 100 mg by mouth at bedtime.   . ALPRAZolam (XANAX) 0.5 MG tablet Take 1 tablet (0.5 mg total) by mouth at bedtime.  Marland Kitchen antiseptic oral rinse (BIOTENE) LIQD 15 mLs by Mouth Rinse route as needed for dry mouth.    . benzonatate (TESSALON) 100 MG capsule Take 100 mg by mouth 3 (three) times daily as needed for cough.  . Calcium Carbonate-Vitamin D3 (CALCIUM 600-D) 600-400 MG-UNIT TABS Take 1 tablet by mouth daily.  . cephALEXin (KEFLEX) 500 MG capsule Take 500 mg by mouth 3 (three) times daily.  Marland Kitchen dextromethorphan (DELSYM) 30 MG/5ML liquid Take 30 mg by mouth every 12 (twelve) hours as needed for cough.  . diclofenac sodium (VOLTAREN) 1 % GEL Apply 2 g topically 4 (four) times daily. Right shoulder  . feeding supplement (BOOST HIGH PROTEIN) LIQD Take 1 Container by mouth daily.   Marland Kitchen HYDROcodone-acetaminophen (NORCO/VICODIN) 5-325 MG tablet Take 1 tablet by mouth at bedtime as needed for severe pain (at night).  Marland Kitchen ipratropium (ATROVENT) 0.02 % nebulizer solution Take 2.5 mLs (0.5 mg total) by nebulization 3 (three) times daily.  Marland Kitchen levalbuterol (XOPENEX) 1.25 MG/0.5ML nebulizer solution Take 1.25 mg by nebulization 3 (three) times daily.  Marland Kitchen loratadine (CLARITIN) 10 MG tablet Take 10 mg by mouth at bedtime.  . metoprolol succinate (TOPROL-XL) 25 MG 24 hr tablet Take 12.5 mg by mouth daily.  . mineral oil-hydrophilic petrolatum (AQUAPHOR) ointment Apply 1 application topically as needed for dry skin.  . polyethylene glycol (MIRALAX / GLYCOLAX) 17 g packet Take 17 g by mouth daily.   . potassium chloride SA (K-DUR,KLOR-CON) 20 MEQ tablet Take 20 mEq by mouth daily.  . sodium chloride (OCEAN) 0.65 % SOLN nasal spray Place 1 spray into both nostrils as needed for congestion.  . torsemide (DEMADEX) 20 MG tablet Take 40 mg by mouth daily.  . traMADol (ULTRAM) 50 MG tablet Take 50 mg by mouth 2 (two) times daily as needed.  . triamcinolone cream (KENALOG) 0.1 % Apply 1 application topically as needed.  . warfarin (COUMADIN) 2 MG tablet daily at 6 PM.    No facility-administered encounter medications on file as of 11/01/2018.     Review of Systems  Constitutional: Negative for activity change, appetite change,  chills and fever.  HENT: Positive for ear discharge. Negative for congestion.   Respiratory: Negative for chest tightness and shortness of breath.   Cardiovascular: Positive for leg swelling. Negative for chest pain and palpitations.  Gastrointestinal: Positive for abdominal distention. Negative for abdominal pain and constipation.  Genitourinary: Negative for dysuria.  Musculoskeletal: Positive for arthralgias and gait problem. Negative for back pain.  Skin:       Left anterior shin wound and right arm wound from skin cancers, also has wounds on chest from this  Neurological: Positive for weakness. Negative for dizziness.  Psychiatric/Behavioral: Negative for confusion.       Quite clear today    Immunization History  Administered Date(s) Administered  . Influenza Split 12/07/2014  . Influenza-Unspecified 11/05/2013, 12/02/2016, 12/16/2017  . Pneumococcal Polysaccharide-23 03/02/2012  . Tdap 03/02/2012  . Zoster 03/03/2011  Pertinent  Health Maintenance Due  Topic Date Due  . PNA vac Low Risk Adult (2 of 2 - PCV13) 03/02/2013  . INFLUENZA VACCINE  10/01/2018  . DEXA SCAN  Completed   No flowsheet data found. Functional Status Survey:    Vitals:   11/01/18 0944  BP: (!) 89/60  Pulse: 84  Resp: 16  Temp: (!) 97.1 F (36.2 C)  TempSrc: Oral  SpO2: 95%  Weight: 158 lb (71.7 kg)  Height: 5' 4" (1.626 m)   Body mass index is 27.12 kg/m. Physical Exam Vitals signs and nursing note reviewed.  Constitutional:      General: She is not in acute distress.    Appearance: Normal appearance. She is not toxic-appearing.  HENT:     Head: Normocephalic and atraumatic.  Eyes:     Comments: No currently visible discharge on eyelashes  Cardiovascular:     Rate and Rhythm: Rhythm irregular.  Pulmonary:     Effort: Pulmonary effort is normal.     Breath sounds: No wheezing or rales.     Comments: Best she's sounded on my past few visits, but did just have neb Abdominal:      General: Bowel sounds are normal. There is distension.     Palpations: Abdomen is soft. There is no mass.     Tenderness: There is no abdominal tenderness. There is no guarding or rebound.  Musculoskeletal:        General: Tenderness present.     Right lower leg: Edema present.     Left lower leg: Edema present.     Comments: Right shoulder remains chronically tender with decreased ROM  Skin:    Comments: Ace wraps on both legs and kerlix wrap on right arm  Neurological:     General: No focal deficit present.     Mental Status: She is alert.     Labs reviewed: Recent Labs    11/18/17 1832 11/19/17 0231 03/14/18 1407 03/15/18 0820  04/11/18 04/18/18 10/31/18  NA  --  139 139 135   < > 141 144 136*  K  --  3.2* 4.5 4.2   < > 4.4 4.1 4.5  CL  --  96* 96* 97*  --   --   --   --   CO2  --  _0 --   --   --   --   GLUCOSE  --  151* 136* 141*  --   --   --   --   BUN  --  22 45* 51*   < > 45* 44* 61*  CREATININE  --  1.12* 1.23* 1.05*   < > 0.8 1.0 1.2*  CALCIUM  --  9.1 9.6 8.9  --   --   --   --   MG 1.8  --   --   --   --   --   --   --   PHOS 3.2  --   --   --   --   --   --   --    < > = values in this interval not displayed.   Recent Labs    11/18/17 1259 03/15/18 0820  AST 26 31  ALT 16 20  ALKPHOS 108 81  BILITOT 4.9* 1.2  PROT 7.0 6.3*  ALBUMIN 3.7 3.4*   Recent Labs    11/19/17 0231 03/14/18 1407 03/15/18 0820 03/21/18 0300  WBC 15.5* 10.9* 10.5 10.9  NEUTROABS  --  7.9*  --   --   HGB 13.2 15.3* 13.5 14.5  HCT 38.9 47.4* 40.6 43  MCV 107.2* 111.0* 109.1*  --   PLT 150 164 146* 205   Lab Results  Component Value Date   TSH 1.602 11/18/2017   Assessment/Plan 1. Arterial leg ulcer (HCC) -not healing and derm here has referred to outside derm for assessment though I don't know what can safely be done considering her goals of care and poor healing -cont ace wraps for peripheral edema and current wound care   2. Right arm cellulitis -cont  keflex as ordered, cont wraps over adaptic dressing -monitor erythema, warmth and tenderness and for odor, fever  3. Cor pulmonale, chronic (HCC) -ongoing, completing her second day of torsemide 7m po bid and off gabapentin to see if it was contributing to swelling -down one lb today, still with swollen feet and abdominal distention  -no change in respirations or symptoms otherwise  4. Elevated BUN -concern was for GI bleeding due to this without change in creatinine -hgb has trended down but appears to be related to hemoconcentration with all values dropping from last one in Jan -first of two FOBT negative -monitor  5. Dry eyes -start systane drops to both eyes tid prn dry irritated eyes she notes post nebs  Family/ staff Communication:  Discussed with SNF nurse   Labs/tests ordered:  No new  Darrien Laakso L. Katryna Tschirhart, D.O. [facility] [address] [place] [id number] Phone (Mon-Fri 8am-5pm):  [phone] Call:  [phone]& follow prompts after 5pm & weekends Office Phone:  [phone] Fax:  [phone]

## 2018-11-02 MED ORDER — SYSTANE PRESERVATIVE FREE 0.4-0.3 % OP SOLN: 1.0000 [drp] | Freq: Three times a day (TID) | OPHTHALMIC | 5 refills | Status: AC | PRN

## 2018-11-02 NOTE — Addendum Note (Signed)
Addended by: [name] on: 11/02/2018 09:37 AM   Modules accepted: Orders

## 2018-11-03 ENCOUNTER — Encounter: Payer: Self-pay | Admitting: Adult Health

## 2018-11-03 ENCOUNTER — Encounter: Payer: Self-pay | Admitting: Internal Medicine

## 2018-11-03 ENCOUNTER — Non-Acute Institutional Stay (SKILLED_NURSING_FACILITY): Payer: Medicare Other | Admitting: Adult Health

## 2018-11-03 DIAGNOSIS — Z7189 Other specified counseling: Secondary | ICD-10-CM

## 2018-11-03 DIAGNOSIS — R799 Abnormal finding of blood chemistry, unspecified: Secondary | ICD-10-CM | POA: Diagnosis not present

## 2018-11-03 DIAGNOSIS — M25562 Pain in left knee: Secondary | ICD-10-CM | POA: Diagnosis not present

## 2018-11-03 DIAGNOSIS — R635 Abnormal weight gain: Secondary | ICD-10-CM

## 2018-11-03 DIAGNOSIS — W19XXXA Unspecified fall, initial encounter: Secondary | ICD-10-CM

## 2018-11-03 MED ORDER — TRAMADOL HCL 50 MG PO TABS: 50.0000 mg | ORAL_TABLET | Freq: Two times a day (BID) | ORAL | 2 refills | Status: DC | PRN

## 2018-11-03 NOTE — ACP (Advance Care Planning) (Signed)
I discussed with [name] her declining health. I asked that we fill out a most form and discuss hospitalizations. [name] has multiple health issues that include COPD, Cor pulmonale with volume overload, multiple skin cancers that are non healing, and chronic pain in her legs. She is weaker and requiring more assistance. She is progressively more forgetful. She stated that she was not sure about this issue and she would like to defer to her daughter, [name]. I called her and we discussed this issue. Her daughter stated she would like to avoid hospitalizations. I filled out a most form indicating this and she will be by on Sunday and complete it.

## 2018-11-03 NOTE — Progress Notes (Signed)
Location:  Occupational psychologist of Service:  SNF (31) Provider:  Cindi Carbon, ANP [place] [phone]   [name], DO  Patient Care Team: [name], DO as PCP - General (Geriatric Medicine) Sanda Klein, MD as PCP - Cardiology (Cardiology)  Extended Emergency Contact Information Primary Emergency Contact: [name] of [place] Phone: [phone] Work Phone: [phone] Mobile Phone: [phone] Relation: Daughter Secondary Emergency Contact: Talor, [place] Work Phone: [phone] Mobile Phone: [phone] Relation: Son  Code Status: DNR Goals of care: Advanced Directive information Advanced Directives 03/29/2018  Does Patient Have a Medical Advance Directive? Yes  Type of Paramedic of [place];Living will  Does patient want to make changes to medical advance directive? No - Patient declined  Copy of [place] in Chart? Yes - validated most recent copy scanned in chart (See row information)  Would patient like information on creating a medical advance directive? No - Patient declined  Pre-existing out of facility DNR order (yellow form or pink MOST form) -     Chief Complaint  Patient presents with  . Acute Visit    f/u weight gain    HPI:  Pt is a 85 y.o. female seen today for an acute visit for follow up regarding weight gain.   She has lost 2-3 lbs on torsemide bid dosing.  Denies any sob but has chronic doe.  Continues with edema to both her legs. She can use the compression wraps because she fell and has a large skin tear to her right leg that is painful, along with the ulceration on her right leg that is painful.  Wt Readings from Last 3 Encounters:  11/03/18 156 lb 12.8 oz (71.1 kg)  11/01/18 158 lb (71.7 kg)  10/31/18 158 lb (71.7 kg)   The fall occurred on 9/1 and was mechanical in nature. No LOC and she did not hit her head. She is now  reporting left knee pain and swelling, and states that it hurts to bend the knee. She is able to ambulate with out difficulty but is moving slower.   She is currently being treated with keflex for cellulitis of the right arm and has some improvement in pain and swelling.   Reports dry eyes with matting each morning.  No change in vision or swelling  BUN was elevated with normal cr  Lab Results  Component Value Date   BUN 61 (A) 10/31/2018   Lab Results  Component Value Date   CREATININE 1.2 (A) 10/31/2018   CBC checked with 2 gram drop since Jan. Heme stools neg x 2   Past Medical History:  Diagnosis Date  . Breast cancer ([place])    left   . Chronic anticoagulation   . Chronic atrial fibrillation   . Hypertension   . Pelvic fracture ([place]) 05/2016   Past Surgical History:  Procedure Laterality Date  . ABDOMINAL HYSTERECTOMY    . BREAST LUMPECTOMY      Allergies  Allergen Reactions  . Aspirin     Upset stomach  . Codeine Nausea Only    Nausea     Outpatient Encounter Medications as of 11/03/2018  Medication Sig  . acetaminophen (TYLENOL) 500 MG tablet Take 1,000 mg by mouth 3 (three) times daily.  Marland Kitchen allopurinol (ZYLOPRIM) 100 MG tablet Take 100 mg by mouth at bedtime.   . ALPRAZolam (XANAX) 0.5 MG tablet Take 1 tablet (0.5 mg total) by mouth  at bedtime.  Marland Kitchen antiseptic oral rinse (BIOTENE) LIQD 15 mLs by Mouth Rinse route as needed for dry mouth.  . benzonatate (TESSALON) 100 MG capsule Take 100 mg by mouth 3 (three) times daily as needed for cough.  . Calcium Carbonate-Vitamin D3 (CALCIUM 600-D) 600-400 MG-UNIT TABS Take 1 tablet by mouth daily.  . cephALEXin (KEFLEX) 500 MG capsule Take 500 mg by mouth 3 (three) times daily.  Marland Kitchen dextromethorphan (DELSYM) 30 MG/5ML liquid Take 30 mg by mouth every 12 (twelve) hours as needed for cough.  . diclofenac sodium (VOLTAREN) 1 % GEL Apply 2 g topically 4 (four) times daily. Right shoulder  . feeding supplement (BOOST HIGH  PROTEIN) LIQD Take 1 Container by mouth daily.   Marland Kitchen HYDROcodone-acetaminophen (NORCO/VICODIN) 5-325 MG tablet Take 1 tablet by mouth at bedtime as needed for severe pain (at night).  Marland Kitchen ipratropium (ATROVENT) 0.02 % nebulizer solution Take 2.5 mLs (0.5 mg total) by nebulization 3 (three) times daily.  Marland Kitchen levalbuterol (XOPENEX) 1.25 MG/0.5ML nebulizer solution Take 1.25 mg by nebulization 3 (three) times daily.  Marland Kitchen loratadine (CLARITIN) 10 MG tablet Take 10 mg by mouth at bedtime.  . metoprolol succinate (TOPROL-XL) 25 MG 24 hr tablet Take 12.5 mg by mouth daily.  . mineral oil-hydrophilic petrolatum (AQUAPHOR) ointment Apply 1 application topically as needed for dry skin.  Vladimir Faster Glyc-Propyl Glyc PF (SYSTANE PRESERVATIVE FREE) 0.4-0.3 % SOLN Apply 1 drop to eye 3 (three) times daily as needed (dry eyes).  . polyethylene glycol (MIRALAX / GLYCOLAX) 17 g packet Take 17 g by mouth daily.   . potassium chloride SA (K-DUR,KLOR-CON) 20 MEQ tablet Take 20 mEq by mouth daily.  . sodium chloride (OCEAN) 0.65 % SOLN nasal spray Place 1 spray into both nostrils as needed for congestion.  . torsemide (DEMADEX) 20 MG tablet Take 40 mg by mouth 2 (two) times daily.   . traMADol (ULTRAM) 50 MG tablet Take 50 mg by mouth 2 (two) times daily as needed.  . triamcinolone cream (KENALOG) 0.1 % Apply 1 application topically as needed.  . warfarin (COUMADIN) 2 MG tablet daily at 6 PM.    No facility-administered encounter medications on file as of 11/03/2018.     Review of Systems  Constitutional: Positive for activity change and unexpected weight change. Negative for appetite change, chills, diaphoresis, fatigue and fever.  HENT: Negative for congestion.   Respiratory: Negative for cough, shortness of breath and wheezing.        DOE  Cardiovascular: Positive for leg swelling. Negative for chest pain and palpitations.  Gastrointestinal: Positive for abdominal distention. Negative for abdominal pain, anal bleeding,  blood in stool, constipation, diarrhea, nausea and rectal pain.  Genitourinary: Negative for difficulty urinating and dysuria.  Musculoskeletal: Positive for gait problem. Negative for arthralgias, back pain, joint swelling and myalgias.  Skin: Positive for wound.  Neurological: Negative for dizziness, tremors, seizures, syncope, facial asymmetry, speech difficulty, weakness, light-headedness, numbness and headaches.  Psychiatric/Behavioral: Negative for agitation, behavioral problems and confusion.       Progressive short term memory loss    Immunization History  Administered Date(s) Administered  . Influenza Split 12/07/2014  . Influenza-Unspecified 11/05/2013, 12/02/2016, 12/16/2017  . Pneumococcal Polysaccharide-23 03/02/2012  . Tdap 03/02/2012  . Zoster 03/03/2011   Pertinent  Health Maintenance Due  Topic Date Due  . PNA vac Low Risk Adult (2 of 2 - PCV13) 03/02/2013  . INFLUENZA VACCINE  10/01/2018  . DEXA SCAN  Completed   No flowsheet  data found. Functional Status Survey:    Vitals:   11/03/18 1008  BP: 100/68  Pulse: 67  Resp: 16  Temp: (!) 97 F (36.1 C)  SpO2: 95%  Weight: 156 lb 12.8 oz (71.1 kg)   Body mass index is 26.91 kg/m. Physical Exam Vitals signs and nursing note reviewed.  Constitutional:      General: She is not in acute distress.    Appearance: She is not diaphoretic.  HENT:     Head: Normocephalic and atraumatic.  Eyes:     Conjunctiva/sclera: Conjunctivae normal.     Pupils: Pupils are equal, round, and reactive to light.  Neck:     Vascular: No JVD.  Cardiovascular:     Rate and Rhythm: Normal rate. Rhythm irregular.     Heart sounds: No murmur.  Pulmonary:     Effort: Pulmonary effort is normal. No respiratory distress.     Breath sounds: No wheezing.     Comments: Decreased bases Abdominal:     General: Bowel sounds are normal. There is distension.     Palpations: Abdomen is soft. There is no mass.     Tenderness: There is no  abdominal tenderness. There is no guarding or rebound.     Hernia: No hernia is present.  Musculoskeletal:        General: Swelling (left knee with mild ecchymoes. no ballotment. pain with bending and limited rom) present. No deformity.     Right lower leg: Edema present.     Left lower leg: Edema present.  Skin:    General: Skin is warm and dry.     Findings: Erythema (right forearm and elbow improving ) present.  Neurological:     General: No focal deficit present.     Mental Status: She is alert and oriented to person, place, and time. Mental status is at baseline.  Psychiatric:        Mood and Affect: Mood normal.     Labs reviewed: Recent Labs    11/18/17 1832 11/19/17 0231 03/14/18 1407 03/15/18 0820  04/11/18 04/18/18 10/31/18  NA  --  139 139 135   < > 141 144 136*  K  --  3.2* 4.5 4.2   < > 4.4 4.1 4.5  CL  --  96* 96* 97*  --   --   --   --   CO2  --  _0 --   --   --   --   GLUCOSE  --  151* 136* 141*  --   --   --   --   BUN  --  22 45* 51*   < > 45* 44* 61*  CREATININE  --  1.12* 1.23* 1.05*   < > 0.8 1.0 1.2*  CALCIUM  --  9.1 9.6 8.9  --   --   --   --   MG 1.8  --   --   --   --   --   --   --   PHOS 3.2  --   --   --   --   --   --   --    < > = values in this interval not displayed.   Recent Labs    11/18/17 1259 03/15/18 0820  AST 26 31  ALT 16 20  ALKPHOS 108 81  BILITOT 4.9* 1.2  PROT 7.0 6.3*  ALBUMIN 3.7 3.4*   Recent Labs    11/19/17  0231 03/14/18 1407 03/15/18 0820 03/21/18 0300 11/01/18  WBC 15.5* 10.9* 10.5 10.9 5.9  NEUTROABS  --  7.9*  --   --   --   HGB 13.2 15.3* 13.5 14.5 12.8  HCT 38.9 47.4* 40.6 43 38  MCV 107.2* 111.0* 109.1*  --   --   PLT 150 164 146* 205 167   Lab Results  Component Value Date   TSH 1.602 11/18/2017   No results found for: HGBA1C No results found for: CHOL, HDL, LDLCALC, LDLDIRECT, TRIG, CHOLHDL  Significant Diagnostic Results in last 30 days:  No results found.  Assessment/Plan 1.  Fall, initial encounter Mechanical in nature.  Fall prec in place, need walker and assistance with all transfers   2. Acute pain of left knee Xray left knee 2 view Ice tid 15 min x 48hrs  3. Weight gain Slight improvement noted off neurontin with increased torsemide dosing, continue torsemide 40 mg bid through 9/7 then return to 40 mg qd on 9/8 Continue daily weights and monitor water intake  Not able to tolerate compression wraps due to skin tear on the right leg and painful ulceration on the left leg  4. Elevated BUN With normal Cr, unclear etiology but remains asymptomatic. Neg for blood in stool.   5. Advanced care planning/discussion I discussed with Ms. Mohamed her declining health. I asked that we fill out a most form and discuss hospitalizations. Ms. Heinle has multiple health issues that include COPD, Cor pulmonale with volume overload, multiple skin cancers that are non healing, and chronic pain in her legs. She is weaker and requiring more assistance. She is progressively more forgetful. She stated that she was not sure about this issue and she would like to defer to her daughter, Donavan Burnet. I called her and we discussed this issue. Her daughter stated she would like to avoid hospitalizations. I filled out a most form indicating this and she will be by on Sunday and complete it.   Family/ staff Communication: discussed with the resident and her daughter Deedee x 20 minutes regarding advanced directives and end of life decision making.   Labs/tests ordered:  CMP and INR ordered 9/4

## 2018-11-04 DIAGNOSIS — Z7901 Long term (current) use of anticoagulants: Secondary | ICD-10-CM | POA: Diagnosis not present

## 2018-11-04 DIAGNOSIS — I129 Hypertensive chronic kidney disease with stage 1 through stage 4 chronic kidney disease, or unspecified chronic kidney disease: Secondary | ICD-10-CM | POA: Diagnosis not present

## 2018-11-04 DIAGNOSIS — M25562 Pain in left knee: Secondary | ICD-10-CM | POA: Diagnosis not present

## 2018-11-04 DIAGNOSIS — I4891 Unspecified atrial fibrillation: Secondary | ICD-10-CM | POA: Diagnosis not present

## 2018-11-04 DIAGNOSIS — I5032 Chronic diastolic (congestive) heart failure: Secondary | ICD-10-CM | POA: Diagnosis not present

## 2018-11-08 ENCOUNTER — Other Ambulatory Visit: Payer: Self-pay | Admitting: Internal Medicine

## 2018-11-08 DIAGNOSIS — L97909 Non-pressure chronic ulcer of unspecified part of unspecified lower leg with unspecified severity: Secondary | ICD-10-CM

## 2018-11-08 MED ORDER — HYDROCODONE-ACETAMINOPHEN 5-325 MG PO TABS: 1.0000 | ORAL_TABLET | Freq: Every day | ORAL | 0 refills | Status: DC | PRN

## 2018-11-08 NOTE — Progress Notes (Signed)
Pt with increased pain now with left leg arterial wound, now with right leg skin tear also.  Has had cellulitis right arm at skin cancer site.  Did not do well with tid tramadol historically and already using tramadol bid prn (takes always) and norco qhs routinely.  Will add a prn norco in daytime and monitor.

## 2018-11-09 DIAGNOSIS — I482 Chronic atrial fibrillation, unspecified: Secondary | ICD-10-CM | POA: Diagnosis not present

## 2018-11-11 ENCOUNTER — Non-Acute Institutional Stay (SKILLED_NURSING_FACILITY): Payer: Medicare Other | Admitting: Adult Health

## 2018-11-11 ENCOUNTER — Encounter: Payer: Self-pay | Admitting: Adult Health

## 2018-11-11 DIAGNOSIS — R635 Abnormal weight gain: Secondary | ICD-10-CM | POA: Diagnosis not present

## 2018-11-11 DIAGNOSIS — L97909 Non-pressure chronic ulcer of unspecified part of unspecified lower leg with unspecified severity: Secondary | ICD-10-CM | POA: Diagnosis not present

## 2018-11-11 MED ORDER — HYDROCODONE-ACETAMINOPHEN 5-325 MG PO TABS: 1.0000 | ORAL_TABLET | Freq: Four times a day (QID) | ORAL | 0 refills | Status: DC | PRN

## 2018-11-11 NOTE — Progress Notes (Signed)
Location:  Occupational psychologist of Service:  SNF (31) Provider:   Cindi Carbon, ANP [name] [phone]   [name], DO  Patient Care Team: [name], DO as PCP - General (Geriatric Medicine) Sanda Klein, MD as PCP - Cardiology (Cardiology)  Extended Emergency Contact Information Primary Emergency Contact: [name] of [place] Phone: [phone] Work Phone: [phone] Mobile Phone: [phone] Relation: Daughter Secondary Emergency Contact: Iras[place], [place] Work Phone: [phone] Mobile Phone: [phone] Relation: Son  Code Status:  DNR Goals of care: Advanced Directive information Advanced Directives 03/29/2018  Does Patient Have a Medical Advance Directive? Yes  Type of Paramedic of [place];Living will  Does patient want to make changes to medical advance directive? No - Patient declined  Copy of [place] in Chart? Yes - validated most recent copy scanned in chart (See row information)  Would patient like information on creating a medical advance directive? No - Patient declined  Pre-existing out of facility DNR order (yellow form or pink MOST form) -     Chief Complaint  Patient presents with  . Acute Visit    f/u weight gain    HPI:  Pt is a 85 y.o. female seen today for an acute visit for f/u regarding weight gain.  She reports her pants are fitting slightly better and her edema in her arms and legs is also slightly better. She remains at 159 lbs after starting aldactone 12.5 mg qd 1 day ago on 11/10/18.  She has had issues with edema and weight gain recently which have been difficult to correct (12 pounds in 6 weeks). She did not respond to increase torsemide dosing or discontinuing the neurontin. She has a hx of Cor Pulmonale and has chronic issues with fluid overload. She remains on 2 liters of oxygen and denies any sob at rest. She has DOE  that is unchanged. No fever.  She reports her pain in her left leg wound and right leg skin tear is worsening. She would like to have the norco more often as it seems to help more.     Past Medical History:  Diagnosis Date  . Breast cancer ([place])    left   . Chronic anticoagulation   . Chronic atrial fibrillation   . Hypertension   . Pelvic fracture ([place] [facility]) 05/2016   Past Surgical History:  Procedure Laterality Date  . ABDOMINAL HYSTERECTOMY    . BREAST LUMPECTOMY      Allergies  Allergen Reactions  . Aspirin     Upset stomach  . Codeine Nausea Only    Nausea     Outpatient Encounter Medications as of 11/11/2018  Medication Sig  . acetaminophen (TYLENOL) 325 MG tablet Take 650 mg by mouth 2 (two) times daily.  Marland Kitchen spironolactone (ALDACTONE) 25 MG tablet Take 12.5 mg by mouth daily.  Marland Kitchen allopurinol (ZYLOPRIM) 100 MG tablet Take 100 mg by mouth at bedtime.   . ALPRAZolam (XANAX) 0.5 MG tablet Take 1 tablet (0.5 mg total) by mouth at bedtime.  Marland Kitchen antiseptic oral rinse (BIOTENE) LIQD 15 mLs by Mouth Rinse route as needed for dry mouth.  . benzonatate (TESSALON) 100 MG capsule Take 100 mg by mouth 3 (three) times daily as needed for cough.  . Calcium Carbonate-Vitamin D3 (CALCIUM 600-D) 600-400 MG-UNIT TABS Take 1 tablet by mouth daily.  Marland Kitchen dextromethorphan (DELSYM) 30 MG/5ML liquid Take 30 mg by mouth every 12 (twelve) hours  as needed for cough.  . diclofenac sodium (VOLTAREN) 1 % GEL Apply 2 g topically 4 (four) times daily. Right shoulder  . feeding supplement (BOOST HIGH PROTEIN) LIQD Take 1 Container by mouth daily.   Marland Kitchen HYDROcodone-acetaminophen (NORCO) 5-325 MG tablet Take 1 tablet by mouth every 6 (six) hours as needed for severe pain.  Marland Kitchen HYDROcodone-acetaminophen (NORCO/VICODIN) 5-325 MG tablet Take 1 tablet by mouth at bedtime as needed for severe pain (at night).  Marland Kitchen ipratropium (ATROVENT) 0.02 % nebulizer solution Take 2.5 mLs (0.5 mg total) by nebulization 3 (three) times  daily.  Marland Kitchen levalbuterol (XOPENEX) 1.25 MG/0.5ML nebulizer solution Take 1.25 mg by nebulization 3 (three) times daily.  Marland Kitchen loratadine (CLARITIN) 10 MG tablet Take 10 mg by mouth at bedtime.  . metoprolol succinate (TOPROL-XL) 25 MG 24 hr tablet Take 12.5 mg by mouth daily.  . mineral oil-hydrophilic petrolatum (AQUAPHOR) ointment Apply 1 application topically as needed for dry skin.  Vladimir Faster Glyc-Propyl Glyc PF (SYSTANE PRESERVATIVE FREE) 0.4-0.3 % SOLN Apply 1 drop to eye 3 (three) times daily as needed (dry eyes).  . polyethylene glycol (MIRALAX / GLYCOLAX) 17 g packet Take 17 g by mouth daily.   . potassium chloride SA (K-DUR,KLOR-CON) 20 MEQ tablet Take 20 mEq by mouth daily.  . sodium chloride (OCEAN) 0.65 % SOLN nasal spray Place 1 spray into both nostrils as needed for congestion.  . torsemide (DEMADEX) 20 MG tablet Take 40 mg by mouth daily.   Marland Kitchen triamcinolone cream (KENALOG) 0.1 % Apply 1 application topically as needed.  . warfarin (COUMADIN) 2 MG tablet 2 mg every other day. 2 mg Tues, Thurs, Sat, Sun.  28m Mon Wed Fri  . [DISCONTINUED] acetaminophen (TYLENOL) 500 MG tablet Take 1,000 mg by mouth 3 (three) times daily.  . [DISCONTINUED] HYDROcodone-acetaminophen (NORCO) 5-325 MG tablet Take 1 tablet by mouth daily as needed for severe pain. (Patient taking differently: Take 1 tablet by mouth every 6 (six) hours as needed for severe pain. )  . [DISCONTINUED] traMADol (ULTRAM) 50 MG tablet Take 1 tablet (50 mg total) by mouth 2 (two) times daily as needed.   No facility-administered encounter medications on file as of 11/11/2018.     Review of Systems  Constitutional: Positive for unexpected weight change. Negative for activity change, appetite change, chills, diaphoresis, fatigue and fever.  HENT: Negative for congestion.   Respiratory: Positive for shortness of breath (with exertion). Negative for cough and wheezing.   Cardiovascular: Positive for leg swelling. Negative for chest  pain and palpitations.  Gastrointestinal: Negative for abdominal distention, abdominal pain, constipation and diarrhea.  Genitourinary: Negative for difficulty urinating and dysuria.  Musculoskeletal: Positive for arthralgias and gait problem. Negative for back pain, joint swelling and myalgias.  Skin: Positive for wound.  Neurological: Negative for dizziness, tremors, seizures, syncope, facial asymmetry, speech difficulty, weakness, light-headedness, numbness and headaches.  Psychiatric/Behavioral: Negative for agitation, behavioral problems and confusion.    Immunization History  Administered Date(s) Administered  . Influenza Split 12/07/2014  . Influenza-Unspecified 11/05/2013, 12/02/2016, 12/16/2017  . Pneumococcal Polysaccharide-23 03/02/2012  . Tdap 03/02/2012  . Zoster 03/03/2011   Pertinent  Health Maintenance Due  Topic Date Due  . PNA vac Low Risk Adult (2 of 2 - PCV13) 03/02/2013  . INFLUENZA VACCINE  10/01/2018  . DEXA SCAN  Completed   No flowsheet data found. Functional Status Survey:    Vitals:   11/11/18 1334  BP: 115/62  Pulse: 68  Resp: 18  Temp: (Marland Kitchen  97.3 F (36.3 C)  SpO2: 94%  Weight: 159 lb (72.1 kg)   Body mass index is 27.29 kg/m. Physical Exam Vitals signs and nursing note reviewed.  Constitutional:      General: She is not in acute distress.    Appearance: She is not diaphoretic.  HENT:     Head: Normocephalic and atraumatic.  Neck:     Vascular: No JVD.  Cardiovascular:     Rate and Rhythm: Normal rate. Rhythm irregular.     Heart sounds: No murmur.  Pulmonary:     Effort: Pulmonary effort is normal. No respiratory distress.     Breath sounds: Normal breath sounds. No wheezing.  Abdominal:     General: Bowel sounds are normal. There is no distension.     Palpations: Abdomen is soft.  Musculoskeletal:     Right lower leg: Edema present.     Left lower leg: Edema present.  Skin:    General: Skin is warm and dry.  Neurological:      Mental Status: She is alert and oriented to person, place, and time.  Psychiatric:        Mood and Affect: Mood normal.     Labs reviewed: Recent Labs    11/18/17 1832 11/19/17 0231 03/14/18 1407 03/15/18 0820  10/27/18 0600 10/31/18 11/01/18 0600  NA  --  139 139 135   < > 139 136* 136*  K  --  3.2* 4.5 4.2   < > 4.7 4.5 4.5  CL  --  96* 96* 97*  --   --   --   --   CO2  --  _0 --   --   --   --   GLUCOSE  --  151* 136* 141*  --   --   --   --   BUN  --  22 45* 51*   < > 54* 61* 61*  CREATININE  --  1.12* 1.23* 1.05*   < > 1.2* 1.2* 1.2*  CALCIUM  --  9.1 9.6 8.9  --   --   --   --   MG 1.8  --   --   --   --   --   --   --   PHOS 3.2  --   --   --   --   --   --   --    < > = values in this interval not displayed.   Recent Labs    11/18/17 1259 03/15/18 0820  AST 26 31  ALT 16 20  ALKPHOS 108 81  BILITOT 4.9* 1.2  PROT 7.0 6.3*  ALBUMIN 3.7 3.4*   Recent Labs    11/19/17 0231 03/14/18 1407 03/15/18 0820 03/21/18 0300 11/01/18 11/01/18 0600  WBC 15.5* 10.9* 10.5 10.9 5.9 5.9  NEUTROABS  --  7.9*  --   --   --   --   HGB 13.2 15.3* 13.5 14.5 12.8 12.8  HCT 38.9 47.4* 40.6 43 38 38  MCV 107.2* 111.0* 109.1*  --   --   --   PLT 150 164 146* 205 167 167   Lab Results  Component Value Date   TSH 1.602 11/18/2017   No results found for: HGBA1C No results found for: CHOL, HDL, LDLCALC, LDLDIRECT, TRIG, CHOLHDL  Significant Diagnostic Results in last 30 days:  No results found.  Assessment/Plan 1. Arterial leg ulcer (HCC) Worsening pain reported. Will increase prn norco  and continue 1 tab at bedtime. Also change tylenol to 650 mg bid, not to exceed 3 grams. Discontinue ultram.  - HYDROcodone-acetaminophen (NORCO) 5-325 MG tablet; Take 1 tablet by mouth every 6 (six) hours as needed for severe pain.  Dispense: 45 tablet; Refill: 0  2. Weight gain Edema is improving and clothes are fitting better. No change in weight at this time. Continue aldactone  12.5 mg qd and give it more time to work and re eval next week.  Continue torsemide 40 mg qd       Family/ staff Communication: resident and nurse  Labs/tests ordered:  BMP 9/14

## 2018-11-14 DIAGNOSIS — I129 Hypertensive chronic kidney disease with stage 1 through stage 4 chronic kidney disease, or unspecified chronic kidney disease: Secondary | ICD-10-CM | POA: Diagnosis not present

## 2018-11-14 DIAGNOSIS — I5032 Chronic diastolic (congestive) heart failure: Secondary | ICD-10-CM | POA: Diagnosis not present

## 2018-11-14 DIAGNOSIS — D649 Anemia, unspecified: Secondary | ICD-10-CM | POA: Diagnosis not present

## 2018-11-14 LAB — BASIC METABOLIC PANEL
BUN: 49 — AB (ref 4–21)
Creatinine: 1.1 (ref 0.5–1.1)
Glucose: 98
Potassium: 4 (ref 3.4–5.3)
Sodium: 143 (ref 137–147)

## 2018-11-22 DIAGNOSIS — Z85828 Personal history of other malignant neoplasm of skin: Secondary | ICD-10-CM | POA: Diagnosis not present

## 2018-11-22 DIAGNOSIS — L570 Actinic keratosis: Secondary | ICD-10-CM | POA: Diagnosis not present

## 2018-11-22 DIAGNOSIS — D485 Neoplasm of uncertain behavior of skin: Secondary | ICD-10-CM | POA: Diagnosis not present

## 2018-11-22 DIAGNOSIS — S81809D Unspecified open wound, unspecified lower leg, subsequent encounter: Secondary | ICD-10-CM | POA: Diagnosis not present

## 2018-11-22 DIAGNOSIS — D045 Carcinoma in situ of skin of trunk: Secondary | ICD-10-CM | POA: Diagnosis not present

## 2018-11-22 DIAGNOSIS — L814 Other melanin hyperpigmentation: Secondary | ICD-10-CM | POA: Diagnosis not present

## 2018-11-30 LAB — NOVEL CORONAVIRUS, NAA: SARS-CoV-2, NAA: NOT DETECTED

## 2018-12-07 ENCOUNTER — Other Ambulatory Visit: Payer: Self-pay | Admitting: *Deleted

## 2018-12-07 DIAGNOSIS — I4891 Unspecified atrial fibrillation: Secondary | ICD-10-CM | POA: Diagnosis not present

## 2018-12-07 DIAGNOSIS — Z7901 Long term (current) use of anticoagulants: Secondary | ICD-10-CM | POA: Diagnosis not present

## 2018-12-07 LAB — POCT INR
INR: 5.3 — AB (ref 0.9–1.1)
INR: 5.3 — AB (ref 0.9–1.1)

## 2018-12-07 LAB — PROTIME-INR
Protime: 48.2 — AB (ref 10.0–13.8)
Protime: 48.2 — AB (ref 10.0–13.8)

## 2018-12-08 DIAGNOSIS — Z20828 Contact with and (suspected) exposure to other viral communicable diseases: Secondary | ICD-10-CM | POA: Diagnosis not present

## 2018-12-09 ENCOUNTER — Other Ambulatory Visit: Payer: Self-pay | Admitting: Internal Medicine

## 2018-12-09 DIAGNOSIS — I4821 Permanent atrial fibrillation: Secondary | ICD-10-CM

## 2018-12-09 DIAGNOSIS — I4891 Unspecified atrial fibrillation: Secondary | ICD-10-CM | POA: Diagnosis not present

## 2018-12-09 LAB — POCT INR
INR: 2.9 — AB (ref 0.9–1.1)
INR: 2.9 — AB (ref 0.9–1.1)

## 2018-12-09 LAB — PROTIME-INR
Protime: 30.6 — AB (ref 10.0–13.8)
Protime: 30.6 — AB (ref 10.0–13.8)

## 2018-12-09 MED ORDER — WARFARIN SODIUM 2 MG PO TABS: 2.0000 mg | ORAL_TABLET | Freq: Every day | ORAL | 0 refills | Status: DC

## 2018-12-14 ENCOUNTER — Encounter: Payer: Self-pay | Admitting: *Deleted

## 2018-12-14 DIAGNOSIS — Z9189 Other specified personal risk factors, not elsewhere classified: Secondary | ICD-10-CM | POA: Diagnosis not present

## 2018-12-14 NOTE — Progress Notes (Signed)
[facility] labs

## 2018-12-15 ENCOUNTER — Encounter: Payer: Self-pay | Admitting: Adult Health

## 2018-12-15 ENCOUNTER — Non-Acute Institutional Stay (SKILLED_NURSING_FACILITY): Payer: Medicare Other | Admitting: Adult Health

## 2018-12-15 DIAGNOSIS — J9611 Chronic respiratory failure with hypoxia: Secondary | ICD-10-CM

## 2018-12-15 DIAGNOSIS — I2781 Cor pulmonale (chronic): Secondary | ICD-10-CM | POA: Diagnosis not present

## 2018-12-15 DIAGNOSIS — M1A00X0 Idiopathic chronic gout, unspecified site, without tophus (tophi): Secondary | ICD-10-CM | POA: Diagnosis not present

## 2018-12-15 DIAGNOSIS — L97909 Non-pressure chronic ulcer of unspecified part of unspecified lower leg with unspecified severity: Secondary | ICD-10-CM | POA: Diagnosis not present

## 2018-12-15 DIAGNOSIS — I4821 Permanent atrial fibrillation: Secondary | ICD-10-CM | POA: Diagnosis not present

## 2018-12-15 DIAGNOSIS — J432 Centrilobular emphysema: Secondary | ICD-10-CM

## 2018-12-15 DIAGNOSIS — R058 Other specified cough: Secondary | ICD-10-CM

## 2018-12-15 DIAGNOSIS — M109 Gout, unspecified: Secondary | ICD-10-CM | POA: Insufficient documentation

## 2018-12-15 DIAGNOSIS — R05 Cough: Secondary | ICD-10-CM

## 2018-12-15 NOTE — Progress Notes (Signed)
Location:  Occupational psychologist of Service:  SNF (31) Provider:   Cindi Carbon, ANP [place] [phone]   [name], DO  Patient Care Team: [name], DO as PCP - General (Geriatric Medicine) Sanda Klein, MD as PCP - Cardiology (Cardiology)  Extended Emergency Contact Information Primary Emergency Contact: [name] of [place] Phone: [phone] Work Phone: [phone] Mobile Phone: [phone] Relation: Daughter Secondary Emergency Contact: Adaleigh, [place] Work Phone: [phone] Mobile Phone: [phone] Relation: Son  Code Status:  DNR Goals of care: Advanced Directive information Advanced Directives 03/29/2018  Does Patient Have a Medical Advance Directive? Yes  Type of Paramedic of [place];Living will  Does patient want to make changes to medical advance directive? No - Patient declined  Copy of [place] in Chart? Yes - validated most recent copy scanned in chart (See row information)  Would patient like information on creating a medical advance directive? No - Patient declined  Pre-existing out of facility DNR order (yellow form or pink MOST form) -     Chief Complaint  Patient presents with  . Medical Management of Chronic Issues    HPI:  Pt is a 85 y.o. female seen today for medical management of chronic diseases.   She has chronic edema due to cor pulmonale. Her weight has trended down with aldactone (also off neurontin). She feels lighter and is breathing easier. She reports that her pain is also improved in her left leg. She is using prn norco a few times a week at bedtime and scheduled tyelnol.  Wt Readings from Last 3 Encounters:  12/15/18 144 lb 12.8 oz (65.7 kg)  11/11/18 159 lb (72.1 kg)  11/03/18 156 lb 12.8 oz (71.1 kg)  She has multiple non healing lesions to her chest right leg, left leg, and right arm. She has chosen not to  treat these due to her age and other chronic conditions.   Past Medical History:  Diagnosis Date  . Arterial leg ulcer ([place]) 10/14/2018  . Breast cancer ([place])    left   . Centrilobular emphysema ([place]) 12/23/2017  . Chronic anticoagulation   . Chronic atrial fibrillation ([place])   . Chronic respiratory failure with hypoxia ([place]) 03/26/2018  . CKD (chronic kidney disease), stage III   . Cor pulmonale, chronic ([place]) 06/13/2013   Pulmonary artery pressure 72 06/15/17 with severe tricuspid regurg  . Essential hypertension 06/13/2013  . Hypertension   . PAH (pulmonary artery hypertension) ([place]) 03/25/2017   Pulmonary artery pressure 72 06/15/17 with severe tricuspid regurg  . Pelvic fracture ([place]) 05/2016  . Permanent atrial fibrillation ([place]) 03/26/2018   Past Surgical History:  Procedure Laterality Date  . ABDOMINAL HYSTERECTOMY    . BREAST LUMPECTOMY      Allergies  Allergen Reactions  . Aspirin     Upset stomach  . Codeine Nausea Only    Nausea     Outpatient Encounter Medications as of 12/15/2018  Medication Sig  . acetaminophen (TYLENOL) 325 MG tablet Take 650 mg by mouth 2 (two) times daily.  Marland Kitchen allopurinol (ZYLOPRIM) 100 MG tablet Take 100 mg by mouth at bedtime.   . ALPRAZolam (XANAX) 0.5 MG tablet Take 1 tablet (0.5 mg total) by mouth at bedtime.  Marland Kitchen antiseptic oral rinse (BIOTENE) LIQD 15 mLs by Mouth Rinse route as needed for dry mouth.  . benzonatate (TESSALON) 100 MG capsule Take 100 mg by mouth 3 (three) times  daily as needed for cough.  . Calcium Carbonate-Vitamin D3 (CALCIUM 600-D) 600-400 MG-UNIT TABS Take 1 tablet by mouth daily.  Marland Kitchen dextromethorphan (DELSYM) 30 MG/5ML liquid Take 30 mg by mouth every 12 (twelve) hours as needed for cough.  . diclofenac sodium (VOLTAREN) 1 % GEL Apply 2 g topically 4 (four) times daily. Right shoulder  . feeding supplement (BOOST HIGH PROTEIN) LIQD Take 1 Container by mouth daily.   Marland Kitchen HYDROcodone-acetaminophen (NORCO) 5-325 MG tablet  Take 1 tablet by mouth every 6 (six) hours as needed for severe pain.  Marland Kitchen HYDROcodone-acetaminophen (NORCO/VICODIN) 5-325 MG tablet Take 1 tablet by mouth at bedtime as needed for severe pain (at night).  Marland Kitchen ipratropium (ATROVENT) 0.02 % nebulizer solution Take 2.5 mLs (0.5 mg total) by nebulization 3 (three) times daily.  Marland Kitchen levalbuterol (XOPENEX) 1.25 MG/0.5ML nebulizer solution Take 1.25 mg by nebulization 3 (three) times daily.  Marland Kitchen loratadine (CLARITIN) 10 MG tablet Take 10 mg by mouth at bedtime.  . metoprolol succinate (TOPROL-XL) 25 MG 24 hr tablet Take 12.5 mg by mouth daily.  . mineral oil-hydrophilic petrolatum (AQUAPHOR) ointment Apply 1 application topically as needed for dry skin.  Vladimir Faster Glyc-Propyl Glyc PF (SYSTANE PRESERVATIVE FREE) 0.4-0.3 % SOLN Apply 1 drop to eye 3 (three) times daily as needed (dry eyes). (Patient taking differently: Apply 1 drop to eye daily. And tid prn)  . polyethylene glycol (MIRALAX / GLYCOLAX) 17 g packet Take 17 g by mouth daily.   . potassium chloride SA (K-DUR,KLOR-CON) 20 MEQ tablet Take 20 mEq by mouth daily.  . sodium chloride (OCEAN) 0.65 % SOLN nasal spray Place 1 spray into both nostrils as needed for congestion.  Marland Kitchen spironolactone (ALDACTONE) 25 MG tablet Take 12.5 mg by mouth daily.  Marland Kitchen torsemide (DEMADEX) 20 MG tablet Take 40 mg by mouth daily.   Marland Kitchen triamcinolone cream (KENALOG) 0.1 % Apply 1 application topically as needed.  . warfarin (COUMADIN) 2 MG tablet Take 1 tablet (2 mg total) by mouth daily at 6 PM.   No facility-administered encounter medications on file as of 12/15/2018.     Review of Systems  Constitutional: Negative for activity change, appetite change, chills, diaphoresis, fatigue, fever and unexpected weight change.  HENT: Negative for congestion.   Eyes: Negative for visual disturbance.  Respiratory: Positive for cough (dry chronic) and shortness of breath (on exertion chronic but improved ). Negative for wheezing.    Cardiovascular: Negative for chest pain, palpitations and leg swelling.  Gastrointestinal: Negative for abdominal distention, abdominal pain, constipation and diarrhea.  Endocrine: Negative for cold intolerance and heat intolerance.  Genitourinary: Negative for difficulty urinating and dysuria.  Musculoskeletal: Positive for arthralgias and gait problem. Negative for back pain, joint swelling and myalgias.  Skin: Positive for wound.  Neurological: Negative for dizziness, tremors, seizures, syncope, facial asymmetry, speech difficulty, weakness, light-headedness, numbness and headaches.  Psychiatric/Behavioral: Negative for agitation, behavioral problems and confusion.    Immunization History  Administered Date(s) Administered  . Influenza Split 12/07/2014  . Influenza-Unspecified 11/05/2013, 12/02/2016, 12/16/2017  . Pneumococcal Polysaccharide-23 03/02/2012  . Tdap 03/02/2012  . Zoster 03/03/2011   Pertinent  Health Maintenance Due  Topic Date Due  . PNA vac Low Risk Adult (2 of 2 - PCV13) 03/02/2013  . INFLUENZA VACCINE  10/01/2018  . DEXA SCAN  Completed   No flowsheet data found. Functional Status Survey:    Vitals:   12/15/18 1045  Weight: 144 lb 12.8 oz (65.7 kg)   Body mass index  is 24.85 kg/m. Physical Exam Vitals signs and nursing note reviewed.  Constitutional:      General: She is not in acute distress.    Appearance: She is not diaphoretic.  HENT:     Head: Normocephalic and atraumatic.  Neck:     Vascular: No JVD.  Cardiovascular:     Rate and Rhythm: Normal rate. Rhythm irregular.     Heart sounds: No murmur.  Pulmonary:     Effort: Pulmonary effort is normal. No respiratory distress.     Breath sounds: No wheezing.     Comments: Decreased bases  Abdominal:     General: Bowel sounds are normal. There is no distension.     Palpations: Abdomen is soft.     Tenderness: There is no abdominal tenderness.  Musculoskeletal:     Right lower leg: No edema.      Left lower leg: No edema.  Skin:    General: Skin is warm and dry.     Comments: Probable SCC noted to the chest with mild erythema. Right forearm with probable neoplasm as as right lower leg.  Neurological:     Mental Status: She is alert and oriented to person, place, and time.  Psychiatric:        Mood and Affect: Mood normal.     Labs reviewed: Recent Labs    03/14/18 1407 03/15/18 0820  10/31/18 11/01/18 0600 11/14/18  NA 139 135   < > 136* 136* 143  K 4.5 4.2   < > 4.5 4.5 4.0  CL 96* 97*  --   --   --   --   CO2 27 26  --   --   --   --   GLUCOSE 136* 141*  --   --   --   --   BUN 45* 51*   < > 61* 61* 49*  CREATININE 1.23* 1.05*   < > 1.2* 1.2* 1.1  CALCIUM 9.6 8.9  --   --   --   --    < > = values in this interval not displayed.   Recent Labs    03/15/18 0820  AST 31  ALT 20  ALKPHOS 81  BILITOT 1.2  PROT 6.3*  ALBUMIN 3.4*   Recent Labs    03/14/18 1407 03/15/18 0820 03/21/18 0300 11/01/18 11/01/18 0600  WBC 10.9* 10.5 10.9 5.9 5.9  NEUTROABS 7.9*  --   --   --   --   HGB 15.3* 13.5 14.5 12.8 12.8  HCT 47.4* 40.6 43 38 38  MCV 111.0* 109.1*  --   --   --   PLT 164 146* 205 167 167   Lab Results  Component Value Date   TSH 1.602 11/18/2017   No results found for: HGBA1C No results found for: CHOL, HDL, LDLCALC, LDLDIRECT, TRIG, CHOLHDL  Significant Diagnostic Results in last 30 days:  No results found.  Assessment/Plan 1. Centrilobular emphysema ([place]) Controlled with scheduled xopenex and atrovent.    2. Idiopathic chronic gout without tophus, unspecified site No new issues, continue allopurinol 100 mg qd   3. Chronic respiratory failure with hypoxia (HCC) Continue on 2-3 liters of oxgen due to cor pulomonale and copd.   4. Permanent atrial fibrillation (HCC) Continue coumadin 2 mg qd, INR in am Rate controlled with toprol 12.5 mg qd, bp soft continue to monitor  5. Dry cough Chronic issue with no sputum production or fever.  Covid neg.  Seems to  be related to nasal dryness at night, try saline Saline nasal spray 2 sprays qhs  6. Arterial leg ulcer ([place]) Non healing to left leg, started as a biopsy site due to skin cancer.  Pain is improved.  Discontinue qhs prn norco and continue q 6 hrs prn order  7. Cor pulmonale PAP 72 in 2019 Doing much better on aldactone 12.5 mg qd and Demadex 40 mg qd.  Continue current regimen. Monitor weight and bp daily.     Family/ staff Communication: resident   Labs/tests ordered:  INR 10/16

## 2018-12-16 DIAGNOSIS — I4891 Unspecified atrial fibrillation: Secondary | ICD-10-CM | POA: Diagnosis not present

## 2018-12-16 LAB — POCT INR: INR: 2 — AB (ref <=1.1)

## 2018-12-16 LAB — PROTIME-INR: Protime: 22.6 — AB (ref 10.0–13.8)

## 2018-12-19 DIAGNOSIS — Z9189 Other specified personal risk factors, not elsewhere classified: Secondary | ICD-10-CM | POA: Diagnosis not present

## 2018-12-19 DIAGNOSIS — Z20828 Contact with and (suspected) exposure to other viral communicable diseases: Secondary | ICD-10-CM | POA: Diagnosis not present

## 2018-12-27 ENCOUNTER — Other Ambulatory Visit: Payer: Self-pay | Admitting: Internal Medicine

## 2018-12-27 DIAGNOSIS — L97909 Non-pressure chronic ulcer of unspecified part of unspecified lower leg with unspecified severity: Secondary | ICD-10-CM

## 2018-12-27 DIAGNOSIS — M79605 Pain in left leg: Secondary | ICD-10-CM

## 2018-12-27 DIAGNOSIS — S81802D Unspecified open wound, left lower leg, subsequent encounter: Secondary | ICD-10-CM

## 2018-12-27 DIAGNOSIS — Z9189 Other specified personal risk factors, not elsewhere classified: Secondary | ICD-10-CM | POA: Diagnosis not present

## 2018-12-27 DIAGNOSIS — Z20828 Contact with and (suspected) exposure to other viral communicable diseases: Secondary | ICD-10-CM | POA: Diagnosis not present

## 2018-12-27 MED ORDER — HYDROCODONE-ACETAMINOPHEN 5-325 MG PO TABS: 1.0000 | ORAL_TABLET | Freq: Every evening | ORAL | 0 refills | Status: DC | PRN

## 2018-12-27 MED ORDER — HYDROCODONE-ACETAMINOPHEN 5-325 MG PO TABS: 1.0000 | ORAL_TABLET | Freq: Four times a day (QID) | ORAL | 0 refills | Status: DC | PRN

## 2018-12-30 ENCOUNTER — Encounter: Payer: Self-pay | Admitting: Adult Health

## 2018-12-30 ENCOUNTER — Non-Acute Institutional Stay (SKILLED_NURSING_FACILITY): Payer: Medicare Other | Admitting: Adult Health

## 2018-12-30 DIAGNOSIS — Z7901 Long term (current) use of anticoagulants: Secondary | ICD-10-CM

## 2018-12-30 DIAGNOSIS — I4891 Unspecified atrial fibrillation: Secondary | ICD-10-CM

## 2018-12-30 DIAGNOSIS — D6869 Other thrombophilia: Secondary | ICD-10-CM

## 2018-12-30 LAB — PROTIME-INR: Protime: 23.2 — AB (ref 10.0–13.8)

## 2018-12-30 LAB — POCT INR: INR: 2 — AB (ref 0.9–1.1)

## 2018-12-30 NOTE — Progress Notes (Signed)
Location:  Occupational psychologist of Service:  SNF (31) Provider:   Cindi Carbon, ANP [place] [phone]   [name], DO  Patient Care Team: [name], DO as PCP - General (Geriatric Medicine) Sanda Klein, MD as PCP - Cardiology (Cardiology)  Extended Emergency Contact Information Primary Emergency Contact: [name] of [place] Phone: [phone] Work Phone: [phone] Mobile Phone: [phone] Relation: Daughter Secondary Emergency Contact: Jabrea, [place] Work Phone: [phone] Mobile Phone: [phone] Relation: Son  Code Status:  DNR Goals of care: Advanced Directive information Advanced Directives 03/29/2018  Does Patient Have a Medical Advance Directive? Yes  Type of Paramedic of [place];Living will  Does patient want to make changes to medical advance directive? No - Patient declined  Copy of [place] in Chart? Yes - validated most recent copy scanned in chart (See row information)  Would patient like information on creating a medical advance directive? No - Patient declined  Pre-existing out of facility DNR order (yellow form or pink MOST form) -     Chief Complaint  Patient presents with  . Acute Visit    coumadin management    HPI:  Pt is a 85 y.o. female seen today for coumadin management. She is on therapy due to a hx of afib. She denies any frequent bruising or bleeding episodes. No complaints from the nursing staff.  CHA2DS2 VASc: 6  Lab Results  Component Value Date   CALCIUM 8.9 03/15/2018   PHOS 3.2 11/18/2017   Lab Results  Component Value Date   INR 2.0 (A) 12/30/2018   INR 2.0 (A) 12/16/2018   INR 2.9 (A) 12/09/2018   INR 2.9 (A) 12/09/2018   PROTIME 23.2 (A) 12/30/2018   PROTIME 22.6 (A) 12/16/2018   PROTIME 30.6 (A) 12/09/2018   PROTIME 30.6 (A) 12/09/2018      Past Medical History:  Diagnosis Date  .  Arterial leg ulcer ([place]) 10/14/2018  . Breast cancer ([place])    left   . Centrilobular emphysema ([place]) 12/23/2017  . Chronic anticoagulation   . Chronic atrial fibrillation ([place])   . Chronic respiratory failure with hypoxia ([place]) 03/26/2018  . CKD (chronic kidney disease), stage III   . Cor pulmonale, chronic ([place]) 06/13/2013   Pulmonary artery pressure 72 06/15/17 with severe tricuspid regurg  . Essential hypertension 06/13/2013  . Hypertension   . PAH (pulmonary artery hypertension) ([place]) 03/25/2017   Pulmonary artery pressure 72 06/15/17 with severe tricuspid regurg  . Pelvic fracture ([place]) 05/2016  . Permanent atrial fibrillation ([place]) 03/26/2018   Past Surgical History:  Procedure Laterality Date  . ABDOMINAL HYSTERECTOMY    . BREAST LUMPECTOMY      Allergies  Allergen Reactions  . Aspirin     Upset stomach  . Codeine Nausea Only    Nausea     Outpatient Encounter Medications as of 12/30/2018  Medication Sig  . acetaminophen (TYLENOL) 325 MG tablet Take 650 mg by mouth 2 (two) times daily.  Marland Kitchen allopurinol (ZYLOPRIM) 100 MG tablet Take 100 mg by mouth at bedtime.   . ALPRAZolam (XANAX) 0.5 MG tablet Take 1 tablet (0.5 mg total) by mouth at bedtime.  Marland Kitchen antiseptic oral rinse (BIOTENE) LIQD 15 mLs by Mouth Rinse route as needed for dry mouth.  . benzonatate (TESSALON) 100 MG capsule Take 100 mg by mouth 3 (three) times daily as needed for cough.  . Calcium Carbonate-Vitamin D3 (CALCIUM 600-D)  600-400 MG-UNIT TABS Take 1 tablet by mouth daily.  Marland Kitchen dextromethorphan (DELSYM) 30 MG/5ML liquid Take 30 mg by mouth every 12 (twelve) hours as needed for cough.  . diclofenac sodium (VOLTAREN) 1 % GEL Apply 2 g topically 4 (four) times daily. Right shoulder  . feeding supplement (BOOST HIGH PROTEIN) LIQD Take 1 Container by mouth daily.   Marland Kitchen HYDROcodone-acetaminophen (NORCO) 5-325 MG tablet Take 1 tablet by mouth every 6 (six) hours as needed for severe pain.  Marland Kitchen ipratropium (ATROVENT) 0.02 %  nebulizer solution Take 2.5 mLs (0.5 mg total) by nebulization 3 (three) times daily.  Marland Kitchen levalbuterol (XOPENEX) 1.25 MG/0.5ML nebulizer solution Take 1.25 mg by nebulization 3 (three) times daily.  Marland Kitchen loratadine (CLARITIN) 10 MG tablet Take 10 mg by mouth at bedtime.  . metoprolol succinate (TOPROL-XL) 25 MG 24 hr tablet Take 12.5 mg by mouth daily.  . mineral oil-hydrophilic petrolatum (AQUAPHOR) ointment Apply 1 application topically as needed for dry skin.  Vladimir Faster Glyc-Propyl Glyc PF (SYSTANE PRESERVATIVE FREE) 0.4-0.3 % SOLN Apply 1 drop to eye 3 (three) times daily as needed (dry eyes). (Patient taking differently: Apply 1 drop to eye daily. And tid prn)  . polyethylene glycol (MIRALAX / GLYCOLAX) 17 g packet Take 17 g by mouth daily.   . potassium chloride SA (K-DUR,KLOR-CON) 20 MEQ tablet Take 20 mEq by mouth daily.  . sodium chloride (OCEAN) 0.65 % SOLN nasal spray Place 1 spray into both nostrils as needed for congestion.  Marland Kitchen spironolactone (ALDACTONE) 25 MG tablet Take 12.5 mg by mouth daily.  Marland Kitchen torsemide (DEMADEX) 20 MG tablet Take 40 mg by mouth daily.   Marland Kitchen triamcinolone cream (KENALOG) 0.1 % Apply 1 application topically as needed.  . warfarin (COUMADIN) 2 MG tablet Take 1 tablet (2 mg total) by mouth daily at 6 PM.  . [DISCONTINUED] HYDROcodone-acetaminophen (NORCO/VICODIN) 5-325 MG tablet Take 1 tablet by mouth at bedtime as needed for severe pain (at night).   No facility-administered encounter medications on file as of 12/30/2018.     Review of Systems  Denies excessive bleeding or bruising .  Immunization History  Administered Date(s) Administered  . Influenza Split 12/07/2014  . Influenza, High Dose Seasonal PF 12/15/2018  . Influenza-Unspecified 11/05/2013, 12/02/2016, 12/16/2017  . Pneumococcal Polysaccharide-23 03/02/2012  . Tdap 03/02/2012  . Zoster 03/03/2011   Pertinent  Health Maintenance Due  Topic Date Due  . PNA vac Low Risk Adult (2 of 2 - PCV13)  03/02/2013  . INFLUENZA VACCINE  Completed  . DEXA SCAN  Completed   No flowsheet data found. Functional Status Survey:    There were no vitals filed for this visit. There is no height or weight on file to calculate BMI. Physical Exam Constitutional:      Appearance: Normal appearance.  Neurological:     Mental Status: She is alert.     Labs reviewed: Recent Labs    03/14/18 1407 03/15/18 0820  10/31/18 11/01/18 0600 11/14/18  NA 139 135   < > 136* 136* 143  K 4.5 4.2   < > 4.5 4.5 4.0  CL 96* 97*  --   --   --   --   CO2 27 26  --   --   --   --   GLUCOSE 136* 141*  --   --   --   --   BUN 45* 51*   < > 61* 61* 49*  CREATININE 1.23* 1.05*   < >  1.2* 1.2* 1.1  CALCIUM 9.6 8.9  --   --   --   --    < > = values in this interval not displayed.   Recent Labs    03/15/18 0820  AST 31  ALT 20  ALKPHOS 81  BILITOT 1.2  PROT 6.3*  ALBUMIN 3.4*   Recent Labs    03/14/18 1407 03/15/18 0820 03/21/18 0300 11/01/18 11/01/18 0600  WBC 10.9* 10.5 10.9 5.9 5.9  NEUTROABS 7.9*  --   --   --   --   HGB 15.3* 13.5 14.5 12.8 12.8  HCT 47.4* 40.6 43 38 38  MCV 111.0* 109.1*  --   --   --   PLT 164 146* 205 167 167   Lab Results  Component Value Date   TSH 1.602 11/18/2017   No results found for: HGBA1C No results found for: CHOL, HDL, LDLCALC, LDLDIRECT, TRIG, CHOLHDL  Significant Diagnostic Results in last 30 days:  No results found.  Assessment/Plan 1. Hypercoagulable state due to atrial fibrillation (HCC) Rate is controlled.  Continue coumadin 2 mg qd and recheck in 3 weeks.   2. Current use of long term anticoagulation As above    Family/ staff Communication: resident and staff  Labs/tests ordered:  INR

## 2019-01-02 DIAGNOSIS — Z23 Encounter for immunization: Secondary | ICD-10-CM | POA: Diagnosis not present

## 2019-01-02 DIAGNOSIS — Z9189 Other specified personal risk factors, not elsewhere classified: Secondary | ICD-10-CM | POA: Diagnosis not present

## 2019-01-10 ENCOUNTER — Encounter: Payer: Self-pay | Admitting: Internal Medicine

## 2019-01-10 ENCOUNTER — Non-Acute Institutional Stay (SKILLED_NURSING_FACILITY): Payer: Medicare Other | Admitting: Internal Medicine

## 2019-01-10 DIAGNOSIS — J9611 Chronic respiratory failure with hypoxia: Secondary | ICD-10-CM

## 2019-01-10 DIAGNOSIS — L97909 Non-pressure chronic ulcer of unspecified part of unspecified lower leg with unspecified severity: Secondary | ICD-10-CM

## 2019-01-10 DIAGNOSIS — K5909 Other constipation: Secondary | ICD-10-CM | POA: Diagnosis not present

## 2019-01-10 DIAGNOSIS — Z23 Encounter for immunization: Secondary | ICD-10-CM | POA: Diagnosis not present

## 2019-01-10 DIAGNOSIS — Z853 Personal history of malignant neoplasm of breast: Secondary | ICD-10-CM | POA: Diagnosis not present

## 2019-01-10 DIAGNOSIS — I4821 Permanent atrial fibrillation: Secondary | ICD-10-CM | POA: Diagnosis not present

## 2019-01-10 DIAGNOSIS — J432 Centrilobular emphysema: Secondary | ICD-10-CM | POA: Diagnosis not present

## 2019-01-10 DIAGNOSIS — Z66 Do not resuscitate: Secondary | ICD-10-CM

## 2019-01-10 DIAGNOSIS — I2781 Cor pulmonale (chronic): Secondary | ICD-10-CM

## 2019-01-10 NOTE — Progress Notes (Signed)
Patient ID: [name], female   DOB: October 09, 1924, 85 y.o.   MRN: [id number]  Location:  [place] Room Number: 086-V Place of Service:  SNF [phone]) Provider:  [name], DO  Patient Care Team: [name], DO as PCP - General (Geriatric Medicine) Sanda Klein, MD as PCP - Cardiology (Cardiology)  Extended Emergency Contact Information Primary Emergency Contact: [name] of [place] Phone: [phone] Work Phone: [phone] Mobile Phone: [phone] Relation: Daughter Secondary Emergency Contact: Lesleigh Noe Work Phone: [phone] Mobile Phone: [phone] Relation: Son  Code Status:  DNR  Goals of care: Advanced Directive information Advanced Directives 01/10/2019  Does Patient Have a Medical Advance Directive? Yes  Type of Paramedic of [place];Living will;Out of facility DNR (pink MOST or yellow form)  Does patient want to make changes to medical advance directive? No - Patient declined  Copy of [place] in Chart? Yes - validated most recent copy scanned in chart (See row information)  Would patient like information on creating a medical advance directive? -  Pre-existing out of facility DNR order (yellow form or pink MOST form) Yellow form placed in chart (order not valid for inpatient use)     Chief Complaint  Patient presents with  . Medical Management of Chronic Issues    Routine Visit   . Quality Metric Gaps    Discuss the need for PNA  . Advanced Directive    Reactivate DNR order     HPI:  Pt is a 85 y.o. female seen today for medical management of chronic diseases.  She is doing a lot better recently.  She's starting to walk some as her wounds from biopsies of skin cancers that turned into arterial wounds are finally healing.  The pain in her left leg is much better.  Her right shoulder bothers her more now that she's doing some exercises  OT suggested.  She's using voltaren gel on it.  Nursing asked about getting PT involved to get her walking again as she desires.  She's been off of her feet a long time when being upright made the leg hurt so much and prior to that she was in rehab with bronchitis and pelvic fxs.    She has had no increased orthopnea, PND, DOE, or edema.  Her weights have been stable daily for a long time and staff ask if the frequency can be reduced.  Dry weight is around 143 and that's where she used to be before she had all of the difficulty with CHF.  She is not wheezing.    Her left leg arterial wound is down to being the size of a pinky finger.  Past Medical History:  Diagnosis Date  . 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 Breast cancer ([place])    left   . Centrilobular emphysema ([place]) 12/23/2017  . Chronic anticoagulation   . Chronic atrial fibrillation ([place])   . Chronic respiratory failure with hypoxia ([place]) 03/26/2018  . CKD (chronic kidney disease), stage III   . Cor pulmonale, chronic ([place]) 06/13/2013   Pulmonary artery pressure 72 06/15/17 with severe tricuspid regurg  . Essential hypertension 06/13/2013  . Hypertension   . PAH (pulmonary artery hypertension) ([place]) 03/25/2017   Pulmonary artery pressure 72 06/15/17 with severe tricuspid regurg  . Pelvic fracture ([place]) 05/2016  . Permanent atrial fibrillation ([place]) 03/26/2018   Past Surgical History:  Procedure Laterality Date  . ABDOMINAL HYSTERECTOMY    .  BREAST LUMPECTOMY      Allergies  Allergen Reactions  . Aspirin     Upset stomach  . Codeine Nausea Only    Nausea     Outpatient Encounter Medications as of 01/10/2019  Medication Sig  . acetaminophen (TYLENOL) 325 MG tablet Take 650 mg by mouth 2 (two) times daily.  Marland Kitchen allopurinol (ZYLOPRIM) 100 MG tablet Take 100 mg by mouth at bedtime.   . ALPRAZolam (XANAX) 0.5 MG tablet Take 1 tablet (0.5 mg total) by mouth at bedtime.  . Amino Acids-Protein Hydrolys (FEEDING SUPPLEMENT, PRO-STAT  SUGAR FREE 64,) LIQD 1 fl oz by mouth daily  . antiseptic oral rinse (BIOTENE) LIQD 15 mLs by Mouth Rinse route as needed for dry mouth.  . benzonatate (TESSALON) 100 MG capsule Take 100 mg by mouth 3 (three) times daily as needed for cough.  . Calcium Carbonate-Vitamin D3 (CALCIUM 600-D) 600-400 MG-UNIT TABS Take 1 tablet by mouth daily.  Marland Kitchen dextromethorphan (DELSYM) 30 MG/5ML liquid Take 5 mLs by mouth every 4 (four) hours as needed for cough.   . diclofenac sodium (VOLTAREN) 1 % GEL Apply 2 g topically 4 (four) times daily. Right shoulder  . feeding supplement (BOOST HIGH PROTEIN) LIQD Take 1 Container by mouth daily.   Marland Kitchen HYDROcodone-acetaminophen (NORCO) 5-325 MG tablet Take 1 tablet by mouth every 6 (six) hours as needed for severe pain.  Marland Kitchen ipratropium (ATROVENT) 0.02 % nebulizer solution Take 2.5 mLs (0.5 mg total) by nebulization 3 (three) times daily.  Marland Kitchen levalbuterol (XOPENEX) 1.25 MG/0.5ML nebulizer solution Take 1.25 mg by nebulization 3 (three) times daily.  Marland Kitchen levalbuterol (XOPENEX) 1.25 MG/3ML nebulizer solution Take 1.25 mg by nebulization every 6 (six) hours as needed for wheezing or shortness of breath. In addition to three times daily schedule  . loratadine (CLARITIN) 10 MG tablet Take 10 mg by mouth at bedtime.  . metoprolol succinate (TOPROL-XL) 25 MG 24 hr tablet Take 12.5 mg by mouth daily.  . mineral oil-hydrophilic petrolatum (AQUAPHOR) ointment Apply 1 application topically 2 (two) times daily. Apply to face/chest, and both upper arms  . OXYGEN Level 3 or 3 mL/minute when out of room every shift 1, 2, and 3  . Polyethyl Glyc-Propyl Glyc PF (SYSTANE PRESERVATIVE FREE) 0.4-0.3 % SOLN Apply 1 drop to eye 3 (three) times daily as needed (dry eyes).  . polyethylene glycol (MIRALAX / GLYCOLAX) 17 g packet Take 17 g by mouth daily.   . sodium chloride (OCEAN) 0.65 % SOLN nasal spray Place 1 spray into both nostrils as needed for congestion.  Marland Kitchen spironolactone (ALDACTONE) 25 MG tablet  Take 12.5 mg by mouth daily.  Marland Kitchen torsemide (DEMADEX) 20 MG tablet Take 40 mg by mouth daily.   Marland Kitchen triamcinolone cream (KENALOG) 0.1 % Apply 1 application topically as needed. To right arm for itching  . warfarin (COUMADIN) 2 MG tablet Take 1 tablet (2 mg total) by mouth daily at 6 PM.  . [DISCONTINUED] potassium chloride SA (K-DUR,KLOR-CON) 20 MEQ tablet Take 20 mEq by mouth daily.   No facility-administered encounter medications on file as of 01/10/2019.     Review of Systems  Constitutional: Positive for activity change. Negative for appetite change, chills and fever.  HENT: Negative for congestion and trouble swallowing.   Eyes: Negative for visual disturbance.  Respiratory: Negative for cough, chest tightness, shortness of breath and wheezing.   Cardiovascular: Negative for chest pain, palpitations and leg swelling.  Gastrointestinal: Negative for abdominal pain, constipation, diarrhea, nausea and vomiting.  Constipation is under control  Genitourinary: Negative for dysuria.  Musculoskeletal: Positive for arthralgias and gait problem.  Skin: Negative for color change.  Neurological: Positive for weakness. Negative for dizziness.  Hematological: Bruises/bleeds easily.  Psychiatric/Behavioral: Negative for confusion and sleep disturbance. The patient is not nervous/anxious.        Much clearer off opioids for pain    Immunization History  Administered Date(s) Administered  . Influenza Split 12/07/2014  . Influenza, High Dose Seasonal PF 12/15/2018  . Influenza-Unspecified 11/05/2013, 12/02/2016, 12/16/2017  . Pneumococcal Polysaccharide-23 03/02/2012  . Tdap 03/02/2012  . Zoster 03/03/2011   Pertinent  Health Maintenance Due  Topic Date Due  . PNA vac Low Risk Adult (2 of 2 - PCV13) 03/02/2013  . INFLUENZA VACCINE  Completed  . DEXA SCAN  Completed   No flowsheet data found. Functional Status Survey:    Vitals:   01/10/19 1543  BP: 101/69  Pulse: 88  Resp: 16    Temp: (!) 96.8 F (36 C)  SpO2: 95%  Weight: 143 lb 12.8 oz (65.2 kg)  Height: _0  (1.626 m)   Body mass index is 24.68 kg/m. Physical Exam Vitals signs reviewed.  Constitutional:      General: She is not in acute distress.    Appearance: Normal appearance. She is normal weight. She is not toxic-appearing.  HENT:     Head: Normocephalic and atraumatic.  Eyes:     Pupils: Pupils are equal, round, and reactive to light.  Cardiovascular:     Rate and Rhythm: Rhythm irregular.  Pulmonary:     Effort: Pulmonary effort is normal.     Breath sounds: Normal breath sounds. No wheezing or rales.  Musculoskeletal:        General: Tenderness present.     Comments: Right lateral shoulder, decreased abduction  Skin:    Comments: Pinky-sized arterial ulcer slowly healing on left anterior shin; has dressing over skin cancer on chest wall  Neurological:     General: No focal deficit present.     Mental Status: She is alert and oriented to person, place, and time.     Motor: Weakness present.     Comments: Generalized weakness  Psychiatric:        Mood and Affect: Mood normal.        Behavior: Behavior normal.     Labs reviewed: Recent Labs    03/14/18 1407 03/15/18 0820  10/31/18 11/01/18 0600 11/14/18  NA 139 135   < > 136* 136* 143  K 4.5 4.2   < > 4.5 4.5 4.0  CL 96* 97*  --   --   --   --   CO2 27 26  --   --   --   --   GLUCOSE 136* 141*  --   --   --   --   BUN 45* 51*   < > 61* 61* 49*  CREATININE 1.23* 1.05*   < > 1.2* 1.2* 1.1  CALCIUM 9.6 8.9  --   --   --   --    < > = values in this interval not displayed.   Recent Labs    03/15/18 0820  AST 31  ALT 20  ALKPHOS 81  BILITOT 1.2  PROT 6.3*  ALBUMIN 3.4*   Recent Labs    03/14/18 1407 03/15/18 0820 03/21/18 0300 11/01/18 11/01/18 0600  WBC 10.9* 10.5 10.9 5.9 5.9  NEUTROABS 7.9*  --   --   --   --  HGB 15.3* 13.5 14.5 12.8 12.8  HCT 47.4* 40.6 43 38 38  MCV 111.0* 109.1*  --   --   --   PLT 164  146* 205 167 167   Lab Results  Component Value Date   TSH 1.602 11/18/2017   Assessment/Plan 1. Chronic respiratory failure with hypoxia (HCC) -due to #2 primarily  2. Centrilobular emphysema (HCC) -cont prn O2--does not use all the time--not wearing during visit and resting comfortably -cont xopenex and atrovent, prn delsym for coughing  3. Permanent atrial fibrillation (HCC) -rate controlled with beta blocker, on chronic coumadin with INR goal 2-3 that we manage here at [facility]  4. Arterial leg ulcer ([place]) -almost healed now -pain better -eager to get up and walk and get stronger (and be able to use restroom to urinate in time)  5. History of breast cancer -89 and no longer getting mammograms  6. Cor pulmonale, chronic (HCC) -no current signs of volume overload, on daily torsemide 34m and toprol xl  7. Chronic constipation -doing better off opioids--has hydrocodone available but not needing like she did, tries to eat enough fruits and veggies to keep this under wraps, on daily miralax  8. Do not resuscitate - DNR (Do Not Resuscitate) order reentered in epic as discussed on occasions in the past  9.  Need for prevnar-13:  Ordered  Family/ staff Communication: discussed with snf nurse  Labs/tests ordered:  Decrease weights to weekly and rewrote parameters and ordered PT for walking with walker  Alayzia Pavlock L. Kristain Filo, D.O. [facility] [address] [place] [address]Cell Phone (Mon-Fri 8am-5pm):  [phone] Call:  [phone]& follow prompts after 5pm & weekends Office Phone:  [phone] Fax:  [phone]

## 2019-01-11 DIAGNOSIS — Z9189 Other specified personal risk factors, not elsewhere classified: Secondary | ICD-10-CM | POA: Diagnosis not present

## 2019-01-13 DIAGNOSIS — Z853 Personal history of malignant neoplasm of breast: Secondary | ICD-10-CM | POA: Insufficient documentation

## 2019-01-13 DIAGNOSIS — Z66 Do not resuscitate: Secondary | ICD-10-CM | POA: Insufficient documentation

## 2019-01-17 DIAGNOSIS — Z9189 Other specified personal risk factors, not elsewhere classified: Secondary | ICD-10-CM | POA: Diagnosis not present

## 2019-01-19 DIAGNOSIS — R278 Other lack of coordination: Secondary | ICD-10-CM | POA: Diagnosis not present

## 2019-01-19 DIAGNOSIS — I5032 Chronic diastolic (congestive) heart failure: Secondary | ICD-10-CM | POA: Diagnosis not present

## 2019-01-19 DIAGNOSIS — R2689 Other abnormalities of gait and mobility: Secondary | ICD-10-CM | POA: Diagnosis not present

## 2019-01-19 DIAGNOSIS — J441 Chronic obstructive pulmonary disease with (acute) exacerbation: Secondary | ICD-10-CM | POA: Diagnosis not present

## 2019-01-19 DIAGNOSIS — M62562 Muscle wasting and atrophy, not elsewhere classified, left lower leg: Secondary | ICD-10-CM | POA: Diagnosis not present

## 2019-01-19 DIAGNOSIS — M62561 Muscle wasting and atrophy, not elsewhere classified, right lower leg: Secondary | ICD-10-CM | POA: Diagnosis not present

## 2019-01-19 DIAGNOSIS — N183 Chronic kidney disease, stage 3 unspecified: Secondary | ICD-10-CM | POA: Diagnosis not present

## 2019-01-19 DIAGNOSIS — Z9181 History of falling: Secondary | ICD-10-CM | POA: Diagnosis not present

## 2019-01-19 DIAGNOSIS — M25511 Pain in right shoulder: Secondary | ICD-10-CM | POA: Diagnosis not present

## 2019-01-20 ENCOUNTER — Telehealth: Payer: Self-pay

## 2019-01-20 ENCOUNTER — Telehealth: Payer: Self-pay | Admitting: Internal Medicine

## 2019-01-20 DIAGNOSIS — I482 Chronic atrial fibrillation, unspecified: Secondary | ICD-10-CM | POA: Diagnosis not present

## 2019-01-20 NOTE — Telephone Encounter (Signed)
Nurse Rosann Auerbach from [facility] called to say patients INR was 2.2 and I sent this information to Dr. Mariea Clonts as the patient is soon due her coumadin per nurse Rosann Auerbach

## 2019-01-20 NOTE — Telephone Encounter (Signed)
Per Marisa Cyphers, MA:  Rosann Auerbach called to say that Patient [name] D.O.B. May 21, 1924 INR was 2.2 Please advise as she is due her coumadin very shortly per nurse   She should continue current dose of coumadin 34m nightly.  Recheck INR in 4 weeks.

## 2019-01-20 NOTE — Telephone Encounter (Signed)
Tried to call the nurse back at [facility] got machine left message for instructions for nurse

## 2019-01-23 DIAGNOSIS — M62561 Muscle wasting and atrophy, not elsewhere classified, right lower leg: Secondary | ICD-10-CM | POA: Diagnosis not present

## 2019-01-23 DIAGNOSIS — R278 Other lack of coordination: Secondary | ICD-10-CM | POA: Diagnosis not present

## 2019-01-23 DIAGNOSIS — Z9181 History of falling: Secondary | ICD-10-CM | POA: Diagnosis not present

## 2019-01-23 DIAGNOSIS — R2689 Other abnormalities of gait and mobility: Secondary | ICD-10-CM | POA: Diagnosis not present

## 2019-01-23 DIAGNOSIS — M25511 Pain in right shoulder: Secondary | ICD-10-CM | POA: Diagnosis not present

## 2019-01-23 DIAGNOSIS — M62562 Muscle wasting and atrophy, not elsewhere classified, left lower leg: Secondary | ICD-10-CM | POA: Diagnosis not present

## 2019-01-24 DIAGNOSIS — Z9181 History of falling: Secondary | ICD-10-CM | POA: Diagnosis not present

## 2019-01-24 DIAGNOSIS — M25511 Pain in right shoulder: Secondary | ICD-10-CM | POA: Diagnosis not present

## 2019-01-24 DIAGNOSIS — M62561 Muscle wasting and atrophy, not elsewhere classified, right lower leg: Secondary | ICD-10-CM | POA: Diagnosis not present

## 2019-01-24 DIAGNOSIS — R278 Other lack of coordination: Secondary | ICD-10-CM | POA: Diagnosis not present

## 2019-01-24 DIAGNOSIS — Z9189 Other specified personal risk factors, not elsewhere classified: Secondary | ICD-10-CM | POA: Diagnosis not present

## 2019-01-24 DIAGNOSIS — M62562 Muscle wasting and atrophy, not elsewhere classified, left lower leg: Secondary | ICD-10-CM | POA: Diagnosis not present

## 2019-01-24 DIAGNOSIS — R2689 Other abnormalities of gait and mobility: Secondary | ICD-10-CM | POA: Diagnosis not present

## 2019-01-30 DIAGNOSIS — R278 Other lack of coordination: Secondary | ICD-10-CM | POA: Diagnosis not present

## 2019-01-30 DIAGNOSIS — M62562 Muscle wasting and atrophy, not elsewhere classified, left lower leg: Secondary | ICD-10-CM | POA: Diagnosis not present

## 2019-01-30 DIAGNOSIS — R2689 Other abnormalities of gait and mobility: Secondary | ICD-10-CM | POA: Diagnosis not present

## 2019-01-30 DIAGNOSIS — M62561 Muscle wasting and atrophy, not elsewhere classified, right lower leg: Secondary | ICD-10-CM | POA: Diagnosis not present

## 2019-01-30 DIAGNOSIS — M25511 Pain in right shoulder: Secondary | ICD-10-CM | POA: Diagnosis not present

## 2019-01-30 DIAGNOSIS — Z9181 History of falling: Secondary | ICD-10-CM | POA: Diagnosis not present

## 2019-01-30 IMAGING — CR DG CHEST 2V
2 series · 2 of 2 positions shown · non-contrast
Comparison: 07/27/2014

CLINICAL DATA: Fall 3 days ago with right-sided chest pain, initial
encounter

EXAM:
CHEST  2 VIEW

[chest ap]
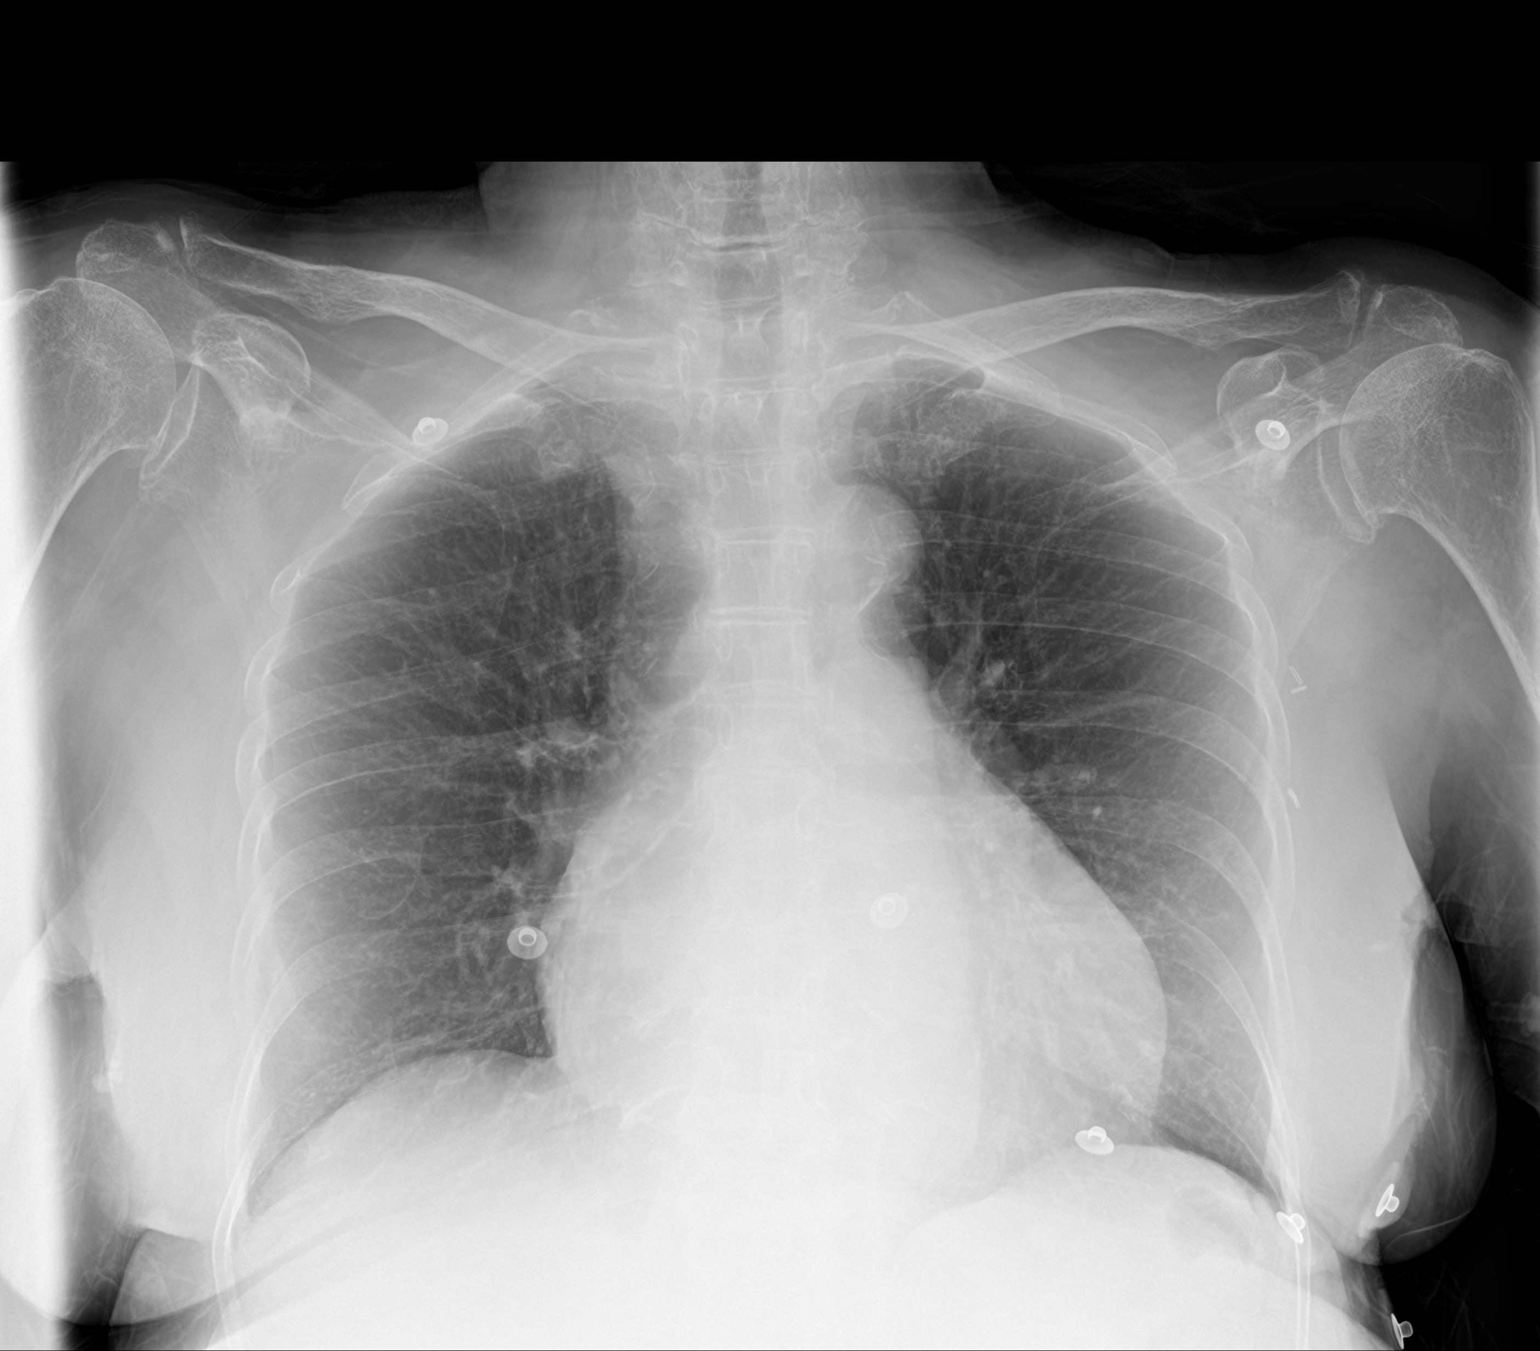

[chest lat]
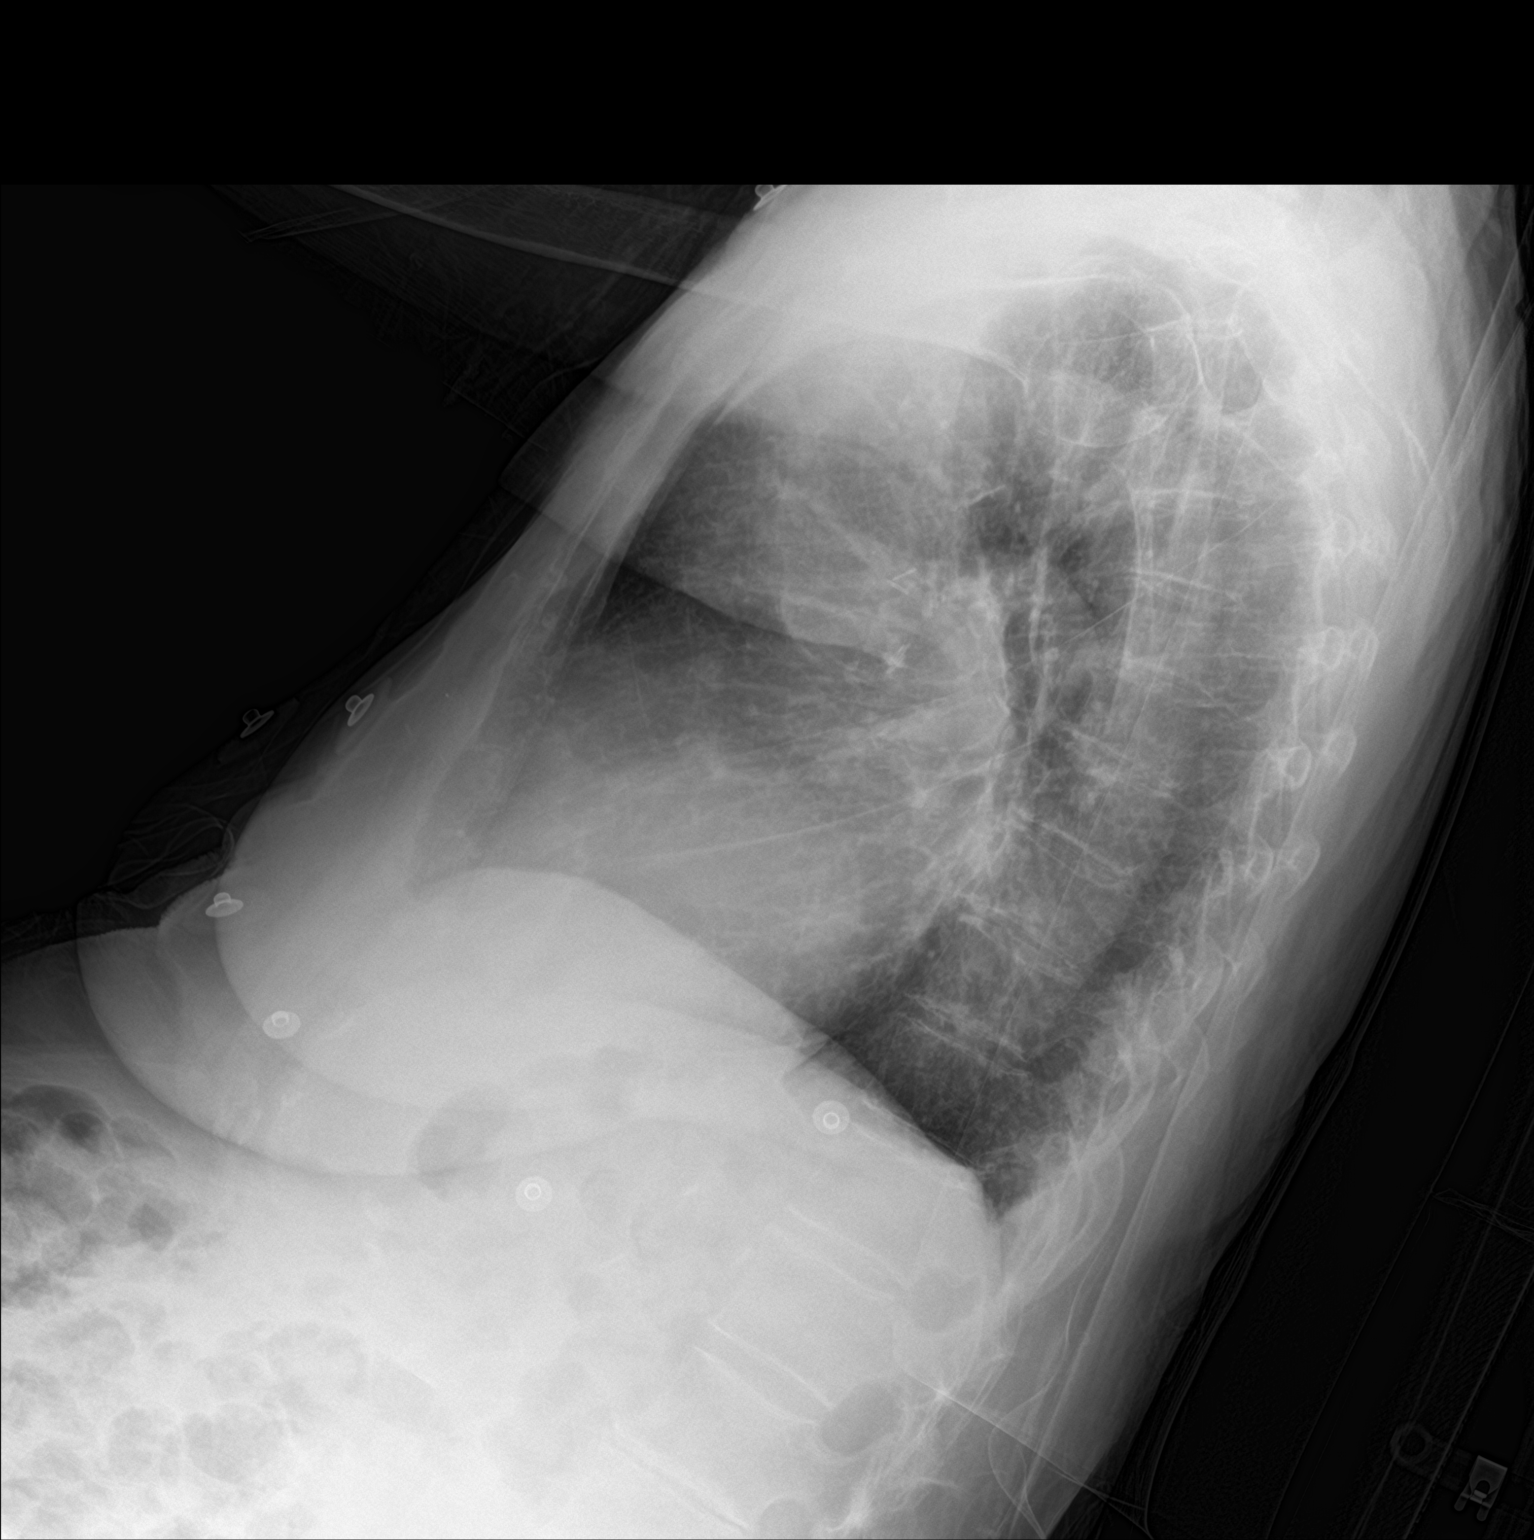

[2 of 2 positions shown; findings below may reference images not displayed]

FINDINGS: Cardiac shadow is mildly enlarged. Aortic calcifications are noted.
The lungs are well aerated bilaterally. No acute bony abnormality is
seen. No focal infiltrates are noted. Degenerative changes of the
acromioclavicular joints are seen bilaterally.
IMPRESSION: No acute abnormality noted.

## 2019-01-30 IMAGING — CT CT HEAD W/O CM
5 of 8 series · 17 of 47 positions shown, 18 images · non-contrast
Comparison: None.

CLINICAL DATA: Fall 3 days ago.  Headache.  Initial encounter.

EXAM:
CT HEAD WITHOUT CONTRAST
CT CERVICAL SPINE WITHOUT CONTRAST
TECHNIQUE: Multidetector CT imaging of the head and cervical spine was
performed following the standard protocol without intravenous
contrast. Multiplanar CT image reconstructions of the cervical spine
were also generated.

[Series 4: head without · axial · non-contrast · 0.40mm/px · z∈[-73,+82]mm · 3 of 32 slices shown, 4 images]
[im 1/32  brain]
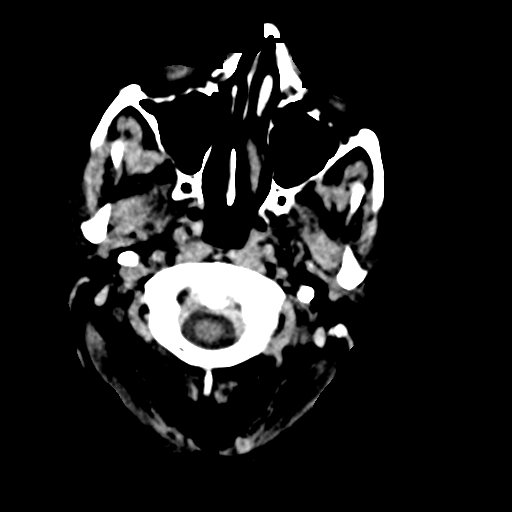
[im 1/32  bone]
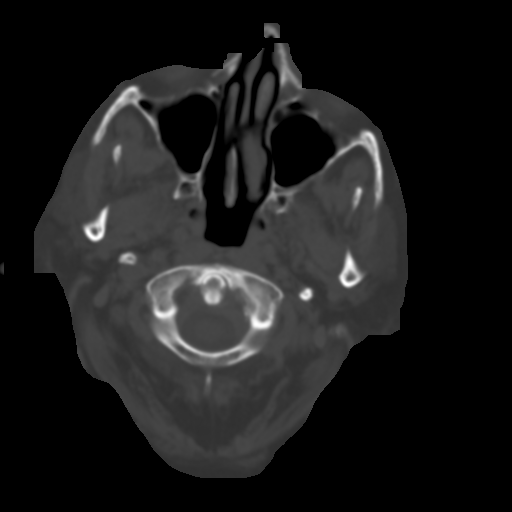
[im 16/32  brain]
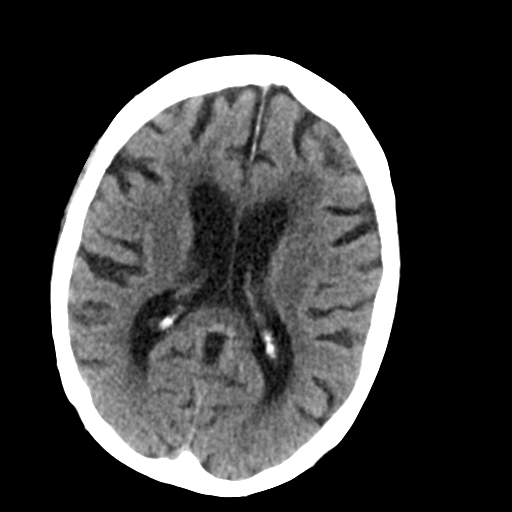
[im 32/32  brain]
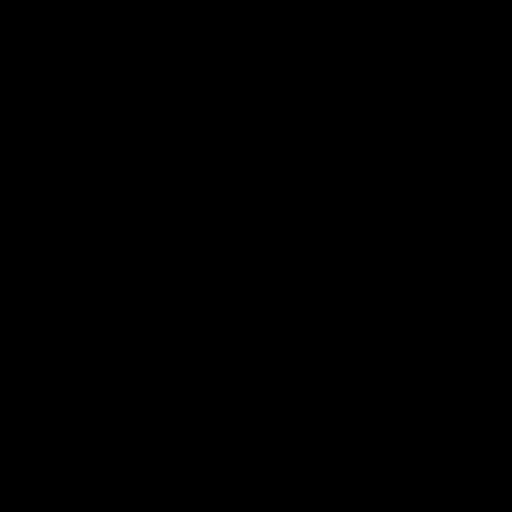

[Series 5: head bone · axial · 0.40mm/px · z∈[-55,+63]mm · 7 of 79 slices shown]
[im 10/79  bone]
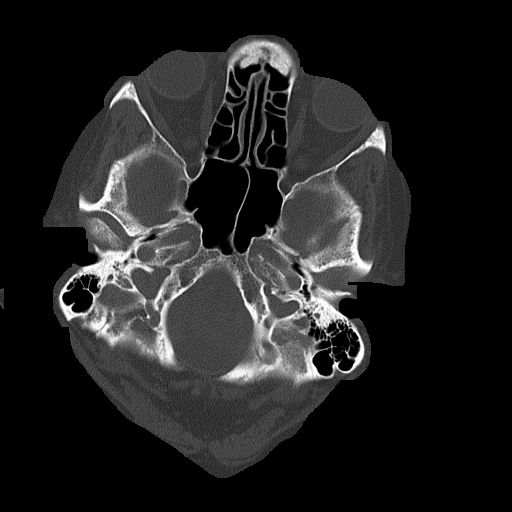
[im 20/79  bone]
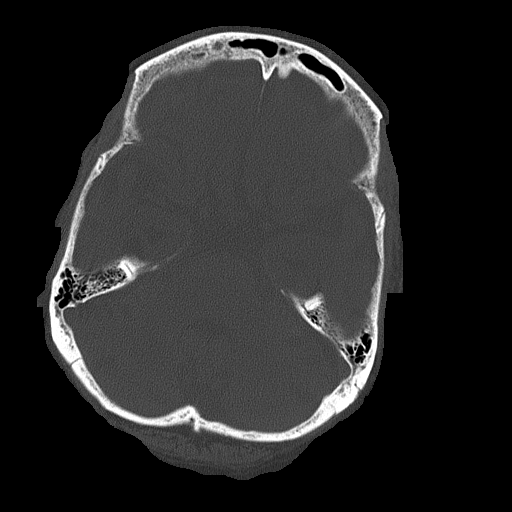
[im 30/79  bone]
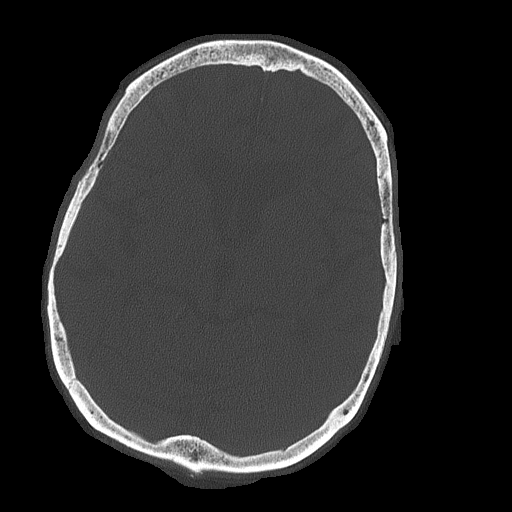
[im 40/79  bone]
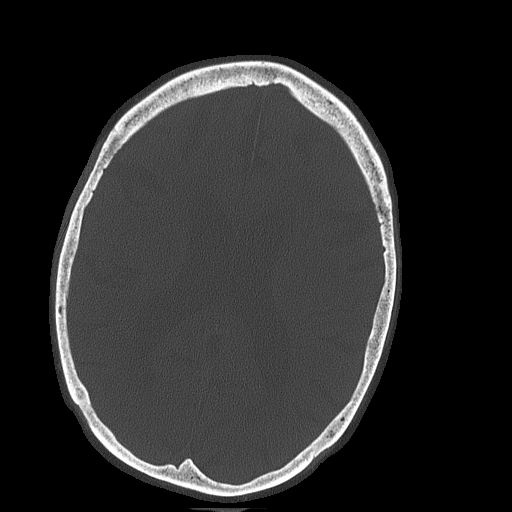
[im 49/79  bone]
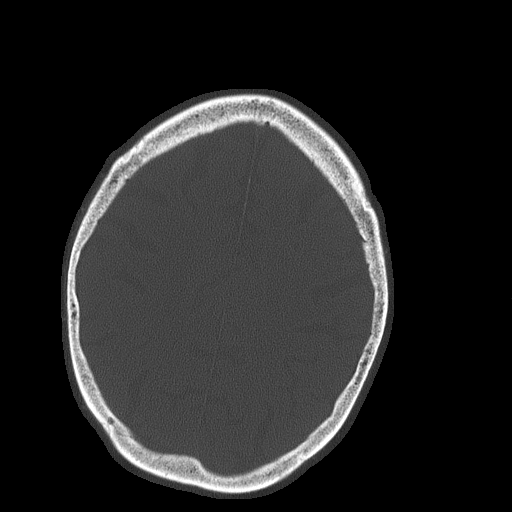
[im 59/79  bone]
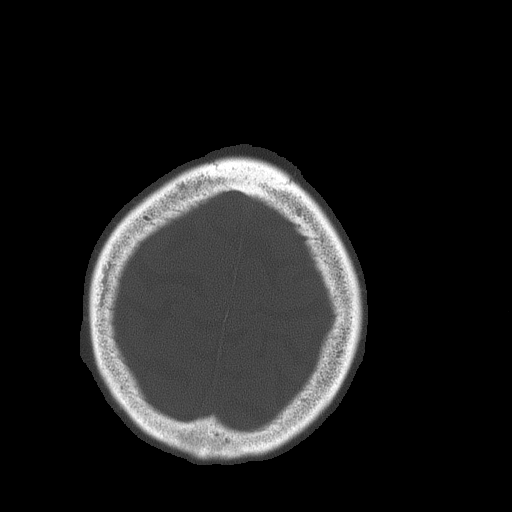
[im 69/79  bone]
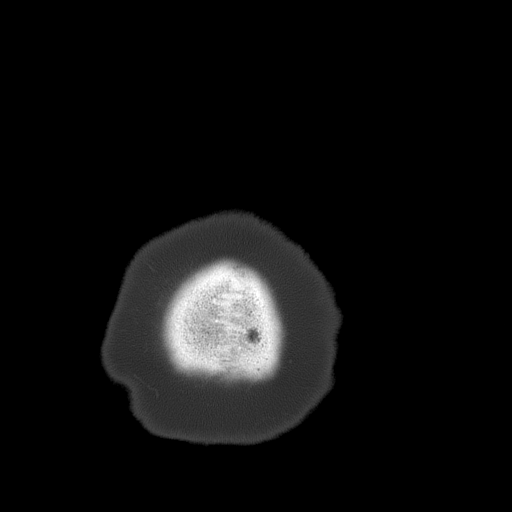

[Series 6: head without cor · coronal · non-contrast · 0.30mm/px · 3 of 67 slices shown]
[im 4/67  brain]
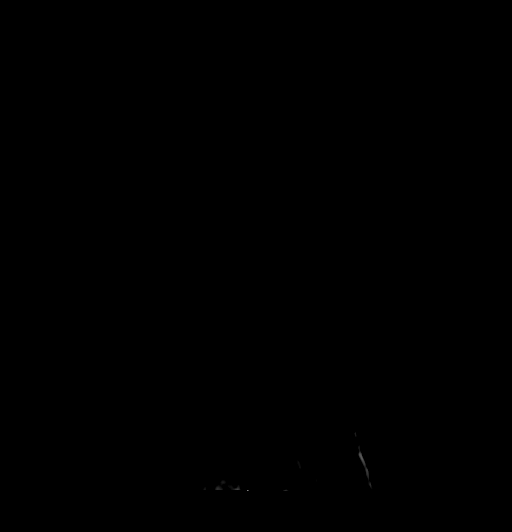
[im 8/67  brain]
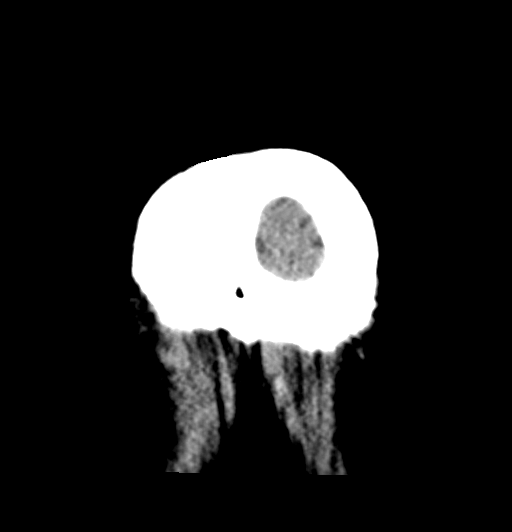
[im 12/67  brain]
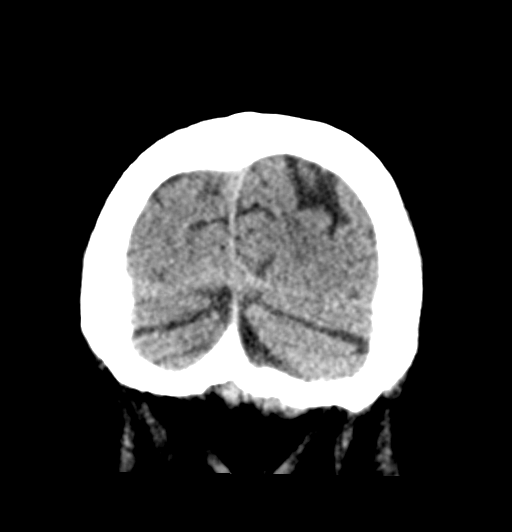

[Series 7: head without sag · sagittal · non-contrast · 0.31mm/px · 2 of 67 slices shown]
[im 23/67  brain]
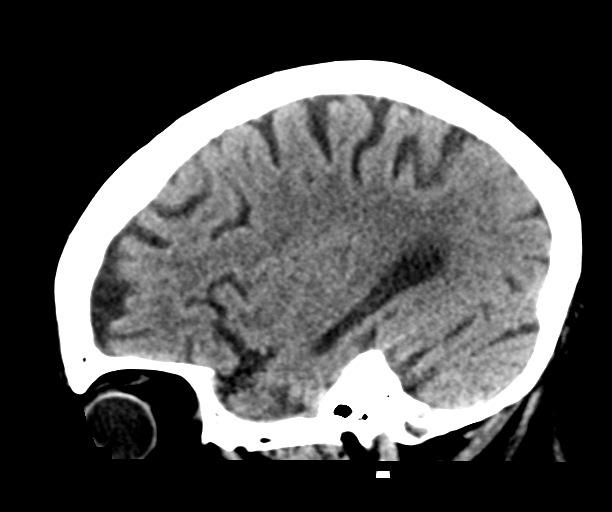
[im 45/67  brain]
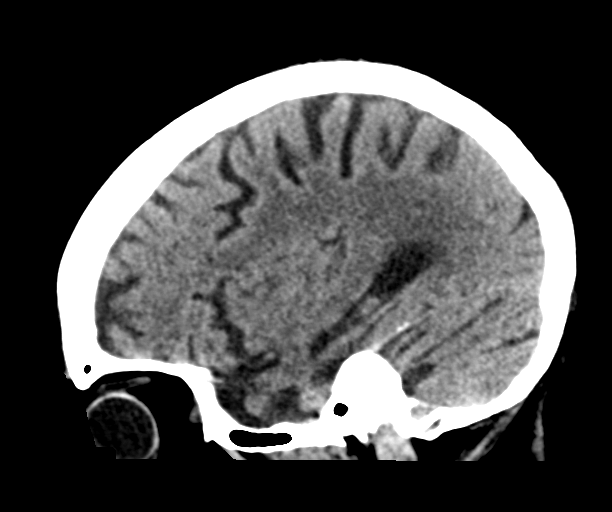

[Series 9: c_spine 2.0 st · axial · 0.27mm/px · z∈[-187,-169]mm · 2 of 74 slices shown]
[im 10/74  brain]
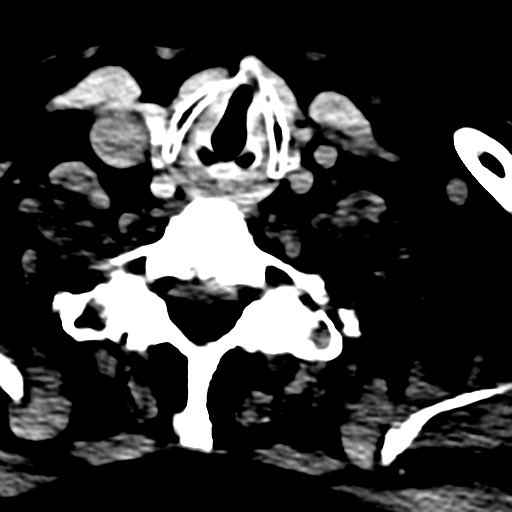
[im 19/74  brain]
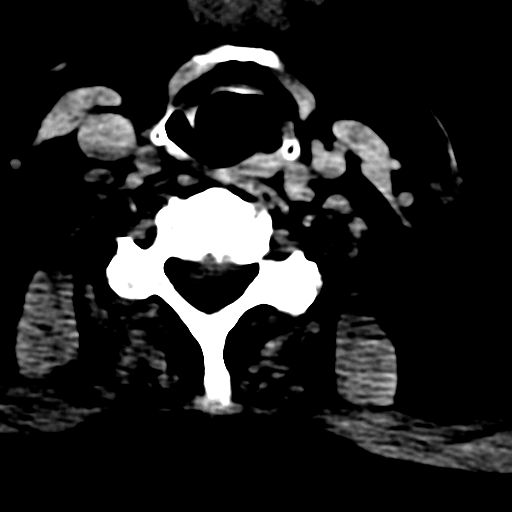

[17 of 47 positions shown; findings below may reference images not displayed]

FINDINGS: CT HEAD FINDINGS

Brain: There is no evidence of acute cortical infarct, intracranial
hemorrhage, mass, midline shift, or extra-axial fluid collection.
Mild cerebral atrophy is within normal limits for age. Patchy
bilateral cerebral white matter hypodensities are nonspecific but
compatible with chronic small vessel ischemic disease, mild for age.

Vascular: Calcified atherosclerosis at the skullbase. No hyperdense
vessel.

Skull: No fracture or focal osseous lesion.

Sinuses/Orbits: Visualized paranasal sinuses and mastoid air cells
are clear. Prior bilateral cataract extraction.

Other: None.

CT CERVICAL SPINE FINDINGS

Alignment: Minimal anterolisthesis of C3 on C4 and C4 on C5, likely
degenerative and facet mediated.

Skull base and vertebrae: No acute fracture or destructive osseous
process identified.

Soft tissues and spinal canal: No prevertebral fluid or swelling. No
visible canal hematoma.

Disc levels: Advanced disc degeneration at C6-7 with severe disc
space narrowing. Bilateral posterior element ankylosis as well as
partial interbody ankylosis from C4-C6. Advanced facet arthrosis
bilaterally at C2-3 and C3-4 and on the left at C6-7. No high-grade
spinal stenosis or high-grade osseous neural foraminal stenosis.

Upper chest: Unremarkable.

Other: None.
IMPRESSION: 1. No evidence of acute intracranial abnormality.
2. Mild chronic small vessel ischemic disease.
3. No evidence of acute cervical spine fracture or traumatic
subluxation.

## 2019-01-31 DIAGNOSIS — J441 Chronic obstructive pulmonary disease with (acute) exacerbation: Secondary | ICD-10-CM | POA: Diagnosis not present

## 2019-01-31 DIAGNOSIS — I5032 Chronic diastolic (congestive) heart failure: Secondary | ICD-10-CM | POA: Diagnosis not present

## 2019-01-31 DIAGNOSIS — N183 Chronic kidney disease, stage 3 unspecified: Secondary | ICD-10-CM | POA: Diagnosis not present

## 2019-01-31 DIAGNOSIS — R278 Other lack of coordination: Secondary | ICD-10-CM | POA: Diagnosis not present

## 2019-01-31 DIAGNOSIS — M62561 Muscle wasting and atrophy, not elsewhere classified, right lower leg: Secondary | ICD-10-CM | POA: Diagnosis not present

## 2019-01-31 DIAGNOSIS — M25511 Pain in right shoulder: Secondary | ICD-10-CM | POA: Diagnosis not present

## 2019-01-31 DIAGNOSIS — M62562 Muscle wasting and atrophy, not elsewhere classified, left lower leg: Secondary | ICD-10-CM | POA: Diagnosis not present

## 2019-01-31 DIAGNOSIS — Z9181 History of falling: Secondary | ICD-10-CM | POA: Diagnosis not present

## 2019-01-31 DIAGNOSIS — R2689 Other abnormalities of gait and mobility: Secondary | ICD-10-CM | POA: Diagnosis not present

## 2019-02-02 ENCOUNTER — Non-Acute Institutional Stay (SKILLED_NURSING_FACILITY): Payer: Medicare Other | Admitting: Adult Health

## 2019-02-02 DIAGNOSIS — R278 Other lack of coordination: Secondary | ICD-10-CM | POA: Diagnosis not present

## 2019-02-02 DIAGNOSIS — R58 Hemorrhage, not elsewhere classified: Secondary | ICD-10-CM | POA: Diagnosis not present

## 2019-02-02 DIAGNOSIS — L03113 Cellulitis of right upper limb: Secondary | ICD-10-CM

## 2019-02-02 DIAGNOSIS — Z7901 Long term (current) use of anticoagulants: Secondary | ICD-10-CM

## 2019-02-02 DIAGNOSIS — I4821 Permanent atrial fibrillation: Secondary | ICD-10-CM

## 2019-02-02 DIAGNOSIS — R2689 Other abnormalities of gait and mobility: Secondary | ICD-10-CM | POA: Diagnosis not present

## 2019-02-02 DIAGNOSIS — Z9181 History of falling: Secondary | ICD-10-CM | POA: Diagnosis not present

## 2019-02-02 DIAGNOSIS — I2781 Cor pulmonale (chronic): Secondary | ICD-10-CM | POA: Diagnosis not present

## 2019-02-02 DIAGNOSIS — M62562 Muscle wasting and atrophy, not elsewhere classified, left lower leg: Secondary | ICD-10-CM | POA: Diagnosis not present

## 2019-02-02 DIAGNOSIS — S81802S Unspecified open wound, left lower leg, sequela: Secondary | ICD-10-CM

## 2019-02-02 DIAGNOSIS — M62561 Muscle wasting and atrophy, not elsewhere classified, right lower leg: Secondary | ICD-10-CM | POA: Diagnosis not present

## 2019-02-02 DIAGNOSIS — M25511 Pain in right shoulder: Secondary | ICD-10-CM | POA: Diagnosis not present

## 2019-02-03 ENCOUNTER — Encounter: Payer: Self-pay | Admitting: Adult Health

## 2019-02-03 DIAGNOSIS — R2689 Other abnormalities of gait and mobility: Secondary | ICD-10-CM | POA: Diagnosis not present

## 2019-02-03 DIAGNOSIS — Z9181 History of falling: Secondary | ICD-10-CM | POA: Diagnosis not present

## 2019-02-03 DIAGNOSIS — M62561 Muscle wasting and atrophy, not elsewhere classified, right lower leg: Secondary | ICD-10-CM | POA: Diagnosis not present

## 2019-02-03 DIAGNOSIS — M25511 Pain in right shoulder: Secondary | ICD-10-CM | POA: Diagnosis not present

## 2019-02-03 DIAGNOSIS — R278 Other lack of coordination: Secondary | ICD-10-CM | POA: Diagnosis not present

## 2019-02-03 DIAGNOSIS — M62562 Muscle wasting and atrophy, not elsewhere classified, left lower leg: Secondary | ICD-10-CM | POA: Diagnosis not present

## 2019-02-03 NOTE — Progress Notes (Signed)
Location:  Occupational psychologist of Service:  SNF (31) Provider:  Cindi Carbon, ANP [place] [phone]  [name], DO  Patient Care Team: [name], DO as PCP - General (Geriatric Medicine) Sanda Klein, MD as PCP - Cardiology (Cardiology)  Extended Emergency Contact Information Primary Emergency Contact: [name] of [place] Phone: [phone] Work Phone: [phone] Mobile Phone: [phone] Relation: Daughter Secondary Emergency Contact: Katharina, [place] Work Phone: [phone] Mobile Phone: [phone] Relation: Son  Code Status: DNR Goals of care: Advanced Directive information Advanced Directives 01/10/2019  Does Patient Have a Medical Advance Directive? Yes  Type of Paramedic of [place];Living will;Out of facility DNR (pink MOST or yellow form)  Does patient want to make changes to medical advance directive? No - Patient declined  Copy of [place] in Chart? Yes - validated most recent copy scanned in chart (See row information)  Would patient like information on creating a medical advance directive? -  Pre-existing out of facility DNR order (yellow form or pink MOST form) Yellow form placed in chart (order not valid for inpatient use)     Chief Complaint  Patient presents with  . Acute Visit    right arm wound, foot bruising  . Medical Management of Chronic Issues    HPI:  Pt is a 85 y.o. female seen today for a right arm wound and excessive bruising, as well as medical management of chronic diseases.   She reports excessive bruising to her feet. She has been using a sitting bike in therapy with straps on her feet. She is working with PT to gain strength and has made progress per their report.  She has a skin cancer lesion to her right forearm that is enlarging and has purulent drainage, pain and redness. The nurse is questioning what is the  appropriate dressing.   She has a skin cancer lesion to the LLE that was removed and has been difficult to heal and chronically painful. This area is finally healing with adaptic dressing changes. She is also less edematous and taking prostat for wound healing.   Cor pulmonale: weight has trended down over time. Improvement in [place] per resident. Dry cough which is chronic is not changed. No CP or palpitations.  Wt Readings from Last 3 Encounters:  02/03/19 142 lb 9.6 oz (64.7 kg)  01/10/19 143 lb 12.8 oz (65.2 kg)  12/15/18 144 lb 12.8 oz (65.7 kg)     Past Medical History:  Diagnosis Date  . Arterial leg ulcer ([facility]) 10/14/2018  . Breast cancer ([facility])    left   . Centrilobular emphysema ([facility]) 12/23/2017  . Chronic anticoagulation   . Chronic atrial fibrillation ([place])   . Chronic respiratory failure with hypoxia ([facility]) 03/26/2018  . CKD (chronic kidney disease), stage III   . Cor pulmonale, chronic ([facility] [place]) 06/13/2013   Pulmonary artery pressure 72 06/15/17 with severe tricuspid regurg  . Essential hypertension 06/13/2013  . Hypertension   . PAH (pulmonary artery hypertension) ([facility]) 03/25/2017   Pulmonary artery pressure 72 06/15/17 with severe tricuspid regurg  . Pelvic fracture ([place]) 05/2016  . Permanent atrial fibrillation ([facility]) 03/26/2018   Past Surgical History:  Procedure Laterality Date  . ABDOMINAL HYSTERECTOMY    . BREAST LUMPECTOMY      Allergies  Allergen Reactions  . Aspirin     Upset stomach  . Codeine Nausea Only    Nausea  Outpatient Encounter Medications as of 02/02/2019  Medication Sig  . allopurinol (ZYLOPRIM) 100 MG tablet Take 100 mg by mouth at bedtime.   . ALPRAZolam (XANAX) 0.5 MG tablet Take 1 tablet (0.5 mg total) by mouth at bedtime.  . Amino Acids-Protein Hydrolys (FEEDING SUPPLEMENT, PRO-STAT SUGAR FREE 64,) LIQD 1 fl oz by mouth daily  . antiseptic oral rinse (BIOTENE) LIQD 15 mLs by Mouth Rinse route as needed for dry mouth.  . benzonatate  (TESSALON) 100 MG capsule Take 100 mg by mouth 3 (three) times daily as needed for cough.  . Calcium Carbonate-Vitamin D3 (CALCIUM 600-D) 600-400 MG-UNIT TABS Take 1 tablet by mouth daily.  Marland Kitchen dextromethorphan (DELSYM) 30 MG/5ML liquid Take 5 mLs by mouth every 4 (four) hours as needed for cough.   . diclofenac sodium (VOLTAREN) 1 % GEL Apply 2 g topically 4 (four) times daily. Right shoulder  . feeding supplement (BOOST HIGH PROTEIN) LIQD Take 1 Container by mouth daily.   Marland Kitchen HYDROcodone-acetaminophen (NORCO) 5-325 MG tablet Take 1 tablet by mouth every 6 (six) hours as needed for severe pain.  Marland Kitchen ipratropium (ATROVENT) 0.02 % nebulizer solution Take 2.5 mLs (0.5 mg total) by nebulization 3 (three) times daily.  Marland Kitchen levalbuterol (XOPENEX) 1.25 MG/0.5ML nebulizer solution Take 1.25 mg by nebulization 3 (three) times daily.  Marland Kitchen levalbuterol (XOPENEX) 1.25 MG/3ML nebulizer solution Take 1.25 mg by nebulization every 6 (six) hours as needed for wheezing or shortness of breath. In addition to three times daily schedule  . loratadine (CLARITIN) 10 MG tablet Take 10 mg by mouth at bedtime.  . metoprolol succinate (TOPROL-XL) 25 MG 24 hr tablet Take 12.5 mg by mouth daily.  . mineral oil-hydrophilic petrolatum (AQUAPHOR) ointment Apply 1 application topically 2 (two) times daily. Apply to face/chest, and both upper arms  . OXYGEN Level 3 or 3 mL/minute when out of room every shift 1, 2, and 3  . Polyethyl Glyc-Propyl Glyc PF (SYSTANE PRESERVATIVE FREE) 0.4-0.3 % SOLN Apply 1 drop to eye 3 (three) times daily as needed (dry eyes).  . polyethylene glycol (MIRALAX / GLYCOLAX) 17 g packet Take 17 g by mouth daily.   Marland Kitchen spironolactone (ALDACTONE) 25 MG tablet Take 12.5 mg by mouth daily.  Marland Kitchen torsemide (DEMADEX) 20 MG tablet Take 40 mg by mouth daily.   Marland Kitchen warfarin (COUMADIN) 2 MG tablet Take 1 tablet (2 mg total) by mouth daily at 6 PM.  . acetaminophen (TYLENOL) 325 MG tablet Take 650 mg by mouth 2 (two) times daily.   Marland Kitchen triamcinolone cream (KENALOG) 0.1 % Apply 1 application topically as needed. To right arm for itching  . [DISCONTINUED] sodium chloride (OCEAN) 0.65 % SOLN nasal spray Place 1 spray into both nostrils as needed for congestion.   No facility-administered encounter medications on file as of 02/02/2019.     Review of Systems  Constitutional: Negative for activity change, appetite change, chills, diaphoresis, fatigue, fever and unexpected weight change.  HENT: Negative for congestion.   Respiratory: Positive for cough (dry chronic) and shortness of breath (with exertion chronic). Negative for wheezing.   Cardiovascular: Negative for chest pain, palpitations and leg swelling.  Gastrointestinal: Negative for abdominal distention, abdominal pain, constipation and diarrhea.  Genitourinary: Negative for difficulty urinating and dysuria.  Musculoskeletal: Positive for gait problem. Negative for arthralgias, back pain, joint swelling and myalgias.  Skin: Positive for color change and wound.  Neurological: Negative for dizziness, tremors, seizures, syncope, facial asymmetry, speech difficulty, weakness, light-headedness, numbness and headaches.  Psychiatric/Behavioral: Negative for agitation, behavioral problems and confusion.    Immunization History  Administered Date(s) Administered  . Influenza Split 12/07/2014  . Influenza, High Dose Seasonal PF 12/15/2018  . Influenza-Unspecified 11/05/2013, 12/02/2016, 12/16/2017  . Pneumococcal Polysaccharide-23 03/02/2012  . Tdap 03/02/2012  . Zoster 03/03/2011   Pertinent  Health Maintenance Due  Topic Date Due  . PNA vac Low Risk Adult (2 of 2 - PCV13) 03/02/2013  . INFLUENZA VACCINE  Completed  . DEXA SCAN  Completed   Fall Risk  01/13/2019  Risk for fall due to : History of fall(s);Impaired balance/gait;Impaired mobility;Medication side effect;Mental status change  Follow up Falls evaluation completed;Education provided;Falls prevention  discussed  Comment done at [facility] as part of care plan   Functional Status Survey:    Vitals:   02/03/19 0910  BP: 108/69  Pulse: 77  Resp: 16  Temp: 98 F (36.7 C)  SpO2: 98%  Weight: 142 lb 9.6 oz (64.7 kg)   Body mass index is 24.48 kg/m. Physical Exam Vitals signs and nursing note reviewed.  Constitutional:      General: She is not in acute distress.    Appearance: She is not diaphoretic.  HENT:     Head: Normocephalic and atraumatic.  Neck:     Vascular: No JVD.  Cardiovascular:     Rate and Rhythm: Normal rate. Rhythm irregular.     Heart sounds: No murmur.  Pulmonary:     Effort: Pulmonary effort is normal. No respiratory distress.     Breath sounds: Normal breath sounds. No wheezing.     Comments: Decreased throughout Abdominal:     General: Bowel sounds are normal. There is no distension.     Palpations: Abdomen is soft.  Musculoskeletal:     Right lower leg: No edema.     Left lower leg: No edema.  Skin:    General: Skin is warm and dry.     Comments: LLE with 0.5 cm lateral mid lower ext lesion mostly healed. No redness or drainage  Neurological:     Mental Status: She is alert and oriented to person, place, and time.  Psychiatric:        Mood and Affect: Mood normal.     Labs reviewed: Recent Labs    03/14/18 1407 03/15/18 0820  10/31/18 11/01/18 0600 11/14/18  NA 139 135   < > 136* 136* 143  K 4.5 4.2   < > 4.5 4.5 4.0  CL 96* 97*  --   --   --   --   CO2 27 26  --   --   --   --   GLUCOSE 136* 141*  --   --   --   --   BUN 45* 51*   < > 61* 61* 49*  CREATININE 1.23* 1.05*   < > 1.2* 1.2* 1.1  CALCIUM 9.6 8.9  --   --   --   --    < > = values in this interval not displayed.   Recent Labs    03/15/18 0820  AST 31  ALT 20  ALKPHOS 81  BILITOT 1.2  PROT 6.3*  ALBUMIN 3.4*   Recent Labs    03/14/18 1407 03/15/18 0820 03/21/18 0300 11/01/18 11/01/18 0600  WBC 10.9* 10.5 10.9 5.9 5.9  NEUTROABS 7.9*  --   --   --   --    HGB 15.3* 13.5 14.5 12.8 12.8  HCT 47.4* 40.6 43 38 38  MCV 111.0* 109.1*  --   --   --   PLT 164 146* 205 167 167   Lab Results  Component Value Date   TSH 1.602 11/18/2017   No results found for: HGBA1C No results found for: CHOL, HDL, LDLCALC, LDLDIRECT, TRIG, CHOLHDL  Significant Diagnostic Results in last 30 days:  No results found.  Assessment/Plan 1. Right arm cellulitis Keflex 500 mg tid x 7 days. Florastor 1 cap bid x 7 days. There is an area of skin cancer to the forearm that has spreading redness and purulent drainage. It is difficult to assess how much of this is due to the cancer vs infection. She has decided to forego any additional treatment of the skin cancer at this time. Will treat for infection and monitor.  Cleanse area with NS, apply adaptic and wrap in kerlix. Change qd.   2. Ecchymosis Excessive to both feet possibly from the bicycle strap. Staff to alter the application of the bike. Check INR.today and Monday due to antibiotic therapy and excessive bruising.   3. Leg wound, left, sequela Much improved, continue adaptic dressing changes per [facility]  4. Permanent atrial fibrillation (HCC) Rate controlled with metoprolol. Continue coumadin for CVA risk reduction  5. Long term (current) use of anticoagulants Continue coumadin 2 mg qd due to above hx.   6. Cor pulmonale, chronic (HCC) Appears well compensated Continue torsemide 40 mg qd and aldactone 12.5 mg qd Monitor weight weekly  Family/ staff Communication: resident   Labs/tests ordered:  INR per pt request

## 2019-02-06 DIAGNOSIS — I4891 Unspecified atrial fibrillation: Secondary | ICD-10-CM | POA: Diagnosis not present

## 2019-02-09 DIAGNOSIS — M25511 Pain in right shoulder: Secondary | ICD-10-CM | POA: Diagnosis not present

## 2019-02-09 DIAGNOSIS — R2689 Other abnormalities of gait and mobility: Secondary | ICD-10-CM | POA: Diagnosis not present

## 2019-02-09 DIAGNOSIS — R278 Other lack of coordination: Secondary | ICD-10-CM | POA: Diagnosis not present

## 2019-02-09 DIAGNOSIS — M62561 Muscle wasting and atrophy, not elsewhere classified, right lower leg: Secondary | ICD-10-CM | POA: Diagnosis not present

## 2019-02-09 DIAGNOSIS — M62562 Muscle wasting and atrophy, not elsewhere classified, left lower leg: Secondary | ICD-10-CM | POA: Diagnosis not present

## 2019-02-09 DIAGNOSIS — Z9181 History of falling: Secondary | ICD-10-CM | POA: Diagnosis not present

## 2019-02-10 DIAGNOSIS — R2689 Other abnormalities of gait and mobility: Secondary | ICD-10-CM | POA: Diagnosis not present

## 2019-02-10 DIAGNOSIS — R278 Other lack of coordination: Secondary | ICD-10-CM | POA: Diagnosis not present

## 2019-02-10 DIAGNOSIS — M62562 Muscle wasting and atrophy, not elsewhere classified, left lower leg: Secondary | ICD-10-CM | POA: Diagnosis not present

## 2019-02-10 DIAGNOSIS — M25511 Pain in right shoulder: Secondary | ICD-10-CM | POA: Diagnosis not present

## 2019-02-10 DIAGNOSIS — Z9181 History of falling: Secondary | ICD-10-CM | POA: Diagnosis not present

## 2019-02-10 DIAGNOSIS — M62561 Muscle wasting and atrophy, not elsewhere classified, right lower leg: Secondary | ICD-10-CM | POA: Diagnosis not present

## 2019-02-13 DIAGNOSIS — Z9181 History of falling: Secondary | ICD-10-CM | POA: Diagnosis not present

## 2019-02-13 DIAGNOSIS — M25511 Pain in right shoulder: Secondary | ICD-10-CM | POA: Diagnosis not present

## 2019-02-13 DIAGNOSIS — M62561 Muscle wasting and atrophy, not elsewhere classified, right lower leg: Secondary | ICD-10-CM | POA: Diagnosis not present

## 2019-02-13 DIAGNOSIS — R278 Other lack of coordination: Secondary | ICD-10-CM | POA: Diagnosis not present

## 2019-02-13 DIAGNOSIS — R2689 Other abnormalities of gait and mobility: Secondary | ICD-10-CM | POA: Diagnosis not present

## 2019-02-13 DIAGNOSIS — M62562 Muscle wasting and atrophy, not elsewhere classified, left lower leg: Secondary | ICD-10-CM | POA: Diagnosis not present

## 2019-02-14 DIAGNOSIS — M62561 Muscle wasting and atrophy, not elsewhere classified, right lower leg: Secondary | ICD-10-CM | POA: Diagnosis not present

## 2019-02-14 DIAGNOSIS — M25511 Pain in right shoulder: Secondary | ICD-10-CM | POA: Diagnosis not present

## 2019-02-14 DIAGNOSIS — M62562 Muscle wasting and atrophy, not elsewhere classified, left lower leg: Secondary | ICD-10-CM | POA: Diagnosis not present

## 2019-02-14 DIAGNOSIS — R2689 Other abnormalities of gait and mobility: Secondary | ICD-10-CM | POA: Diagnosis not present

## 2019-02-14 DIAGNOSIS — R278 Other lack of coordination: Secondary | ICD-10-CM | POA: Diagnosis not present

## 2019-02-14 DIAGNOSIS — Z9181 History of falling: Secondary | ICD-10-CM | POA: Diagnosis not present

## 2019-02-16 DIAGNOSIS — R2689 Other abnormalities of gait and mobility: Secondary | ICD-10-CM | POA: Diagnosis not present

## 2019-02-16 DIAGNOSIS — R278 Other lack of coordination: Secondary | ICD-10-CM | POA: Diagnosis not present

## 2019-02-16 DIAGNOSIS — M62561 Muscle wasting and atrophy, not elsewhere classified, right lower leg: Secondary | ICD-10-CM | POA: Diagnosis not present

## 2019-02-16 DIAGNOSIS — M62562 Muscle wasting and atrophy, not elsewhere classified, left lower leg: Secondary | ICD-10-CM | POA: Diagnosis not present

## 2019-02-16 DIAGNOSIS — Z9181 History of falling: Secondary | ICD-10-CM | POA: Diagnosis not present

## 2019-02-16 DIAGNOSIS — M25511 Pain in right shoulder: Secondary | ICD-10-CM | POA: Diagnosis not present

## 2019-02-17 DIAGNOSIS — Z9181 History of falling: Secondary | ICD-10-CM | POA: Diagnosis not present

## 2019-02-17 DIAGNOSIS — M62562 Muscle wasting and atrophy, not elsewhere classified, left lower leg: Secondary | ICD-10-CM | POA: Diagnosis not present

## 2019-02-17 DIAGNOSIS — R278 Other lack of coordination: Secondary | ICD-10-CM | POA: Diagnosis not present

## 2019-02-17 DIAGNOSIS — M25511 Pain in right shoulder: Secondary | ICD-10-CM | POA: Diagnosis not present

## 2019-02-17 DIAGNOSIS — M62561 Muscle wasting and atrophy, not elsewhere classified, right lower leg: Secondary | ICD-10-CM | POA: Diagnosis not present

## 2019-02-17 DIAGNOSIS — R2689 Other abnormalities of gait and mobility: Secondary | ICD-10-CM | POA: Diagnosis not present

## 2019-02-20 DIAGNOSIS — M62562 Muscle wasting and atrophy, not elsewhere classified, left lower leg: Secondary | ICD-10-CM | POA: Diagnosis not present

## 2019-02-20 DIAGNOSIS — R2689 Other abnormalities of gait and mobility: Secondary | ICD-10-CM | POA: Diagnosis not present

## 2019-02-20 DIAGNOSIS — R278 Other lack of coordination: Secondary | ICD-10-CM | POA: Diagnosis not present

## 2019-02-20 DIAGNOSIS — Z9181 History of falling: Secondary | ICD-10-CM | POA: Diagnosis not present

## 2019-02-20 DIAGNOSIS — M62561 Muscle wasting and atrophy, not elsewhere classified, right lower leg: Secondary | ICD-10-CM | POA: Diagnosis not present

## 2019-02-20 DIAGNOSIS — M25511 Pain in right shoulder: Secondary | ICD-10-CM | POA: Diagnosis not present

## 2019-02-21 DIAGNOSIS — M62561 Muscle wasting and atrophy, not elsewhere classified, right lower leg: Secondary | ICD-10-CM | POA: Diagnosis not present

## 2019-02-21 DIAGNOSIS — R278 Other lack of coordination: Secondary | ICD-10-CM | POA: Diagnosis not present

## 2019-02-21 DIAGNOSIS — M62562 Muscle wasting and atrophy, not elsewhere classified, left lower leg: Secondary | ICD-10-CM | POA: Diagnosis not present

## 2019-02-21 DIAGNOSIS — Z9181 History of falling: Secondary | ICD-10-CM | POA: Diagnosis not present

## 2019-02-21 DIAGNOSIS — R2689 Other abnormalities of gait and mobility: Secondary | ICD-10-CM | POA: Diagnosis not present

## 2019-02-21 DIAGNOSIS — M25511 Pain in right shoulder: Secondary | ICD-10-CM | POA: Diagnosis not present

## 2019-02-27 DIAGNOSIS — Z20828 Contact with and (suspected) exposure to other viral communicable diseases: Secondary | ICD-10-CM | POA: Diagnosis not present

## 2019-02-28 DIAGNOSIS — R278 Other lack of coordination: Secondary | ICD-10-CM | POA: Diagnosis not present

## 2019-02-28 DIAGNOSIS — M62562 Muscle wasting and atrophy, not elsewhere classified, left lower leg: Secondary | ICD-10-CM | POA: Diagnosis not present

## 2019-02-28 DIAGNOSIS — R2689 Other abnormalities of gait and mobility: Secondary | ICD-10-CM | POA: Diagnosis not present

## 2019-02-28 DIAGNOSIS — M62561 Muscle wasting and atrophy, not elsewhere classified, right lower leg: Secondary | ICD-10-CM | POA: Diagnosis not present

## 2019-02-28 DIAGNOSIS — M25511 Pain in right shoulder: Secondary | ICD-10-CM | POA: Diagnosis not present

## 2019-02-28 DIAGNOSIS — Z9181 History of falling: Secondary | ICD-10-CM | POA: Diagnosis not present

## 2019-03-02 DIAGNOSIS — R2689 Other abnormalities of gait and mobility: Secondary | ICD-10-CM | POA: Diagnosis not present

## 2019-03-02 DIAGNOSIS — M62561 Muscle wasting and atrophy, not elsewhere classified, right lower leg: Secondary | ICD-10-CM | POA: Diagnosis not present

## 2019-03-02 DIAGNOSIS — M25511 Pain in right shoulder: Secondary | ICD-10-CM | POA: Diagnosis not present

## 2019-03-02 DIAGNOSIS — M62562 Muscle wasting and atrophy, not elsewhere classified, left lower leg: Secondary | ICD-10-CM | POA: Diagnosis not present

## 2019-03-02 DIAGNOSIS — R278 Other lack of coordination: Secondary | ICD-10-CM | POA: Diagnosis not present

## 2019-03-02 DIAGNOSIS — Z9181 History of falling: Secondary | ICD-10-CM | POA: Diagnosis not present

## 2019-03-06 ENCOUNTER — Non-Acute Institutional Stay (SKILLED_NURSING_FACILITY): Payer: Medicare Other | Admitting: Adult Health

## 2019-03-06 ENCOUNTER — Encounter: Payer: Self-pay | Admitting: Adult Health

## 2019-03-06 DIAGNOSIS — Z20828 Contact with and (suspected) exposure to other viral communicable diseases: Secondary | ICD-10-CM | POA: Diagnosis not present

## 2019-03-06 DIAGNOSIS — I4821 Permanent atrial fibrillation: Secondary | ICD-10-CM

## 2019-03-06 DIAGNOSIS — Z7901 Long term (current) use of anticoagulants: Secondary | ICD-10-CM

## 2019-03-06 DIAGNOSIS — Z9189 Other specified personal risk factors, not elsewhere classified: Secondary | ICD-10-CM | POA: Diagnosis not present

## 2019-03-06 DIAGNOSIS — I4891 Unspecified atrial fibrillation: Secondary | ICD-10-CM | POA: Diagnosis not present

## 2019-03-06 NOTE — Progress Notes (Signed)
Location:  Occupational psychologist of Service:  SNF (31) Provider:   Cindi Carbon, ANP [place] [phone]   [name], DO  Patient Care Team: [name], DO as PCP - General (Geriatric Medicine) Sanda Klein, MD as PCP - Cardiology (Cardiology)  Extended Emergency Contact Information Primary Emergency Contact: [name] of [place] Phone: [phone] Work Phone: [phone] Mobile Phone: [phone] Relation: Daughter Secondary Emergency Contact: Reianna, [place] Work Phone: [phone] Mobile Phone: [phone] Relation: Son  Code Status:  DNR Goals of care: Advanced Directive information Advanced Directives 01/10/2019  Does Patient Have a Medical Advance Directive? Yes  Type of Paramedic of [place];Living will;Out of facility DNR (pink MOST or yellow form)  Does patient want to make changes to medical advance directive? No - Patient declined  Copy of [place] in Chart? Yes - validated most recent copy scanned in chart (See row information)  Would patient like information on creating a medical advance directive? -  Pre-existing out of facility DNR order (yellow form or pink MOST form) Yellow form placed in chart (order not valid for inpatient use)     Chief Complaint  Patient presents with  . Acute Visit    INR    HPI:  Pt is a 86 y.o. female seen today for an acute visit for coumadin management.  PT/INR 03/06/2019 reported 34.4/3.4 Patient denies any excessive bleeding such as nose bleeds or hematuria. She has chronic excessive bruising to her arms and legs that is unchanged. Reports that her diet is the same and feels well.   Past Medical History:  Diagnosis Date  . Arterial leg ulcer ([facility]) 10/14/2018  . Breast cancer ([facility])    left   . Centrilobular emphysema ([place]) 12/23/2017  . Chronic anticoagulation   . Chronic atrial fibrillation ([facility])   .  Chronic respiratory failure with hypoxia ([facility]) 03/26/2018  . CKD (chronic kidney disease), stage III   . Cor pulmonale, chronic ([facility]) 06/13/2013   Pulmonary artery pressure 72 06/15/17 with severe tricuspid regurg  . Essential hypertension 06/13/2013  . Hypertension   . PAH (pulmonary artery hypertension) ([facility]) 03/25/2017   Pulmonary artery pressure 72 06/15/17 with severe tricuspid regurg  . Pelvic fracture ([facility]) 05/2016  . Permanent atrial fibrillation ([facility]) 03/26/2018   Past Surgical History:  Procedure Laterality Date  . ABDOMINAL HYSTERECTOMY    . BREAST LUMPECTOMY      Allergies  Allergen Reactions  . Aspirin     Upset stomach  . Codeine Nausea Only    Nausea     Outpatient Encounter Medications as of 03/06/2019  Medication Sig  . warfarin (COUMADIN) 2 MG tablet Take 1 tablet (2 mg total) by mouth daily at 6 PM.  . acetaminophen (TYLENOL) 325 MG tablet Take 650 mg by mouth 2 (two) times daily.  Marland Kitchen allopurinol (ZYLOPRIM) 100 MG tablet Take 100 mg by mouth at bedtime.   . ALPRAZolam (XANAX) 0.5 MG tablet Take 1 tablet (0.5 mg total) by mouth at bedtime.  . Amino Acids-Protein Hydrolys (FEEDING SUPPLEMENT, PRO-STAT SUGAR FREE 64,) LIQD 1 fl oz by mouth daily  . antiseptic oral rinse (BIOTENE) LIQD 15 mLs by Mouth Rinse route as needed for dry mouth.  . benzonatate (TESSALON) 100 MG capsule Take 100 mg by mouth 3 (three) times daily as needed for cough.  . Calcium Carbonate-Vitamin D3 (CALCIUM 600-D) 600-400 MG-UNIT TABS Take 1 tablet by mouth daily.  Marland Kitchen  dextromethorphan (DELSYM) 30 MG/5ML liquid Take 5 mLs by mouth every 4 (four) hours as needed for cough.   . diclofenac sodium (VOLTAREN) 1 % GEL Apply 2 g topically 4 (four) times daily. Right shoulder  . feeding supplement (BOOST HIGH PROTEIN) LIQD Take 1 Container by mouth daily.   Marland Kitchen HYDROcodone-acetaminophen (NORCO) 5-325 MG tablet Take 1 tablet by mouth every 6 (six) hours as needed for severe pain.  Marland Kitchen ipratropium (ATROVENT) 0.02  % nebulizer solution Take 2.5 mLs (0.5 mg total) by nebulization 3 (three) times daily.  Marland Kitchen levalbuterol (XOPENEX) 1.25 MG/0.5ML nebulizer solution Take 1.25 mg by nebulization 3 (three) times daily.  Marland Kitchen levalbuterol (XOPENEX) 1.25 MG/3ML nebulizer solution Take 1.25 mg by nebulization every 6 (six) hours as needed for wheezing or shortness of breath. In addition to three times daily schedule  . loratadine (CLARITIN) 10 MG tablet Take 10 mg by mouth at bedtime.  . metoprolol succinate (TOPROL-XL) 25 MG 24 hr tablet Take 12.5 mg by mouth daily.  . mineral oil-hydrophilic petrolatum (AQUAPHOR) ointment Apply 1 application topically 2 (two) times daily. Apply to face/chest, and both upper arms  . OXYGEN Level 3 or 3 mL/minute when out of room every shift 1, 2, and 3  . Polyethyl Glyc-Propyl Glyc PF (SYSTANE PRESERVATIVE FREE) 0.4-0.3 % SOLN Apply 1 drop to eye 3 (three) times daily as needed (dry eyes).  . polyethylene glycol (MIRALAX / GLYCOLAX) 17 g packet Take 17 g by mouth daily.   Marland Kitchen spironolactone (ALDACTONE) 25 MG tablet Take 12.5 mg by mouth daily.  Marland Kitchen torsemide (DEMADEX) 20 MG tablet Take 40 mg by mouth daily.   Marland Kitchen triamcinolone cream (KENALOG) 0.1 % Apply 1 application topically as needed. To right arm for itching   No facility-administered encounter medications on file as of 03/06/2019.    Review of Systems  Skin: Positive for wound. Negative for color change, pallor and rash.  Hematological: Bruises/bleeds easily.    Immunization History  Administered Date(s) Administered  . Influenza Split 12/07/2014  . Influenza, High Dose Seasonal PF 12/15/2018  . Influenza-Unspecified 11/05/2013, 12/02/2016, 12/16/2017  . Pneumococcal Polysaccharide-23 03/02/2012  . Tdap 03/02/2012  . Zoster 03/03/2011   Pertinent  Health Maintenance Due  Topic Date Due  . PNA vac Low Risk Adult (2 of 2 - PCV13) 03/02/2013  . INFLUENZA VACCINE  Completed  . DEXA SCAN  Completed   Fall Risk  01/13/2019  Risk  for fall due to : History of fall(s);Impaired balance/gait;Impaired mobility;Medication side effect;Mental status change  Follow up Falls evaluation completed;Education provided;Falls prevention discussed  Comment done at [facility] as part of care plan   Functional Status Survey:    There were no vitals filed for this visit. There is no height or weight on file to calculate BMI. Physical Exam Neurological:     Mental Status: She is alert.     Labs reviewed: Recent Labs    03/14/18 1407 03/14/18 1407 03/15/18 0820 10/31/18 0000 11/01/18 0600 11/14/18 0000  NA 139   < > 135 136* 136* 143  K 4.5  --  4.2 4.5 4.5 4.0  CL 96*  --  97*  --   --   --   CO2 27  --  26  --   --   --   GLUCOSE 136*  --  141*  --   --   --   BUN 45*   < > 51* 61* 61* 49*  CREATININE 1.23*   < >  1.05* 1.2* 1.2* 1.1  CALCIUM 9.6  --  8.9  --   --   --    < > = values in this interval not displayed.   Recent Labs    03/15/18 0820  AST 31  ALT 20  ALKPHOS 81  BILITOT 1.2  PROT 6.3*  ALBUMIN 3.4*   Recent Labs    03/14/18 1407 03/15/18 0820 03/21/18 0300 11/01/18 0000 11/01/18 0600  WBC 10.9* 10.5 10.9 5.9 5.9  NEUTROABS 7.9*  --   --   --   --   HGB 15.3* 13.5 14.5 12.8 12.8  HCT 47.4* 40.6 43 38 38  MCV 111.0* 109.1*  --   --   --   PLT 164 146* 205 167 167   Lab Results  Component Value Date   TSH 1.602 11/18/2017   No results found for: HGBA1C No results found for: CHOL, HDL, LDLCALC, LDLDIRECT, TRIG, CHOLHDL  Significant Diagnostic Results in last 30 days:  No results found.  Assessment/Plan 1. Permanent atrial fibrillation (HCC) Rate is controlled with her current regimen  2. Long term (current) use of anticoagulants Remains on coumadin for CVA risk reduction Hold 03/06/2019 ON 1/5 Begin Coumadin 2 mg Tues, Thurs, Sat, and Sun, 1 mg M, W, F    Family/ staff Communication: discussed with the patient  Labs/tests ordered:  INR 1/8

## 2019-03-07 DIAGNOSIS — Z23 Encounter for immunization: Secondary | ICD-10-CM | POA: Diagnosis not present

## 2019-03-10 ENCOUNTER — Telehealth: Payer: Self-pay

## 2019-03-10 ENCOUNTER — Encounter: Payer: Self-pay | Admitting: Internal Medicine

## 2019-03-10 DIAGNOSIS — I4891 Unspecified atrial fibrillation: Secondary | ICD-10-CM | POA: Diagnosis not present

## 2019-03-10 DIAGNOSIS — I4821 Permanent atrial fibrillation: Secondary | ICD-10-CM | POA: Diagnosis not present

## 2019-03-10 LAB — PROTIME-INR: INR: 2.2 — AB (ref 0.9–1.1)

## 2019-03-10 NOTE — Telephone Encounter (Signed)
Erin From [facility] Faxed over  INR results, which were: Currently 1 mg M, W, F                  2 mg, Su, Tu, Th, Sat  Previously: 2 mg daily  Dr. Magdalene Molly order were to  1. Continue same dose coumadin 1 mg M.W/F and 46m Sun, Tu, Th , Sat 2. Recheck INR in 2 weeks   Faxed was sent back to [facility] 03/10/19 _0 :20pm by [facility] Patient

## 2019-03-13 DIAGNOSIS — Z7901 Long term (current) use of anticoagulants: Secondary | ICD-10-CM | POA: Diagnosis not present

## 2019-03-13 DIAGNOSIS — I482 Chronic atrial fibrillation, unspecified: Secondary | ICD-10-CM | POA: Diagnosis not present

## 2019-03-16 DIAGNOSIS — M62561 Muscle wasting and atrophy, not elsewhere classified, right lower leg: Secondary | ICD-10-CM | POA: Diagnosis not present

## 2019-03-16 DIAGNOSIS — M62562 Muscle wasting and atrophy, not elsewhere classified, left lower leg: Secondary | ICD-10-CM | POA: Diagnosis not present

## 2019-03-16 DIAGNOSIS — M25511 Pain in right shoulder: Secondary | ICD-10-CM | POA: Diagnosis not present

## 2019-03-16 DIAGNOSIS — N183 Chronic kidney disease, stage 3 unspecified: Secondary | ICD-10-CM | POA: Diagnosis not present

## 2019-03-16 DIAGNOSIS — R278 Other lack of coordination: Secondary | ICD-10-CM | POA: Diagnosis not present

## 2019-03-16 DIAGNOSIS — I5032 Chronic diastolic (congestive) heart failure: Secondary | ICD-10-CM | POA: Diagnosis not present

## 2019-03-16 DIAGNOSIS — R2689 Other abnormalities of gait and mobility: Secondary | ICD-10-CM | POA: Diagnosis not present

## 2019-03-16 DIAGNOSIS — Z9181 History of falling: Secondary | ICD-10-CM | POA: Diagnosis not present

## 2019-03-16 DIAGNOSIS — J441 Chronic obstructive pulmonary disease with (acute) exacerbation: Secondary | ICD-10-CM | POA: Diagnosis not present

## 2019-03-17 DIAGNOSIS — R278 Other lack of coordination: Secondary | ICD-10-CM | POA: Diagnosis not present

## 2019-03-17 DIAGNOSIS — Z9181 History of falling: Secondary | ICD-10-CM | POA: Diagnosis not present

## 2019-03-17 DIAGNOSIS — R2689 Other abnormalities of gait and mobility: Secondary | ICD-10-CM | POA: Diagnosis not present

## 2019-03-17 DIAGNOSIS — M62562 Muscle wasting and atrophy, not elsewhere classified, left lower leg: Secondary | ICD-10-CM | POA: Diagnosis not present

## 2019-03-17 DIAGNOSIS — M25511 Pain in right shoulder: Secondary | ICD-10-CM | POA: Diagnosis not present

## 2019-03-17 DIAGNOSIS — M62561 Muscle wasting and atrophy, not elsewhere classified, right lower leg: Secondary | ICD-10-CM | POA: Diagnosis not present

## 2019-03-20 ENCOUNTER — Encounter: Payer: Self-pay | Admitting: Adult Health

## 2019-03-20 ENCOUNTER — Non-Acute Institutional Stay (SKILLED_NURSING_FACILITY): Payer: Medicare Other | Admitting: Adult Health

## 2019-03-20 DIAGNOSIS — I4891 Unspecified atrial fibrillation: Secondary | ICD-10-CM

## 2019-03-20 DIAGNOSIS — Z9189 Other specified personal risk factors, not elsewhere classified: Secondary | ICD-10-CM | POA: Diagnosis not present

## 2019-03-20 DIAGNOSIS — Z20828 Contact with and (suspected) exposure to other viral communicable diseases: Secondary | ICD-10-CM | POA: Diagnosis not present

## 2019-03-20 DIAGNOSIS — D6869 Other thrombophilia: Secondary | ICD-10-CM | POA: Diagnosis not present

## 2019-03-20 DIAGNOSIS — Z7901 Long term (current) use of anticoagulants: Secondary | ICD-10-CM | POA: Diagnosis not present

## 2019-03-20 LAB — PROTIME-INR: Protime: 24 — AB (ref 10.0–13.8)

## 2019-03-20 LAB — POCT INR: INR: 2.3 — AB (ref 0.9–1.1)

## 2019-03-20 NOTE — Progress Notes (Signed)
Location:  Occupational psychologist of Service:  SNF (31) Provider:   Cindi Carbon, ANP [place] [phone]   [name], DO  Patient Care Team: [name], DO as PCP - General (Geriatric Medicine) Sanda Klein, MD as PCP - Cardiology (Cardiology)  Extended Emergency Contact Information Primary Emergency Contact: [name] of [place] Phone: [phone] Work Phone: [phone] Mobile Phone: [phone] Relation: Daughter Secondary Emergency Contact: Nyasia, [place] Work Phone: [phone] Mobile Phone: [phone] Relation: Son  Code Status:  DNR Goals of care: Advanced Directive information Advanced Directives 01/10/2019  Does Patient Have a Medical Advance Directive? Yes  Type of Paramedic of [place];Living will;Out of facility DNR (pink MOST or yellow form)  Does patient want to make changes to medical advance directive? No - Patient declined  Copy of [place] in Chart? Yes - validated most recent copy scanned in chart (See row information)  Would patient like information on creating a medical advance directive? -  Pre-existing out of facility DNR order (yellow form or pink MOST form) Yellow form placed in chart (order not valid for inpatient use)     Chief Complaint  Patient presents with  . Acute Visit    coumadin management     HPI:  Pt is a 86 y.o. female seen today for coumadin management. She is on therapy due to a hx of afib. She denies any frequent bruising or bleeding episodes. No complaints from the nursing staff.  CHA2DS2 VASc: 6   Lab Results  Component Value Date   INR 2.3 (A) 03/20/2019   INR 2.0 (A) 12/30/2018   INR 2.0 (A) 12/16/2018   PROTIME 24.0 (A) 03/20/2019   PROTIME 23.2 (A) 12/30/2018   PROTIME 22.6 (A) 12/16/2018      Past Medical History:  Diagnosis Date  . Arterial leg ulcer ([place]) 10/14/2018  . Breast cancer  ([place])    left   . Centrilobular emphysema ([place]) 12/23/2017  . Chronic anticoagulation   . Chronic atrial fibrillation ([place])   . Chronic respiratory failure with hypoxia ([place]) 03/26/2018  . CKD (chronic kidney disease), stage III   . Cor pulmonale, chronic ([place]) 06/13/2013   Pulmonary artery pressure 72 06/15/17 with severe tricuspid regurg  . Essential hypertension 06/13/2013  . Hypertension   . PAH (pulmonary artery hypertension) ([place]) 03/25/2017   Pulmonary artery pressure 72 06/15/17 with severe tricuspid regurg  . Pelvic fracture ([place]) 05/2016  . Permanent atrial fibrillation ([place]) 03/26/2018   Past Surgical History:  Procedure Laterality Date  . ABDOMINAL HYSTERECTOMY    . BREAST LUMPECTOMY      Allergies  Allergen Reactions  . Aspirin     Upset stomach  . Codeine Nausea Only    Nausea     Outpatient Encounter Medications as of 03/20/2019  Medication Sig  . warfarin (COUMADIN) 1 MG tablet Take 1 mg by mouth every other day. M, W, F  . acetaminophen (TYLENOL) 325 MG tablet Take 650 mg by mouth 2 (two) times daily.  Marland Kitchen allopurinol (ZYLOPRIM) 100 MG tablet Take 100 mg by mouth at bedtime.   . ALPRAZolam (XANAX) 0.5 MG tablet Take 1 tablet (0.5 mg total) by mouth at bedtime.  . Amino Acids-Protein Hydrolys (FEEDING SUPPLEMENT, PRO-STAT SUGAR FREE 64,) LIQD 1 fl oz by mouth daily  . antiseptic oral rinse (BIOTENE) LIQD 15 mLs by Mouth Rinse route as needed for dry mouth.  . benzonatate (TESSALON)  100 MG capsule Take 100 mg by mouth 3 (three) times daily as needed for cough.  . Calcium Carbonate-Vitamin D3 (CALCIUM 600-D) 600-400 MG-UNIT TABS Take 1 tablet by mouth daily.  Marland Kitchen dextromethorphan (DELSYM) 30 MG/5ML liquid Take 5 mLs by mouth every 4 (four) hours as needed for cough.   . diclofenac sodium (VOLTAREN) 1 % GEL Apply 2 g topically 4 (four) times daily. Right shoulder  . feeding supplement (BOOST HIGH PROTEIN) LIQD Take 1 Container by mouth daily.   Marland Kitchen HYDROcodone-acetaminophen  (NORCO) 5-325 MG tablet Take 1 tablet by mouth every 6 (six) hours as needed for severe pain.  Marland Kitchen ipratropium (ATROVENT) 0.02 % nebulizer solution Take 2.5 mLs (0.5 mg total) by nebulization 3 (three) times daily.  Marland Kitchen levalbuterol (XOPENEX) 1.25 MG/0.5ML nebulizer solution Take 1.25 mg by nebulization 3 (three) times daily.  Marland Kitchen levalbuterol (XOPENEX) 1.25 MG/3ML nebulizer solution Take 1.25 mg by nebulization every 6 (six) hours as needed for wheezing or shortness of breath. In addition to three times daily schedule  . loratadine (CLARITIN) 10 MG tablet Take 10 mg by mouth at bedtime.  . metoprolol succinate (TOPROL-XL) 25 MG 24 hr tablet Take 12.5 mg by mouth daily.  . mineral oil-hydrophilic petrolatum (AQUAPHOR) ointment Apply 1 application topically 2 (two) times daily. Apply to face/chest, and both upper arms  . OXYGEN Level 3 or 3 mL/minute when out of room every shift 1, 2, and 3  . Polyethyl Glyc-Propyl Glyc PF (SYSTANE PRESERVATIVE FREE) 0.4-0.3 % SOLN Apply 1 drop to eye 3 (three) times daily as needed (dry eyes).  . polyethylene glycol (MIRALAX / GLYCOLAX) 17 g packet Take 17 g by mouth daily.   Marland Kitchen spironolactone (ALDACTONE) 25 MG tablet Take 12.5 mg by mouth daily.  Marland Kitchen torsemide (DEMADEX) 20 MG tablet Take 40 mg by mouth daily.   Marland Kitchen triamcinolone cream (KENALOG) 0.1 % Apply 1 application topically as needed. To right arm for itching  . warfarin (COUMADIN) 2 MG tablet Take 1 tablet (2 mg total) by mouth daily at 6 PM. (Patient taking differently: Take 2 mg by mouth every other day. Lydia Guiles, Thurs, Sat)   No facility-administered encounter medications on file as of 03/20/2019.    Review of Systems  Denies excessive bleeding or bruising .  Immunization History  Administered Date(s) Administered  . Influenza Split 12/07/2014  . Influenza, High Dose Seasonal PF 12/15/2018  . Influenza-Unspecified 11/05/2013, 12/02/2016, 12/16/2017  . Moderna SARS-COVID-2 Vaccination 03/07/2019  .  Pneumococcal Polysaccharide-23 03/02/2012  . Tdap 03/02/2012  . Zoster 03/03/2011   Pertinent  Health Maintenance Due  Topic Date Due  . PNA vac Low Risk Adult (2 of 2 - PCV13) 03/02/2013  . INFLUENZA VACCINE  Completed  . DEXA SCAN  Completed   Fall Risk  01/13/2019  Risk for fall due to : History of fall(s);Impaired balance/gait;Impaired mobility;Medication side effect;Mental status change  Follow up Falls evaluation completed;Education provided;Falls prevention discussed  Comment done at [facility] as part of care plan   Functional Status Survey:    There were no vitals filed for this visit. There is no height or weight on file to calculate BMI. Physical Exam Constitutional:      Appearance: Normal appearance.  Neurological:     Mental Status: She is alert.     Labs reviewed: Recent Labs    10/31/18 0000 11/01/18 0600 11/14/18 0000  NA 136* 136* 143  K 4.5 4.5 4.0  BUN 61* 61* 49*  CREATININE 1.2* 1.2*  1.1   No results for input(s): AST, ALT, ALKPHOS, BILITOT, PROT, ALBUMIN in the last 8760 hours. Recent Labs    03/21/18 0300 11/01/18 0000 11/01/18 0600  WBC 10.9 5.9 5.9  HGB 14.5 12.8 12.8  HCT 43 38 38  PLT 205 167 167   Lab Results  Component Value Date   TSH 1.602 11/18/2017   No results found for: HGBA1C No results found for: CHOL, HDL, LDLCALC, LDLDIRECT, TRIG, CHOLHDL  Significant Diagnostic Results in last 30 days:  No results found.  Assessment/Plan 1. Hypercoagulable state due to atrial fibrillation (HCC) Rate is controlled.  Continue coumadin Warfarin 85m M, W, and F and 2 mg Sun, Tues, Thurs, and Sat Recheck INR in 2 weeks   2. Current use of long term anticoagulation As above    Family/ staff Communication: resident and staff  Labs/tests ordered:  INR 2 weeks

## 2019-03-23 DIAGNOSIS — M62562 Muscle wasting and atrophy, not elsewhere classified, left lower leg: Secondary | ICD-10-CM | POA: Diagnosis not present

## 2019-03-23 DIAGNOSIS — Z9181 History of falling: Secondary | ICD-10-CM | POA: Diagnosis not present

## 2019-03-23 DIAGNOSIS — M25511 Pain in right shoulder: Secondary | ICD-10-CM | POA: Diagnosis not present

## 2019-03-23 DIAGNOSIS — R278 Other lack of coordination: Secondary | ICD-10-CM | POA: Diagnosis not present

## 2019-03-23 DIAGNOSIS — M62561 Muscle wasting and atrophy, not elsewhere classified, right lower leg: Secondary | ICD-10-CM | POA: Diagnosis not present

## 2019-03-23 DIAGNOSIS — R2689 Other abnormalities of gait and mobility: Secondary | ICD-10-CM | POA: Diagnosis not present

## 2019-03-27 DIAGNOSIS — Z9189 Other specified personal risk factors, not elsewhere classified: Secondary | ICD-10-CM | POA: Diagnosis not present

## 2019-03-27 DIAGNOSIS — Z20828 Contact with and (suspected) exposure to other viral communicable diseases: Secondary | ICD-10-CM | POA: Diagnosis not present

## 2019-03-30 ENCOUNTER — Encounter: Payer: Self-pay | Admitting: Adult Health

## 2019-03-30 ENCOUNTER — Non-Acute Institutional Stay (SKILLED_NURSING_FACILITY): Payer: Medicare Other | Admitting: Adult Health

## 2019-03-30 DIAGNOSIS — I2781 Cor pulmonale (chronic): Secondary | ICD-10-CM

## 2019-03-30 DIAGNOSIS — I2721 Secondary pulmonary arterial hypertension: Secondary | ICD-10-CM | POA: Diagnosis not present

## 2019-03-30 DIAGNOSIS — K5909 Other constipation: Secondary | ICD-10-CM

## 2019-03-30 DIAGNOSIS — Z0000 Encounter for general adult medical examination without abnormal findings: Secondary | ICD-10-CM

## 2019-03-30 DIAGNOSIS — J9611 Chronic respiratory failure with hypoxia: Secondary | ICD-10-CM | POA: Diagnosis not present

## 2019-03-30 DIAGNOSIS — R300 Dysuria: Secondary | ICD-10-CM | POA: Diagnosis not present

## 2019-03-30 DIAGNOSIS — J432 Centrilobular emphysema: Secondary | ICD-10-CM

## 2019-03-30 DIAGNOSIS — I4821 Permanent atrial fibrillation: Secondary | ICD-10-CM | POA: Diagnosis not present

## 2019-03-30 NOTE — Progress Notes (Signed)
Subjective:   [name] is a 86 y.o. female who presents for Medicare Annual (Subsequent) preventive examination at [facility] skilled care.   Review of Systems:   Cardiac Risk Factors include: advanced age (>71mn, >>62women);sedentary lifestyle;hypertension;smoking/ tobacco exposure     Objective:     Vitals: Wt 144 lb 9.6 oz (65.6 kg)   BMI 24.82 kg/m   Body mass index is 24.82 kg/m.  Advanced Directives 03/30/2019 01/10/2019 03/29/2018 03/22/2018 03/14/2018 11/18/2017 07/10/2016  Does Patient Have a Medical Advance Directive? No _0  Yes  Type of Advance Directive - [place];Out of facility DNR (pink MOST or yellow form) [place] will [place] will;Out of facility DNR (pink MOST or yellow form) [place] Out of facility DNR (pink MOST or yellow form) Living will;Healthcare Power of Attorney  Does patient want to make changes to medical advance directive? - No - Patient declined No - Patient declined No - Patient declined No - Patient declined No - Patient declined -  Copy of [place] Chart? - Yes - validated most recent copy scanned in chart (See row information) Yes - validated most recent copy scanned in chart (See row information) Yes - validated most recent copy scanned in chart (See row information) No - copy requested - Yes  Would patient like information on creating a medical advance directive? No - Patient declined - No - Patient declined No - Patient declined No - Patient declined - -  Pre-existing out of facility DNR order (yellow form or pink MOST form) - Yellow form placed in chart (order not valid for inpatient use) - Yellow form placed in chart (order not valid for inpatient use) - - -    Tobacco Social History   Tobacco Use  Smoking Status Former Smoker  . Packs/day: 0.20  . Years: 5.00  . Pack years: 1.00    . Types: Cigarettes  Smokeless Tobacco Never Used  Tobacco Comment   pt has not smoked in 50 years     Counseling given: Not Answered Comment: pt has not smoked in 50 years   Clinical Intake:  Pre-visit preparation completed: No  Pain : 0-10 Pain Score: 9  Pain Type: Chronic pain Pain Location: Arm Pain Orientation: Right Pain Radiating Towards: no Pain Descriptors / Indicators: Aching Pain Onset: More than a month ago Pain Frequency: Occasional Pain Relieving Factors: tylenol and xanax Effect of Pain on Daily Activities: yes  Pain Relieving Factors: tylenol and xanax  BMI - recorded: 24.8 Nutritional Status: BMI of 19-24  Normal Nutritional Risks: Non-healing wound Diabetes: No  How often do you need to have someone help you when you read instructions, pamphlets, or other written materials from your doctor or pharmacy?: 3 - Sometimes 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 Arterial leg ulcer ([place] 10/14/2018  . Breast cancer ([place]    left   . Centrilobular emphysema ([place] 12/23/2017  . Chronic anticoagulation   . Chronic atrial fibrillation ([place]   . Chronic respiratory failure with hypoxia ([place] 03/26/2018  . CKD (chronic kidney disease), stage III   . Cor pulmonale, chronic ([place] 06/13/2013   Pulmonary artery pressure 72 06/15/17 with severe tricuspid regurg  . Essential hypertension 06/13/2013  . Hypertension   .  PAH (pulmonary artery hypertension) ([place]) 03/25/2017   Pulmonary artery pressure 72 06/15/17 with severe tricuspid regurg  . Pelvic fracture ([place]) 05/2016  . Permanent atrial fibrillation ([place]) 03/26/2018   Past Surgical History:  Procedure Laterality Date  . ABDOMINAL HYSTERECTOMY    . BREAST LUMPECTOMY     Family History  Problem Relation Age of Onset  . Heart disease Mother   . Ulcers Father    . Colon cancer Father   . Asthma Sister   . Asthma Son    Social History   Socioeconomic History  . Marital status: Married    Spouse name: Not on file  . Number of children: Not on file  . Years of education: Not on file  . Highest education level: Not on file  Occupational History  . Not on file  Tobacco Use  . Smoking status: Former Smoker    Packs/day: 0.20    Years: 5.00    Pack years: 1.00    Types: Cigarettes  . Smokeless tobacco: Never Used  . Tobacco comment: pt has not smoked in 50 years  Substance and Sexual Activity  . Alcohol use: Yes    Alcohol/week: 2.0 standard drinks    Types: 2 Glasses of wine per week  . Drug use: No  . Sexual activity: Not on file  Other Topics Concern  . Not on file  Social History Narrative  . Not on file   Social Determinants of Health   Financial Resource Strain:   . Difficulty of Paying Living Expenses: Not on file  Food Insecurity:   . Worried About Charity fundraiser in the Last Year: Not on file  . Ran Out of Food in the Last Year: Not on file  Transportation Needs:   . Lack of Transportation (Medical): Not on file  . Lack of Transportation (Non-Medical): Not on file  Physical Activity:   . Days of Exercise per Week: Not on file  . Minutes of Exercise per Session: Not on file  Stress:   . Feeling of Stress : Not on file  Social Connections:   . Frequency of Communication with Friends and Family: Not on file  . Frequency of Social Gatherings with Friends and Family: Not on file  . Attends Religious Services: Not on file  . Active Member of Clubs or Organizations: Not on file  . Attends Archivist Meetings: Not on file  . Marital Status: Not on file    Outpatient Encounter Medications as of 03/30/2019  Medication Sig  . acetaminophen (TYLENOL) 325 MG tablet Take 650 mg by mouth 2 (two) times daily.  Marland Kitchen allopurinol (ZYLOPRIM) 100 MG tablet Take 100 mg by mouth at bedtime.   . ALPRAZolam (XANAX) 0.5 MG  tablet Take 1 tablet (0.5 mg total) by mouth at bedtime.  . Amino Acids-Protein Hydrolys (FEEDING SUPPLEMENT, PRO-STAT SUGAR FREE 64,) LIQD 1 fl oz by mouth daily  . antiseptic oral rinse (BIOTENE) LIQD 15 mLs by Mouth Rinse route as needed for dry mouth.  . benzonatate (TESSALON) 100 MG capsule Take 100 mg by mouth 3 (three) times daily as needed for cough.  . Calcium Carbonate-Vitamin D3 (CALCIUM 600-D) 600-400 MG-UNIT TABS Take 1 tablet by mouth daily.  Marland Kitchen dextromethorphan (DELSYM) 30 MG/5ML liquid Take 5 mLs by mouth every 4 (four) hours as needed for cough.   . diclofenac sodium (VOLTAREN) 1 % GEL Apply 2 g topically 4 (four) times daily. Right shoulder  . feeding supplement (BOOST  HIGH PROTEIN) LIQD Take 1 Container by mouth daily.   Marland Kitchen HYDROcodone-acetaminophen (NORCO) 5-325 MG tablet Take 1 tablet by mouth every 6 (six) hours as needed for severe pain.  Marland Kitchen ipratropium (ATROVENT) 0.02 % nebulizer solution Take 2.5 mLs (0.5 mg total) by nebulization 3 (three) times daily.  Marland Kitchen levalbuterol (XOPENEX) 1.25 MG/0.5ML nebulizer solution Take 1.25 mg by nebulization 3 (three) times daily.  Marland Kitchen levalbuterol (XOPENEX) 1.25 MG/3ML nebulizer solution Take 1.25 mg by nebulization every 6 (six) hours as needed for wheezing or shortness of breath. In addition to three times daily schedule  . loratadine (CLARITIN) 10 MG tablet Take 10 mg by mouth at bedtime.  . metoprolol succinate (TOPROL-XL) 25 MG 24 hr tablet Take 12.5 mg by mouth daily.  . mineral oil-hydrophilic petrolatum (AQUAPHOR) ointment Apply 1 application topically 2 (two) times daily. Apply to face/chest, and both upper arms  . OXYGEN Level 3 or 3 mL/minute when out of room every shift 1, 2, and 3  . Polyethyl Glyc-Propyl Glyc PF (SYSTANE PRESERVATIVE FREE) 0.4-0.3 % SOLN Apply 1 drop to eye 3 (three) times daily as needed (dry eyes).  . polyethylene glycol (MIRALAX / GLYCOLAX) 17 g packet Take 17 g by mouth daily.   Marland Kitchen spironolactone (ALDACTONE) 25  MG tablet Take 12.5 mg by mouth daily.  Marland Kitchen torsemide (DEMADEX) 20 MG tablet Take 40 mg by mouth daily.   Marland Kitchen triamcinolone cream (KENALOG) 0.1 % Apply 1 application topically as needed. To right arm for itching  . warfarin (COUMADIN) 1 MG tablet Take 1 mg by mouth every other day. M, W, F  . warfarin (COUMADIN) 2 MG tablet Take 1 tablet (2 mg total) by mouth daily at 6 PM. (Patient taking differently: Take 2 mg by mouth every other day. Lydia Guiles, Thurs, Sat)   No facility-administered encounter medications on file as of 03/30/2019.    Activities of Daily Living In your present state of health, do you have any difficulty performing the following activities: 03/30/2019  Hearing? Y  Vision? Y  Difficulty concentrating or making decisions? Y  Walking or climbing stairs? Y  Dressing or bathing? Y  Doing errands, shopping? Y  Preparing Food and eating ? N  Using the Toilet? Y  In the past six months, have you accidently leaked urine? N  Do you have problems with loss of bowel control? N  Managing your Medications? Y  Managing your Finances? Y  Housekeeping or managing your Housekeeping? Y  Some recent data might be hidden    Patient Care Team: [name], DO as PCP - General (Geriatric Medicine) Sanda Klein, MD as PCP - Cardiology (Cardiology)    Assessment:   This is a routine wellness examination for Rudy.  Exercise Activities and Dietary recommendations Current Exercise Habits: Structured exercise class, Time (Minutes): 30, Frequency (Times/Week): 3, Weekly Exercise (Minutes/Week): 90, Intensity: Mild, Exercise limited by: orthopedic condition(s)  Goals    . Exercise 3x per week (30 min per time)       Fall Risk Fall Risk  03/30/2019 01/13/2019  Falls in the past year? 1 -  Number falls in past yr: 1 -  Injury with Fall? 1 -  Risk for fall due to : History of fall(s) History of fall(s);Impaired balance/gait;Impaired mobility;Medication side effect;Mental status  change  Follow up Falls evaluation completed Falls evaluation completed;Education provided;Falls prevention discussed  Comment - done at [facility] as part of care plan   Is the patient's home free of loose  throw rugs in walkways, pet beds, electrical cords, etc?   yes      Grab bars in the bathroom? yes      Handrails on the stairs?   yes      Adequate lighting?   yes  Timed Get Up and Go performed: not completed due to arm pain  Depression Screen PHQ 2/9 Scores 03/30/2019  PHQ - 2 Score 1     Cognitive Function MMSE - Mini Mental State Exam 03/30/2019  Orientation to time 4  Orientation to Place 5  Registration 3  Attention/ Calculation 5  Recall 2  Language- name 2 objects 2  Language- repeat 1  Language- follow 3 step command 3  Language- read & follow direction 1  Write a sentence 1  Copy design 0  Total score 27        Immunization History  Administered Date(s) Administered  . Influenza Split 12/07/2014  . Influenza, High Dose Seasonal PF 12/15/2018  . Influenza-Unspecified 11/05/2013, 12/02/2016, 12/16/2017  . Moderna SARS-COVID-2 Vaccination 03/07/2019  . Pneumococcal Polysaccharide-23 03/02/2012  . Tdap 03/02/2012  . Zoster 03/03/2011    Qualifies for Shingles Vaccine?yes  Screening Tests Health Maintenance  Topic Date Due  . PNA vac Low Risk Adult (2 of 2 - PCV13) 03/02/2013  . TETANUS/TDAP  03/02/2022  . INFLUENZA VACCINE  Completed  . DEXA SCAN  Completed    Cancer Screenings: Lung: Low Dose CT Chest recommended if Age 29-80 years, 30 pack-year currently smoking OR have quit w/in 15years. Patient does not qualify. Breast:  Up to date on Mammogram? Yes  Aged out Up to date of Bone Density/Dexa? Yes needed when pandemic improves Colorectal: aged out  Additional Screenings: not indicated: Hepatitis C Screening:      Plan:     I have personally reviewed and noted the following in the patient's chart:   . Medical and social history . Use  of alcohol, tobacco or illicit drugs  . Current medications and supplements . Functional ability and status . Nutritional status . Physical activity . Advanced directives . List of other physicians . Hospitalizations, surgeries, and ER visits in previous 12 months . Vitals . Screenings to include cognitive, depression, and falls . Referrals and appointments  In addition, I have reviewed and discussed with patient certain preventive protocols, quality metrics, and best practice recommendations. A written personalized care plan for preventive services as well as general preventive health recommendations were provided to patient.     Royal Hawthorn, NP  03/30/2019

## 2019-03-30 NOTE — Progress Notes (Signed)
Location:  [facility] psychologist of Service:  SNF (31) Provider:   Cindi Carbon, ANP [place] [phone]  [name], DO  Patient Care Team: [name], DO as PCP - General (Geriatric Medicine) Sanda Klein, MD as PCP - Cardiology (Cardiology)  Extended Emergency Contact Information Primary Emergency Contact: [name] of [place] Phone: [phone] Work Phone: [phone] Mobile Phone: [phone] Relation: Daughter Secondary Emergency Contact: [place], [place] Work Phone: [phone] Mobile Phone: [phone] Relation: Son   Code Status:  DNR Goals of care: Advanced Directive information Advanced Directives 03/30/2019  Does Patient Have a Medical Advance Directive? No  Type of Advance Directive -  Does patient want to make changes to medical advance directive? -  Copy of [place] in Chart? -  Would patient like information on creating a medical advance directive? No - Patient declined  Pre-existing out of facility DNR order (yellow form or pink MOST form) -     Chief Complaint  Patient presents with  . Medical Management of Chronic Issues    HPI:  Pt is a 86 y.o. female seen today for medical management of chronic diseases.    COPD: has chronic dry cough and DOE when ambulating to the BR or in the hallway but this is unchanged. Continues on 2-3 liters. No sputum production and no fever  Reports constipation. No response to miralax  Cor pulmonale: continue on torsemide and aldactone. Minimally edema. Weight stable Wt Readings from Last 3 Encounters:  03/30/19 144 lb 9.6 oz (65.6 kg)  03/30/19 144 lb 9.6 oz (65.6 kg)  02/03/19 142 lb 9.6 oz (64.7 kg)    Reports dysuria and frequency x 3 weeks. No fever, back pain, or bladder pain. Requesting UA C and S. Reports that she stays hydrated and drinks water regularly.   Afib: rate remains controlled. INR therapeutic with no  issues bleeding. Bruises easily.   BP runs in the 90s at times. No dizziness or weakness.    Past Medical History:  Diagnosis Date  . 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 Breast cancer ([place])    left   . Centrilobular emphysema ([place]) 12/23/2017  . Chronic anticoagulation   . Chronic atrial fibrillation ([place])   . Chronic respiratory failure with hypoxia ([place]) 03/26/2018  . CKD (chronic kidney disease), stage III   . Cor pulmonale, chronic ([place]) 06/13/2013   Pulmonary artery pressure 72 06/15/17 with severe tricuspid regurg  . Essential hypertension 06/13/2013  . Hypertension   . PAH (pulmonary artery hypertension) ([place]) 03/25/2017   Pulmonary artery pressure 72 06/15/17 with severe tricuspid regurg  . Pelvic fracture ([place]) 05/2016  . Permanent atrial fibrillation ([place]) 03/26/2018   Past Surgical History:  Procedure Laterality Date  . ABDOMINAL HYSTERECTOMY    . BREAST LUMPECTOMY      Allergies  Allergen Reactions  . Aspirin     Upset stomach  . Codeine Nausea Only    Nausea     Outpatient Encounter Medications as of 03/30/2019  Medication Sig  . acetaminophen (TYLENOL) 325 MG tablet Take 650 mg by mouth 2 (two) times daily.  Marland Kitchen allopurinol (ZYLOPRIM) 100 MG tablet Take 100 mg by mouth at bedtime.   . ALPRAZolam (XANAX) 0.5 MG tablet Take 1 tablet (0.5 mg total) by mouth at bedtime.  . Amino Acids-Protein Hydrolys (FEEDING SUPPLEMENT, PRO-STAT SUGAR FREE 64,) LIQD 1 fl oz by mouth daily  . antiseptic oral rinse (BIOTENE) LIQD  15 mLs by Mouth Rinse route as needed for dry mouth.  . benzonatate (TESSALON) 100 MG capsule Take 100 mg by mouth 3 (three) times daily as needed for cough.  . Calcium Carbonate-Vitamin D3 (CALCIUM 600-D) 600-400 MG-UNIT TABS Take 1 tablet by mouth daily.  Marland Kitchen dextromethorphan (DELSYM) 30 MG/5ML liquid Take 5 mLs by mouth every 4 (four) hours as needed for cough.   . diclofenac sodium (VOLTAREN) 1 % GEL Apply 2 g topically 4 (four) times daily. Right  shoulder  . feeding supplement (BOOST HIGH PROTEIN) LIQD Take 1 Container by mouth daily.   Marland Kitchen HYDROcodone-acetaminophen (NORCO) 5-325 MG tablet Take 1 tablet by mouth every 6 (six) hours as needed for severe pain.  Marland Kitchen ipratropium (ATROVENT) 0.02 % nebulizer solution Take 2.5 mLs (0.5 mg total) by nebulization 3 (three) times daily.  Marland Kitchen levalbuterol (XOPENEX) 1.25 MG/0.5ML nebulizer solution Take 1.25 mg by nebulization 3 (three) times daily.  Marland Kitchen levalbuterol (XOPENEX) 1.25 MG/3ML nebulizer solution Take 1.25 mg by nebulization every 6 (six) hours as needed for wheezing or shortness of breath. In addition to three times daily schedule  . loratadine (CLARITIN) 10 MG tablet Take 10 mg by mouth at bedtime.  . metoprolol succinate (TOPROL-XL) 25 MG 24 hr tablet Take 12.5 mg by mouth daily.  . mineral oil-hydrophilic petrolatum (AQUAPHOR) ointment Apply 1 application topically 2 (two) times daily. Apply to face/chest, and both upper arms  . OXYGEN Level 3 or 3 mL/minute when out of room every shift 1, 2, and 3  . Polyethyl Glyc-Propyl Glyc PF (SYSTANE PRESERVATIVE FREE) 0.4-0.3 % SOLN Apply 1 drop to eye 3 (three) times daily as needed (dry eyes).  . polyethylene glycol (MIRALAX / GLYCOLAX) 17 g packet Take 17 g by mouth daily.   Marland Kitchen spironolactone (ALDACTONE) 25 MG tablet Take 12.5 mg by mouth daily.  Marland Kitchen torsemide (DEMADEX) 20 MG tablet Take 40 mg by mouth daily.   Marland Kitchen warfarin (COUMADIN) 1 MG tablet Take 1 mg by mouth every other day. M, W, F  . warfarin (COUMADIN) 2 MG tablet Take 1 tablet (2 mg total) by mouth daily at 6 PM. (Patient taking differently: Take 2 mg by mouth every other day. Lydia Guiles, Thurs, Sat)  . triamcinolone cream (KENALOG) 0.1 % Apply 1 application topically as needed. To right arm for itching   No facility-administered encounter medications on file as of 03/30/2019.    Review of Systems  Constitutional: Negative for activity change, appetite change, chills, diaphoresis, fatigue,  fever and unexpected weight change.  HENT: Negative for congestion.   Respiratory: Positive for cough (chronic dry) and shortness of breath (on exertion chronic). Negative for wheezing.   Cardiovascular: Positive for leg swelling. Negative for chest pain and palpitations.  Gastrointestinal: Positive for constipation. Negative for abdominal distention, abdominal pain, diarrhea, nausea and vomiting.  Genitourinary: Positive for dysuria and frequency. Negative for difficulty urinating, flank pain, genital sores, hematuria, menstrual problem, pelvic pain, urgency and vaginal discharge.  Musculoskeletal: Positive for arthralgias and gait problem. Negative for back pain, joint swelling and myalgias.  Skin: Positive for wound.  Neurological: Negative for dizziness, tremors, seizures, syncope, facial asymmetry, speech difficulty, weakness, light-headedness, numbness and headaches.  Psychiatric/Behavioral: Negative for agitation, behavioral problems and confusion.    Immunization History  Administered Date(s) Administered  . Influenza Split 12/07/2014  . Influenza, High Dose Seasonal PF 12/15/2018  . Influenza-Unspecified 11/05/2013, 12/02/2016, 12/16/2017  . Moderna SARS-COVID-2 Vaccination 03/07/2019  . Pneumococcal Polysaccharide-23 03/02/2012  . Tdap 03/02/2012  .  Zoster 03/03/2011   Pertinent  Health Maintenance Due  Topic Date Due  . PNA vac Low Risk Adult (2 of 2 - PCV13) 03/02/2013  . INFLUENZA VACCINE  Completed  . DEXA SCAN  Completed   Fall Risk  03/30/2019 01/13/2019  Falls in the past year? 1 -  Number falls in past yr: 1 -  Injury with Fall? 1 -  Risk for fall due to : History of fall(s) History of fall(s);Impaired balance/gait;Impaired mobility;Medication side effect;Mental status change  Follow up Falls evaluation completed Falls evaluation completed;Education provided;Falls prevention discussed  Comment - done at [facility] as part of care plan   Functional Status  Survey:    Vitals:   03/30/19 1359  Weight: 144 lb 9.6 oz (65.6 kg)   Body mass index is 24.82 kg/m. Physical Exam Vitals and nursing note reviewed.  Constitutional:      General: She is not in acute distress.    Appearance: She is not diaphoretic.  HENT:     Head: Normocephalic and atraumatic.     Mouth/Throat:     Mouth: Mucous membranes are moist.     Pharynx: Oropharynx is clear.  Neck:     Vascular: No JVD.  Cardiovascular:     Rate and Rhythm: Normal rate. Rhythm irregular.     Heart sounds: No murmur.  Pulmonary:     Effort: Pulmonary effort is normal. No respiratory distress.     Breath sounds: Wheezing present.  Abdominal:     General: Bowel sounds are normal. There is no distension.     Palpations: Abdomen is soft.     Tenderness: There is no abdominal tenderness. There is no right CVA tenderness or left CVA tenderness.  Musculoskeletal:     Cervical back: No rigidity.     Right lower leg: No edema.     Left lower leg: No edema.  Lymphadenopathy:     Cervical: No cervical adenopathy.  Skin:    General: Skin is warm and dry.  Neurological:     Mental Status: She is alert and oriented to person, place, and time.  Psychiatric:        Mood and Affect: Mood normal.     Labs reviewed: Recent Labs    10/31/18 0000 11/01/18 0600 11/14/18 0000  NA 136* 136* 143  K 4.5 4.5 4.0  BUN 61* 61* 49*  CREATININE 1.2* 1.2* 1.1   No results for input(s): AST, ALT, ALKPHOS, BILITOT, PROT, ALBUMIN in the last 8760 hours. Recent Labs    11/01/18 0000 11/01/18 0600  WBC 5.9 5.9  HGB 12.8 12.8  HCT 38 38  PLT 167 167   Lab Results  Component Value Date   TSH 1.602 11/18/2017   No results found for: HGBA1C No results found for: CHOL, HDL, LDLCALC, LDLDIRECT, TRIG, CHOLHDL  Significant Diagnostic Results in last 30 days:  No results found.  Assessment/Plan 1. Dysuria And frequency. Check UA C and S  2. PAH (pulmonary artery hypertension) ([place]) Noted  to be severe and likely secondary to COPD. Pulmonary artery pressure 72 06/15/17 with severe tricuspid regurg  3. Cor pulmonale, chronic ([facility]) Doing well with current regimen. Continue Torsemide 40 mg qd and aldactone 12.5 mg qd. Continue to monitor weight qd.   4. Permanent atrial fibrillation ([facility]) Rate controlled Continue Toprol 12.5 mg qd   5. Centrilobular emphysema ([facility]) No acute exacerbations Continue xopenex tid scheduled and as needed and atrovent tid   6. Chronic respiratory failure with  hypoxia ([facility]) Continue oxygen 2-3 liters continuously with a concentrator   7. Chronic constipation Give MOM 30 CC per protocol today Continue miralax 17 grams qd     Family/ staff Communication: resident  Labs/tests ordered:  INR due to next week. UA C and S

## 2019-03-30 NOTE — Patient Instructions (Signed)
[name] , Thank you for taking time to come for your Medicare Wellness Visit. I appreciate your ongoing commitment to your health goals. Please review the following plan we discussed and let me know if I can assist you in the future.   Screening recommendations/referrals: Colonoscopy aged out Mammogram aged out Bone Density deferred until pandemic improves Recommended yearly ophthalmology/optometry visit for glaucoma screening and checkup Recommended yearly dental visit for hygiene and checkup  Vaccinations: Influenza vaccine up to date Pneumococcal vaccine up to date Tdap vaccine  Up to date Shingles vaccine needed but deferred until after covid vaccine series completed     Advanced directives: reviewed  Conditions/risks identified: fall risk  Next appointment: 1 year   Preventive Care 15 Years and Older, Female Preventive care refers to lifestyle choices and visits with your health care provider that can promote health and wellness. What does preventive care include?  A yearly physical exam. This is also called an annual well check.  Dental exams once or twice a year.  Routine eye exams. Ask your health care provider how often you should have your eyes checked.  Personal lifestyle choices, including:  Daily care of your teeth and gums.  Regular physical activity.  Eating a healthy diet.  Avoiding tobacco and drug use.  Limiting alcohol use.  Practicing safe sex.  Taking low-dose aspirin every day.  Taking vitamin and mineral supplements as recommended by your health care provider. What happens during an annual well check? The services and screenings done by your health care provider during your annual well check will depend on your age, overall health, lifestyle risk factors, and family history of disease. Counseling  Your health care provider may ask you questions about your:  Alcohol use.  Tobacco use.  Drug use.  Emotional well-being.  Home and  relationship well-being.  Sexual activity.  Eating habits.  History of falls.  Memory and ability to understand (cognition).  Work and work Statistician.  Reproductive health. Screening  You may have the following tests or measurements:  Height, weight, and BMI.  Blood pressure.  Lipid and cholesterol levels. These may be checked every 5 years, or more frequently if you are over 26 years old.  Skin check.  Lung cancer screening. You may have this screening every year starting at age 84 if you have a 30-pack-year history of smoking and currently smoke or have quit within the past 15 years.  Fecal occult blood test (FOBT) of the stool. You may have this test every year starting at age 29.  Flexible sigmoidoscopy or colonoscopy. You may have a sigmoidoscopy every 5 years or a colonoscopy every 10 years starting at age 24.  Hepatitis C blood test.  Hepatitis B blood test.  Sexually transmitted disease (STD) testing.  Diabetes screening. This is done by checking your blood sugar (glucose) after you have not eaten for a while (fasting). You may have this done every 1-3 years.  Bone density scan. This is done to screen for osteoporosis. You may have this done starting at age 61.  Mammogram. This may be done every 1-2 years. Talk to your health care provider about how often you should have regular mammograms. Talk with your health care provider about your test results, treatment options, and if necessary, the need for more tests. Vaccines  Your health care provider may recommend certain vaccines, such as:  Influenza vaccine. This is recommended every year.  Tetanus, diphtheria, and acellular pertussis (Tdap, Td) vaccine. You may need  a Td booster every 10 years.  Zoster vaccine. You may need this after age 106.  Pneumococcal 13-valent conjugate (PCV13) vaccine. One dose is recommended after age 53.  Pneumococcal polysaccharide (PPSV23) vaccine. One dose is recommended after  age 27. Talk to your health care provider about which screenings and vaccines you need and how often you need them. This information is not intended to replace advice given to you by your health care provider. Make sure you discuss any questions you have with your health care provider. Document Released: 03/15/2015 Document Revised: 11/06/2015 Document Reviewed: 12/18/2014 Elsevier Interactive Patient Education  2017 [place] Prevention in the Home Falls can cause injuries. They can happen to people of all ages. There are many things you can do to make your home safe and to help prevent falls. What can I do on the outside of my home?  Regularly fix the edges of walkways and driveways and fix any cracks.  Remove anything that might make you trip as you walk through a door, such as a raised step or threshold.  Trim any bushes or trees on the path to your home.  Use bright outdoor lighting.  Clear any walking paths of anything that might make someone trip, such as rocks or tools.  Regularly check to see if handrails are loose or broken. Make sure that both sides of any steps have handrails.  Any raised decks and porches should have guardrails on the edges.  Have any leaves, snow, or ice cleared regularly.  Use sand or salt on walking paths during winter.  Clean up any spills in your garage right away. This includes oil or grease spills. What can I do in the bathroom?  Use night lights.  Install grab bars by the toilet and in the tub and shower. Do not use towel bars as grab bars.  Use non-skid mats or decals in the tub or shower.  If you need to sit down in the shower, use a plastic, non-slip stool.  Keep the floor dry. Clean up any water that spills on the floor as soon as it happens.  Remove soap buildup in the tub or shower regularly.  Attach bath mats securely with double-sided non-slip rug tape.  Do not have throw rugs and other things on the floor that can  make you trip. What can I do in the bedroom?  Use night lights.  Make sure that you have a light by your bed that is easy to reach.  Do not use any sheets or blankets that are too big for your bed. They should not hang down onto the floor.  Have a firm chair that has side arms. You can use this for support while you get dressed.  Do not have throw rugs and other things on the floor that can make you trip. What can I do in the kitchen?  Clean up any spills right away.  Avoid walking on wet floors.  Keep items that you use a lot in easy-to-reach places.  If you need to reach something above you, use a strong step stool that has a grab bar.  Keep electrical cords out of the way.  Do not use floor polish or wax that makes floors slippery. If you must use wax, use non-skid floor wax.  Do not have throw rugs and other things on the floor that can make you trip. What can I do with my stairs?  Do not leave any items on the  stairs.  Make sure that there are handrails on both sides of the stairs and use them. Fix handrails that are broken or loose. Make sure that handrails are as long as the stairways.  Check any carpeting to make sure that it is firmly attached to the stairs. Fix any carpet that is loose or worn.  Avoid having throw rugs at the top or bottom of the stairs. If you do have throw rugs, attach them to the floor with carpet tape.  Make sure that you have a light switch at the top of the stairs and the bottom of the stairs. If you do not have them, ask someone to add them for you. What else can I do to help prevent falls?  Wear shoes that:  Do not have high heels.  Have rubber bottoms.  Are comfortable and fit you well.  Are closed at the toe. Do not wear sandals.  If you use a stepladder:  Make sure that it is fully opened. Do not climb a closed stepladder.  Make sure that both sides of the stepladder are locked into place.  Ask someone to hold it for you,  if possible.  Clearly mark and make sure that you can see:  Any grab bars or handrails.  First and last steps.  Where the edge of each step is.  Use tools that help you move around (mobility aids) if they are needed. These include:  Canes.  Walkers.  Scooters.  Crutches.  Turn on the lights when you go into a dark area. Replace any light bulbs as soon as they burn out.  Set up your furniture so you have a clear path. Avoid moving your furniture around.  If any of your floors are uneven, fix them.  If there are any pets around you, be aware of where they are.  Review your medicines with your doctor. Some medicines can make you feel dizzy. This can increase your chance of falling. Ask your doctor what other things that you can do to help prevent falls. This information is not intended to replace advice given to you by your health care provider. Make sure you discuss any questions you have with your health care provider. Document Released: 12/13/2008 Document Revised: 07/25/2015 Document Reviewed: 03/23/2014 Elsevier Interactive Patient Education  2017 [employer].

## 2019-03-31 DIAGNOSIS — M62562 Muscle wasting and atrophy, not elsewhere classified, left lower leg: Secondary | ICD-10-CM | POA: Diagnosis not present

## 2019-03-31 DIAGNOSIS — R2689 Other abnormalities of gait and mobility: Secondary | ICD-10-CM | POA: Diagnosis not present

## 2019-03-31 DIAGNOSIS — M62561 Muscle wasting and atrophy, not elsewhere classified, right lower leg: Secondary | ICD-10-CM | POA: Diagnosis not present

## 2019-03-31 DIAGNOSIS — R278 Other lack of coordination: Secondary | ICD-10-CM | POA: Diagnosis not present

## 2019-03-31 DIAGNOSIS — M25511 Pain in right shoulder: Secondary | ICD-10-CM | POA: Diagnosis not present

## 2019-03-31 DIAGNOSIS — Z9181 History of falling: Secondary | ICD-10-CM | POA: Diagnosis not present

## 2019-03-31 DIAGNOSIS — N390 Urinary tract infection, site not specified: Secondary | ICD-10-CM | POA: Diagnosis not present

## 2019-03-31 DIAGNOSIS — I129 Hypertensive chronic kidney disease with stage 1 through stage 4 chronic kidney disease, or unspecified chronic kidney disease: Secondary | ICD-10-CM | POA: Diagnosis not present

## 2019-03-31 DIAGNOSIS — R319 Hematuria, unspecified: Secondary | ICD-10-CM | POA: Diagnosis not present

## 2019-04-03 ENCOUNTER — Encounter: Payer: Self-pay | Admitting: Adult Health

## 2019-04-03 ENCOUNTER — Non-Acute Institutional Stay (SKILLED_NURSING_FACILITY): Payer: Medicare Other | Admitting: Adult Health

## 2019-04-03 DIAGNOSIS — D6869 Other thrombophilia: Secondary | ICD-10-CM

## 2019-04-03 DIAGNOSIS — R2689 Other abnormalities of gait and mobility: Secondary | ICD-10-CM | POA: Diagnosis not present

## 2019-04-03 DIAGNOSIS — M25511 Pain in right shoulder: Secondary | ICD-10-CM | POA: Diagnosis not present

## 2019-04-03 DIAGNOSIS — Z9181 History of falling: Secondary | ICD-10-CM | POA: Diagnosis not present

## 2019-04-03 DIAGNOSIS — M62561 Muscle wasting and atrophy, not elsewhere classified, right lower leg: Secondary | ICD-10-CM | POA: Diagnosis not present

## 2019-04-03 DIAGNOSIS — M62562 Muscle wasting and atrophy, not elsewhere classified, left lower leg: Secondary | ICD-10-CM | POA: Diagnosis not present

## 2019-04-03 DIAGNOSIS — I4891 Unspecified atrial fibrillation: Secondary | ICD-10-CM

## 2019-04-03 DIAGNOSIS — Z7901 Long term (current) use of anticoagulants: Secondary | ICD-10-CM

## 2019-04-03 DIAGNOSIS — J441 Chronic obstructive pulmonary disease with (acute) exacerbation: Secondary | ICD-10-CM | POA: Diagnosis not present

## 2019-04-03 DIAGNOSIS — I5032 Chronic diastolic (congestive) heart failure: Secondary | ICD-10-CM | POA: Diagnosis not present

## 2019-04-03 DIAGNOSIS — Z20828 Contact with and (suspected) exposure to other viral communicable diseases: Secondary | ICD-10-CM | POA: Diagnosis not present

## 2019-04-03 DIAGNOSIS — Z9189 Other specified personal risk factors, not elsewhere classified: Secondary | ICD-10-CM | POA: Diagnosis not present

## 2019-04-03 DIAGNOSIS — R278 Other lack of coordination: Secondary | ICD-10-CM | POA: Diagnosis not present

## 2019-04-03 LAB — PROTIME-INR: Protime: 26.1 — AB (ref 10.0–13.8)

## 2019-04-03 LAB — POCT INR: INR: 2.2 — AB (ref 0.9–1.1)

## 2019-04-03 NOTE — Progress Notes (Signed)
Location:  [facility] of Service:  SNF (31) Provider:   Cindi Carbon, ANP [place] [phone]   [name], DO  Patient Care Team: [name], DO as PCP - General (Geriatric Medicine) Sanda Klein, MD as PCP - Cardiology (Cardiology)  Extended Emergency Contact Information Primary Emergency Contact: [name] of [place] Phone: [phone] Work Phone: [phone] Mobile Phone: [phone] Relation: Daughter Secondary Emergency Contact: Sabrinna, [place] Work Phone: [phone] Mobile Phone: [phone] Relation: Son  Code Status:  DNR Goals of care: Advanced Directive information Advanced Directives 03/30/2019  Does Patient Have a Medical Advance Directive? No  Type of Advance Directive -  Does patient want to make changes to medical advance directive? -  Copy of [place] in Chart? -  Would patient like information on creating a medical advance directive? No - Patient declined  Pre-existing out of facility DNR order (yellow form or pink MOST form) -     Chief Complaint  Patient presents with  . Acute Visit    anticoagulation    HPI:  Pt is a 86 y.o. female seen today for coumadin management. She is on therapy due to a hx of afib.She reports easy bruising. No excessive bleeding.  No complaints from the nursing staff.  CHA2DS2 VASc: 6   Lab Results  Component Value Date   INR 2.2 (A) 04/03/2019   INR 2.3 (A) 03/20/2019   INR 2.0 (A) 12/30/2018   PROTIME 26.1 (A) 04/03/2019   PROTIME 24.0 (A) 03/20/2019   PROTIME 23.2 (A) 12/30/2018      Past Medical History:  Diagnosis Date  . Arterial leg ulcer ([place]) 10/14/2018  . Breast cancer ([place])    left   . Centrilobular emphysema ([place]) 12/23/2017  . Chronic anticoagulation   . Chronic atrial fibrillation ([place])   . Chronic respiratory failure with hypoxia ([place]) 03/26/2018  . CKD (chronic kidney disease), stage  III   . Cor pulmonale, chronic ([place]) 06/13/2013   Pulmonary artery pressure 72 06/15/17 with severe tricuspid regurg  . Essential hypertension 06/13/2013  . Hypertension   . PAH (pulmonary artery hypertension) ([place]) 03/25/2017   Pulmonary artery pressure 72 06/15/17 with severe tricuspid regurg  . Pelvic fracture ([place]) 05/2016  . Permanent atrial fibrillation ([place]) 03/26/2018   Past Surgical History:  Procedure Laterality Date  . ABDOMINAL HYSTERECTOMY    . BREAST LUMPECTOMY      Allergies  Allergen Reactions  . Aspirin     Upset stomach  . Codeine Nausea Only    Nausea     Outpatient Encounter Medications as of 04/03/2019  Medication Sig  . acetaminophen (TYLENOL) 325 MG tablet Take 650 mg by mouth 2 (two) times daily.  Marland Kitchen allopurinol (ZYLOPRIM) 100 MG tablet Take 100 mg by mouth at bedtime.   . ALPRAZolam (XANAX) 0.5 MG tablet Take 1 tablet (0.5 mg total) by mouth at bedtime.  . Amino Acids-Protein Hydrolys (FEEDING SUPPLEMENT, PRO-STAT SUGAR FREE 64,) LIQD 1 fl oz by mouth daily  . antiseptic oral rinse (BIOTENE) LIQD 15 mLs by Mouth Rinse route as needed for dry mouth.  . benzonatate (TESSALON) 100 MG capsule Take 100 mg by mouth 3 (three) times daily as needed for cough.  . Calcium Carbonate-Vitamin D3 (CALCIUM 600-D) 600-400 MG-UNIT TABS Take 1 tablet by mouth daily.  Marland Kitchen dextromethorphan (DELSYM) 30 MG/5ML liquid Take 5 mLs by mouth every 4 (four) hours as needed for cough.   Marland Kitchen  diclofenac sodium (VOLTAREN) 1 % GEL Apply 2 g topically 4 (four) times daily. Right shoulder  . feeding supplement (BOOST HIGH PROTEIN) LIQD Take 1 Container by mouth daily.   Marland Kitchen HYDROcodone-acetaminophen (NORCO) 5-325 MG tablet Take 1 tablet by mouth every 6 (six) hours as needed for severe pain.  Marland Kitchen ipratropium (ATROVENT) 0.02 % nebulizer solution Take 2.5 mLs (0.5 mg total) by nebulization 3 (three) times daily.  Marland Kitchen levalbuterol (XOPENEX) 1.25 MG/0.5ML nebulizer solution Take 1.25 mg by nebulization 3  (three) times daily.  Marland Kitchen levalbuterol (XOPENEX) 1.25 MG/3ML nebulizer solution Take 1.25 mg by nebulization every 6 (six) hours as needed for wheezing or shortness of breath. In addition to three times daily schedule  . loratadine (CLARITIN) 10 MG tablet Take 10 mg by mouth at bedtime.  . metoprolol succinate (TOPROL-XL) 25 MG 24 hr tablet Take 12.5 mg by mouth daily.  . OXYGEN Level 3 or 3 mL/minute when out of room every shift 1, 2, and 3  . Polyethyl Glyc-Propyl Glyc PF (SYSTANE PRESERVATIVE FREE) 0.4-0.3 % SOLN Apply 1 drop to eye 3 (three) times daily as needed (dry eyes).  . polyethylene glycol (MIRALAX / GLYCOLAX) 17 g packet Take 17 g by mouth daily.   Marland Kitchen spironolactone (ALDACTONE) 25 MG tablet Take 12.5 mg by mouth daily.  Marland Kitchen torsemide (DEMADEX) 20 MG tablet Take 40 mg by mouth daily.   Marland Kitchen triamcinolone cream (KENALOG) 0.1 % Apply 1 application topically as needed. To right arm for itching  . warfarin (COUMADIN) 1 MG tablet Take 1 mg by mouth every other day. M, W, F  . warfarin (COUMADIN) 2 MG tablet Take 1 tablet (2 mg total) by mouth daily at 6 PM. (Patient taking differently: Take 2 mg by mouth every other day. Lydia Guiles, Thurs, Sat)  . [DISCONTINUED] mineral oil-hydrophilic petrolatum (AQUAPHOR) ointment Apply 1 application topically 2 (two) times daily. Apply to face/chest, and both upper arms   No facility-administered encounter medications on file as of 04/03/2019.    Review of Systems  Constitutional: Negative for activity change, appetite change, chills, diaphoresis, fatigue, fever and unexpected weight change.  Hematological: Bruises/bleeds easily.    Denies excessive bleeding or bruising .  Immunization History  Administered Date(s) Administered  . Influenza Split 12/07/2014  . Influenza, High Dose Seasonal PF 12/15/2018  . Influenza-Unspecified 11/05/2013, 12/02/2016, 12/16/2017  . Moderna SARS-COVID-2 Vaccination 03/07/2019  . Pneumococcal Polysaccharide-23 03/02/2012    . Tdap 03/02/2012  . Zoster 03/03/2011   Pertinent  Health Maintenance Due  Topic Date Due  . PNA vac Low Risk Adult (2 of 2 - PCV13) 03/02/2013  . INFLUENZA VACCINE  Completed  . DEXA SCAN  Completed   Fall Risk  03/30/2019 01/13/2019  Falls in the past year? 1 -  Number falls in past yr: 1 -  Injury with Fall? 1 -  Risk for fall due to : History of fall(s) History of fall(s);Impaired balance/gait;Impaired mobility;Medication side effect;Mental status change  Follow up Falls evaluation completed Falls evaluation completed;Education provided;Falls prevention discussed  Comment - done at [facility] as part of care plan   Functional Status Survey:    There were no vitals filed for this visit. There is no height or weight on file to calculate BMI. Physical Exam Constitutional:      Appearance: Normal appearance.  Neurological:     Mental Status: She is alert.     Labs reviewed: Recent Labs    10/31/18 0000 11/01/18 0600 11/14/18 0000  NA  136* 136* 143  K 4.5 4.5 4.0  BUN 61* 61* 49*  CREATININE 1.2* 1.2* 1.1   No results for input(s): AST, ALT, ALKPHOS, BILITOT, PROT, ALBUMIN in the last 8760 hours. Recent Labs    11/01/18 0000 11/01/18 0600  WBC 5.9 5.9  HGB 12.8 12.8  HCT 38 38  PLT 167 167   Lab Results  Component Value Date   TSH 1.602 11/18/2017   No results found for: HGBA1C No results found for: CHOL, HDL, LDLCALC, LDLDIRECT, TRIG, CHOLHDL  Significant Diagnostic Results in last 30 days:  No results found.  Assessment/Plan 1. Hypercoagulable state due to atrial fibrillation (HCC) Rate is controlled.  Continue coumadin Warfarin 60m M, W, and F and 2 mg Sun, Tues, Thurs, and Sat Recheck INR in 2 weeks   2. Current use of long term anticoagulation As above    Family/ staff Communication: resident and staff  Labs/tests ordered:  INR 2 weeks

## 2019-04-04 DIAGNOSIS — R278 Other lack of coordination: Secondary | ICD-10-CM | POA: Diagnosis not present

## 2019-04-04 DIAGNOSIS — Z9181 History of falling: Secondary | ICD-10-CM | POA: Diagnosis not present

## 2019-04-04 DIAGNOSIS — M62562 Muscle wasting and atrophy, not elsewhere classified, left lower leg: Secondary | ICD-10-CM | POA: Diagnosis not present

## 2019-04-04 DIAGNOSIS — R2689 Other abnormalities of gait and mobility: Secondary | ICD-10-CM | POA: Diagnosis not present

## 2019-04-04 DIAGNOSIS — M25511 Pain in right shoulder: Secondary | ICD-10-CM | POA: Diagnosis not present

## 2019-04-04 DIAGNOSIS — Z23 Encounter for immunization: Secondary | ICD-10-CM | POA: Diagnosis not present

## 2019-04-04 DIAGNOSIS — M62561 Muscle wasting and atrophy, not elsewhere classified, right lower leg: Secondary | ICD-10-CM | POA: Diagnosis not present

## 2019-04-07 DIAGNOSIS — R2689 Other abnormalities of gait and mobility: Secondary | ICD-10-CM | POA: Diagnosis not present

## 2019-04-07 DIAGNOSIS — M62562 Muscle wasting and atrophy, not elsewhere classified, left lower leg: Secondary | ICD-10-CM | POA: Diagnosis not present

## 2019-04-07 DIAGNOSIS — Z9181 History of falling: Secondary | ICD-10-CM | POA: Diagnosis not present

## 2019-04-07 DIAGNOSIS — M62561 Muscle wasting and atrophy, not elsewhere classified, right lower leg: Secondary | ICD-10-CM | POA: Diagnosis not present

## 2019-04-07 DIAGNOSIS — R278 Other lack of coordination: Secondary | ICD-10-CM | POA: Diagnosis not present

## 2019-04-07 DIAGNOSIS — M25511 Pain in right shoulder: Secondary | ICD-10-CM | POA: Diagnosis not present

## 2019-04-10 DIAGNOSIS — M62562 Muscle wasting and atrophy, not elsewhere classified, left lower leg: Secondary | ICD-10-CM | POA: Diagnosis not present

## 2019-04-10 DIAGNOSIS — Z9181 History of falling: Secondary | ICD-10-CM | POA: Diagnosis not present

## 2019-04-10 DIAGNOSIS — M25511 Pain in right shoulder: Secondary | ICD-10-CM | POA: Diagnosis not present

## 2019-04-10 DIAGNOSIS — R278 Other lack of coordination: Secondary | ICD-10-CM | POA: Diagnosis not present

## 2019-04-10 DIAGNOSIS — M62561 Muscle wasting and atrophy, not elsewhere classified, right lower leg: Secondary | ICD-10-CM | POA: Diagnosis not present

## 2019-04-10 DIAGNOSIS — R2689 Other abnormalities of gait and mobility: Secondary | ICD-10-CM | POA: Diagnosis not present

## 2019-04-13 ENCOUNTER — Other Ambulatory Visit: Payer: Self-pay | Admitting: Adult Health

## 2019-04-13 DIAGNOSIS — R278 Other lack of coordination: Secondary | ICD-10-CM | POA: Diagnosis not present

## 2019-04-13 DIAGNOSIS — R2689 Other abnormalities of gait and mobility: Secondary | ICD-10-CM | POA: Diagnosis not present

## 2019-04-13 DIAGNOSIS — M25511 Pain in right shoulder: Secondary | ICD-10-CM | POA: Diagnosis not present

## 2019-04-13 DIAGNOSIS — M62562 Muscle wasting and atrophy, not elsewhere classified, left lower leg: Secondary | ICD-10-CM | POA: Diagnosis not present

## 2019-04-13 DIAGNOSIS — Z9181 History of falling: Secondary | ICD-10-CM | POA: Diagnosis not present

## 2019-04-13 DIAGNOSIS — L97909 Non-pressure chronic ulcer of unspecified part of unspecified lower leg with unspecified severity: Secondary | ICD-10-CM

## 2019-04-13 DIAGNOSIS — M62561 Muscle wasting and atrophy, not elsewhere classified, right lower leg: Secondary | ICD-10-CM | POA: Diagnosis not present

## 2019-04-13 MED ORDER — HYDROCODONE-ACETAMINOPHEN 5-325 MG PO TABS: 1.0000 | ORAL_TABLET | Freq: Four times a day (QID) | ORAL | 0 refills | Status: DC | PRN

## 2019-04-17 DIAGNOSIS — M62561 Muscle wasting and atrophy, not elsewhere classified, right lower leg: Secondary | ICD-10-CM | POA: Diagnosis not present

## 2019-04-17 DIAGNOSIS — R278 Other lack of coordination: Secondary | ICD-10-CM | POA: Diagnosis not present

## 2019-04-17 DIAGNOSIS — M62562 Muscle wasting and atrophy, not elsewhere classified, left lower leg: Secondary | ICD-10-CM | POA: Diagnosis not present

## 2019-04-17 DIAGNOSIS — M25511 Pain in right shoulder: Secondary | ICD-10-CM | POA: Diagnosis not present

## 2019-04-17 DIAGNOSIS — R2689 Other abnormalities of gait and mobility: Secondary | ICD-10-CM | POA: Diagnosis not present

## 2019-04-17 DIAGNOSIS — Z9181 History of falling: Secondary | ICD-10-CM | POA: Diagnosis not present

## 2019-04-20 DIAGNOSIS — M62561 Muscle wasting and atrophy, not elsewhere classified, right lower leg: Secondary | ICD-10-CM | POA: Diagnosis not present

## 2019-04-20 DIAGNOSIS — M62562 Muscle wasting and atrophy, not elsewhere classified, left lower leg: Secondary | ICD-10-CM | POA: Diagnosis not present

## 2019-04-20 DIAGNOSIS — R2689 Other abnormalities of gait and mobility: Secondary | ICD-10-CM | POA: Diagnosis not present

## 2019-04-20 DIAGNOSIS — Z9181 History of falling: Secondary | ICD-10-CM | POA: Diagnosis not present

## 2019-04-20 DIAGNOSIS — M25511 Pain in right shoulder: Secondary | ICD-10-CM | POA: Diagnosis not present

## 2019-04-20 DIAGNOSIS — R278 Other lack of coordination: Secondary | ICD-10-CM | POA: Diagnosis not present

## 2019-04-25 ENCOUNTER — Non-Acute Institutional Stay (SKILLED_NURSING_FACILITY): Payer: Medicare Other | Admitting: Internal Medicine

## 2019-04-25 ENCOUNTER — Encounter: Payer: Self-pay | Admitting: Internal Medicine

## 2019-04-25 DIAGNOSIS — I4821 Permanent atrial fibrillation: Secondary | ICD-10-CM | POA: Diagnosis not present

## 2019-04-25 DIAGNOSIS — J9611 Chronic respiratory failure with hypoxia: Secondary | ICD-10-CM | POA: Diagnosis not present

## 2019-04-25 DIAGNOSIS — J432 Centrilobular emphysema: Secondary | ICD-10-CM

## 2019-04-25 DIAGNOSIS — K5909 Other constipation: Secondary | ICD-10-CM

## 2019-04-25 DIAGNOSIS — D6869 Other thrombophilia: Secondary | ICD-10-CM

## 2019-04-25 DIAGNOSIS — L97909 Non-pressure chronic ulcer of unspecified part of unspecified lower leg with unspecified severity: Secondary | ICD-10-CM | POA: Diagnosis not present

## 2019-04-25 DIAGNOSIS — L989 Disorder of the skin and subcutaneous tissue, unspecified: Secondary | ICD-10-CM | POA: Diagnosis not present

## 2019-04-25 DIAGNOSIS — I4891 Unspecified atrial fibrillation: Secondary | ICD-10-CM | POA: Diagnosis not present

## 2019-04-25 NOTE — Progress Notes (Signed)
Patient ID: [name], female   DOB: 09-06-1924, 86 y.o.   MRN: [id number]  Location:   [facility] Room Number: 638 GTXMI of Service:  SNF (31) Provider:   [name], DO  Patient Care Team: [name], DO as PCP - General (Geriatric Medicine) Sanda Klein, MD as PCP - Cardiology (Cardiology)  Extended Emergency Contact Information Primary Emergency Contact: [name] of [place] Phone: [phone] Work Phone: [phone] Mobile Phone: [phone] Relation: Daughter Secondary Emergency Contact: Lesleigh Noe Work Phone: [phone] Mobile Phone: [phone] Relation: Son  Code Status:  DNR Goals of care: Advanced Directive information Advanced Directives 04/25/2019  Does Patient Have a Medical Advance Directive? Yes  Type of Advance Directive Out of facility DNR (pink MOST or yellow form)  Does patient want to make changes to medical advance directive? No - Patient declined  Copy of [place] in Chart? -  Would patient like information on creating a medical advance directive? -  Pre-existing out of facility DNR order (yellow form or pink MOST form) -     Chief Complaint  Patient presents with  . Medical Management of Chronic Issues    Routine Visit     HPI:  Pt is a 86 y.o. female seen today for medical management of chronic diseases.    [name] is doing well.  She still has several painful skin areas where she's had biopsies that do not heal.  She had a pain pill late this morning.    Her right arm still aches from her shoulder.  voltaren helps.  She is walking to the restroom now with help and feels better about that since having therapy a few months ago.    Past Medical History:  Diagnosis Date  . Arterial leg ulcer ([place]) 10/14/2018  . Breast cancer ([place])    left   . Centrilobular emphysema ([place]) 12/23/2017  . Chronic anticoagulation   . Chronic atrial fibrillation ([place])   . Chronic  respiratory failure with hypoxia ([place]) 03/26/2018  . CKD (chronic kidney disease), stage III   . Cor pulmonale, chronic ([place]) 06/13/2013   Pulmonary artery pressure 72 06/15/17 with severe tricuspid regurg  . Essential hypertension 06/13/2013  . Hypertension   . PAH (pulmonary artery hypertension) ([place]) 03/25/2017   Pulmonary artery pressure 72 06/15/17 with severe tricuspid regurg  . Pelvic fracture ([place]) 05/2016  . Permanent atrial fibrillation ([place]) 03/26/2018   Past Surgical History:  Procedure Laterality Date  . ABDOMINAL HYSTERECTOMY    . BREAST LUMPECTOMY      Allergies  Allergen Reactions  . Aspirin     Upset stomach  . Codeine Nausea Only    Nausea     Outpatient Encounter Medications as of 04/25/2019  Medication Sig  . acetaminophen (TYLENOL) 325 MG tablet Take 650 mg by mouth 2 (two) times daily.  Marland Kitchen allopurinol (ZYLOPRIM) 100 MG tablet Take 100 mg by mouth at bedtime.   . ALPRAZolam (XANAX) 0.5 MG tablet Take 1 tablet (0.5 mg total) by mouth at bedtime.  . Amino Acids-Protein Hydrolys (FEEDING SUPPLEMENT, PRO-STAT SUGAR FREE 64,) LIQD 1 fl oz by mouth daily  . antiseptic oral rinse (BIOTENE) LIQD 15 mLs by Mouth Rinse route as needed for dry mouth.  . benzonatate (TESSALON) 100 MG capsule Take 100 mg by mouth 3 (three) times daily as needed for cough.  . Calcium Carbonate-Vitamin D3 (CALCIUM 600-D) 600-400 MG-UNIT TABS Take 1 tablet by mouth daily.  Marland Kitchen dextromethorphan (  DELSYM) 30 MG/5ML liquid Take 5 mLs by mouth every 4 (four) hours as needed for cough.   . diclofenac sodium (VOLTAREN) 1 % GEL Apply 2 g topically 4 (four) times daily. Right shoulder  . feeding supplement (BOOST HIGH PROTEIN) LIQD Take 1 Container by mouth daily.   Marland Kitchen HYDROcodone-acetaminophen (NORCO) 5-325 MG tablet Take 1 tablet by mouth every 6 (six) hours as needed for severe pain.  Marland Kitchen ipratropium (ATROVENT) 0.02 % nebulizer solution Take 2.5 mLs (0.5 mg total) by nebulization 3 (three) times daily.  Marland Kitchen  levalbuterol (XOPENEX) 1.25 MG/0.5ML nebulizer solution Take 1.25 mg by nebulization 3 (three) times daily.  Marland Kitchen levalbuterol (XOPENEX) 1.25 MG/3ML nebulizer solution Take 1.25 mg by nebulization every 6 (six) hours as needed for wheezing or shortness of breath. In addition to three times daily schedule  . loratadine (CLARITIN) 10 MG tablet Take 10 mg by mouth at bedtime.  . metoprolol succinate (TOPROL-XL) 25 MG 24 hr tablet Take 12.5 mg by mouth daily.  . OXYGEN Level 3 or 3 mL/minute when out of room every shift 1, 2, and 3  . Polyethyl Glyc-Propyl Glyc PF (SYSTANE PRESERVATIVE FREE) 0.4-0.3 % SOLN Apply 1 drop to eye 3 (three) times daily as needed (dry eyes).  . polyethylene glycol (MIRALAX / GLYCOLAX) 17 g packet Take 17 g by mouth daily.   Marland Kitchen spironolactone (ALDACTONE) 25 MG tablet Take 12.5 mg by mouth daily.  Marland Kitchen torsemide (DEMADEX) 20 MG tablet Take 40 mg by mouth daily.   Marland Kitchen triamcinolone cream (KENALOG) 0.1 % Apply 1 application topically as needed. To right arm for itching  . warfarin (COUMADIN) 1 MG tablet Take 1 mg by mouth every other day. M, W, F  . warfarin (COUMADIN) 2 MG tablet Take 1 tablet (2 mg total) by mouth daily at 6 PM.   No facility-administered encounter medications on file as of 04/25/2019.    Review of Systems  Constitutional: Negative for activity change, appetite change, chills and fever.  HENT: Positive for hearing loss. Negative for congestion, sore throat and trouble swallowing.   Eyes: Negative for visual disturbance.  Respiratory: Positive for shortness of breath and wheezing. Negative for cough and chest tightness.        Able to now ambulate to restroom with assistance  Cardiovascular: Positive for leg swelling. Negative for chest pain and palpitations.       Elevates feet at rest  Gastrointestinal: Negative for abdominal pain, constipation, diarrhea, nausea and vomiting.  Genitourinary: Negative for dysuria.       Prone to incontinence which is partially  urgency and partially functional--doing better here after PT for gait  Musculoskeletal: Positive for arthralgias and gait problem.  Skin: Negative for color change.       Multiple skin cancer sites on chest, right arm that won't heal and are painful    Immunization History  Administered Date(s) Administered  . Influenza Split 12/07/2014  . Influenza, High Dose Seasonal PF 12/15/2018  . Influenza-Unspecified 11/05/2013, 12/02/2016, 12/16/2017  . Moderna SARS-COVID-2 Vaccination 03/07/2019, 04/04/2019  . Pneumococcal Polysaccharide-23 03/02/2012  . Tdap 03/02/2012  . Zoster 03/03/2011   Pertinent  Health Maintenance Due  Topic Date Due  . PNA vac Low Risk Adult (2 of 2 - PCV13) 03/02/2013  . INFLUENZA VACCINE  Completed  . DEXA SCAN  Completed   Fall Risk  03/30/2019 01/13/2019  Falls in the past year? 1 -  Number falls in past yr: 1 -  Injury with Fall? 1 -  Risk for fall due to : History of fall(s) History of fall(s);Impaired balance/gait;Impaired mobility;Medication side effect;Mental status change  Follow up Falls evaluation completed Falls evaluation completed;Education provided;Falls prevention discussed  Comment - done at [facility] as part of care plan   Functional Status Survey:    Vitals:   04/25/19 1018  BP: 108/73  Pulse: 73  Temp: (!) 97.3 F (36.3 C)  SpO2: 93%  Weight: 144 lb 3.2 oz (65.4 kg)  Height: _0  (1.626 m)   Body mass index is 24.75 kg/m. Physical Exam Vitals and nursing note reviewed.  Constitutional:      General: She is not in acute distress.    Appearance: Normal appearance. She is not toxic-appearing.  HENT:     Head: Normocephalic and atraumatic.     Nose: No congestion.  Eyes:     Extraocular Movements: Extraocular movements intact.     Conjunctiva/sclera: Conjunctivae normal.     Pupils: Pupils are equal, round, and reactive to light.  Cardiovascular:     Rate and Rhythm: Rhythm irregular.     Heart sounds: No murmur.    Pulmonary:     Effort: Pulmonary effort is normal.     Breath sounds: No wheezing.     Comments: Was using neb when I entered Abdominal:     General: Bowel sounds are normal.     Palpations: Abdomen is soft.     Tenderness: There is no abdominal tenderness.  Musculoskeletal:        General: Normal range of motion.     Right lower leg: Edema present.     Left lower leg: Edema present.     Comments: Mild nonpitting edema bilaterally with brownish purple discoloration of lower legs  Skin:    Comments: Dressing on right forearm, two areas of chest where debridement of skin cancer areas had been performed--remain exquisitely tender  Neurological:     General: No focal deficit present.     Mental Status: She is alert and oriented to person, place, and time.     Cranial Nerves: No cranial nerve deficit.     Gait: Gait abnormal.     Comments: Able to ambulate with walker with assist  Psychiatric:        Mood and Affect: Mood normal.        Behavior: Behavior normal.        Thought Content: Thought content normal.     Labs reviewed: Recent Labs    10/31/18 0000 11/01/18 0600 11/14/18 0000  NA 136* 136* 143  K 4.5 4.5 4.0  BUN 61* 61* 49*  CREATININE 1.2* 1.2* 1.1   No results for input(s): AST, ALT, ALKPHOS, BILITOT, PROT, ALBUMIN in the last 8760 hours. Recent Labs    11/01/18 0000 11/01/18 0600  WBC 5.9 5.9  HGB 12.8 12.8  HCT 38 38  PLT 167 167   Lab Results  Component Value Date   TSH 1.602 11/18/2017   No results found for: HGBA1C No results found for: CHOL, HDL, LDLCALC, LDLDIRECT, TRIG, CHOLHDL  Assessment/Plan 1. Arterial leg ulcer ([place]) -now has just two small skin tears on legs--left with scab present--getting regular dressing changes to these and her right arm and two chest wounds from skin cancer mgt  2. Chronic respiratory failure with hypoxia (HCC) -remains stable recently, continue chronic oxygen therapy  3. Centrilobular emphysema (HCC) -stable  recently,continue oxygen, prn delsym, tessalon prn, scheduled and prn xopenex  4. Chronic constipation -doing ok on daily  miralax, hydration  5. Hypercoagulable state due to atrial fibrillation (HCC) -cont warfarin for INR goal 2-3 -she has recently been therapeutic  Lab Results  Component Value Date   INR 2.2 (A) 04/03/2019   INR 2.3 (A) 03/20/2019   INR 2.0 (A) 12/30/2018   PROTIME 26.1 (A) 04/03/2019   PROTIME 24.0 (A) 03/20/2019   PROTIME 23.2 (A) 12/30/2018    6. Permanent atrial fibrillation (HCC) -continues on warfarin anticoagulation and not requiring rate control agents  7. Skin lesions -again discussed with resident idea of avoiding further treatments of these due to severe pain she gets each time they are debrided or newly treated and she agrees with perspective -try to avoid further derm visits because skin cancers of her types rarely metastasize and lead to death  Family/ staff Communication: discussed with snf nurse  Labs/tests ordered:  No new  Valentina Alcoser L. Dyron Kawano, D.O. [facility] [address], [place] [address] Cell Phone (Mon-Fri 8am-5pm):  [phone] On Call:  [phone] & follow prompts after 5pm & weekends Office Phone:  [phone] Office Fax:  [phone]

## 2019-05-03 ENCOUNTER — Other Ambulatory Visit: Payer: Self-pay | Admitting: Internal Medicine

## 2019-05-03 DIAGNOSIS — H1031 Unspecified acute conjunctivitis, right eye: Secondary | ICD-10-CM

## 2019-05-03 MED ORDER — GENTAMICIN SULFATE 0.3 % OP OINT: TOPICAL_OINTMENT | Freq: Three times a day (TID) | OPHTHALMIC | 0 refills | Status: DC

## 2019-05-03 NOTE — Progress Notes (Signed)
Larene Beach thinks it's a conjunctivitis not dry eye issue so stop systane I started yesterday and use gent ointment tid for 7 days.

## 2019-05-04 ENCOUNTER — Encounter: Payer: Self-pay | Admitting: Adult Health

## 2019-05-04 ENCOUNTER — Non-Acute Institutional Stay (SKILLED_NURSING_FACILITY): Payer: Medicare Other | Admitting: Adult Health

## 2019-05-04 DIAGNOSIS — H0100A Unspecified blepharitis right eye, upper and lower eyelids: Secondary | ICD-10-CM

## 2019-05-04 DIAGNOSIS — H0100B Unspecified blepharitis left eye, upper and lower eyelids: Secondary | ICD-10-CM | POA: Diagnosis not present

## 2019-05-04 DIAGNOSIS — H1031 Unspecified acute conjunctivitis, right eye: Secondary | ICD-10-CM

## 2019-05-04 NOTE — Progress Notes (Signed)
.  Location:  [facility] of Service:  SNF (31) Provider:  Cindi Carbon, ANP [place] [phone]  [name], DO  Patient Care Team: [name], DO as PCP - General (Geriatric Medicine) Sanda Klein, MD as PCP - Cardiology (Cardiology)  Extended Emergency Contact Information Primary Emergency Contact: [name] of [place] Phone: [phone] Work Phone: [phone] Mobile Phone: [phone] Relation: Daughter Secondary Emergency Contact: Teria, Khachatryan Work Phone: [phone] Mobile Phone: [phone] Relation: Son  Code Status:  DNR Goals of care: Advanced Directive information Advanced Directives 04/25/2019  Does Patient Have a Medical Advance Directive? Yes  Type of Advance Directive Out of facility DNR (pink MOST or yellow form)  Does patient want to make changes to medical advance directive? No - Patient declined  Copy of [place] in Chart? -  Would patient like information on creating a medical advance directive? -  Pre-existing out of facility DNR order (yellow form or pink MOST form) -     Chief Complaint  Patient presents with  . Acute Visit    right eye redness and drainage to both eyes    HPI:  Pt is a 86 y.o. female seen today for an acute visit for right eye redness and drainage to both eyes.   She has had redness and irritation to the right eye for two days. She reports that she scratched her eye with a tissue and then it became irritated. She also reports chronic purulent drainage to both eyes and eye lashes that has been present for several months. No fever, change in fever or eye pain.   Gentamycin was ordered but pharmacy is reporting an allergy to neomycin. The resident can not remember the reaction to neomycin.    Past Medical History:  Diagnosis Date  . 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 Breast cancer ([place])    left   . Centrilobular  emphysema ([place]) 12/23/2017  . Chronic anticoagulation   . Chronic atrial fibrillation ([place])   . Chronic respiratory failure with hypoxia ([place]) 03/26/2018  . CKD (chronic kidney disease), stage III   . Cor pulmonale, chronic ([place]) 06/13/2013   Pulmonary artery pressure 72 06/15/17 with severe tricuspid regurg  . Essential hypertension 06/13/2013  . Hypertension   . PAH (pulmonary artery hypertension) ([place]) 03/25/2017   Pulmonary artery pressure 72 06/15/17 with severe tricuspid regurg  . Pelvic fracture ([place]) 05/2016  . Permanent atrial fibrillation ([place]) 03/26/2018   Past Surgical History:  Procedure Laterality Date  . ABDOMINAL HYSTERECTOMY    . BREAST LUMPECTOMY      Allergies  Allergen Reactions  . Aspirin     Upset stomach  . Neosporin [Bacitracin-Polymyxin B]     Unsure of reaction  . Codeine Nausea Only    Nausea     Outpatient Encounter Medications as of 05/04/2019  Medication Sig  . ciprofloxacin (CILOXAN) 0.3 % ophthalmic solution Place 2 drops into both eyes every 4 (four) hours while awake. X 5 days  . Eyelid Cleansers (OCUSOFT EYELID CLEANSING) PADS Apply 1 application topically daily.  Marland Kitchen acetaminophen (TYLENOL) 325 MG tablet Take 650 mg by mouth 2 (two) times daily.  Marland Kitchen allopurinol (ZYLOPRIM) 100 MG tablet Take 100 mg by mouth at bedtime.   . ALPRAZolam (XANAX) 0.5 MG tablet Take 1 tablet (0.5 mg total) by mouth at bedtime.  . Amino Acids-Protein Hydrolys (FEEDING SUPPLEMENT, PRO-STAT SUGAR FREE 64,) LIQD 1 fl oz  by mouth daily  . antiseptic oral rinse (BIOTENE) LIQD 15 mLs by Mouth Rinse route as needed for dry mouth.  . benzonatate (TESSALON) 100 MG capsule Take 100 mg by mouth 3 (three) times daily as needed for cough.  . Calcium Carbonate-Vitamin D3 (CALCIUM 600-D) 600-400 MG-UNIT TABS Take 1 tablet by mouth daily.  Marland Kitchen dextromethorphan (DELSYM) 30 MG/5ML liquid Take 5 mLs by mouth every 4 (four) hours as needed for cough.   . diclofenac sodium (VOLTAREN) 1 % GEL Apply  2 g topically 4 (four) times daily. Right shoulder  . feeding supplement (BOOST HIGH PROTEIN) LIQD Take 1 Container by mouth daily.   Marland Kitchen HYDROcodone-acetaminophen (NORCO) 5-325 MG tablet Take 1 tablet by mouth every 6 (six) hours as needed for severe pain.  Marland Kitchen ipratropium (ATROVENT) 0.02 % nebulizer solution Take 2.5 mLs (0.5 mg total) by nebulization 3 (three) times daily.  Marland Kitchen levalbuterol (XOPENEX) 1.25 MG/0.5ML nebulizer solution Take 1.25 mg by nebulization 3 (three) times daily.  Marland Kitchen levalbuterol (XOPENEX) 1.25 MG/3ML nebulizer solution Take 1.25 mg by nebulization every 6 (six) hours as needed for wheezing or shortness of breath. In addition to three times daily schedule  . loratadine (CLARITIN) 10 MG tablet Take 10 mg by mouth at bedtime.  . metoprolol succinate (TOPROL-XL) 25 MG 24 hr tablet Take 12.5 mg by mouth daily.  . OXYGEN Level 3 or 3 mL/minute when out of room every shift 1, 2, and 3  . Polyethyl Glyc-Propyl Glyc PF (SYSTANE PRESERVATIVE FREE) 0.4-0.3 % SOLN Apply 1 drop to eye 3 (three) times daily as needed (dry eyes). (Patient taking differently: Apply 2 drops to eye in the morning and at bedtime. And tid prn)  . polyethylene glycol (MIRALAX / GLYCOLAX) 17 g packet Take 17 g by mouth daily.   Marland Kitchen spironolactone (ALDACTONE) 25 MG tablet Take 12.5 mg by mouth daily.  Marland Kitchen torsemide (DEMADEX) 20 MG tablet Take 40 mg by mouth daily.   Marland Kitchen triamcinolone cream (KENALOG) 0.1 % Apply 1 application topically as needed. To right arm for itching  . warfarin (COUMADIN) 1 MG tablet Take 1 mg by mouth every other day. M, W, F  . warfarin (COUMADIN) 2 MG tablet Take 1 tablet (2 mg total) by mouth daily at 6 PM.  . [DISCONTINUED] gentamicin (GARAMYCIN) 0.3 % ophthalmic ointment Place into the right eye 3 (three) times daily for 7 days.   No facility-administered encounter medications on file as of 05/04/2019.    Review of Systems  Constitutional: Negative for activity change, appetite change, chills,  diaphoresis, fatigue, fever and unexpected weight change.  Eyes: Positive for discharge and redness. Negative for photophobia, pain, itching and visual disturbance.       Matted eye lashes    Immunization History  Administered Date(s) Administered  . Influenza Split 12/07/2014  . Influenza, High Dose Seasonal PF 12/15/2018  . Influenza-Unspecified 11/05/2013, 12/02/2016, 12/16/2017  . Moderna SARS-COVID-2 Vaccination 03/07/2019, 04/04/2019  . Pneumococcal Polysaccharide-23 03/02/2012  . Tdap 03/02/2012  . Zoster 03/03/2011   Pertinent  Health Maintenance Due  Topic Date Due  . PNA vac Low Risk Adult (2 of 2 - PCV13) 03/02/2013  . INFLUENZA VACCINE  Completed  . DEXA SCAN  Completed   Fall Risk  03/30/2019 01/13/2019  Falls in the past year? 1 -  Number falls in past yr: 1 -  Injury with Fall? 1 -  Risk for fall due to : History of fall(s) History of fall(s);Impaired balance/gait;Impaired mobility;Medication side effect;Mental  status change  Follow up Falls evaluation completed Falls evaluation completed;Education provided;Falls prevention discussed  Comment - done at [facility] as part of care plan   Functional Status Survey:    There were no vitals filed for this visit. There is no height or weight on file to calculate BMI. Physical Exam Vitals and nursing note reviewed.  Constitutional:      Appearance: Normal appearance.  HENT:     Head: Normocephalic and atraumatic.  Eyes:     General: No scleral icterus.       Right eye: Discharge present.        Left eye: No discharge.     Extraocular Movements: Extraocular movements intact.     Pupils: Pupils are equal, round, and reactive to light.     Comments: Right eye erythema to sclera and conjunctivae. No ciliary flush. Mild edema noted to the right upper lid.   Neurological:     Mental Status: She is alert.     Labs reviewed: Recent Labs    10/31/18 0000 11/01/18 0600 11/14/18 0000  NA 136* 136* 143  K 4.5 4.5  4.0  BUN 61* 61* 49*  CREATININE 1.2* 1.2* 1.1   No results for input(s): AST, ALT, ALKPHOS, BILITOT, PROT, ALBUMIN in the last 8760 hours. Recent Labs    11/01/18 0000 11/01/18 0600  WBC 5.9 5.9  HGB 12.8 12.8  HCT 38 38  PLT 167 167   Lab Results  Component Value Date   TSH 1.602 11/18/2017   No results found for: HGBA1C No results found for: CHOL, HDL, LDLCALC, LDLDIRECT, TRIG, CHOLHDL  Significant Diagnostic Results in last 30 days:  No results found.  Assessment/Plan 1. Acute bacterial conjunctivitis of right eye Cipro gtts 0.3% 2 gtts q 4 prn x 5 days D/C gent gtts due to possible allergy   2. Blepharitis of upper and lower eyelids of both eyes, unspecified type Apply Ocusoft cleansing pad and cleans eyes daily Systane 2 gtts bid and tid prn irritation     Family/ staff Communication: nurse and resident   Labs/tests ordered:  NA

## 2019-05-05 ENCOUNTER — Other Ambulatory Visit: Payer: Self-pay

## 2019-05-05 DIAGNOSIS — F419 Anxiety disorder, unspecified: Secondary | ICD-10-CM

## 2019-05-05 MED ORDER — ALPRAZOLAM 0.5 MG PO TABS: 0.5000 mg | ORAL_TABLET | Freq: Every day | ORAL | 5 refills | Status: DC

## 2019-05-10 DIAGNOSIS — Z20828 Contact with and (suspected) exposure to other viral communicable diseases: Secondary | ICD-10-CM | POA: Diagnosis not present

## 2019-05-10 DIAGNOSIS — Z9189 Other specified personal risk factors, not elsewhere classified: Secondary | ICD-10-CM | POA: Diagnosis not present

## 2019-05-12 ENCOUNTER — Non-Acute Institutional Stay (SKILLED_NURSING_FACILITY): Payer: Medicare Other | Admitting: Adult Health

## 2019-05-12 ENCOUNTER — Encounter: Payer: Self-pay | Admitting: Adult Health

## 2019-05-12 DIAGNOSIS — L089 Local infection of the skin and subcutaneous tissue, unspecified: Secondary | ICD-10-CM

## 2019-05-12 DIAGNOSIS — D485 Neoplasm of uncertain behavior of skin: Secondary | ICD-10-CM | POA: Diagnosis not present

## 2019-05-12 DIAGNOSIS — T148XXA Other injury of unspecified body region, initial encounter: Secondary | ICD-10-CM

## 2019-05-12 NOTE — Progress Notes (Signed)
Location:  [facility] of Service:  SNF (31) Provider:   Cindi Carbon, ANP [place] [phone]  [name], DO  Patient Care Team: [name], DO as PCP - General (Geriatric Medicine) Sanda Klein, MD as PCP - Cardiology (Cardiology)  Extended Emergency Contact Information Primary Emergency Contact: [name] of [place] Phone: [phone] Work Phone: [phone] Mobile Phone: [phone] Relation: Daughter Secondary Emergency Contact: Aalia, [place] Work Phone: [phone] Mobile Phone: [phone] Relation: Son  Code Status:  DNR Goals of care: Advanced Directive information Advanced Directives 04/25/2019  Does Patient Have a Medical Advance Directive? Yes  Type of Advance Directive Out of facility DNR (pink MOST or yellow form)  Does patient want to make changes to medical advance directive? No - Patient declined  Copy of [place] in Chart? -  Would patient like information on creating a medical advance directive? -  Pre-existing out of facility DNR order (yellow form or pink MOST form) -     Chief Complaint  Patient presents with  . Acute Visit    wound check    HPI:  Pt is a 86 y.o. female seen today for an acute visit for wound check. 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 The area is enlarging in size and has surrounding dry crusted yellow areas and redness. No fever, pain, or swelling. The nurses are using santyl and foam dressing. One week ago there was a significant amt of yellow drainage and odor and the dressing changes have helped but some drainage and redness persists. Ms. Contino has declined treatment for the lesion.   Past Medical History:  Diagnosis Date  . 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 Breast cancer ([place])    left   . Centrilobular emphysema ([place]) 12/23/2017  . Chronic  anticoagulation   . Chronic atrial fibrillation ([place])   . Chronic respiratory failure with hypoxia ([place]) 03/26/2018  . CKD (chronic kidney disease), stage III   . Cor pulmonale, chronic ([place]) 06/13/2013   Pulmonary artery pressure 72 06/15/17 with severe tricuspid regurg  . Essential hypertension 06/13/2013  . Hypertension   . PAH (pulmonary artery hypertension) ([place]) 03/25/2017   Pulmonary artery pressure 72 06/15/17 with severe tricuspid regurg  . Pelvic fracture ([place]) 05/2016  . Permanent atrial fibrillation ([place]) 03/26/2018   Past Surgical History:  Procedure Laterality Date  . ABDOMINAL HYSTERECTOMY    . BREAST LUMPECTOMY      Allergies  Allergen Reactions  . Aspirin     Upset stomach  . Neosporin [Bacitracin-Polymyxin B]     Unsure of reaction  . Codeine Nausea Only    Nausea     Outpatient Encounter Medications as of 05/12/2019  Medication Sig  . acetaminophen (TYLENOL) 325 MG tablet Take 650 mg by mouth 2 (two) times daily.  Marland Kitchen allopurinol (ZYLOPRIM) 100 MG tablet Take 100 mg by mouth at bedtime.   . ALPRAZolam (XANAX) 0.5 MG tablet Take 1 tablet (0.5 mg total) by mouth at bedtime.  Marland Kitchen antiseptic oral rinse (BIOTENE) LIQD 15 mLs by Mouth Rinse route as needed for dry mouth.  . Calcium Carbonate-Vitamin D3 (CALCIUM 600-D) 600-400 MG-UNIT TABS Take 1 tablet by mouth daily.  Marland Kitchen dextromethorphan (DELSYM) 30 MG/5ML liquid Take 5 mLs by mouth every 4 (four) hours as needed for cough.   . diclofenac sodium (VOLTAREN) 1 % GEL Apply 2  g topically 4 (four) times daily. Right shoulder  . Eyelid Cleansers (OCUSOFT EYELID CLEANSING) PADS Apply 1 application topically daily.  . feeding supplement (BOOST HIGH PROTEIN) LIQD Take 1 Container by mouth daily.   Marland Kitchen HYDROcodone-acetaminophen (NORCO) 5-325 MG tablet Take 1 tablet by mouth every 6 (six) hours as needed for severe pain.  Marland Kitchen ipratropium (ATROVENT) 0.02 % nebulizer solution Take 2.5 mLs (0.5 mg total) by nebulization 3 (three) times  daily.  Marland Kitchen levalbuterol (XOPENEX) 1.25 MG/0.5ML nebulizer solution Take 1.25 mg by nebulization 3 (three) times daily.  Marland Kitchen levalbuterol (XOPENEX) 1.25 MG/3ML nebulizer solution Take 1.25 mg by nebulization every 6 (six) hours as needed for wheezing or shortness of breath. In addition to three times daily schedule  . loratadine (CLARITIN) 10 MG tablet Take 10 mg by mouth at bedtime.  . metoprolol succinate (TOPROL-XL) 25 MG 24 hr tablet Take 12.5 mg by mouth daily.  . OXYGEN Level 3 or 3 mL/minute when out of room every shift 1, 2, and 3  . Polyethyl Glyc-Propyl Glyc PF (SYSTANE PRESERVATIVE FREE) 0.4-0.3 % SOLN Apply 1 drop to eye 3 (three) times daily as needed (dry eyes). (Patient taking differently: Apply 2 drops to eye in the morning and at bedtime. And tid prn)  . polyethylene glycol (MIRALAX / GLYCOLAX) 17 g packet Take 17 g by mouth daily.   Marland Kitchen spironolactone (ALDACTONE) 25 MG tablet Take 12.5 mg by mouth daily.  Marland Kitchen torsemide (DEMADEX) 20 MG tablet Take 40 mg by mouth daily.   Marland Kitchen triamcinolone cream (KENALOG) 0.1 % Apply 1 application topically as needed. To right arm for itching  . Amino Acids-Protein Hydrolys (FEEDING SUPPLEMENT, PRO-STAT SUGAR FREE 64,) LIQD 1 fl oz by mouth daily  . benzonatate (TESSALON) 100 MG capsule Take 100 mg by mouth 3 (three) times daily as needed for cough.  . warfarin (COUMADIN) 1 MG tablet Take 1 mg by mouth every other day. M, W, F  . warfarin (COUMADIN) 2 MG tablet Take 1 tablet (2 mg total) by mouth daily at 6 PM.  . [DISCONTINUED] ciprofloxacin (CILOXAN) 0.3 % ophthalmic solution Place 2 drops into both eyes every 4 (four) hours while awake. X 5 days   No facility-administered encounter medications on file as of 05/12/2019.    Review of Systems  Constitutional: Negative for activity change, appetite change, chills, diaphoresis, fatigue, fever and unexpected weight change.  Skin: Positive for color change and wound. Negative for pallor and rash.     Immunization History  Administered Date(s) Administered  . Influenza Split 12/07/2014  . Influenza, High Dose Seasonal PF 12/15/2018  . Influenza-Unspecified 11/05/2013, 12/02/2016, 12/16/2017  . Moderna SARS-COVID-2 Vaccination 03/07/2019, 04/04/2019  . Pneumococcal Polysaccharide-23 03/02/2012  . Tdap 03/02/2012  . Zoster 03/03/2011   Pertinent  Health Maintenance Due  Topic Date Due  . PNA vac Low Risk Adult (2 of 2 - PCV13) 03/02/2013  . INFLUENZA VACCINE  Completed  . DEXA SCAN  Completed   Fall Risk  03/30/2019 01/13/2019  Falls in the past year? 1 -  Number falls in past yr: 1 -  Injury with Fall? 1 -  Risk for fall due to : History of fall(s) History of fall(s);Impaired balance/gait;Impaired mobility;Medication side effect;Mental status change  Follow up Falls evaluation completed Falls evaluation completed;Education provided;Falls prevention discussed  Comment - done at [facility] as part of care plan   Functional Status Survey:    Vitals:   05/12/19 1047  BP: 105/70  Pulse: 68  Resp: 18  Temp: (!) 96.4 F (35.8 C)  SpO2: 99%   There is no height or weight on file to calculate BMI. Physical Exam Vitals and nursing note reviewed.  Skin:    Comments: Right forearm with large raised pink 100% tissue lesion with surrounding erythema and yellow crusted drainage .  No tenderness, warmth, or swelling. +CMS to the right hand  Neurological:     Mental Status: She is alert and oriented to person, place, and time.     Labs reviewed: Recent Labs    10/31/18 0000 11/01/18 0600 11/14/18 0000  NA 136* 136* 143  K 4.5 4.5 4.0  BUN 61* 61* 49*  CREATININE 1.2* 1.2* 1.1   No results for input(s): AST, ALT, ALKPHOS, BILITOT, PROT, ALBUMIN in the last 8760 hours. Recent Labs    11/01/18 0000 11/01/18 0600  WBC 5.9 5.9  HGB 12.8 12.8  HCT 38 38  PLT 167 167   Lab Results  Component Value Date   TSH 1.602 11/18/2017   No results found for: HGBA1C No results  found for: CHOL, HDL, LDLCALC, LDLDIRECT, TRIG, CHOLHDL  Significant Diagnostic Results in last 30 days:  No results found.  Assessment/Plan 1. Neoplasm of uncertain behavior of skin Resident has declined biopsy. Continue dressing changes which include cleanse with normal saline, apply foam dressing and change every other day with santyl  2. Wound infection Keflex 500 mg qid x 7 days      Family/ staff Communication: resident and nurse  Labs/tests ordered: INR due 3/15

## 2019-05-15 ENCOUNTER — Encounter: Payer: Self-pay | Admitting: Adult Health

## 2019-05-15 ENCOUNTER — Non-Acute Institutional Stay (SKILLED_NURSING_FACILITY): Payer: Medicare Other | Admitting: Adult Health

## 2019-05-15 DIAGNOSIS — I4891 Unspecified atrial fibrillation: Secondary | ICD-10-CM

## 2019-05-15 DIAGNOSIS — D6869 Other thrombophilia: Secondary | ICD-10-CM

## 2019-05-15 DIAGNOSIS — Z7901 Long term (current) use of anticoagulants: Secondary | ICD-10-CM

## 2019-05-15 LAB — PROTIME-INR: Protime: 23.3 — AB (ref 10.0–13.8)

## 2019-05-15 LAB — POCT INR: INR: 1.9 — AB (ref 0.9–1.1)

## 2019-05-15 NOTE — Progress Notes (Signed)
Location:  [facility] of Service:  SNF (31) Provider:   Cindi Carbon, ANP [place] [phone]   [name], DO  Patient Care Team: [name], DO as PCP - General (Geriatric Medicine) Sanda Klein, MD as PCP - Cardiology (Cardiology)  Extended Emergency Contact Information Primary Emergency Contact: [name] of [place] Phone: [phone] Work Phone: [phone] Mobile Phone: [phone] Relation: Daughter Secondary Emergency Contact: Louine, [place] Work Phone: [phone] Mobile Phone: [phone] Relation: Son  Code Status:  DNR Goals of care: Advanced Directive information Advanced Directives 04/25/2019  Does Patient Have a Medical Advance Directive? Yes  Type of Advance Directive Out of facility DNR (pink MOST or yellow form)  Does patient want to make changes to medical advance directive? No - Patient declined  Copy of [place] in Chart? -  Would patient like information on creating a medical advance directive? -  Pre-existing out of facility DNR order (yellow form or pink MOST form) -     Chief Complaint  Patient presents with  . Acute Visit    long term anticoagulation    HPI:  Pt is a 86 y.o. female seen today for coumadin management. She is on therapy due to a hx of afib.She reports easy bruising. No excessive bleeding.  No complaints from the nursing staff.  CHA2DS2 VASc: 6  Started on keflex on 3/15 due to wound infection in the right arm.   Lab Results  Component Value Date   INR 1.9 (A) 05/15/2019   INR 2.2 (A) 04/03/2019   INR 2.3 (A) 03/20/2019   PROTIME 23.3 (A) 05/15/2019   PROTIME 26.1 (A) 04/03/2019   PROTIME 24.0 (A) 03/20/2019      Past Medical History:  Diagnosis Date  . Arterial leg ulcer ([place]) 10/14/2018  . Breast cancer ([place])    left   . Centrilobular emphysema ([place]) 12/23/2017  . Chronic anticoagulation   . Chronic  atrial fibrillation ([place])   . Chronic respiratory failure with hypoxia ([place]) 03/26/2018  . CKD (chronic kidney disease), stage III   . Cor pulmonale, chronic ([place]) 06/13/2013   Pulmonary artery pressure 72 06/15/17 with severe tricuspid regurg  . Essential hypertension 06/13/2013  . Hypertension   . PAH (pulmonary artery hypertension) ([place]) 03/25/2017   Pulmonary artery pressure 72 06/15/17 with severe tricuspid regurg  . Pelvic fracture ([place]) 05/2016  . Permanent atrial fibrillation ([place]) 03/26/2018   Past Surgical History:  Procedure Laterality Date  . ABDOMINAL HYSTERECTOMY    . BREAST LUMPECTOMY      Allergies  Allergen Reactions  . Aspirin     Upset stomach  . Neosporin [Bacitracin-Polymyxin B]     Unsure of reaction  . Codeine Nausea Only    Nausea     Outpatient Encounter Medications as of 05/15/2019  Medication Sig  . acetaminophen (TYLENOL) 325 MG tablet Take 650 mg by mouth 2 (two) times daily.  Marland Kitchen allopurinol (ZYLOPRIM) 100 MG tablet Take 100 mg by mouth at bedtime.   . ALPRAZolam (XANAX) 0.5 MG tablet Take 1 tablet (0.5 mg total) by mouth at bedtime.  . Amino Acids-Protein Hydrolys (FEEDING SUPPLEMENT, PRO-STAT SUGAR FREE 64,) LIQD 1 fl oz by mouth daily  . antiseptic oral rinse (BIOTENE) LIQD 15 mLs by Mouth Rinse route as needed for dry mouth.  . benzonatate (TESSALON) 100 MG capsule Take 100 mg by mouth 3 (three) times daily as needed for cough.  Marland Kitchen  Calcium Carbonate-Vitamin D3 (CALCIUM 600-D) 600-400 MG-UNIT TABS Take 1 tablet by mouth daily.  . cephALEXin (KEFLEX) 500 MG capsule Take 500 mg by mouth 4 (four) times daily.  . collagenase (SANTYL) ointment Apply 1 application topically every other day.  Marland Kitchen dextromethorphan (DELSYM) 30 MG/5ML liquid Take 5 mLs by mouth every 4 (four) hours as needed for cough.   . diclofenac sodium (VOLTAREN) 1 % GEL Apply 2 g topically 4 (four) times daily. Right shoulder  . Eyelid Cleansers (OCUSOFT EYELID CLEANSING) PADS Apply 1  application topically daily.  . feeding supplement (BOOST HIGH PROTEIN) LIQD Take 1 Container by mouth daily.   Marland Kitchen HYDROcodone-acetaminophen (NORCO) 5-325 MG tablet Take 1 tablet by mouth every 6 (six) hours as needed for severe pain.  Marland Kitchen ipratropium (ATROVENT) 0.02 % nebulizer solution Take 2.5 mLs (0.5 mg total) by nebulization 3 (three) times daily.  Marland Kitchen levalbuterol (XOPENEX) 1.25 MG/0.5ML nebulizer solution Take 1.25 mg by nebulization 3 (three) times daily.  Marland Kitchen levalbuterol (XOPENEX) 1.25 MG/3ML nebulizer solution Take 1.25 mg by nebulization every 6 (six) hours as needed for wheezing or shortness of breath. In addition to three times daily schedule  . loratadine (CLARITIN) 10 MG tablet Take 10 mg by mouth at bedtime.  . metoprolol succinate (TOPROL-XL) 25 MG 24 hr tablet Take 12.5 mg by mouth daily.  . OXYGEN Level 3 or 3 mL/minute when out of room every shift 1, 2, and 3  . Polyethyl Glyc-Propyl Glyc PF (SYSTANE PRESERVATIVE FREE) 0.4-0.3 % SOLN Apply 1 drop to eye 3 (three) times daily as needed (dry eyes). (Patient taking differently: Apply 2 drops to eye in the morning and at bedtime. And tid prn)  . polyethylene glycol (MIRALAX / GLYCOLAX) 17 g packet Take 17 g by mouth daily.   Marland Kitchen spironolactone (ALDACTONE) 25 MG tablet Take 12.5 mg by mouth daily.  Marland Kitchen torsemide (DEMADEX) 20 MG tablet Take 40 mg by mouth daily.   Marland Kitchen triamcinolone cream (KENALOG) 0.1 % Apply 1 application topically as needed. To right arm for itching  . warfarin (COUMADIN) 1 MG tablet Take 1 mg by mouth every other day. M, W, F  . warfarin (COUMADIN) 2 MG tablet Take 1 tablet (2 mg total) by mouth daily at 6 PM.   No facility-administered encounter medications on file as of 05/15/2019.    Review of Systems  Constitutional: Negative for activity change, appetite change, chills, diaphoresis, fatigue, fever and unexpected weight change.  Hematological: Bruises/bleeds easily.    Denies excessive bleeding or bruising .   Immunization History  Administered Date(s) Administered  . Influenza Split 12/07/2014  . Influenza, High Dose Seasonal PF 12/15/2018  . Influenza-Unspecified 11/05/2013, 12/02/2016, 12/16/2017  . Moderna SARS-COVID-2 Vaccination 03/07/2019, 04/04/2019  . Pneumococcal Polysaccharide-23 03/02/2012  . Tdap 03/02/2012  . Zoster 03/03/2011   Pertinent  Health Maintenance Due  Topic Date Due  . PNA vac Low Risk Adult (2 of 2 - PCV13) 03/02/2013  . INFLUENZA VACCINE  Completed  . DEXA SCAN  Completed   Fall Risk  03/30/2019 01/13/2019  Falls in the past year? 1 -  Number falls in past yr: 1 -  Injury with Fall? 1 -  Risk for fall due to : History of fall(s) History of fall(s);Impaired balance/gait;Impaired mobility;Medication side effect;Mental status change  Follow up Falls evaluation completed Falls evaluation completed;Education provided;Falls prevention discussed  Comment - done at [facility] as part of care plan   Functional Status Survey:    There were no  vitals filed for this visit. There is no height or weight on file to calculate BMI. Physical Exam Constitutional:      Appearance: Normal appearance.  Neurological:     Mental Status: She is alert.     Labs reviewed: Recent Labs    10/31/18 0000 11/01/18 0600 11/14/18 0000  NA 136* 136* 143  K 4.5 4.5 4.0  BUN 61* 61* 49*  CREATININE 1.2* 1.2* 1.1   No results for input(s): AST, ALT, ALKPHOS, BILITOT, PROT, ALBUMIN in the last 8760 hours. Recent Labs    11/01/18 0000 11/01/18 0600  WBC 5.9 5.9  HGB 12.8 12.8  HCT 38 38  PLT 167 167   Lab Results  Component Value Date   TSH 1.602 11/18/2017   No results found for: HGBA1C No results found for: CHOL, HDL, LDLCALC, LDLDIRECT, TRIG, CHOLHDL  Significant Diagnostic Results in last 30 days:  No results found.  Assessment/Plan 1. Hypercoagulable state due to atrial fibrillation (HCC) Rate is controlled.  Possibly trended down due to antibiotic therapy  Given one dose of 4 mg today, then return to previous regimen as below  Coumadin 21m M, W, and F and 2 mg Sun, Tues, Thurs, and Sat Recheck INR in 1 week  2. Current use of long term anticoagulation As above    Family/ staff Communication: resident and staff  Labs/tests ordered:  INR 1 week

## 2019-05-18 ENCOUNTER — Non-Acute Institutional Stay (SKILLED_NURSING_FACILITY): Payer: Medicare Other | Admitting: Adult Health

## 2019-05-18 DIAGNOSIS — I4821 Permanent atrial fibrillation: Secondary | ICD-10-CM | POA: Diagnosis not present

## 2019-05-18 DIAGNOSIS — D485 Neoplasm of uncertain behavior of skin: Secondary | ICD-10-CM

## 2019-05-18 DIAGNOSIS — N1831 Chronic kidney disease, stage 3a: Secondary | ICD-10-CM

## 2019-05-18 DIAGNOSIS — J9611 Chronic respiratory failure with hypoxia: Secondary | ICD-10-CM

## 2019-05-18 DIAGNOSIS — H0100B Unspecified blepharitis left eye, upper and lower eyelids: Secondary | ICD-10-CM | POA: Diagnosis not present

## 2019-05-18 DIAGNOSIS — I2721 Secondary pulmonary arterial hypertension: Secondary | ICD-10-CM

## 2019-05-18 DIAGNOSIS — J432 Centrilobular emphysema: Secondary | ICD-10-CM | POA: Diagnosis not present

## 2019-05-18 DIAGNOSIS — H5789 Other specified disorders of eye and adnexa: Secondary | ICD-10-CM | POA: Diagnosis not present

## 2019-05-18 DIAGNOSIS — Z23 Encounter for immunization: Secondary | ICD-10-CM

## 2019-05-18 DIAGNOSIS — H0100A Unspecified blepharitis right eye, upper and lower eyelids: Secondary | ICD-10-CM

## 2019-05-18 NOTE — Progress Notes (Signed)
Location:  Occup[facility] psychologist of Service:  SNF (31) Provider:   Cindi Carbon, ANP [place] [phone]  [name], DO  Patient Care Team: [name], DO as PCP - General (Geriatric Medicine) Sanda Klein, MD as PCP - Cardiology (Cardiology)  Extended Emergency Contact Information Primary Emergency Contact: [name] of [place] Phone: [phone] Work Phone: [phone] Mobile Phone: [phone] Relation: Daughter Secondary Emergency Contact: Zarai, [place] Work Phone: [phone] Mobile Phone: [phone] Relation: Son  Code Status:  DNR Goals of care: Advanced Directive information Advanced Directives 04/25/2019  Does Patient Have a Medical Advance Directive? Yes  Type of Advance Directive Out of facility DNR (pink MOST or yellow form)  Does patient want to make changes to medical advance directive? No - Patient declined  Copy of [place] in Chart? -  Would patient like information on creating a medical advance directive? -  Pre-existing out of facility DNR order (yellow form or pink MOST form) -     Chief Complaint  Patient presents with  . Acute Visit    eye redness and pain  . Medical Management of Chronic Issues    HPI:  Pt is a 86 y.o. female seen today for medical management of chronic diseases.    She had right eye erythema and swelling due to possible bacterial infection and completed 5 days of Cipro 3/9-3/14 with no improvement. Reports are continued irritation, pain in the eye, swelling and erythema. Watering noted. Purulent drainage to both eye lashes is chronic due to blepharitis and she uses Ocuscrub and Systane for this issue.   Also on Keflex for wound infection to right arm neoplasm with associated wound infection 3/12-3/20.  Wound has shown improvement per nurse with less drainage and redness.   Weight is stable, has chronic DOE that is unchanged and  continues on oxygen at 3 liters. Using scheduled nebs but no prn.  Past Medical History:  Diagnosis Date  . Arterial leg ulcer ([place]) 10/14/2018  . Breast cancer ([place])    left   . Centrilobular emphysema ([place]) 12/23/2017  . Chronic anticoagulation   . Chronic atrial fibrillation ([place])   . Chronic respiratory failure with hypoxia ([place]) 03/26/2018  . CKD (chronic kidney disease), stage III   . Cor pulmonale, chronic ([place]) 06/13/2013   Pulmonary artery pressure 72 06/15/17 with severe tricuspid regurg  . Essential hypertension 06/13/2013  . Hypertension   . PAH (pulmonary artery hypertension) ([place]) 03/25/2017   Pulmonary artery pressure 72 06/15/17 with severe tricuspid regurg  . Pelvic fracture ([place]) 05/2016  . Permanent atrial fibrillation ([place]) 03/26/2018   Past Surgical History:  Procedure Laterality Date  . ABDOMINAL HYSTERECTOMY    . BREAST LUMPECTOMY      Allergies  Allergen Reactions  . Aspirin     Upset stomach  . Neosporin [Bacitracin-Polymyxin B]     Unsure of reaction  . Codeine Nausea Only    Nausea     Outpatient Encounter Medications as of 05/18/2019  Medication Sig  . acetaminophen (TYLENOL) 325 MG tablet Take 650 mg by mouth 2 (two) times daily.  Marland Kitchen allopurinol (ZYLOPRIM) 100 MG tablet Take 100 mg by mouth at bedtime.   . ALPRAZolam (XANAX) 0.5 MG tablet Take 1 tablet (0.5 mg total) by mouth at bedtime.  . Amino Acids-Protein Hydrolys (FEEDING SUPPLEMENT, PRO-STAT SUGAR FREE 64,) LIQD 1 fl oz by mouth daily  . antiseptic oral rinse (BIOTENE) LIQD 15  mLs by Mouth Rinse route as needed for dry mouth.  . benzonatate (TESSALON) 100 MG capsule Take 100 mg by mouth 3 (three) times daily as needed for cough.  . Calcium Carbonate-Vitamin D3 (CALCIUM 600-D) 600-400 MG-UNIT TABS Take 1 tablet by mouth daily.  . cephALEXin (KEFLEX) 500 MG capsule Take 500 mg by mouth 4 (four) times daily.  . collagenase (SANTYL) ointment Apply 1 application topically every other day.  Marland Kitchen  dextromethorphan (DELSYM) 30 MG/5ML liquid Take 5 mLs by mouth every 4 (four) hours as needed for cough.   . diclofenac sodium (VOLTAREN) 1 % GEL Apply 2 g topically 4 (four) times daily. Right shoulder  . Eyelid Cleansers (OCUSOFT EYELID CLEANSING) PADS Apply 1 application topically daily.  . feeding supplement (BOOST HIGH PROTEIN) LIQD Take 1 Container by mouth daily.   Marland Kitchen HYDROcodone-acetaminophen (NORCO) 5-325 MG tablet Take 1 tablet by mouth every 6 (six) hours as needed for severe pain.  Marland Kitchen ipratropium (ATROVENT) 0.02 % nebulizer solution Take 2.5 mLs (0.5 mg total) by nebulization 3 (three) times daily.  Marland Kitchen levalbuterol (XOPENEX) 1.25 MG/0.5ML nebulizer solution Take 1.25 mg by nebulization 3 (three) times daily.  Marland Kitchen levalbuterol (XOPENEX) 1.25 MG/3ML nebulizer solution Take 1.25 mg by nebulization every 6 (six) hours as needed for wheezing or shortness of breath. In addition to three times daily schedule  . loratadine (CLARITIN) 10 MG tablet Take 10 mg by mouth at bedtime.  . metoprolol succinate (TOPROL-XL) 25 MG 24 hr tablet Take 12.5 mg by mouth daily.  . OXYGEN Level 3 or 3 mL/minute when out of room every shift 1, 2, and 3  . Polyethyl Glyc-Propyl Glyc PF (SYSTANE PRESERVATIVE FREE) 0.4-0.3 % SOLN Apply 1 drop to eye 3 (three) times daily as needed (dry eyes). (Patient taking differently: Apply 2 drops to eye in the morning and at bedtime. And tid prn)  . polyethylene glycol (MIRALAX / GLYCOLAX) 17 g packet Take 17 g by mouth daily.   Marland Kitchen spironolactone (ALDACTONE) 25 MG tablet Take 12.5 mg by mouth daily.  Marland Kitchen torsemide (DEMADEX) 20 MG tablet Take 40 mg by mouth daily.   Marland Kitchen triamcinolone cream (KENALOG) 0.1 % Apply 1 application topically as needed. To right arm for itching  . warfarin (COUMADIN) 1 MG tablet Take 1 mg by mouth every other day. M, W, F  . warfarin (COUMADIN) 2 MG tablet Take 1 tablet (2 mg total) by mouth daily at 6 PM.   No facility-administered encounter medications on file  as of 05/18/2019.    Review of Systems  Constitutional: Negative for activity change, appetite change, chills, diaphoresis, fatigue, fever and unexpected weight change.  HENT: Negative for congestion.   Eyes: Positive for pain, discharge and redness. Negative for photophobia, itching and visual disturbance.  Respiratory: Positive for shortness of breath (on exertion). Negative for cough and wheezing.   Cardiovascular: Negative for chest pain, palpitations and leg swelling.  Gastrointestinal: Negative for abdominal distention, abdominal pain, constipation and diarrhea.  Genitourinary: Negative for difficulty urinating and dysuria.  Musculoskeletal: Positive for arthralgias and gait problem. Negative for back pain, joint swelling and myalgias.  Skin: Positive for wound.  Neurological: Negative for dizziness, tremors, seizures, syncope, facial asymmetry, speech difficulty, weakness, light-headedness, numbness and headaches.  Psychiatric/Behavioral: Negative for agitation, behavioral problems and confusion.    Immunization History  Administered Date(s) Administered  . Influenza Split 12/07/2014  . Influenza, High Dose Seasonal PF 12/15/2018  . Influenza-Unspecified 11/05/2013, 12/02/2016, 12/16/2017  . [place] SARS-COVID-2 Vaccination  03/07/2019, 04/04/2019  . Pneumococcal Polysaccharide-23 03/02/2012  . Tdap 03/02/2012  . Zoster 03/03/2011   Pertinent  Health Maintenance Due  Topic Date Due  . PNA vac Low Risk Adult (2 of 2 - PCV13) 03/02/2013  . INFLUENZA VACCINE  Completed  . DEXA SCAN  Completed   Fall Risk  03/30/2019 01/13/2019  Falls in the past year? 1 -  Number falls in past yr: 1 -  Injury with Fall? 1 -  Risk for fall due to : History of fall(s) History of fall(s);Impaired balance/gait;Impaired mobility;Medication side effect;Mental status change  Follow up Falls evaluation completed Falls evaluation completed;Education provided;Falls prevention discussed  Comment - done  at [facility] as part of care plan   Functional Status Survey:    Vitals:   05/18/19 1451  BP: 99/65  Pulse: 80  Resp: 19  Temp: (!) 97.3 F (36.3 C)  SpO2: 95%   There is no height or weight on file to calculate BMI. Physical Exam Vitals and nursing note reviewed.  Constitutional:      General: She is not in acute distress.    Appearance: She is not diaphoretic.  HENT:     Head: Normocephalic and atraumatic.     Mouth/Throat:     Mouth: Mucous membranes are moist.     Pharynx: Oropharynx is clear. No oropharyngeal exudate.  Eyes:     Extraocular Movements:     Right eye: Normal extraocular motion.     Left eye: Normal extraocular motion.     Conjunctiva/sclera:     Right eye: Right conjunctiva is injected. Chemosis present. No exudate or hemorrhage.    Left eye: Left conjunctiva is not injected. No chemosis, exudate or hemorrhage.    Pupils: Pupils are equal, round, and reactive to light.   Neck:     Vascular: No JVD.  Cardiovascular:     Rate and Rhythm: Normal rate and regular rhythm.     Heart sounds: No murmur.  Pulmonary:     Effort: Pulmonary effort is normal. No respiratory distress.     Breath sounds: Normal breath sounds. No wheezing.  Abdominal:     General: Bowel sounds are normal.     Palpations: Abdomen is soft.  Musculoskeletal:     Right lower leg: No edema.     Left lower leg: No edema.  Skin:    General: Skin is warm and dry.     Comments: Right forearm skin lesion 100% pink tissue, round. No drainage or swelling or tenderness. Surrounding skin from low to mid forearm with pink tissue, some maceration, decreased drainage and erythema.   Neurological:     Mental Status: She is alert and oriented to person, place, and time.  Psychiatric:        Mood and Affect: Mood normal.     Labs reviewed: Recent Labs    10/31/18 0000 11/01/18 0600 11/14/18 0000  NA 136* 136* 143  K 4.5 4.5 4.0  BUN 61* 61* 49*  CREATININE 1.2* 1.2* 1.1   No  results for input(s): AST, ALT, ALKPHOS, BILITOT, PROT, ALBUMIN in the last 8760 hours. Recent Labs    11/01/18 0000 11/01/18 0600  WBC 5.9 5.9  HGB 12.8 12.8  HCT 38 38  PLT 167 167   Lab Results  Component Value Date   TSH 1.602 11/18/2017   No results found for: HGBA1C No results found for: CHOL, HDL, LDLCALC, LDLDIRECT, TRIG, CHOLHDL  Significant Diagnostic Results in last 30 days:  No results found.  Assessment/Plan 1. Eye inflammation She has a ciliary flush and pain and I am concerned for an inflammatory process or corneal ulceration. Needs thorough eye exam. Follow up with ophthalmology ASAP.  2. Permanent atrial fibrillation (HCC) Rate controlled. Continue Toprol XL 12.5 mg qd and coumadin for CVA risk reduction   3. PAH (pulmonary artery hypertension) ([place]) Wt Readings from Last 3 Encounters:  04/25/19 144 lb 3.2 oz (65.4 kg)  03/30/19 144 lb 9.6 oz (65.6 kg)  03/30/19 144 lb 9.6 oz (65.6 kg)  Continue torsemide 40 mg qd and aldactone 12.5 mg qd   4. Centrilobular emphysema (HCC) No exacerbations. Continue Xopenex and Atrovent tid.   5. Chronic respiratory failure with hypoxia (HCC) Continue oxygen at 3 liters  6. Stage 3a chronic kidney disease Continue to periodically monitor BMP and avoid nephrotoxic agents. Encourage hydration.   7. Immunization due Please give Prenar 13 if no verification of dose given at Dr. [facility] [employer] office  8. Blepharitis of upper and lower eyelids of both eyes, unspecified type Controlled. Continue Ocuscrub and Systane.  9. Neoplasm of uncertain behavior of skin Declined surgery. Continues with dressing changes to include santyl. Continue and complete Keflex. Improved surrounding skin but skin lesion itself continues to grow which is to be expected.     Family/ staff Communication: discussed with Deede, her daughter   Labs/tests ordered:  TSH CBC BMP with next INR

## 2019-05-19 ENCOUNTER — Encounter: Payer: Self-pay | Admitting: Adult Health

## 2019-05-19 DIAGNOSIS — B0052 Herpesviral keratitis: Secondary | ICD-10-CM | POA: Diagnosis not present

## 2019-05-22 ENCOUNTER — Encounter: Payer: Self-pay | Admitting: Adult Health

## 2019-05-22 ENCOUNTER — Non-Acute Institutional Stay (SKILLED_NURSING_FACILITY): Payer: Medicare Other | Admitting: Adult Health

## 2019-05-22 DIAGNOSIS — E039 Hypothyroidism, unspecified: Secondary | ICD-10-CM | POA: Diagnosis not present

## 2019-05-22 DIAGNOSIS — I4891 Unspecified atrial fibrillation: Secondary | ICD-10-CM | POA: Diagnosis not present

## 2019-05-22 DIAGNOSIS — Z7901 Long term (current) use of anticoagulants: Secondary | ICD-10-CM

## 2019-05-22 DIAGNOSIS — D649 Anemia, unspecified: Secondary | ICD-10-CM | POA: Diagnosis not present

## 2019-05-22 DIAGNOSIS — B0052 Herpesviral keratitis: Secondary | ICD-10-CM | POA: Diagnosis not present

## 2019-05-22 DIAGNOSIS — I482 Chronic atrial fibrillation, unspecified: Secondary | ICD-10-CM | POA: Diagnosis not present

## 2019-05-22 DIAGNOSIS — D6869 Other thrombophilia: Secondary | ICD-10-CM

## 2019-05-22 DIAGNOSIS — D509 Iron deficiency anemia, unspecified: Secondary | ICD-10-CM | POA: Diagnosis not present

## 2019-05-22 LAB — PROTIME-INR: Protime: 24.7 — AB (ref 10.0–13.8)

## 2019-05-22 LAB — BASIC METABOLIC PANEL
BUN: 52 — AB (ref 4–21)
CO2: 30 — AB (ref 13–22)
Chloride: 93 — AB (ref 99–108)
Creatinine: 1.2 — AB (ref 0.5–1.1)
Glucose: 117
Potassium: 4.4 (ref 3.4–5.3)
Sodium: 138 (ref 137–147)

## 2019-05-22 LAB — CBC AND DIFFERENTIAL
HCT: 43 (ref 36–46)
Hemoglobin: 14.4 (ref 12.0–16.0)
Platelets: 212 (ref 150–399)
WBC: 12.5

## 2019-05-22 LAB — POCT INR
INR: 2.2 — AB (ref 0.9–1.1)
INR: 2.2 — AB (ref 0.9–1.1)

## 2019-05-22 LAB — TSH: TSH: 6.46 — AB (ref 0.41–5.90)

## 2019-05-22 LAB — CBC: RBC: 4.18 (ref 3.87–5.11)

## 2019-05-22 LAB — COMPREHENSIVE METABOLIC PANEL: Calcium: 10.8 — AB (ref 8.7–10.7)

## 2019-05-22 NOTE — Progress Notes (Signed)
Location:  [facility] of Service:  SNF (31) Provider:   Cindi Carbon, ANP [facility] [phone]   [name], DO  Patient Care Team: [name], DO as PCP - General (Geriatric Medicine) Sanda Klein, MD as PCP - Cardiology (Cardiology)  Extended Emergency Contact Information Primary Emergency Contact: [name] of [place] Phone: [phone] Work Phone: [phone] Mobile Phone: [phone] Relation: Daughter Secondary Emergency Contact: Hedy, Garro Work Phone: [phone] Mobile Phone: [phone] Relation: Son  Code Status:  DNR Goals of care: Advanced Directive information Advanced Directives 04/25/2019  Does Patient Have a Medical Advance Directive? Yes  Type of Advance Directive Out of facility DNR (pink MOST or yellow form)  Does patient want to make changes to medical advance directive? No - Patient declined  Copy of [place] in Chart? -  Would patient like information on creating a medical advance directive? -  Pre-existing out of facility DNR order (yellow form or pink MOST form) -     Chief Complaint  Patient presents with  . Acute Visit    coumadin management     HPI:  Pt is a 86 y.o. female seen today for coumadin management. She is on therapy due to a hx of afib. No excessive bleeding.  No complaints from the nursing staff.  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    Lab Results  Component Value Date   INR 2.2 (A) 05/22/2019   INR 1.9 (A) 05/15/2019   INR 2.2 (A) 04/03/2019   PROTIME 24.7 (A) 05/22/2019   PROTIME 23.3 (A) 05/15/2019   PROTIME 26.1 (A) 04/03/2019      Past Medical History:  Diagnosis Date  . Arterial leg ulcer ([place]) 10/14/2018  . Breast cancer ([place])    left   . Centrilobular emphysema ([place]) 12/23/2017  . Chronic anticoagulation   . Chronic atrial fibrillation ([place])   . Chronic respiratory  failure with hypoxia ([place]) 03/26/2018  . CKD (chronic kidney disease), stage III   . Cor pulmonale, chronic ([place]) 06/13/2013   Pulmonary artery pressure 72 06/15/17 with severe tricuspid regurg  . Essential hypertension 06/13/2013  . Hypertension   . PAH (pulmonary artery hypertension) ([place]) 03/25/2017   Pulmonary artery pressure 72 06/15/17 with severe tricuspid regurg  . Pelvic fracture ([place]) 05/2016  . Permanent atrial fibrillation ([place]) 03/26/2018   Past Surgical History:  Procedure Laterality Date  . ABDOMINAL HYSTERECTOMY    . BREAST LUMPECTOMY      Allergies  Allergen Reactions  . Aspirin     Upset stomach  . Neosporin [Bacitracin-Polymyxin B]     Unsure of reaction  . Codeine Nausea Only    Nausea     Outpatient Encounter Medications as of 05/22/2019  Medication Sig  . Loteprednol-Tobramycin (ZYLET) 0.5-0.3 % SUSP Place 1 drop into the right eye 4 (four) times daily.  . valACYclovir (VALTREX) 1000 MG tablet Take 1,000 mg by mouth 3 (three) times daily. HSV kerartitis  . acetaminophen (TYLENOL) 325 MG tablet Take 650 mg by mouth 2 (two) times daily.  Marland Kitchen allopurinol (ZYLOPRIM) 100 MG tablet Take 100 mg by mouth at bedtime.   . ALPRAZolam (XANAX) 0.5 MG tablet Take 1 tablet (0.5 mg total) by mouth at bedtime.  . Amino Acids-Protein Hydrolys (FEEDING SUPPLEMENT, PRO-STAT SUGAR FREE 64,) LIQD 1 fl oz by mouth daily  . antiseptic oral rinse (BIOTENE) LIQD 15 mLs by Mouth  Rinse route as needed for dry mouth.  . benzonatate (TESSALON) 100 MG capsule Take 100 mg by mouth 3 (three) times daily as needed for cough.  . Calcium Carbonate-Vitamin D3 (CALCIUM 600-D) 600-400 MG-UNIT TABS Take 1 tablet by mouth daily.  . collagenase (SANTYL) ointment Apply 1 application topically every other day.  Marland Kitchen dextromethorphan (DELSYM) 30 MG/5ML liquid Take 5 mLs by mouth every 4 (four) hours as needed for cough.   . diclofenac sodium (VOLTAREN) 1 % GEL Apply 2 g topically 4 (four) times daily. Right  shoulder  . Eyelid Cleansers (OCUSOFT EYELID CLEANSING) PADS Apply 1 application topically daily.  . feeding supplement (BOOST HIGH PROTEIN) LIQD Take 1 Container by mouth daily.   Marland Kitchen HYDROcodone-acetaminophen (NORCO) 5-325 MG tablet Take 1 tablet by mouth every 6 (six) hours as needed for severe pain.  Marland Kitchen ipratropium (ATROVENT) 0.02 % nebulizer solution Take 2.5 mLs (0.5 mg total) by nebulization 3 (three) times daily.  Marland Kitchen levalbuterol (XOPENEX) 1.25 MG/0.5ML nebulizer solution Take 1.25 mg by nebulization 3 (three) times daily.  Marland Kitchen levalbuterol (XOPENEX) 1.25 MG/3ML nebulizer solution Take 1.25 mg by nebulization every 6 (six) hours as needed for wheezing or shortness of breath. In addition to three times daily schedule  . loratadine (CLARITIN) 10 MG tablet Take 10 mg by mouth at bedtime.  . metoprolol succinate (TOPROL-XL) 25 MG 24 hr tablet Take 12.5 mg by mouth daily.  . OXYGEN Level 3 or 3 mL/minute when out of room every shift 1, 2, and 3  . Polyethyl Glyc-Propyl Glyc PF (SYSTANE PRESERVATIVE FREE) 0.4-0.3 % SOLN Apply 1 drop to eye 3 (three) times daily as needed (dry eyes). (Patient taking differently: Apply 2 drops to eye in the morning and at bedtime. And tid prn)  . polyethylene glycol (MIRALAX / GLYCOLAX) 17 g packet Take 17 g by mouth daily.   Marland Kitchen spironolactone (ALDACTONE) 25 MG tablet Take 12.5 mg by mouth daily.  Marland Kitchen torsemide (DEMADEX) 20 MG tablet Take 40 mg by mouth daily.   Marland Kitchen triamcinolone cream (KENALOG) 0.1 % Apply 1 application topically as needed. To right arm for itching  . warfarin (COUMADIN) 1 MG tablet Take 1 mg by mouth every other day. M, W, F  . [DISCONTINUED] cephALEXin (KEFLEX) 500 MG capsule Take 500 mg by mouth 4 (four) times daily.  . [DISCONTINUED] warfarin (COUMADIN) 2 MG tablet Take 1 tablet (2 mg total) by mouth daily at 6 PM.   No facility-administered encounter medications on file as of 05/22/2019.    Review of Systems  Constitutional: Negative for activity  change, appetite change, chills, diaphoresis, fatigue, fever and unexpected weight change.  Hematological: Bruises/bleeds easily.     Immunization History  Administered Date(s) Administered  . Influenza Split 12/07/2014  . Influenza, High Dose Seasonal PF 12/15/2018  . Influenza-Unspecified 11/05/2013, 12/02/2016, 12/16/2017  . Moderna SARS-COVID-2 Vaccination 03/07/2019, 04/04/2019  . Pneumococcal Polysaccharide-23 03/02/2012  . Tdap 03/02/2012  . Zoster 03/03/2011   Pertinent  Health Maintenance Due  Topic Date Due  . PNA vac Low Risk Adult (2 of 2 - PCV13) 03/02/2013  . INFLUENZA VACCINE  Completed  . DEXA SCAN  Completed   Fall Risk  03/30/2019 01/13/2019  Falls in the past year? 1 -  Number falls in past yr: 1 -  Injury with Fall? 1 -  Risk for fall due to : History of fall(s) History of fall(s);Impaired balance/gait;Impaired mobility;Medication side effect;Mental status change  Follow up Falls evaluation completed Falls evaluation completed;Education  provided;Falls prevention discussed  Comment - done at [facility] as part of care plan   Functional Status Survey:    There were no vitals filed for this visit. There is no height or weight on file to calculate BMI. Physical Exam Constitutional:      Appearance: Normal appearance.  Neurological:     Mental Status: She is alert.     Labs reviewed: Recent Labs    10/31/18 0000 11/01/18 0600 11/14/18 0000  NA 136* 136* 143  K 4.5 4.5 4.0  BUN 61* 61* 49*  CREATININE 1.2* 1.2* 1.1   No results for input(s): AST, ALT, ALKPHOS, BILITOT, PROT, ALBUMIN in the last 8760 hours. Recent Labs    11/01/18 0000 11/01/18 0600  WBC 5.9 5.9  HGB 12.8 12.8  HCT 38 38  PLT 167 167   Lab Results  Component Value Date   TSH 1.602 11/18/2017   No results found for: HGBA1C No results found for: CHOL, HDL, LDLCALC, LDLDIRECT, TRIG, CHOLHDL  Significant Diagnostic Results in last 30 days:  No results found.   Assessment/Plan 1. Hypercoagulable state due to atrial fibrillation (HCC) Rate is controlled.  Continues on Toprol for rate control and coumadin for CVA risk reduction    2. Current use of long term anticoagulation Continue Coumadin 31m M, W, F and 2 mg Tues, Thurs, Sat and Sunday  No noted interaction between coumadin and Valtrex that would warrant more frequent checks     Family/ staff Communication: resident and staff  Labs/tests ordered:  INR 2 weeks

## 2019-05-24 ENCOUNTER — Encounter: Payer: Self-pay | Admitting: Internal Medicine

## 2019-05-25 DIAGNOSIS — B0052 Herpesviral keratitis: Secondary | ICD-10-CM | POA: Diagnosis not present

## 2019-05-30 ENCOUNTER — Encounter: Payer: Self-pay | Admitting: Internal Medicine

## 2019-05-30 ENCOUNTER — Non-Acute Institutional Stay (SKILLED_NURSING_FACILITY): Payer: Medicare Other | Admitting: Internal Medicine

## 2019-05-30 DIAGNOSIS — D0461 Carcinoma in situ of skin of right upper limb, including shoulder: Secondary | ICD-10-CM | POA: Diagnosis not present

## 2019-05-30 DIAGNOSIS — D6869 Other thrombophilia: Secondary | ICD-10-CM

## 2019-05-30 DIAGNOSIS — I4891 Unspecified atrial fibrillation: Secondary | ICD-10-CM

## 2019-05-30 DIAGNOSIS — C761 Malignant neoplasm of thorax: Secondary | ICD-10-CM

## 2019-05-30 NOTE — Progress Notes (Signed)
Location:  [facility] Room Number: 137 Place of Service:  SNF (31) Provider:  Ellisha Bankson L. Mariea Clonts, D.O., C.M.D.  [name], DO  Patient Care Team: [name], DO as PCP - General (Geriatric Medicine) Sanda Klein, MD as PCP - Cardiology (Cardiology)  Extended Emergency Contact Information Primary Emergency Contact: [name] of [place] Phone: [phone] Work Phone: [phone] Mobile Phone: [phone] Relation: Daughter Secondary Emergency Contact: Mechel, Haggard Work Phone: [phone] Mobile Phone: [phone] Relation: Son  Code Status:  DNR Goals of care: Advanced Directive information Advanced Directives 05/30/2019  Does Patient Have a Medical Advance Directive? Yes  Type of Advance Directive Out of facility DNR (pink MOST or yellow form)  Does patient want to make changes to medical advance directive? No - Patient declined  Copy of [place] in Chart? -  Would patient like information on creating a medical advance directive? -  Pre-existing out of facility DNR order (yellow form or pink MOST form) Pink MOST/Yellow Form most recent copy in chart - Physician notified to receive inpatient order     Chief Complaint  Patient presents with  . Acute Visit    Redness and increased pain of her right chest wound    HPI:  Pt is a 86 y.o. female seen today for an acute visit for erythema and increased pain on right upper chest where she recently had derm procedure for skin cancer.  Dressing was changed this morning (mepilex was on it and area was extremely moist and material from wound stuck to the dressing causing a lot of pain for resident).  She has surrounding erythema on the upper outer aspect especially.  Also, of note, her right arm wounds from the skin cancer have a few red spots next to them and one follicle/whitehead present that the nurse manager was asking about.  Pt has been  treated recently with antibiotics for conjunctivitis and before that for cellulitis of her leg, arm wounds.  She is on coumadin which becomes erratic with antibiotics, as well.    Past Medical History:  Diagnosis Date  . Arterial leg ulcer ([place]) 10/14/2018  . Breast cancer ([place])    left   . Centrilobular emphysema ([place]) 12/23/2017  . Chronic anticoagulation   . Chronic atrial fibrillation ([place])   . Chronic respiratory failure with hypoxia ([place]) 03/26/2018  . CKD (chronic kidney disease), stage III   . Cor pulmonale, chronic ([place]) 06/13/2013   Pulmonary artery pressure 72 06/15/17 with severe tricuspid regurg  . Essential hypertension 06/13/2013  . Hypertension   . PAH (pulmonary artery hypertension) ([place]) 03/25/2017   Pulmonary artery pressure 72 06/15/17 with severe tricuspid regurg  . Pelvic fracture ([place]) 05/2016  . Permanent atrial fibrillation ([place]) 03/26/2018   Past Surgical History:  Procedure Laterality Date  . ABDOMINAL HYSTERECTOMY    . BREAST LUMPECTOMY      Allergies  Allergen Reactions  . Aspirin     Upset stomach  . Neosporin [Bacitracin-Polymyxin B]     Unsure of reaction  . Codeine Nausea Only    Nausea     Outpatient Encounter Medications as of 05/30/2019  Medication Sig  . acetaminophen (TYLENOL) 325 MG tablet Take 650 mg by mouth 2 (two) times daily.  Marland Kitchen allopurinol (ZYLOPRIM) 100 MG tablet Take 100 mg by mouth at bedtime.   . ALPRAZolam (XANAX) 0.5 MG tablet Take 1 tablet (0.5 mg total) by mouth at bedtime.  . Amino Acids-Protein  Hydrolys (FEEDING SUPPLEMENT, PRO-STAT SUGAR FREE 64,) LIQD 1 fl oz by mouth daily  . antiseptic oral rinse (BIOTENE) LIQD 15 mLs by Mouth Rinse route as needed for dry mouth.  . benzonatate (TESSALON) 100 MG capsule Take 100 mg by mouth 3 (three) times daily as needed for cough.  . Calcium Carbonate-Vitamin D3 (CALCIUM 600-D) 600-400 MG-UNIT TABS Take 1 tablet by mouth daily.  . collagenase (SANTYL) ointment Apply 1 application  topically every other day.  Marland Kitchen dextromethorphan (DELSYM) 30 MG/5ML liquid Take 5 mLs by mouth every 4 (four) hours as needed for cough.   . diclofenac sodium (VOLTAREN) 1 % GEL Apply 2 g topically 4 (four) times daily. Right shoulder  . Eyelid Cleansers (OCUSOFT EYELID CLEANSING) PADS Apply 1 application topically daily.  . feeding supplement (BOOST HIGH PROTEIN) LIQD Take 1 Container by mouth daily.   Marland Kitchen HYDROcodone-acetaminophen (NORCO) 5-325 MG tablet Take 1 tablet by mouth every 6 (six) hours as needed for severe pain.  Marland Kitchen ipratropium (ATROVENT) 0.02 % nebulizer solution Take 2.5 mLs (0.5 mg total) by nebulization 3 (three) times daily.  Marland Kitchen levalbuterol (XOPENEX) 1.25 MG/0.5ML nebulizer solution Take 1.25 mg by nebulization 3 (three) times daily.  Marland Kitchen levalbuterol (XOPENEX) 1.25 MG/3ML nebulizer solution Take 1.25 mg by nebulization every 6 (six) hours as needed for wheezing or shortness of breath. In addition to three times daily schedule  . loratadine (CLARITIN) 10 MG tablet Take 10 mg by mouth at bedtime.  Lita Mains (ZYLET) 0.5-0.3 % SUSP Place 1 drop into the right eye 4 (four) times daily.  . metoprolol succinate (TOPROL-XL) 25 MG 24 hr tablet Take 12.5 mg by mouth daily.  . OXYGEN Level 3 or 3 mL/minute when out of room every shift 1, 2, and 3  . Polyethyl Glyc-Propyl Glyc PF (SYSTANE PRESERVATIVE FREE) 0.4-0.3 % SOLN Apply 1 drop to eye 3 (three) times daily as needed (dry eyes). (Patient taking differently: Apply 2 drops to eye in the morning and at bedtime. And tid prn)  . polyethylene glycol (MIRALAX / GLYCOLAX) 17 g packet Take 17 g by mouth daily.   Marland Kitchen spironolactone (ALDACTONE) 25 MG tablet Take 12.5 mg by mouth daily.  Marland Kitchen torsemide (DEMADEX) 20 MG tablet Take 40 mg by mouth daily.   Marland Kitchen triamcinolone cream (KENALOG) 0.1 % Apply 1 application topically as needed. To right arm for itching  . valACYclovir (VALTREX) 1000 MG tablet Take 1,000 mg by mouth 3 (three) times daily. HSV  kerartitis  . warfarin (COUMADIN) 1 MG tablet Take 1 mg by mouth every other day. M, W, F  . warfarin (COUMADIN) 2 MG tablet Take 2 mg by mouth every other day. Tuesday, Thursday, Saturday, and Sunday   No facility-administered encounter medications on file as of 05/30/2019.    Review of Systems  Constitutional: Negative for chills and fever.  Skin:       Redness of chest wound site and rash by right arm wound    Immunization History  Administered Date(s) Administered  . Influenza Split 12/07/2014  . Influenza, High Dose Seasonal PF 12/15/2018  . Influenza-Unspecified 11/05/2013, 12/02/2016, 12/16/2017  . Moderna SARS-COVID-2 Vaccination 03/07/2019, 04/04/2019  . Pneumococcal Polysaccharide-23 03/02/2012  . Tdap 03/02/2012  . Zoster 03/03/2011   Pertinent  Health Maintenance Due  Topic Date Due  . PNA vac Low Risk Adult (2 of 2 - PCV13) 03/02/2013  . INFLUENZA VACCINE  Completed  . DEXA SCAN  Completed   Fall Risk  03/30/2019 01/13/2019  Falls in the past year? 1 -  Number falls in past yr: 1 -  Injury with Fall? 1 -  Risk for fall due to : History of fall(s) History of fall(s);Impaired balance/gait;Impaired mobility;Medication side effect;Mental status change  Follow up Falls evaluation completed Falls evaluation completed;Education provided;Falls prevention discussed  Comment - done at [facility] as part of care plan   Functional Status Survey:    Vitals:   05/30/19 1627  BP: 99/65  Pulse: 80  Temp: (!) 97.3 F (36.3 C)  SpO2: 95%  Weight: 144 lb 3.2 oz (65.4 kg)  Height: 5' 4" (1.626 m)   Body mass index is 24.75 kg/m. Physical Exam Vitals and nursing note reviewed.  Constitutional:      Appearance: Normal appearance.  Skin:    Comments: Erythema of upper outer aspect of wound area (suspect due to cancer itself), has eschar and yellow material in wound; right arm wound has small pustule and several erythematous spots on the arm, but nonpruritic and nontender  at that area, two large erythematous tumors visible on arm  Neurological:     Mental Status: She is alert.     Labs reviewed: Recent Labs    11/01/18 0600 11/14/18 0000 05/22/19 0600  NA 136* 143 138  K 4.5 4.0 4.4  CL  --   --  93*  CO2  --   --  30*  BUN 61* 49* 52*  CREATININE 1.2* 1.1 1.2*  CALCIUM  --   --  10.8*   No results for input(s): AST, ALT, ALKPHOS, BILITOT, PROT, ALBUMIN in the last 8760 hours. Recent Labs    11/01/18 0000 11/01/18 0600 05/22/19 0600  WBC 5.9 5.9 12.5  HGB 12.8 12.8 14.4  HCT 38 38 43  PLT 167 167 212   Lab Results  Component Value Date   TSH 6.46 (A) 05/22/2019   No results found for: HGBA1C No results found for: CHOL, HDL, LDLCALC, LDLDIRECT, TRIG, CHOLHDL  Significant Diagnostic Results in last 30 days:  No results found.  Assessment/Plan 1. 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 Hypercoagulable state due to atrial fibrillation (HCC) -on coumadin and at risk for bleeding or stroke with abx use to alter INR so must be careful with recurrent abx   Family/ staff Communication: discussed with snf nurse and wound care nurses  Labs/tests ordered:  No new  Jerrye Seebeck L. Michaele Amundson, D.O. [facility] [address], [place] [address] Cell Phone (Mon-Fri 8am-5pm):  [phone] On Call:  [phone] & follow prompts after 5pm & weekends Office Phone:  [phone] Office Fax:  [phone]

## 2019-06-05 ENCOUNTER — Non-Acute Institutional Stay (SKILLED_NURSING_FACILITY): Payer: Medicare Other | Admitting: Adult Health

## 2019-06-05 ENCOUNTER — Encounter: Payer: Self-pay | Admitting: Adult Health

## 2019-06-05 DIAGNOSIS — D0461 Carcinoma in situ of skin of right upper limb, including shoulder: Secondary | ICD-10-CM | POA: Diagnosis not present

## 2019-06-05 DIAGNOSIS — Z7901 Long term (current) use of anticoagulants: Secondary | ICD-10-CM

## 2019-06-05 DIAGNOSIS — I4891 Unspecified atrial fibrillation: Secondary | ICD-10-CM | POA: Diagnosis not present

## 2019-06-05 DIAGNOSIS — D6869 Other thrombophilia: Secondary | ICD-10-CM

## 2019-06-05 LAB — POCT INR: INR: 2.3 — AB (ref 0.9–1.1)

## 2019-06-05 LAB — PROTIME-INR: Protime: 27.5 — AB (ref 10.0–13.8)

## 2019-06-05 NOTE — Addendum Note (Signed)
Addended by: Barnie Mort on: 06/05/2019 12:01 PM   Modules accepted: Level of Service

## 2019-06-05 NOTE — Progress Notes (Addendum)
Location:  Occupational psychologist of Service:  SNF (31) Provider:   Cindi Carbon, ANP [place] [phone]   [name], DO  Patient Care Team: [name], DO as PCP - General (Geriatric Medicine) Sanda Klein, MD as PCP - Cardiology (Cardiology)  Extended Emergency Contact Information Primary Emergency Contact: [name] of [place] Phone: [phone] Work Phone: [phone] Mobile Phone: [phone] Relation: Daughter Secondary Emergency Contact: Lilou, Kneip Work Phone: [phone] Mobile Phone: [phone] Relation: Son  Code Status:  DNR Goals of care: Advanced Directive information Advanced Directives 05/30/2019  Does Patient Have a Medical Advance Directive? Yes  Type of Advance Directive Out of facility DNR (pink MOST or yellow form)  Does patient want to make changes to medical advance directive? No - Patient declined  Copy of [place] in Chart? -  Would patient like information on creating a medical advance directive? -  Pre-existing out of facility DNR order (yellow form or pink MOST form) Pink MOST/Yellow Form most recent copy in chart - Physician notified to receive inpatient order     Chief Complaint  Patient presents with  . Acute Visit    coumadin management    HPI:  Pt is a 86 y.o. female seen today for coumadin management. She is on therapy due to a hx of afib. No excessive bleeding.  No complaints from the nursing staff.  CHA2DS2 VASc: 6   Lab Results  Component Value Date   INR 2.3 (A) 06/05/2019   INR 2.2 (A) 05/22/2019   INR 2.2 (A) 05/22/2019   PROTIME 27.5 (A) 06/05/2019   PROTIME 24.7 (A) 05/22/2019   PROTIME 23.3 (A) 05/15/2019   She has a neoplasm of the right forearm that was not treated at her request and has been a long standing issue. The area is growing in size and frequently has drainage, redness, etc. The nurse asked me to see the  wound as there are now a few pustules on the right forearm surrounding the wound that are new. The patient does not have any fever, pain, increased drainage, or feelings of bodyache, malaise, etc. Completed course of keflex on 05/12/19-05/19/19 for a wound infection to this area.   Past Medical History:  Diagnosis Date  . Arterial leg ulcer ([place]) 10/14/2018  . Breast cancer ([place])    left   . Centrilobular emphysema ([place]) 12/23/2017  . Chronic anticoagulation   . Chronic atrial fibrillation ([place])   . Chronic respiratory failure with hypoxia ([place]) 03/26/2018  . CKD (chronic kidney disease), stage III   . Cor pulmonale, chronic ([place]) 06/13/2013   Pulmonary artery pressure 72 06/15/17 with severe tricuspid regurg  . Essential hypertension 06/13/2013  . Hypertension   . PAH (pulmonary artery hypertension) ([place]) 03/25/2017   Pulmonary artery pressure 72 06/15/17 with severe tricuspid regurg  . Pelvic fracture ([place]) 05/2016  . Permanent atrial fibrillation ([place]) 03/26/2018   Past Surgical History:  Procedure Laterality Date  . ABDOMINAL HYSTERECTOMY    . BREAST LUMPECTOMY      Allergies  Allergen Reactions  . Aspirin     Upset stomach  . Neosporin [Bacitracin-Polymyxin B]     Unsure of reaction  . Codeine Nausea Only    Nausea     Outpatient Encounter Medications as of 06/05/2019  Medication Sig  . acetaminophen (TYLENOL) 325 MG tablet Take 650 mg by mouth 2 (two) times daily.  Marland Kitchen allopurinol (ZYLOPRIM) 100 MG tablet  Take 100 mg by mouth at bedtime.   . ALPRAZolam (XANAX) 0.5 MG tablet Take 1 tablet (0.5 mg total) by mouth at bedtime.  . Amino Acids-Protein Hydrolys (FEEDING SUPPLEMENT, PRO-STAT SUGAR FREE 64,) LIQD 1 fl oz by mouth daily  . antiseptic oral rinse (BIOTENE) LIQD 15 mLs by Mouth Rinse route as needed for dry mouth.  . benzonatate (TESSALON) 100 MG capsule Take 100 mg by mouth 3 (three) times daily as needed for cough.  . Calcium Carbonate-Vitamin D3 (CALCIUM 600-D) 600-400  MG-UNIT TABS Take 1 tablet by mouth daily.  . collagenase (SANTYL) ointment Apply 1 application topically every other day.  Marland Kitchen dextromethorphan (DELSYM) 30 MG/5ML liquid Take 5 mLs by mouth every 4 (four) hours as needed for cough.   . diclofenac sodium (VOLTAREN) 1 % GEL Apply 2 g topically 4 (four) times daily. Right shoulder  . Eyelid Cleansers (OCUSOFT EYELID CLEANSING) PADS Apply 1 application topically daily.  . feeding supplement (BOOST HIGH PROTEIN) LIQD Take 1 Container by mouth daily.   Marland Kitchen HYDROcodone-acetaminophen (NORCO) 5-325 MG tablet Take 1 tablet by mouth every 6 (six) hours as needed for severe pain.  Marland Kitchen ipratropium (ATROVENT) 0.02 % nebulizer solution Take 2.5 mLs (0.5 mg total) by nebulization 3 (three) times daily.  Marland Kitchen levalbuterol (XOPENEX) 1.25 MG/0.5ML nebulizer solution Take 1.25 mg by nebulization 3 (three) times daily.  Marland Kitchen levalbuterol (XOPENEX) 1.25 MG/3ML nebulizer solution Take 1.25 mg by nebulization every 6 (six) hours as needed for wheezing or shortness of breath. In addition to three times daily schedule  . loratadine (CLARITIN) 10 MG tablet Take 10 mg by mouth at bedtime.  Lita Mains (ZYLET) 0.5-0.3 % SUSP Place 1 drop into the right eye in the morning, at noon, and at bedtime.   . metoprolol succinate (TOPROL-XL) 25 MG 24 hr tablet Take 12.5 mg by mouth daily.  . OXYGEN Level 3 or 3 mL/minute when out of room every shift 1, 2, and 3  . Polyethyl Glyc-Propyl Glyc PF (SYSTANE PRESERVATIVE FREE) 0.4-0.3 % SOLN Apply 1 drop to eye 3 (three) times daily as needed (dry eyes). (Patient taking differently: Apply 2 drops to eye in the morning and at bedtime. And tid prn)  . polyethylene glycol (MIRALAX / GLYCOLAX) 17 g packet Take 17 g by mouth daily.   Marland Kitchen spironolactone (ALDACTONE) 25 MG tablet Take 12.5 mg by mouth daily.  Marland Kitchen torsemide (DEMADEX) 20 MG tablet Take 40 mg by mouth daily.   Marland Kitchen triamcinolone cream (KENALOG) 0.1 % Apply 1 application topically as needed.  To right arm for itching  . warfarin (COUMADIN) 1 MG tablet Take 1 mg by mouth every other day. M, W, F  . warfarin (COUMADIN) 2 MG tablet Take 2 mg by mouth every other day. Tuesday, Thursday, Saturday, and Sunday  . [DISCONTINUED] valACYclovir (VALTREX) 1000 MG tablet Take 1,000 mg by mouth 3 (three) times daily. HSV kerartitis   No facility-administered encounter medications on file as of 06/05/2019.    Review of Systems  Constitutional: Negative for activity change, appetite change, chills, diaphoresis, fatigue, fever and unexpected weight change.  Skin: Positive for color change and wound.  Hematological: Bruises/bleeds easily.     Immunization History  Administered Date(s) Administered  . Influenza Split 12/07/2014  . Influenza, High Dose Seasonal PF 12/15/2018  . Influenza-Unspecified 11/05/2013, 12/02/2016, 12/16/2017  . Moderna SARS-COVID-2 Vaccination 03/07/2019, 04/04/2019  . Pneumococcal Polysaccharide-23 03/02/2012  . Tdap 03/02/2012  . Zoster 03/03/2011   Pertinent  Health Maintenance  Due  Topic Date Due  . PNA vac Low Risk Adult (2 of 2 - PCV13) 03/02/2013  . INFLUENZA VACCINE  10/01/2019  . DEXA SCAN  Completed   Fall Risk  03/30/2019 01/13/2019  Falls in the past year? 1 -  Number falls in past yr: 1 -  Injury with Fall? 1 -  Risk for fall due to : History of fall(s) History of fall(s);Impaired balance/gait;Impaired mobility;Medication side effect;Mental status change  Follow up Falls evaluation completed Falls evaluation completed;Education provided;Falls prevention discussed  Comment - done at [place] as part of care plan   Functional Status Survey:    There were no vitals filed for this visit. There is no height or weight on file to calculate BMI. Physical Exam Constitutional:      Appearance: Normal appearance.  Cardiovascular:     Comments: +CMS to right forearm Skin:    General: Skin is warm and dry.     Findings: Erythema (Right forearm with  two raised lesions with 100% pink tissue and scattered plaques surrounding the area. Erythema is noted with scattered small pustules surrouding the raised lesion on the right forearm. No swelling or warmth is noted.  ) present.  Neurological:     Mental Status: She is alert.     Labs reviewed: Recent Labs    11/01/18 0600 11/14/18 0000 05/22/19 0600  NA 136* 143 138  K 4.5 4.0 4.4  CL  --   --  93*  CO2  --   --  30*  BUN 61* 49* 52*  CREATININE 1.2* 1.1 1.2*  CALCIUM  --   --  10.8*   No results for input(s): AST, ALT, ALKPHOS, BILITOT, PROT, ALBUMIN in the last 8760 hours. Recent Labs    11/01/18 0000 11/01/18 0600 05/22/19 0600  WBC 5.9 5.9 12.5  HGB 12.8 12.8 14.4  HCT 38 38 43  PLT 167 167 212   Lab Results  Component Value Date   TSH 6.46 (A) 05/22/2019   No results found for: HGBA1C No results found for: CHOL, HDL, LDLCALC, LDLDIRECT, TRIG, CHOLHDL  Significant Diagnostic Results in last 30 days:  No results found.  Assessment/Plan  1. Hypercoagulable state due to atrial fibrillation (HCC) Rate is controlled.  Continues on Toprol for rate control and coumadin for CVA risk reduction   2. Current use of long term anticoagulation Continue Coumadin 27m M, W, F and 2 mg Tues, Thurs, Sat and Sunday  No noted interaction between coumadin and Valtrex that would warrant more frequent checks   3. 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 X 10 days. Report if no improvement.   Family/ staff Communication: resident and staff  Labs/tests ordered:  INR 2 weeks

## 2019-06-06 DIAGNOSIS — B0052 Herpesviral keratitis: Secondary | ICD-10-CM | POA: Diagnosis not present

## 2019-06-19 ENCOUNTER — Encounter: Payer: Self-pay | Admitting: Adult Health

## 2019-06-19 ENCOUNTER — Non-Acute Institutional Stay (SKILLED_NURSING_FACILITY): Payer: Medicare Other | Admitting: Adult Health

## 2019-06-19 DIAGNOSIS — B0052 Herpesviral keratitis: Secondary | ICD-10-CM

## 2019-06-19 DIAGNOSIS — K5909 Other constipation: Secondary | ICD-10-CM | POA: Diagnosis not present

## 2019-06-19 DIAGNOSIS — D6869 Other thrombophilia: Secondary | ICD-10-CM | POA: Diagnosis not present

## 2019-06-19 DIAGNOSIS — N1831 Chronic kidney disease, stage 3a: Secondary | ICD-10-CM

## 2019-06-19 DIAGNOSIS — I4891 Unspecified atrial fibrillation: Secondary | ICD-10-CM

## 2019-06-19 DIAGNOSIS — I2781 Cor pulmonale (chronic): Secondary | ICD-10-CM | POA: Diagnosis not present

## 2019-06-19 DIAGNOSIS — J9611 Chronic respiratory failure with hypoxia: Secondary | ICD-10-CM | POA: Diagnosis not present

## 2019-06-19 DIAGNOSIS — I4821 Permanent atrial fibrillation: Secondary | ICD-10-CM

## 2019-06-19 LAB — POCT INR: INR: 5.3 — AB (ref <=1.1)

## 2019-06-19 LAB — PROTIME-INR: Protime: 63.7 — AB (ref 10.0–13.8)

## 2019-06-19 NOTE — Progress Notes (Signed)
Location:  Occupational psychologist of Service:  SNF (31) Provider:  Cindi Carbon, ANP [place] [phone]   [name], DO  Patient Care Team: [name], DO as PCP - General (Geriatric Medicine) Sanda Klein, MD as PCP - Cardiology (Cardiology)  Extended Emergency Contact Information Primary Emergency Contact: [name] of [place] Phone: [phone] Work Phone: [phone] Mobile Phone: [phone] Relation: Daughter Secondary Emergency Contact: Jessica, Garza Work Phone: [phone] Mobile Phone: [phone] Relation: Son  Code Status:  DNR Goals of care: Advanced Directive information Advanced Directives 05/30/2019  Does Patient Have a Medical Advance Directive? Yes  Type of Advance Directive Out of facility DNR (pink MOST or yellow form)  Does patient want to make changes to medical advance directive? No - Patient declined  Copy of [place] in Chart? -  Would patient like information on creating a medical advance directive? -  Pre-existing out of facility DNR order (yellow form or pink MOST form) Pink MOST/Yellow Form most recent copy in chart - Physician notified to receive inpatient order     Chief Complaint  Patient presents with  . Medical Management of Chronic Issues    HPI:  Pt is a 86 y.o. female seen today for medical management of chronic diseases.    INR 5.3 06/19/2019 after stable numbers for several weeks. No clear interaction, change in diet, and acute illness. Did report one episode of vomiting several days ago but this resolved with no other issues.   She has corpulmonale and COPD. Has chronic cough and doe but no change in chronic symptoms. She reports that she has a small appetite but her weight has trended upward by 6 lbs in the past two months. No edema, doe, pnd, or change in oxygen (continues on three liters chronically)  Afib rate remains control, bp  soft at times  Bowels are moving well.   She is under the care of ophthalmology due to HSV keratitis of the right eye, currently on Zylet and Valtrex. Her eye look much better and are less irritated by her account.   Functional status: ambulatory, intermittently incontinent  Past Medical History:  Diagnosis Date  . Arterial leg ulcer ([place]) 10/14/2018  . Breast cancer ([place])    left   . Centrilobular emphysema ([place]) 12/23/2017  . Chronic anticoagulation   . Chronic atrial fibrillation ([place])   . Chronic respiratory failure with hypoxia ([place]) 03/26/2018  . CKD (chronic kidney disease), stage III   . Cor pulmonale, chronic ([place]) 06/13/2013   Pulmonary artery pressure 72 06/15/17 with severe tricuspid regurg  . Essential hypertension 06/13/2013  . Hypertension   . PAH (pulmonary artery hypertension) ([place]) 03/25/2017   Pulmonary artery pressure 72 06/15/17 with severe tricuspid regurg  . Pelvic fracture ([place]) 05/2016  . Permanent atrial fibrillation ([place]) 03/26/2018   Past Surgical History:  Procedure Laterality Date  . ABDOMINAL HYSTERECTOMY    . BREAST LUMPECTOMY      Allergies  Allergen Reactions  . Aspirin     Upset stomach  . Neosporin [Bacitracin-Polymyxin B]     Unsure of reaction  . Codeine Nausea Only    Nausea     Outpatient Encounter Medications as of 06/19/2019  Medication Sig  . valACYclovir (VALTREX) 1000 MG tablet Take 1,000 mg by mouth 3 (three) times daily.  Marland Kitchen acetaminophen (TYLENOL) 325 MG tablet Take 650 mg by mouth 2 (two) times daily.  Marland Kitchen allopurinol (ZYLOPRIM) 100  MG tablet Take 100 mg by mouth at bedtime.   . ALPRAZolam (XANAX) 0.5 MG tablet Take 1 tablet (0.5 mg total) by mouth at bedtime.  . Amino Acids-Protein Hydrolys (FEEDING SUPPLEMENT, PRO-STAT SUGAR FREE 64,) LIQD 1 fl oz by mouth daily  . antiseptic oral rinse (BIOTENE) LIQD 15 mLs by Mouth Rinse route as needed for dry mouth.  . benzonatate (TESSALON) 100 MG capsule Take 100 mg by mouth 3 (three)  times daily as needed for cough.  . Calcium Carbonate-Vitamin D3 (CALCIUM 600-D) 600-400 MG-UNIT TABS Take 1 tablet by mouth daily.  . collagenase (SANTYL) ointment Apply 1 application topically every other day.  Marland Kitchen dextromethorphan (DELSYM) 30 MG/5ML liquid Take 5 mLs by mouth every 4 (four) hours as needed for cough.   . diclofenac sodium (VOLTAREN) 1 % GEL Apply 2 g topically 4 (four) times daily. Right shoulder  . Eyelid Cleansers (OCUSOFT EYELID CLEANSING) PADS Apply 1 application topically daily.  . feeding supplement (BOOST HIGH PROTEIN) LIQD Take 1 Container by mouth daily.   Marland Kitchen HYDROcodone-acetaminophen (NORCO) 5-325 MG tablet Take 1 tablet by mouth every 6 (six) hours as needed for severe pain.  Marland Kitchen ipratropium (ATROVENT) 0.02 % nebulizer solution Take 2.5 mLs (0.5 mg total) by nebulization 3 (three) times daily.  Marland Kitchen levalbuterol (XOPENEX) 1.25 MG/0.5ML nebulizer solution Take 1.25 mg by nebulization 3 (three) times daily.  Marland Kitchen levalbuterol (XOPENEX) 1.25 MG/3ML nebulizer solution Take 1.25 mg by nebulization every 6 (six) hours as needed for wheezing or shortness of breath. In addition to three times daily schedule  . loratadine (CLARITIN) 10 MG tablet Take 10 mg by mouth at bedtime.  Lita Mains (ZYLET) 0.5-0.3 % SUSP Place 1 drop into the right eye in the morning, at noon, and at bedtime. 3 x a day x 1 week, then 2 x daily x 1 week, then 1 x daily then stop  . metoprolol succinate (TOPROL-XL) 25 MG 24 hr tablet Take 12.5 mg by mouth daily.  . OXYGEN Level 3 or 3 mL/minute when out of room every shift 1, 2, and 3  . Polyethyl Glyc-Propyl Glyc PF (SYSTANE PRESERVATIVE FREE) 0.4-0.3 % SOLN Apply 1 drop to eye 3 (three) times daily as needed (dry eyes). (Patient taking differently: Apply 2 drops to eye in the morning and at bedtime. And tid prn)  . polyethylene glycol (MIRALAX / GLYCOLAX) 17 g packet Take 17 g by mouth daily.   Marland Kitchen spironolactone (ALDACTONE) 25 MG tablet Take 12.5 mg by  mouth daily.  Marland Kitchen torsemide (DEMADEX) 20 MG tablet Take 40 mg by mouth daily.   Marland Kitchen triamcinolone cream (KENALOG) 0.1 % Apply 1 application topically as needed. To right arm for itching  . warfarin (COUMADIN) 1 MG tablet Take 1 mg by mouth every other day. M, W, F  . warfarin (COUMADIN) 2 MG tablet Take 2 mg by mouth every other day. Tuesday, Thursday, Saturday, and Sunday  . [DISCONTINUED] mupirocin ointment (BACTROBAN) 2 % Place 1 application into the nose daily. X 10 days with dressing changes   No facility-administered encounter medications on file as of 06/19/2019.    Review of Systems  Constitutional: Positive for unexpected weight change (gaining). Negative for activity change, appetite change, chills, diaphoresis, fatigue and fever.  HENT: Negative for congestion.   Respiratory: Negative for cough (chronic), shortness of breath (with exertion ) and wheezing.   Cardiovascular: Negative for chest pain, palpitations and leg swelling.  Gastrointestinal: Negative for abdominal distention, abdominal pain, constipation and  diarrhea.  Genitourinary: Negative for difficulty urinating and dysuria.  Musculoskeletal: Positive for arthralgias and gait problem. Negative for back pain, joint swelling and myalgias.  Skin: Positive for wound.  Neurological: Negative for dizziness, tremors, seizures, syncope, facial asymmetry, speech difficulty, weakness, light-headedness, numbness and headaches.  Hematological: Bruises/bleeds easily.  Psychiatric/Behavioral: Negative for agitation, behavioral problems and confusion.    Immunization History  Administered Date(s) Administered  . Influenza Split 12/07/2014  . Influenza, High Dose Seasonal PF 12/15/2018  . Influenza-Unspecified 11/05/2013, 12/02/2016, 12/16/2017  . Moderna SARS-COVID-2 Vaccination 03/07/2019, 04/04/2019  . Pneumococcal Polysaccharide-23 03/02/2012  . Tdap 03/02/2012  . Zoster 03/03/2011   Pertinent  Health Maintenance Due  Topic  Date Due  . PNA vac Low Risk Adult (2 of 2 - PCV13) 03/02/2013  . INFLUENZA VACCINE  10/01/2019  . DEXA SCAN  Completed   Fall Risk  03/30/2019 01/13/2019  Falls in the past year? 1 -  Number falls in past yr: 1 -  Injury with Fall? 1 -  Risk for fall due to : History of fall(s) History of fall(s);Impaired balance/gait;Impaired mobility;Medication side effect;Mental status change  Follow up Falls evaluation completed Falls evaluation completed;Education provided;Falls prevention discussed  Comment - done at [facility] as part of care plan   Functional Status Survey:    Vitals:   06/19/19 1343  Weight: 151 lb 6.4 oz (68.7 kg)   Body mass index is 25.99 kg/m. Physical Exam Vitals and nursing note reviewed.  Constitutional:      General: She is not in acute distress.    Appearance: She is not diaphoretic.  HENT:     Head: Normocephalic and atraumatic.     Nose: Nose normal.     Mouth/Throat:     Mouth: Mucous membranes are moist.     Pharynx: Oropharynx is clear. No oropharyngeal exudate.  Eyes:     General:        Right eye: No discharge.        Left eye: No discharge.     Conjunctiva/sclera: Conjunctivae normal.     Pupils: Pupils are equal, round, and reactive to light.  Neck:     Vascular: No JVD.  Cardiovascular:     Rate and Rhythm: Normal rate. Rhythm irregular.     Heart sounds: No murmur.  Pulmonary:     Effort: Pulmonary effort is normal. No respiratory distress.     Breath sounds: Rales present. No wheezing.  Abdominal:     General: Bowel sounds are normal. There is no distension.     Palpations: Abdomen is soft.     Tenderness: There is no abdominal tenderness.  Musculoskeletal:     Right lower leg: No edema.     Left lower leg: No edema.  Skin:    General: Skin is warm and dry.  Neurological:     Mental Status: She is alert and oriented to person, place, and time.  Psychiatric:        Mood and Affect: Mood normal.     Labs reviewed: Recent  Labs    11/01/18 0600 11/14/18 0000 05/22/19 0600  NA 136* 143 138  K 4.5 4.0 4.4  CL  --   --  93*  CO2  --   --  30*  BUN 61* 49* 52*  CREATININE 1.2* 1.1 1.2*  CALCIUM  --   --  10.8*   No results for input(s): AST, ALT, ALKPHOS, BILITOT, PROT, ALBUMIN in the last 8760 hours. Recent Labs  11/01/18 0000 11/01/18 0600 05/22/19 0600  WBC 5.9 5.9 12.5  HGB 12.8 12.8 14.4  HCT 38 38 43  PLT 167 167 212   Lab Results  Component Value Date   TSH 6.46 (A) 05/22/2019   No results found for: HGBA1C No results found for: CHOL, HDL, LDLCALC, LDLDIRECT, TRIG, CHOLHDL  Significant Diagnostic Results in last 30 days:  No results found.  Assessment/Plan 1. Hypercoagulable state due to atrial fibrillation (HCC) Hold coumadin Unclear reason for elevation (valtrex typically would not cause an interaction) Recheck INR in the am. [name] is resistant to venous blood draws due to excessive bruising. I let her know we would do a finger stick in the am but may need validation with a venous stick and she was in agreemnt  2. Cor pulmonale, chronic (HCC) Continue Torsemide aldactone Continue weight monitoring. Her weight has been rising over time but she does not have any increased edema or respiratory symptoms so likely it is due to intake. (could consider reducing boost)  3. Chronic respiratory failure with hypoxia (HCC) Continue oxygne at 3 liters continuously  4. Chronic constipation Continue miralax 17 grams qd   5. Permanent atrial fibrillation (HCC) Rate is controlled with Toprol. Coumadin on hold as above.   6. Stage 3a chronic kidney disease Continue to periodically monitor BMP and avoid nephrotoxic agents  7. HSV keratitis Improved, continues on Zylet and Valtrex. Due for f/u with ophthalmology next month  8. Subclinical hypothyroidism Lab Results  Component Value Date   TSH 6.46 (A) 05/22/2019    Recheck TSH in Sept   Family/ staff Communication: discussed  with resident and her nurse  Labs/tests ordered:  INR in am

## 2019-06-22 ENCOUNTER — Encounter: Payer: Self-pay | Admitting: Adult Health

## 2019-06-22 ENCOUNTER — Encounter: Payer: Self-pay | Admitting: Internal Medicine

## 2019-06-22 ENCOUNTER — Non-Acute Institutional Stay (SKILLED_NURSING_FACILITY): Payer: Medicare Other | Admitting: Adult Health

## 2019-06-22 DIAGNOSIS — D6869 Other thrombophilia: Secondary | ICD-10-CM | POA: Diagnosis not present

## 2019-06-22 DIAGNOSIS — R319 Hematuria, unspecified: Secondary | ICD-10-CM | POA: Diagnosis not present

## 2019-06-22 DIAGNOSIS — Z7901 Long term (current) use of anticoagulants: Secondary | ICD-10-CM

## 2019-06-22 DIAGNOSIS — I4891 Unspecified atrial fibrillation: Secondary | ICD-10-CM | POA: Diagnosis not present

## 2019-06-22 DIAGNOSIS — D72829 Elevated white blood cell count, unspecified: Secondary | ICD-10-CM

## 2019-06-22 DIAGNOSIS — N189 Chronic kidney disease, unspecified: Secondary | ICD-10-CM | POA: Diagnosis not present

## 2019-06-22 DIAGNOSIS — D649 Anemia, unspecified: Secondary | ICD-10-CM | POA: Diagnosis not present

## 2019-06-22 DIAGNOSIS — Z79899 Other long term (current) drug therapy: Secondary | ICD-10-CM | POA: Diagnosis not present

## 2019-06-22 LAB — COMPREHENSIVE METABOLIC PANEL
Albumin: 3.8 (ref 3.5–5.0)
Calcium: 8.8 (ref 8.7–10.7)

## 2019-06-22 LAB — HEPATIC FUNCTION PANEL
ALT: 8 (ref 7–35)
AST: 18 (ref 13–35)

## 2019-06-22 LAB — CBC AND DIFFERENTIAL
HCT: 47 — AB (ref 36–46)
Hemoglobin: 15.8 (ref 12.0–16.0)
Platelets: 228 (ref 150–399)
WBC: 21.6

## 2019-06-22 LAB — BASIC METABOLIC PANEL
BUN: 40 — AB (ref 4–21)
CO2: 95 — AB (ref 13–22)
Chloride: 25 — AB (ref 99–108)
Creatinine: 1.1 (ref 0.5–1.1)
Glucose: 189
Potassium: 3.9 (ref 3.4–5.3)
Sodium: 138 (ref 137–147)

## 2019-06-22 LAB — CBC: RBC: 4.31 (ref 3.87–5.11)

## 2019-06-22 LAB — POCT INR: INR: 6.4 — AB (ref <=1.1)

## 2019-06-22 LAB — PROTIME-INR: INR: 3 — AB (ref 0.9–1.1)

## 2019-06-22 NOTE — Progress Notes (Signed)
Location:  Occupational psychologist of Service:  SNF (31) Provider:   Cindi Carbon, ANP [place] [phone]   [name], DO  Patient Care Team: [name], DO as PCP - General (Geriatric Medicine) Sanda Klein, MD as PCP - Cardiology (Cardiology)  Extended Emergency Contact Information Primary Emergency Contact: [name] of [place] Phone: [phone] Work Phone: [phone] Mobile Phone: [phone] Relation: Daughter Secondary Emergency Contact: Tanyiah, [place] Work Phone: [phone] Mobile Phone: [phone] Relation: Son  Code Status:  DNR Goals of care: Advanced Directive information Advanced Directives 06/23/2019  Does Patient Have a Medical Advance Directive? Yes  Type of Paramedic of [place];Living will;Out of facility DNR (pink MOST or yellow form)  Does patient want to make changes to medical advance directive? -  Copy of [place] in Chart? Yes - validated most recent copy scanned in chart (See row information)  Would patient like information on creating a medical advance directive? -  Pre-existing out of facility DNR order (yellow form or pink MOST form) Pink MOST/Yellow Form most recent copy in chart - Physician notified to receive inpatient order;Yellow form placed in chart (order not valid for inpatient use)     Chief Complaint  Patient presents with  . Acute Visit    coumadin management     HPI:  Pt is a 86 y.o. female seen today for an acute visit for coumadin management. She is on coumadin for a hx of afib for CVA risk reduction. Coumadin has been on hold due to elevation for the past 4 days, today reads at 6.4 via finger stick. Venous lab drawn for validation and reads 3.0. CBC was drawn as well in f/u from a slight elevation of 12.5 on 3/22 and returned at 21 06/22/2019, Eosinophils 15.8%, Neutrophils 62.9%, ANC 13.6 Pt has not had  issues with bleeding at this time. She does report easy bruising which is not new. She denies any issues with urinary symptoms. Has chronic COPD with cough and mild sputum which is unchanged. No sob, fever, sore throat, n,v, d, etc.  No other complaints. 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 Her right eye has improvement in redness and irritation. She has multiple skin cancers on her chest and right arm that are enlarging. She has chosen not to treat them due to her age and debility.    Past Medical History:  Diagnosis Date  . Arterial leg ulcer ([place]) 10/14/2018  . Breast cancer ([place])    left   . Centrilobular emphysema ([place]) 12/23/2017  . Chronic anticoagulation   . Chronic atrial fibrillation ([place])   . Chronic respiratory failure with hypoxia ([place]) 03/26/2018  . CKD (chronic kidney disease), stage III   . Cor pulmonale, chronic ([place]) 06/13/2013   Pulmonary artery pressure 72 06/15/17 with severe tricuspid regurg  . Essential hypertension 06/13/2013  . Hypertension   . PAH (pulmonary artery hypertension) ([place]) 03/25/2017   Pulmonary artery pressure 72 06/15/17 with severe tricuspid regurg  . Pelvic fracture ([place]) 05/2016  . Permanent atrial fibrillation ([place]) 03/26/2018   Past Surgical History:  Procedure Laterality Date  . ABDOMINAL HYSTERECTOMY    . BREAST LUMPECTOMY      Allergies  Allergen Reactions  . Aspirin     Upset stomach  . Neosporin [Bacitracin-Polymyxin B]     Unsure of reaction  . Codeine Nausea Only  Nausea     Outpatient Encounter Medications as of 06/22/2019  Medication Sig  . acetaminophen (TYLENOL) 325 MG tablet Take 650 mg by mouth 2 (two) times daily.  Marland Kitchen allopurinol (ZYLOPRIM) 100 MG tablet Take 100 mg by mouth at bedtime.   . ALPRAZolam (XANAX) 0.5 MG tablet Take 1 tablet (0.5 mg total) by mouth at bedtime.  . Amino Acids-Protein Hydrolys (FEEDING SUPPLEMENT, PRO-STAT SUGAR FREE 64,) LIQD 1 fl oz by mouth daily  .  antiseptic oral rinse (BIOTENE) LIQD 15 mLs by Mouth Rinse route as needed for dry mouth.  . benzonatate (TESSALON) 100 MG capsule Take 100 mg by mouth 3 (three) times daily as needed for cough.  . Calcium Carbonate-Vitamin D3 (CALCIUM 600-D) 600-400 MG-UNIT TABS Take 1 tablet by mouth daily.  . collagenase (SANTYL) ointment Apply 1 application topically every other day.  Marland Kitchen dextromethorphan (DELSYM) 30 MG/5ML liquid Take 5 mLs by mouth every 4 (four) hours as needed for cough.   . diclofenac sodium (VOLTAREN) 1 % GEL Apply 2 g topically 4 (four) times daily. Right shoulder  . Eyelid Cleansers (OCUSOFT EYELID CLEANSING) PADS Apply 1 application topically daily.  . feeding supplement (BOOST HIGH PROTEIN) LIQD Take 1 Container by mouth daily.   Marland Kitchen HYDROcodone-acetaminophen (NORCO) 5-325 MG tablet Take 1 tablet by mouth every 6 (six) hours as needed for severe pain.  Marland Kitchen ipratropium (ATROVENT) 0.02 % nebulizer solution Take 2.5 mLs (0.5 mg total) by nebulization 3 (three) times daily.  Marland Kitchen levalbuterol (XOPENEX) 1.25 MG/0.5ML nebulizer solution Take 1.25 mg by nebulization 3 (three) times daily.  Marland Kitchen levalbuterol (XOPENEX) 1.25 MG/3ML nebulizer solution Take 1.25 mg by nebulization every 6 (six) hours as needed for wheezing or shortness of breath. In addition to three times daily schedule  . loratadine (CLARITIN) 10 MG tablet Take 10 mg by mouth at bedtime.  Lita Mains (ZYLET) 0.5-0.3 % SUSP Place 1 drop into the right eye in the morning, at noon, and at bedtime. 3 x a day x 1 week, then 2 x daily x 1 week, then 1 x daily then stop  . metoprolol succinate (TOPROL-XL) 25 MG 24 hr tablet Take 12.5 mg by mouth daily.  . OXYGEN Level 3 or 3 mL/minute when out of room every shift 1, 2, and 3  . Polyethyl Glyc-Propyl Glyc PF (SYSTANE PRESERVATIVE FREE) 0.4-0.3 % SOLN Apply 1 drop to eye 3 (three) times daily as needed (dry eyes). (Patient taking differently: Apply 2 drops to eye in the morning and at  bedtime. And tid prn)  . polyethylene glycol (MIRALAX / GLYCOLAX) 17 g packet Take 17 g by mouth daily.   Marland Kitchen spironolactone (ALDACTONE) 25 MG tablet Take 12.5 mg by mouth daily.  Marland Kitchen torsemide (DEMADEX) 20 MG tablet Take 40 mg by mouth daily.   Marland Kitchen triamcinolone cream (KENALOG) 0.1 % Apply 1 application topically as needed. To right arm for itching  . valACYclovir (VALTREX) 1000 MG tablet Take 1,000 mg by mouth 3 (three) times daily.  Marland Kitchen warfarin (COUMADIN) 1 MG tablet Take 1 mg by mouth every other day. M, W, F  . warfarin (COUMADIN) 2 MG tablet Take 2 mg by mouth every other day. Tuesday, Thursday, Saturday, and Sunday   No facility-administered encounter medications on file as of 06/22/2019.    Review of Systems  Constitutional: Positive for fatigue. Negative for activity change, appetite change, chills, diaphoresis, fever and unexpected weight change.  HENT: Negative for congestion.   Respiratory: Positive for cough (chronic).  Negative for shortness of breath (with exertion, chronic) and wheezing.   Cardiovascular: Negative for chest pain, palpitations and leg swelling.  Gastrointestinal: Negative for abdominal distention, abdominal pain, constipation and diarrhea.  Genitourinary: Negative for difficulty urinating and dysuria.  Musculoskeletal: Positive for gait problem. Negative for arthralgias, back pain, joint swelling and myalgias.  Skin: Positive for wound.  Neurological: Negative for dizziness, tremors, seizures, syncope, facial asymmetry, speech difficulty, weakness, light-headedness, numbness and headaches.  Psychiatric/Behavioral: Negative for agitation, behavioral problems and confusion.    Immunization History  Administered Date(s) Administered  . Influenza Split 12/07/2014  . Influenza, High Dose Seasonal PF 12/15/2018  . Influenza-Unspecified 11/05/2013, 12/02/2016, 12/16/2017  . Moderna SARS-COVID-2 Vaccination 03/07/2019, 04/04/2019  . Pneumococcal Polysaccharide-23  03/02/2012  . Tdap 03/02/2012  . Zoster 03/03/2011   Pertinent  Health Maintenance Due  Topic Date Due  . PNA vac Low Risk Adult (2 of 2 - PCV13) 03/02/2013  . INFLUENZA VACCINE  10/01/2019  . DEXA SCAN  Completed   Fall Risk  03/30/2019 01/13/2019  Falls in the past year? 1 -  Number falls in past yr: 1 -  Injury with Fall? 1 -  Risk for fall due to : History of fall(s) History of fall(s);Impaired balance/gait;Impaired mobility;Medication side effect;Mental status change  Follow up Falls evaluation completed Falls evaluation completed;Education provided;Falls prevention discussed  Comment - done at [facility] as part of care plan   Functional Status Survey:    There were no vitals filed for this visit. There is no height or weight on file to calculate BMI. Physical Exam Vitals and nursing note reviewed.  Neurological:     General: No focal deficit present.     Mental Status: She is alert. Mental status is at baseline.  Psychiatric:        Mood and Affect: Mood normal.     Labs reviewed: Recent Labs    11/01/18 0600 11/14/18 0000 05/22/19 0600  NA 136* 143 138  K 4.5 4.0 4.4  CL  --   --  93*  CO2  --   --  30*  BUN 61* 49* 52*  CREATININE 1.2* 1.1 1.2*  CALCIUM  --   --  10.8*   No results for input(s): AST, ALT, ALKPHOS, BILITOT, PROT, ALBUMIN in the last 8760 hours. Recent Labs    11/01/18 0000 11/01/18 0600 05/22/19 0600  WBC 5.9 5.9 12.5  HGB 12.8 12.8 14.4  HCT 38 38 43  PLT 167 167 212   Lab Results  Component Value Date   TSH 6.46 (A) 05/22/2019   No results found for: HGBA1C No results found for: CHOL, HDL, LDLCALC, LDLDIRECT, TRIG, CHOLHDL  Significant Diagnostic Results in last 30 days:  No results found.  Assessment/Plan  1. Hypercoagulable state due to atrial fibrillation (HCC) Continues on Toprol Xl 12.5 mg qd for rate control  2. Current use of long term anticoagulation Coumadin on hold due to elevated ?validity Recheck INR  tomorrow via venous stick. Check INR machine for calibration per [facility]   3. Leukocytosis, unspecified type Unclear etiology, pt is asymptomatic.  Recheck CBC tomorrow with with diff and peripheral smear. Check UA C and S and PCXR as well.  Will isolate and check covid swab   Family/ staff Communication: discussed with the nurse, will f/u with the resident and her daughter tomorrow.   Labs/tests ordered:  CBC INR CXR UA C and S

## 2019-06-23 ENCOUNTER — Non-Acute Institutional Stay (SKILLED_NURSING_FACILITY): Payer: Medicare Other | Admitting: Adult Health

## 2019-06-23 ENCOUNTER — Encounter: Payer: Self-pay | Admitting: Adult Health

## 2019-06-23 DIAGNOSIS — Z7189 Other specified counseling: Secondary | ICD-10-CM

## 2019-06-23 DIAGNOSIS — D72829 Elevated white blood cell count, unspecified: Secondary | ICD-10-CM | POA: Diagnosis not present

## 2019-06-23 DIAGNOSIS — Z7901 Long term (current) use of anticoagulants: Secondary | ICD-10-CM

## 2019-06-23 DIAGNOSIS — I4891 Unspecified atrial fibrillation: Secondary | ICD-10-CM | POA: Diagnosis not present

## 2019-06-23 DIAGNOSIS — D6869 Other thrombophilia: Secondary | ICD-10-CM | POA: Diagnosis not present

## 2019-06-23 DIAGNOSIS — D649 Anemia, unspecified: Secondary | ICD-10-CM | POA: Diagnosis not present

## 2019-06-23 DIAGNOSIS — Z9189 Other specified personal risk factors, not elsewhere classified: Secondary | ICD-10-CM | POA: Diagnosis not present

## 2019-06-23 LAB — POCT INR
INR: 2.3 — AB (ref 0.9–1.1)
INR: 3 — AB (ref 0.9–1.1)

## 2019-06-23 LAB — PROTIME-INR
Protime: 25 — AB (ref 10.0–13.8)
Protime: 31.3 — AB (ref 10.0–13.8)

## 2019-06-23 NOTE — Progress Notes (Signed)
Location:  Occupational psychologist of Service:  SNF (31) Provider:   Cindi Carbon, ANP [place] [phone]   [name], DO  Patient Care Team: [name], DO as PCP - General (Geriatric Medicine) Sanda Klein, MD as PCP - Cardiology (Cardiology)  Extended Emergency Contact Information Primary Emergency Contact: [name] of [place] Phone: [phone] Work Phone: [phone] Mobile Phone: [phone] Relation: Daughter Secondary Emergency Contact: Fayelynn, [place] Work Phone: [phone] Mobile Phone: [phone] Relation: Son  Code Status:  DNR Goals of care: Advanced Directive information Advanced Directives 06/23/2019  Does Patient Have a Medical Advance Directive? Yes  Type of Paramedic of [place];Living will;Out of facility DNR (pink MOST or yellow form)  Does patient want to make changes to medical advance directive? -  Copy of [place] in Chart? Yes - validated most recent copy scanned in chart (See row information)  Would patient like information on creating a medical advance directive? -  Pre-existing out of facility DNR order (yellow form or pink MOST form) Pink MOST/Yellow Form most recent copy in chart - Physician notified to receive inpatient order;Yellow form placed in chart (order not valid for inpatient use)     Chief Complaint  Patient presents with  . Acute Visit    f/u elevated INR and WBC    HPI:  Pt is a 86 y.o. female seen today for an acute visit for f/u regarding elevated INR and WBC.    Afib/coumadin management: coumadin has been on hold due to elevated INRs. We were doing finger sticks but switched to venous draws due to concern for accuracy. INR was 3.0 on 4/22 venous, 6.4 on the finger stick.  06/23/2019 INR was 2.3.  She is not having any bleeding issues. Lab Results  Component Value Date   INR 3.0 (A) 06/23/2019   INR 2.3  (A) 06/23/2019   INR 6.4 (A) 06/22/2019   PROTIME 31.3 (A) 06/23/2019   PROTIME 25.0 (A) 06/23/2019   PROTIME 63.7 (A) 06/19/2019     Leukocytosis: she reports fatigue but no other symptoms of covid or otherwise. She did vomit one time last week but this resolved. There is no fever. CXR was obtained and was negative for acute infection. Urine was obtained and is also negative. Peripheral smear ordered and pending. Covid swab ordered and pt isolated.  06/22/19: WBC 21.4 Eosinophils 15.8%, Neutrophils 62.9%, ANC 13.6 She remains on valtrex for HSV keratitis (not known to interact with coumadin  Has skin cancers that enlarging to her chest and arm that are growing but do not have s/s of infection or drainage.    Past Medical History:  Diagnosis Date  . Arterial leg ulcer ([facility]) 10/14/2018  . Breast cancer ([place] [facility])    left   . Centrilobular emphysema ([place]) 12/23/2017  . Chronic anticoagulation   . Chronic atrial fibrillation ([place])   . Chronic respiratory failure with hypoxia ([place]) 03/26/2018  . CKD (chronic kidney disease), stage III   . Cor pulmonale, chronic ([place]) 06/13/2013   Pulmonary artery pressure 72 06/15/17 with severe tricuspid regurg  . Essential hypertension 06/13/2013  . Hypertension   . PAH (pulmonary artery hypertension) ([place]) 03/25/2017   Pulmonary artery pressure 72 06/15/17 with severe tricuspid regurg  . Pelvic fracture ([facility]) 05/2016  . Permanent atrial fibrillation ([place]) 03/26/2018   Past Surgical History:  Procedure Laterality Date  . ABDOMINAL HYSTERECTOMY    . BREAST LUMPECTOMY  Allergies  Allergen Reactions  . Aspirin     Upset stomach  . Neosporin [Bacitracin-Polymyxin B]     Unsure of reaction  . Codeine Nausea Only    Nausea     Outpatient Encounter Medications as of 06/23/2019  Medication Sig  . acetaminophen (TYLENOL) 325 MG tablet Take 650 mg by mouth 2 (two) times daily.  Marland Kitchen allopurinol (ZYLOPRIM) 100 MG tablet Take 100 mg by mouth at bedtime.   .  ALPRAZolam (XANAX) 0.5 MG tablet Take 1 tablet (0.5 mg total) by mouth at bedtime.  . Amino Acids-Protein Hydrolys (FEEDING SUPPLEMENT, PRO-STAT SUGAR FREE 64,) LIQD 1 fl oz by mouth daily  . antiseptic oral rinse (BIOTENE) LIQD 15 mLs by Mouth Rinse route as needed for dry mouth.  . benzonatate (TESSALON) 100 MG capsule Take 100 mg by mouth 3 (three) times daily as needed for cough.  . Calcium Carbonate-Vitamin D3 (CALCIUM 600-D) 600-400 MG-UNIT TABS Take 1 tablet by mouth daily.  . collagenase (SANTYL) ointment Apply 1 application topically every other day.  Marland Kitchen dextromethorphan (DELSYM) 30 MG/5ML liquid Take 5 mLs by mouth every 4 (four) hours as needed for cough.   . diclofenac sodium (VOLTAREN) 1 % GEL Apply 2 g topically 4 (four) times daily. Right shoulder  . Eyelid Cleansers (OCUSOFT EYELID CLEANSING) PADS Apply 1 application topically daily.  . feeding supplement (BOOST HIGH PROTEIN) LIQD Take 1 Container by mouth daily.   Marland Kitchen HYDROcodone-acetaminophen (NORCO) 5-325 MG tablet Take 1 tablet by mouth every 6 (six) hours as needed for severe pain.  Marland Kitchen ipratropium (ATROVENT) 0.02 % nebulizer solution Take 2.5 mLs (0.5 mg total) by nebulization 3 (three) times daily.  Marland Kitchen levalbuterol (XOPENEX) 1.25 MG/0.5ML nebulizer solution Take 1.25 mg by nebulization 3 (three) times daily.  Marland Kitchen levalbuterol (XOPENEX) 1.25 MG/3ML nebulizer solution Take 1.25 mg by nebulization every 6 (six) hours as needed for wheezing or shortness of breath. In addition to three times daily schedule  . loratadine (CLARITIN) 10 MG tablet Take 10 mg by mouth at bedtime.  Lita Mains (ZYLET) 0.5-0.3 % SUSP Place 1 drop into the right eye in the morning, at noon, and at bedtime. 3 x a day x 1 week, then 2 x daily x 1 week, then 1 x daily then stop  . metoprolol succinate (TOPROL-XL) 25 MG 24 hr tablet Take 12.5 mg by mouth daily.  . OXYGEN Level 3 or 3 mL/minute when out of room every shift 1, 2, and 3  . Polyethyl  Glyc-Propyl Glyc PF (SYSTANE PRESERVATIVE FREE) 0.4-0.3 % SOLN Apply 1 drop to eye 3 (three) times daily as needed (dry eyes). (Patient taking differently: Apply 2 drops to eye in the morning and at bedtime. And tid prn)  . polyethylene glycol (MIRALAX / GLYCOLAX) 17 g packet Take 17 g by mouth daily.   Marland Kitchen spironolactone (ALDACTONE) 25 MG tablet Take 12.5 mg by mouth daily.  Marland Kitchen torsemide (DEMADEX) 20 MG tablet Take 40 mg by mouth daily.   Marland Kitchen triamcinolone cream (KENALOG) 0.1 % Apply 1 application topically as needed. To right arm for itching  . valACYclovir (VALTREX) 1000 MG tablet Take 1,000 mg by mouth 3 (three) times daily.  Marland Kitchen warfarin (COUMADIN) 1 MG tablet Take 1 mg by mouth every other day. M, W, F  . warfarin (COUMADIN) 2 MG tablet Take 2 mg by mouth every other day. Tuesday, Thursday, Saturday, and Sunday   No facility-administered encounter medications on file as of 06/23/2019.  Review of Systems  Constitutional: Positive for fatigue. Negative for activity change, appetite change, chills, diaphoresis, fever and unexpected weight change.  HENT: Negative for congestion.   Respiratory: Positive for cough (chronic and unchanged) and shortness of breath (on exertion chronic). Negative for wheezing.   Cardiovascular: Negative for chest pain, palpitations and leg swelling.  Gastrointestinal: Negative for abdominal distention, abdominal pain, constipation and diarrhea.  Genitourinary: Negative for difficulty urinating and dysuria.  Musculoskeletal: Positive for arthralgias and gait problem. Negative for back pain, joint swelling and myalgias.  Neurological: Negative for dizziness, tremors, seizures, syncope, facial asymmetry, speech difficulty, weakness, light-headedness, numbness and headaches.  Psychiatric/Behavioral: Positive for confusion. Negative for agitation and behavioral problems.    Immunization History  Administered Date(s) Administered  . Influenza Split 12/07/2014  .  Influenza, High Dose Seasonal PF 12/15/2018  . Influenza-Unspecified 11/05/2013, 12/02/2016, 12/16/2017  . Moderna SARS-COVID-2 Vaccination 03/07/2019, 04/04/2019  . Pneumococcal Polysaccharide-23 03/02/2012  . Tdap 03/02/2012  . Zoster 03/03/2011   Pertinent  Health Maintenance Due  Topic Date Due  . PNA vac Low Risk Adult (2 of 2 - PCV13) 03/02/2013  . INFLUENZA VACCINE  10/01/2019  . DEXA SCAN  Completed   Fall Risk  03/30/2019 01/13/2019  Falls in the past year? 1 -  Number falls in past yr: 1 -  Injury with Fall? 1 -  Risk for fall due to : History of fall(s) History of fall(s);Impaired balance/gait;Impaired mobility;Medication side effect;Mental status change  Follow up Falls evaluation completed Falls evaluation completed;Education provided;Falls prevention discussed  Comment - done at [facility] as part of care plan   Functional Status Survey:    Vitals:   06/23/19 1219  BP: 96/68  Pulse: 71  Resp: 20  Temp: 97.8 F (36.6 C)  SpO2: 97%   There is no height or weight on file to calculate BMI. Physical Exam Vitals and nursing note reviewed.  Constitutional:      General: She is not in acute distress.    Appearance: She is not diaphoretic.  HENT:     Head: Normocephalic and atraumatic.  Neck:     Vascular: No JVD.  Cardiovascular:     Rate and Rhythm: Normal rate. Rhythm irregular.     Heart sounds: No murmur.  Pulmonary:     Effort: Pulmonary effort is normal. No respiratory distress.     Breath sounds: No wheezing (mild with long exp phase).     Comments: Decreased bases Abdominal:     General: Bowel sounds are normal.     Palpations: Abdomen is soft.  Musculoskeletal:     Right lower leg: No edema.     Left lower leg: No edema.  Skin:    General: Skin is warm and dry.     Comments: Skin lesions left chest raised 100% pink tissue with surrounding erythema, no drainage, tenderness, or swelling. Right chest skin lesion 100% pink tissue, surrounding  erythema noted. No drainage, warmth, or swelling. Not tender.  Right forearm wound unchanged with 100% pink tissue, large area of dry scaly skin covering the upper and lower forearm with mild erythema. No swelling and minimal drainage.   Neurological:     Mental Status: She is alert and oriented to person, place, and time.     Labs reviewed: Recent Labs    11/01/18 0600 11/14/18 0000 05/22/19 0600  NA 136* 143 138  K 4.5 4.0 4.4  CL  --   --  93*  CO2  --   --  30*  BUN 61* 49* 52*  CREATININE 1.2* 1.1 1.2*  CALCIUM  --   --  10.8*   No results for input(s): AST, ALT, ALKPHOS, BILITOT, PROT, ALBUMIN in the last 8760 hours. Recent Labs    11/01/18 0000 11/01/18 0600 05/22/19 0600  WBC 5.9 5.9 12.5  HGB 12.8 12.8 14.4  HCT 38 38 43  PLT 167 167 212   Lab Results  Component Value Date   TSH 6.46 (A) 05/22/2019   No results found for: HGBA1C No results found for: CHOL, HDL, LDLCALC, LDLDIRECT, TRIG, CHOLHDL  Significant Diagnostic Results in last 30 days:  No results found.  Assessment/Plan 1. Leukocytosis, unspecified type Unclear etiology with no current signs of infection ?unerlying bone marrow disorder vs metastatic SCC vs. Covid infection (swab pending at the time of this note)  2. Hypercoagulable state due to atrial fibrillation (HCC) Coumadin will be resumed  Rate is controlled with Toprol.   3. Current use of long term anticoagulation Resume coumadin 1 mg M W F and 2 mg Tues Thurs Sat Sun Recommend venous draws for now. Staff to assess finger stick machine.   4. Advanced care planning I talked with Ms. Oconnell and her daughter Donavan Burnet. We discussed that she has a new leukocytosis of unclear origin. She does not have a UTI or pneumonia. Her covid swab is pending. The differential includes underlying bone marrow disorder, vs metastatic SCC vs Covid (swab pending).  Ms. Balash expressed that she does not want any aggressive work up regarding this issue. She has   DNR and a most form that indicates no hospitalizations with antibiotics to be determined. She has significant skin cancers that are growing to her right arm and chest that she has chosen not to pursue treatment. We will continue to monitor her labs here at [facility] and provide care to her that honors her wishes for comfort care and treatment for only easily reversible issues in the facility.  I spent 20 minutes in counseling and coordination with the resident and family.    Family/ staff Communication: resident and her daughter Denny Peon ordered:  INR Tuesday 4/27

## 2019-06-23 NOTE — ACP (Advance Care Planning) (Signed)
I talked with [name] and her daughter [name]. We discussed that she has a new leukocytosis of unclear origin. She does not have a UTI or pneumonia. Her covid swab is pending. The differential includes underlying bone marrow disorder, vs metastatic SCC vs Covid (swab pending).  [name] expressed that she does not want any aggressive work up regarding this issue. She has  DNR and a most form that indicates no hospitalizations with antibiotics to be determined. She has significant skin cancers that are growing to her right arm and chest that she has chosen not to pursue treatment. We will continue to monitor her labs here at [facility] and provide care to her that honors her wishes for comfort care and treatment for only easily reversible issues in the facility.

## 2019-06-26 DIAGNOSIS — Z20828 Contact with and (suspected) exposure to other viral communicable diseases: Secondary | ICD-10-CM | POA: Diagnosis not present

## 2019-06-27 DIAGNOSIS — D72829 Elevated white blood cell count, unspecified: Secondary | ICD-10-CM | POA: Diagnosis not present

## 2019-06-27 DIAGNOSIS — I4891 Unspecified atrial fibrillation: Secondary | ICD-10-CM | POA: Diagnosis not present

## 2019-07-03 ENCOUNTER — Encounter: Payer: Self-pay | Admitting: Internal Medicine

## 2019-07-06 DIAGNOSIS — H02055 Trichiasis without entropian left lower eyelid: Secondary | ICD-10-CM | POA: Diagnosis not present

## 2019-07-06 DIAGNOSIS — B0052 Herpesviral keratitis: Secondary | ICD-10-CM | POA: Diagnosis not present

## 2019-07-06 DIAGNOSIS — I4891 Unspecified atrial fibrillation: Secondary | ICD-10-CM | POA: Diagnosis not present

## 2019-07-13 ENCOUNTER — Non-Acute Institutional Stay (SKILLED_NURSING_FACILITY): Payer: Medicare Other | Admitting: Adult Health

## 2019-07-13 ENCOUNTER — Encounter: Payer: Self-pay | Admitting: Adult Health

## 2019-07-13 DIAGNOSIS — D6869 Other thrombophilia: Secondary | ICD-10-CM

## 2019-07-13 DIAGNOSIS — D649 Anemia, unspecified: Secondary | ICD-10-CM | POA: Diagnosis not present

## 2019-07-13 DIAGNOSIS — D72829 Elevated white blood cell count, unspecified: Secondary | ICD-10-CM | POA: Diagnosis not present

## 2019-07-13 DIAGNOSIS — Z7901 Long term (current) use of anticoagulants: Secondary | ICD-10-CM | POA: Diagnosis not present

## 2019-07-13 DIAGNOSIS — I4891 Unspecified atrial fibrillation: Secondary | ICD-10-CM | POA: Diagnosis not present

## 2019-07-13 LAB — CBC AND DIFFERENTIAL
HCT: 45 (ref 36–46)
Hemoglobin: 14.7 (ref 12.0–16.0)
Neutrophils Absolute: 11
Platelets: 256 (ref 150–399)
WBC: 16.4

## 2019-07-13 LAB — CBC: RBC: 3.97 (ref 3.87–5.11)

## 2019-07-13 LAB — POCT INR: INR: 1.6 — AB (ref <=1.1)

## 2019-07-13 LAB — PROTIME-INR
Protime: 18.8 — AB (ref 10.0–13.8)
Protime: 27.4 — AB (ref 10.0–13.8)

## 2019-07-13 NOTE — Progress Notes (Signed)
Location:  Occupational psychologist of Service:  SNF (31) Provider:   Cindi Carbon, ANP [place] [phone]   [name], DO  Patient Care Team: [name], DO as PCP - General (Geriatric Medicine) Sanda Klein, MD as PCP - Cardiology (Cardiology)  Extended Emergency Contact Information Primary Emergency Contact: [name] of [place] Phone: [phone] Work Phone: [phone] Mobile Phone: [phone] Relation: Daughter Secondary Emergency Contact: Iretha, [place] Work Phone: [phone] Mobile Phone: [phone] Relation: Son  Code Status:  DNR Goals of care: Advanced Directive information Advanced Directives 06/23/2019  Does Patient Have a Medical Advance Directive? Yes  Type of Paramedic of [place];Living will;Out of facility DNR (pink MOST or yellow form)  Does patient want to make changes to medical advance directive? -  Copy of [place] in Chart? Yes - validated most recent copy scanned in chart (See row information)  Would patient like information on creating a medical advance directive? -  Pre-existing out of facility DNR order (yellow form or pink MOST form) Pink MOST/Yellow Form most recent copy in chart - Physician notified to receive inpatient order;Yellow form placed in chart (order not valid for inpatient use)     Chief Complaint  Patient presents with  . Acute Visit    coumadin management    HPI:  Pt is a 86 y.o. female seen today for an acute visit for coumadin management. She is on coumadin for a hx of afib for CVA risk reduction. There are no reports of excessive bleeding or bruising. INR decreased to 1.6 07/13/2019   Lab Results  Component Value Date   INR 1.6 (A) 07/13/2019   INR 3.0 (A) 06/23/2019   INR 2.3 (A) 06/23/2019   PROTIME 18.8 (A) 07/13/2019   PROTIME 31.3 (A) 06/23/2019   PROTIME 25.0 (A) 06/23/2019      Past  Medical History:  Diagnosis Date  . Arterial leg ulcer ([place]) 10/14/2018  . Breast cancer ([place])    left   . Centrilobular emphysema ([place]) 12/23/2017  . Chronic anticoagulation   . Chronic atrial fibrillation ([place])   . Chronic respiratory failure with hypoxia ([place]) 03/26/2018  . CKD (chronic kidney disease), stage III   . Cor pulmonale, chronic ([place]) 06/13/2013   Pulmonary artery pressure 72 06/15/17 with severe tricuspid regurg  . Essential hypertension 06/13/2013  . Hypertension   . PAH (pulmonary artery hypertension) ([place]) 03/25/2017   Pulmonary artery pressure 72 06/15/17 with severe tricuspid regurg  . Pelvic fracture ([place]) 05/2016  . Permanent atrial fibrillation ([place]) 03/26/2018   Past Surgical History:  Procedure Laterality Date  . ABDOMINAL HYSTERECTOMY    . BREAST LUMPECTOMY      Allergies  Allergen Reactions  . Aspirin     Upset stomach  . Neosporin [Bacitracin-Polymyxin B]     Unsure of reaction  . Codeine Nausea Only    Nausea     Outpatient Encounter Medications as of 07/13/2019  Medication Sig  . allopurinol (ZYLOPRIM) 100 MG tablet Take 100 mg by mouth at bedtime.   . ALPRAZolam (XANAX) 0.5 MG tablet Take 1 tablet (0.5 mg total) by mouth at bedtime.  Marland Kitchen antiseptic oral rinse (BIOTENE) LIQD 15 mLs by Mouth Rinse route as needed for dry mouth.  . benzonatate (TESSALON) 100 MG capsule Take 100 mg by mouth 3 (three) times daily as needed for cough.  . Calcium Carbonate-Vitamin D3 (CALCIUM 600-D) 600-400 MG-UNIT TABS Take  1 tablet by mouth daily.  . collagenase (SANTYL) ointment Apply 1 application topically every other day.  Marland Kitchen dextromethorphan (DELSYM) 30 MG/5ML liquid Take 5 mLs by mouth every 4 (four) hours as needed for cough.   . diclofenac sodium (VOLTAREN) 1 % GEL Apply 2 g topically 4 (four) times daily. Right shoulder  . Eyelid Cleansers (OCUSOFT EYELID CLEANSING) PADS Apply 1 application topically daily.  . feeding supplement (BOOST HIGH PROTEIN) LIQD Take 1  Container by mouth daily.   Marland Kitchen HYDROcodone-acetaminophen (NORCO) 5-325 MG tablet Take 1 tablet by mouth every 6 (six) hours as needed for severe pain.  Marland Kitchen ipratropium (ATROVENT) 0.02 % nebulizer solution Take 2.5 mLs (0.5 mg total) by nebulization 3 (three) times daily.  Marland Kitchen levalbuterol (XOPENEX) 1.25 MG/0.5ML nebulizer solution Take 1.25 mg by nebulization 3 (three) times daily.  Marland Kitchen levalbuterol (XOPENEX) 1.25 MG/3ML nebulizer solution Take 1.25 mg by nebulization every 6 (six) hours as needed for wheezing or shortness of breath. In addition to three times daily schedule  . loratadine (CLARITIN) 10 MG tablet Take 10 mg by mouth at bedtime.  . metoprolol succinate (TOPROL-XL) 25 MG 24 hr tablet Take 12.5 mg by mouth daily.  . OXYGEN Level 3 or 3 mL/minute when out of room every shift 1, 2, and 3  . Polyethyl Glyc-Propyl Glyc PF (SYSTANE PRESERVATIVE FREE) 0.4-0.3 % SOLN Apply 1 drop to eye 3 (three) times daily as needed (dry eyes). (Patient taking differently: Apply 2 drops to eye in the morning and at bedtime. And tid prn)  . acetaminophen (TYLENOL) 325 MG tablet Take 650 mg by mouth 2 (two) times daily.  . polyethylene glycol (MIRALAX / GLYCOLAX) 17 g packet Take 17 g by mouth daily.   Marland Kitchen spironolactone (ALDACTONE) 25 MG tablet Take 12.5 mg by mouth daily.  Marland Kitchen torsemide (DEMADEX) 20 MG tablet Take 40 mg by mouth daily.   Marland Kitchen triamcinolone cream (KENALOG) 0.1 % Apply 1 application topically as needed. To right arm for itching  . valACYclovir (VALTREX) 1000 MG tablet Take 1,000 mg by mouth daily.   Marland Kitchen warfarin (COUMADIN) 1 MG tablet Take 1 mg by mouth every other day. M, W, F, Sun  . warfarin (COUMADIN) 2 MG tablet Take 2 mg by mouth 3 (three) times a week. Tuesday, Thursday, Sat  . [DISCONTINUED] Amino Acids-Protein Hydrolys (FEEDING SUPPLEMENT, PRO-STAT SUGAR FREE 64,) LIQD 1 fl oz by mouth daily  . [DISCONTINUED] Loteprednol-Tobramycin (ZYLET) 0.5-0.3 % SUSP Place 1 drop into the right eye in the  morning, at noon, and at bedtime. 3 x a day x 1 week, then 2 x daily x 1 week, then 1 x daily then stop   No facility-administered encounter medications on file as of 07/13/2019.    Review of Systems  Constitutional: Negative for activity change, appetite change, chills, diaphoresis, fatigue, fever and unexpected weight change.  Hematological: Negative for adenopathy. Does not bruise/bleed easily.    Immunization History  Administered Date(s) Administered  . Influenza Split 12/07/2014  . Influenza, High Dose Seasonal PF 12/15/2018  . Influenza-Unspecified 11/05/2013, 12/02/2016, 12/16/2017  . Moderna SARS-COVID-2 Vaccination 03/07/2019, 04/04/2019  . Pneumococcal Polysaccharide-23 03/02/2012  . Tdap 03/02/2012  . Zoster 03/03/2011   Pertinent  Health Maintenance Due  Topic Date Due  . PNA vac Low Risk Adult (2 of 2 - PCV13) 03/02/2013  . INFLUENZA VACCINE  10/01/2019  . DEXA SCAN  Completed   Fall Risk  03/30/2019 01/13/2019  Falls in the past year? 1 -  Number falls in past yr: 1 -  Injury with Fall? 1 -  Risk for fall due to : History of fall(s) History of fall(s);Impaired balance/gait;Impaired mobility;Medication side effect;Mental status change  Follow up Falls evaluation completed Falls evaluation completed;Education provided;Falls prevention discussed  Comment - done at [facility] as part of care plan   Functional Status Survey:    There were no vitals filed for this visit. There is no height or weight on file to calculate BMI. Physical Exam Vitals and nursing note reviewed.  Neurological:     General: No focal deficit present.     Mental Status: She is alert. Mental status is at baseline.  Psychiatric:        Mood and Affect: Mood normal.     Labs reviewed: Recent Labs    11/14/18 0000 05/22/19 0000 05/22/19 0600 06/22/19 1250  NA 143 138  --  138  K 4.0 4.4  --  3.9  CL  --  93*  --  25*  CO2  --  30*  --  95*  BUN 49* 52*  --  40*  CREATININE 1.1  1.2*  --  1.1  CALCIUM  --   --  10.8* 8.8   Recent Labs    06/22/19 1250  AST 18  ALT 8  ALBUMIN 3.8   Recent Labs    11/01/18 0600 05/22/19 0600 06/22/19 1250  WBC 5.9 12.5 21.6  HGB 12.8 14.4 15.8  HCT 38 43 47*  PLT 167 212 228   Lab Results  Component Value Date   TSH 6.46 (A) 05/22/2019   No results found for: HGBA1C No results found for: CHOL, HDL, LDLCALC, LDLDIRECT, TRIG, CHOLHDL  Significant Diagnostic Results in last 30 days:  No results found.  Assessment/Plan  1. Hypercoagulable state due to atrial fibrillation (HCC) Continues on Toprol Xl 12.5 mg qd for rate control  2. Current use of long term anticoagulation Not therapeutic Increase Coumadin 1 mg M, W, F, Sun 77m Tues, Thurs, Sat  Recheck INR in week  Family/ staff Communication: discussed with the nurse, will f/u with the resident and her daughter tomorrow.   Labs/tests ordered: 1 week INR

## 2019-07-14 ENCOUNTER — Other Ambulatory Visit: Payer: Self-pay | Admitting: Adult Health

## 2019-07-14 DIAGNOSIS — L97909 Non-pressure chronic ulcer of unspecified part of unspecified lower leg with unspecified severity: Secondary | ICD-10-CM

## 2019-07-14 MED ORDER — HYDROCODONE-ACETAMINOPHEN 5-325 MG PO TABS: 1.0000 | ORAL_TABLET | Freq: Four times a day (QID) | ORAL | 0 refills | Status: DC | PRN

## 2019-07-20 ENCOUNTER — Non-Acute Institutional Stay (SKILLED_NURSING_FACILITY): Payer: Medicare Other | Admitting: Adult Health

## 2019-07-20 ENCOUNTER — Encounter: Payer: Self-pay | Admitting: Adult Health

## 2019-07-20 DIAGNOSIS — D6869 Other thrombophilia: Secondary | ICD-10-CM | POA: Diagnosis not present

## 2019-07-20 DIAGNOSIS — I4891 Unspecified atrial fibrillation: Secondary | ICD-10-CM | POA: Diagnosis not present

## 2019-07-20 DIAGNOSIS — Z7901 Long term (current) use of anticoagulants: Secondary | ICD-10-CM | POA: Diagnosis not present

## 2019-07-20 LAB — POCT INR: INR: 3 — AB (ref 0.9–1.1)

## 2019-07-20 LAB — PROTIME-INR: Protime: 31.2 — AB (ref 10.0–13.8)

## 2019-07-20 NOTE — Progress Notes (Signed)
Location:  Occupational psychologist of Service:  SNF (31) Provider:   Cindi Carbon, ANP [place] [phone]   [name], DO  Patient Care Team: [name], DO as PCP - General (Geriatric Medicine) Sanda Klein, MD as PCP - Cardiology (Cardiology)  Extended Emergency Contact Information Primary Emergency Contact: [name] of [place] Phone: [phone] Work Phone: [phone] Mobile Phone: [phone] Relation: Daughter Secondary Emergency Contact: Danyle, [place] Work Phone: [phone] Mobile Phone: [phone] Relation: Son  Code Status:  DNR Goals of care: Advanced Directive information Advanced Directives 06/23/2019  Does Patient Have a Medical Advance Directive? Yes  Type of Paramedic of [place];Living will;Out of facility DNR (pink MOST or yellow form)  Does patient want to make changes to medical advance directive? -  Copy of [place] in Chart? Yes - validated most recent copy scanned in chart (See row information)  Would patient like information on creating a medical advance directive? -  Pre-existing out of facility DNR order (yellow form or pink MOST form) Pink MOST/Yellow Form most recent copy in chart - Physician notified to receive inpatient order;Yellow form placed in chart (order not valid for inpatient use)     Chief Complaint  Patient presents with  . Acute Visit    coumadin management     HPI:  Pt is a 86 y.o. female seen today for an acute visit for coumadin management. She is on coumadin for a hx of afib for CVA risk reduction. There are no reports of excessive bleeding or bruising.  Lab Results  Component Value Date   INR 3.0 (A) 07/20/2019   INR 1.6 (A) 07/13/2019   INR 3.0 (A) 06/23/2019   INR 2.3 (A) 06/23/2019   PROTIME 31.2 (A) 07/20/2019   PROTIME 18.8 (A) 07/13/2019   PROTIME 31.3 (A) 06/23/2019   PROTIME 25.0 (A)  06/23/2019      Past Medical History:  Diagnosis Date  . Arterial leg ulcer ([place]) 10/14/2018  . Breast cancer ([place])    left   . Centrilobular emphysema ([place]) 12/23/2017  . Chronic anticoagulation   . Chronic atrial fibrillation ([place])   . Chronic respiratory failure with hypoxia ([place]) 03/26/2018  . CKD (chronic kidney disease), stage III   . Cor pulmonale, chronic ([employer]) 06/13/2013   Pulmonary artery pressure 72 06/15/17 with severe tricuspid regurg  . Essential hypertension 06/13/2013  . Hypertension   . PAH (pulmonary artery hypertension) ([place]) 03/25/2017   Pulmonary artery pressure 72 06/15/17 with severe tricuspid regurg  . Pelvic fracture ([place]) 05/2016  . Permanent atrial fibrillation ([place]) 03/26/2018   Past Surgical History:  Procedure Laterality Date  . ABDOMINAL HYSTERECTOMY    . BREAST LUMPECTOMY      Allergies  Allergen Reactions  . Aspirin     Upset stomach  . Neosporin [Bacitracin-Polymyxin B]     Unsure of reaction  . Codeine Nausea Only    Nausea     Outpatient Encounter Medications as of 07/20/2019  Medication Sig  . acetaminophen (TYLENOL) 325 MG tablet Take 650 mg by mouth 2 (two) times daily.  Marland Kitchen allopurinol (ZYLOPRIM) 100 MG tablet Take 100 mg by mouth at bedtime.   . ALPRAZolam (XANAX) 0.5 MG tablet Take 1 tablet (0.5 mg total) by mouth at bedtime.  Marland Kitchen antiseptic oral rinse (BIOTENE) LIQD 15 mLs by Mouth Rinse route as needed for dry mouth.  . benzonatate (TESSALON) 100 MG capsule Take  100 mg by mouth 3 (three) times daily as needed for cough.  . Calcium Carbonate-Vitamin D3 (CALCIUM 600-D) 600-400 MG-UNIT TABS Take 1 tablet by mouth daily.  . collagenase (SANTYL) ointment Apply 1 application topically every other day.  Marland Kitchen dextromethorphan (DELSYM) 30 MG/5ML liquid Take 5 mLs by mouth every 4 (four) hours as needed for cough.   . diclofenac sodium (VOLTAREN) 1 % GEL Apply 2 g topically 4 (four) times daily. Right shoulder  . Eyelid Cleansers (OCUSOFT EYELID  CLEANSING) PADS Apply 1 application topically daily.  . feeding supplement (BOOST HIGH PROTEIN) LIQD Take 1 Container by mouth daily.   Marland Kitchen HYDROcodone-acetaminophen (NORCO) 5-325 MG tablet Take 1 tablet by mouth every 6 (six) hours as needed for severe pain.  Marland Kitchen ipratropium (ATROVENT) 0.02 % nebulizer solution Take 2.5 mLs (0.5 mg total) by nebulization 3 (three) times daily.  Marland Kitchen levalbuterol (XOPENEX) 1.25 MG/0.5ML nebulizer solution Take 1.25 mg by nebulization 3 (three) times daily.  Marland Kitchen levalbuterol (XOPENEX) 1.25 MG/3ML nebulizer solution Take 1.25 mg by nebulization every 6 (six) hours as needed for wheezing or shortness of breath. In addition to three times daily schedule  . loratadine (CLARITIN) 10 MG tablet Take 10 mg by mouth at bedtime.  . metoprolol succinate (TOPROL-XL) 25 MG 24 hr tablet Take 12.5 mg by mouth daily.  . OXYGEN Level 3 or 3 mL/minute when out of room every shift 1, 2, and 3  . Polyethyl Glyc-Propyl Glyc PF (SYSTANE PRESERVATIVE FREE) 0.4-0.3 % SOLN Apply 1 drop to eye 3 (three) times daily as needed (dry eyes). (Patient taking differently: Apply 2 drops to eye in the morning and at bedtime. And tid prn)  . polyethylene glycol (MIRALAX / GLYCOLAX) 17 g packet Take 17 g by mouth daily.   Marland Kitchen spironolactone (ALDACTONE) 25 MG tablet Take 12.5 mg by mouth daily.  Marland Kitchen torsemide (DEMADEX) 20 MG tablet Take 40 mg by mouth daily.   Marland Kitchen triamcinolone cream (KENALOG) 0.1 % Apply 1 application topically as needed. To right arm for itching  . valACYclovir (VALTREX) 1000 MG tablet Take 1,000 mg by mouth daily.   Marland Kitchen warfarin (COUMADIN) 1 MG tablet Take 1 mg by mouth every other day. M, W, F, Sun  . warfarin (COUMADIN) 2 MG tablet Take 2 mg by mouth 3 (three) times a week. Tuesday, Thursday, Sat   No facility-administered encounter medications on file as of 07/20/2019.    Review of Systems  Constitutional: Negative for activity change, appetite change, chills, diaphoresis, fatigue, fever and  unexpected weight change.  Hematological: Negative for adenopathy. Does not bruise/bleed easily.    Immunization History  Administered Date(s) Administered  . Influenza Split 12/07/2014  . Influenza, High Dose Seasonal PF 12/15/2018  . Influenza-Unspecified 11/05/2013, 12/02/2016, 12/16/2017  . Moderna SARS-COVID-2 Vaccination 03/07/2019, 04/04/2019  . Pneumococcal Polysaccharide-23 03/02/2012  . Tdap 03/02/2012  . Zoster 03/03/2011   Pertinent  Health Maintenance Due  Topic Date Due  . PNA vac Low Risk Adult (2 of 2 - PCV13) 03/02/2013  . INFLUENZA VACCINE  10/01/2019  . DEXA SCAN  Completed   Fall Risk  03/30/2019 01/13/2019  Falls in the past year? 1 -  Number falls in past yr: 1 -  Injury with Fall? 1 -  Risk for fall due to : History of fall(s) History of fall(s);Impaired balance/gait;Impaired mobility;Medication side effect;Mental status change  Follow up Falls evaluation completed Falls evaluation completed;Education provided;Falls prevention discussed  Comment - done at [facility] as part of care  plan   Functional Status Survey:    There were no vitals filed for this visit. There is no height or weight on file to calculate BMI. Physical Exam Vitals and nursing note reviewed.  Neurological:     General: No focal deficit present.     Mental Status: She is alert. Mental status is at baseline.  Psychiatric:        Mood and Affect: Mood normal.     Labs reviewed: Recent Labs    11/14/18 0000 05/22/19 0000 05/22/19 0600 06/22/19 1250  NA 143 138  --  138  K 4.0 4.4  --  3.9  CL  --  93*  --  25*  CO2  --  30*  --  95*  BUN 49* 52*  --  40*  CREATININE 1.1 1.2*  --  1.1  CALCIUM  --   --  10.8* 8.8   Recent Labs    06/22/19 1250  AST 18  ALT 8  ALBUMIN 3.8   Recent Labs    11/01/18 0600 05/22/19 0600 06/22/19 1250  WBC 5.9 12.5 21.6  HGB 12.8 14.4 15.8  HCT 38 43 47*  PLT 167 212 228   Lab Results  Component Value Date   TSH 6.46 (A)  05/22/2019   No results found for: HGBA1C No results found for: CHOL, HDL, LDLCALC, LDLDIRECT, TRIG, CHOLHDL  Significant Diagnostic Results in last 30 days:  No results found.  Assessment/Plan  1. Hypercoagulable state due to atrial fibrillation (HCC) Continues on Toprol Xl 12.5 mg qd for rate control  2. Current use of long term anticoagulation INR fluctuating, no cause. Pt is doing well. Therapeutic, recheck in 2 weeks Continue current dose   Family/ staff Communication: discussed with the nurse   Labs/tests ordered: INR two weeks

## 2019-07-25 ENCOUNTER — Encounter: Payer: Self-pay | Admitting: Internal Medicine

## 2019-07-25 ENCOUNTER — Non-Acute Institutional Stay (SKILLED_NURSING_FACILITY): Payer: Medicare Other | Admitting: Internal Medicine

## 2019-07-25 DIAGNOSIS — D6869 Other thrombophilia: Secondary | ICD-10-CM | POA: Diagnosis not present

## 2019-07-25 DIAGNOSIS — I4821 Permanent atrial fibrillation: Secondary | ICD-10-CM | POA: Diagnosis not present

## 2019-07-25 DIAGNOSIS — L97909 Non-pressure chronic ulcer of unspecified part of unspecified lower leg with unspecified severity: Secondary | ICD-10-CM

## 2019-07-25 DIAGNOSIS — K5909 Other constipation: Secondary | ICD-10-CM

## 2019-07-25 DIAGNOSIS — J9611 Chronic respiratory failure with hypoxia: Secondary | ICD-10-CM | POA: Diagnosis not present

## 2019-07-25 DIAGNOSIS — C761 Malignant neoplasm of thorax: Secondary | ICD-10-CM

## 2019-07-25 DIAGNOSIS — D0461 Carcinoma in situ of skin of right upper limb, including shoulder: Secondary | ICD-10-CM

## 2019-07-25 DIAGNOSIS — I4891 Unspecified atrial fibrillation: Secondary | ICD-10-CM

## 2019-07-25 NOTE — Progress Notes (Signed)
Patient ID: [name], female   DOB: 1924-05-12, 86 y.o.   MRN: [id number]  Location:  [place] Room Number: 137 Place of Service:  SNF ([phone]) Provider:   [name], DO  Patient Care Team: [name], DO as PCP - General (Geriatric Medicine) Sanda Klein, MD as PCP - Cardiology (Cardiology)  Extended Emergency Contact Information Primary Emergency Contact: [name] of [place] Phone: [phone] Work Phone: [phone] Mobile Phone: [phone] Relation: Daughter Secondary Emergency Contact: Lesleigh Noe Work Phone: [phone] Mobile Phone: [phone] Relation: Son  Code Status:  DNR Goals of care: Advanced Directive information Advanced Directives 07/25/2019  Does Patient Have a Medical Advance Directive? Yes  Type of Advance Directive -  Does patient want to make changes to medical advance directive? No - Patient declined  Copy of [place] in Chart? Yes - validated most recent copy scanned in chart (See row information)  Would patient like information on creating a medical advance directive? -  Pre-existing out of facility DNR order (yellow form or pink MOST form) -     Chief Complaint  Patient presents with  . Medical Management of Chronic Issues    Routine visit     HPI:  Pt is a 86 y.o. female seen today for medical management of chronic diseases.    [name] reports doing fairly well.  All of her skin cancer locations where she has chronic wounds are gradually improving as reported by wound care nursing--right forearm, right leg, right upper chest wall.  They continue to be painful, especially the right arm, and she notes that she cannot put weight on it due to pain which makes walking challenging (getting up and putting weight on walker).  She had been walking more with therapy after covid isolation was discontinued but she's doing less again b/c of her arm primarily.    Breathing is doing well. She had just had a nebulizer when I entered.  No recent infection.    Right leg wound is getting santyl, foam and kerlix wrap changed qod.  BP runs low, but denies dizziness.  Constipation bothersome at times, but not consistently.  Uses daily miralax,   No recent challenges with volume overload.  On toprol 12.54m daily, aldactone, torsemide.  For her afib, she is on coumadin longstanding with INR goal 2-3.  Last was subtherapeutic at 1.6 and adjusted accordingly.  She, unfortunately, has required abx often for her wounds, respiratory infections making warfarin mgt more challenging.  Past Medical History:  Diagnosis Date  . Arterial leg ulcer ([facility] 10/14/2018  . Breast cancer ([facility]    left   . Centrilobular [place]ysema ([facility] 12/23/2017  . Chronic anticoagulation   . Chronic atrial fibrillation ([facility]   . Chronic respiratory failure with hypoxia ([facility] 03/26/2018  . CKD (chronic kidney disease), stage III   . Cor pulmonale, chronic ([facility] 06/13/2013   Pulmonary artery pressure 72 06/15/17 with severe tricuspid regurg  . Essential hypertension 06/13/2013  . Hypertension   . PAH (pulmonary artery hypertension) ([facility] 03/25/2017   Pulmonary artery pressure 72 06/15/17 with severe tricuspid regurg  . [place]vic [place] ([facility] 05/2016  . Permanent atrial fibrillation ([facility] 03/26/2018   Past Surgical History:  Procedure Laterality Date  . ABDOMINAL HYSTERECTOMY    . BREAST LUMPECTOMY      Allergies  Allergen Reactions  . Aspirin     Upset stomach  . Neosporin [Bacitracin-Polymyxin B]     Unsure of  reaction  . Codeine Nausea Only    Nausea     Outpatient Encounter Medications as of 07/25/2019  Medication Sig  . acetaminophen (TYLENOL) 325 MG tablet Take 650 mg by mouth 2 (two) times daily.  Marland Kitchen allopurinol (ZYLOPRIM) 100 MG tablet Take 100 mg by mouth at bedtime.   . ALPRAZolam (XANAX) 0.5 MG tablet Take 1 tablet (0.5 mg total) by mouth at bedtime.  Marland Kitchen antiseptic  oral rinse (BIOTENE) LIQD 15 mLs by Mouth Rinse route as needed for dry mouth.  . benzonatate (TESSALON) 100 MG capsule Take 100 mg by mouth 3 (three) times daily as needed for cough.  . Calcium Carbonate-Vitamin D3 (CALCIUM 600-D) 600-400 MG-UNIT TABS Take 1 tablet by mouth daily.  . collagenase (SANTYL) ointment Apply 1 application topically every other day.  Marland Kitchen dextromethorphan (DELSYM) 30 MG/5ML liquid Take 5 mLs by mouth every 4 (four) hours as needed for cough.   . diclofenac sodium (VOLTAREN) 1 % GEL Apply 2 g topically 4 (four) times daily. Right shoulder  . Eyelid Cleansers (OCUSOFT EYELID CLEANSING) PADS Apply 1 application topically daily.  . feeding supplement (BOOST HIGH PROTEIN) LIQD Take 1 Container by mouth daily.   Marland Kitchen HYDROcodone-acetaminophen (NORCO) 5-325 MG tablet Take 1 tablet by mouth every 6 (six) hours as needed for severe pain.  Marland Kitchen ipratropium (ATROVENT) 0.02 % nebulizer solution Take 2.5 mLs (0.5 mg total) by nebulization 3 (three) times daily.  Marland Kitchen levalbuterol (XOPENEX) 1.25 MG/0.5ML nebulizer solution Take 1.25 mg by nebulization 3 (three) times daily.  Marland Kitchen levalbuterol (XOPENEX) 1.25 MG/3ML nebulizer solution Take 1.25 mg by nebulization every 6 (six) hours as needed for wheezing or shortness of breath. In addition to three times daily schedule  . loratadine (CLARITIN) 10 MG tablet Take 10 mg by mouth at bedtime.  . metoprolol succinate (TOPROL-XL) 25 MG 24 hr tablet Take 12.5 mg by mouth daily.  . OXYGEN Level 3 or 3 mL/minute when out of room every shift 1, 2, and 3  . Polyethyl Glyc-Propyl Glyc PF (SYSTANE PRESERVATIVE FREE) 0.4-0.3 % SOLN Apply 1 drop to eye 3 (three) times daily as needed (dry eyes). (Patient taking differently: Apply 2 drops to eye in the morning and at bedtime. And tid prn)  . polyethylene glycol (MIRALAX / GLYCOLAX) 17 g packet Take 17 g by mouth daily.   Marland Kitchen spironolactone (ALDACTONE) 25 MG tablet Take 12.5 mg by mouth daily.  Marland Kitchen torsemide (DEMADEX) 20  MG tablet Take 40 mg by mouth daily.   Marland Kitchen triamcinolone cream (KENALOG) 0.1 % Apply 1 application topically as needed. To right arm for itching  . valACYclovir (VALTREX) 1000 MG tablet Take 1,000 mg by mouth daily.   Marland Kitchen warfarin (COUMADIN) 1 MG tablet Take 1 mg by mouth every other day. M, W, F, Sun  . warfarin (COUMADIN) 2 MG tablet Take 2 mg by mouth 3 (three) times a week. Tuesday, Thursday, Sat   No facility-administered encounter medications on file as of 07/25/2019.    Review of Systems  Constitutional: Positive for fatigue. Negative for activity change, appetite change, chills and fever.  HENT: Positive for hearing loss. Negative for congestion and trouble swallowing.   Eyes: Negative for visual disturbance.  Respiratory: Positive for cough and wheezing. Negative for chest tightness and shortness of breath.        No change  Cardiovascular: Negative for chest pain, palpitations and leg swelling.  Gastrointestinal: Positive for constipation. Negative for abdominal pain, blood in stool, diarrhea, nausea and  vomiting.  Genitourinary: Negative for dysuria.  Musculoskeletal: Positive for arthralgias, gait problem and myalgias.  Skin:       Skin cancer areas as above  Neurological: Positive for weakness. Negative for dizziness.  Psychiatric/Behavioral: Negative for agitation, confusion and sleep disturbance. The patient is nervous/anxious.     Immunization History  Administered Date(s) Administered  . Influenza Split 12/07/2014  . Influenza, High Dose Seasonal PF 12/15/2018  . Influenza-Unspecified 11/05/2013, 12/02/2016, 12/16/2017  . Moderna SARS-COVID-2 Vaccination 03/07/2019, 04/04/2019  . Pneumococcal Polysaccharide-23 03/02/2012  . Tdap 03/02/2012  . Zoster 03/03/2011   Pertinent  Health Maintenance Due  Topic Date Due  . PNA vac Low Risk Adult (2 of 2 - PCV13) 03/02/2013  . INFLUENZA VACCINE  10/01/2019  . DEXA SCAN  Completed   Fall Risk  03/30/2019 01/13/2019  Falls in  the past year? 1 -  Number falls in past yr: 1 -  Injury with Fall? 1 -  Risk for fall due to : History of fall(s) History of fall(s);Impaired balance/gait;Impaired mobility;Medication side effect;Mental status change  Follow up Falls evaluation completed Falls evaluation completed;Education provided;Falls prevention discussed  Comment - done at [facility] as part of care plan   Functional Status Survey:    Vitals:   07/25/19 1634  BP: 91/60  Pulse: 66  Temp: 97.6 F (36.4 C)  SpO2: 96%  Weight: 142 lb (64.4 kg)  Height: _0  (1.626 m)   Body mass index is 24.37 kg/m. Physical Exam Vitals and nursing note reviewed.  Constitutional:      General: She is not in acute distress.    Appearance: Normal appearance. She is not toxic-appearing.  HENT:     Head: Normocephalic and atraumatic.     Nose: Nose normal.     Mouth/Throat:     Pharynx: Oropharynx is clear.  Eyes:     General:        Right eye: No discharge.        Left eye: No discharge.     Extraocular Movements: Extraocular movements intact.     Conjunctiva/sclera: Conjunctivae normal.     Pupils: Pupils are equal, round, and reactive to light.  Cardiovascular:     Rate and Rhythm: Rhythm irregular.  Pulmonary:     Effort: Pulmonary effort is normal.     Breath sounds: Wheezing present. No rhonchi or rales.     Comments: Wearing her oxygen Abdominal:     General: Bowel sounds are normal.     Palpations: Abdomen is soft.     Tenderness: There is no abdominal tenderness.  Musculoskeletal:        General: Normal range of motion.     Right lower leg: No edema.     Left lower leg: No edema.  Skin:    Comments: Three areas of skin cancer on chest wall, right forearm and right leg all dressed at present--nursing and resident both report improvement  Neurological:     General: No focal deficit present.     Mental Status: She is alert and oriented to person, place, and time.     Comments: not walking much  now--stays in recliner  Psychiatric:        Mood and Affect: Mood normal.     Labs reviewed: Recent Labs    11/14/18 0000 05/22/19 0000 05/22/19 0600 06/22/19 1250  NA 143 138  --  138  K 4.0 4.4  --  3.9  CL  --  93*  --  25*  CO2  --  30*  --  95*  BUN 49* 52*  --  40*  CREATININE 1.1 1.2*  --  1.1  CALCIUM  --   --  10.8* 8.8   Recent Labs    06/22/19 1250  AST 18  ALT 8  ALBUMIN 3.8   Recent Labs    11/01/18 0600 05/22/19 0600 06/22/19 1250  WBC 5.9 12.5 21.6  HGB 12.8 14.4 15.8  HCT 38 43 47*  PLT 167 212 228   Lab Results  Component Value Date   TSH 6.46 (A) 05/22/2019   No results found for: HGBA1C No results found for: CHOL, HDL, LDLCALC, LDLDIRECT, TRIG, CHOLHDL  Assessment/Plan 1. Arterial leg ulcer ([place]) -some improvement, cont orders as above  2. 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 Permanent atrial fibrillation (HCC) -rate controlled on metoprolol--if bp remains so low, may need to adjust (lowering diuretic has not been successful)  5. Hypercoagulable state due to atrial fibrillation (HCC) -goal INR 2-3--trying to get back to monthly INRs if she does not require abx for any infections that alter warfarin levels  6. Chronic constipation -cont current regimen and monitor--she notes it's not predictable despite daily miralax so must be affected by intake  7. Chronic respiratory failure with hypoxia (HCC) -due to pulmonary htn, chf, copd  Family/ staff Communication: discussed with snf nurse  Labs/tests ordered:  No new  Bennet Kujawa L. Lee Kuang, D.O. [facility] [address], [place] [address] Cell Phone (Mon-Fri 8am-5pm):  [phone] On Call:  [phone] & follow prompts  after 5pm & weekends Office Phone:  [phone] Office Fax:  [phone]

## 2019-08-03 ENCOUNTER — Encounter: Payer: Self-pay | Admitting: Adult Health

## 2019-08-03 ENCOUNTER — Non-Acute Institutional Stay (SKILLED_NURSING_FACILITY): Payer: Medicare Other | Admitting: Adult Health

## 2019-08-03 DIAGNOSIS — Z7901 Long term (current) use of anticoagulants: Secondary | ICD-10-CM | POA: Diagnosis not present

## 2019-08-03 DIAGNOSIS — D6869 Other thrombophilia: Secondary | ICD-10-CM

## 2019-08-03 DIAGNOSIS — I4891 Unspecified atrial fibrillation: Secondary | ICD-10-CM

## 2019-08-03 LAB — POCT INR: INR: 2.5 — AB (ref 0.9–1.1)

## 2019-08-03 LAB — PROTIME-INR: Protime: 27.3 — AB (ref 10.0–13.8)

## 2019-08-03 NOTE — Progress Notes (Signed)
Location:  [facility] of Service:  SNF (31) Provider:   Cindi Garza, ANP [place] [phone]   Jessica Curry, DO  Patient Care Team: Jessica Curry, DO as PCP - General (Geriatric Medicine) Jessica Klein, MD as PCP - Cardiology (Cardiology)  Extended Emergency Contact Information Primary Emergency Contact: [name] of [place] Phone: [phone] Work Phone: [phone] Mobile Phone: [phone] Relation: Daughter Secondary Emergency Contact: [name], [name] Work Phone: [phone] Mobile Phone: [phone] Relation: Son  Code Status:  DNR Goals of care: Advanced Directive information Advanced Directives 07/25/2019  Does Patient Have a Medical Advance Directive? Yes  Type of Advance Directive -  Does patient want to make changes to medical advance directive? No - Patient declined  Copy of [place] in Chart? Yes - validated most recent copy scanned in chart (See row information)  Would patient like information on creating a medical advance directive? -  Pre-existing out of facility DNR order (yellow form or pink MOST form) -     Chief Complaint  Patient presents with   Acute Visit    coumadin management     HPI:  Pt is a 86 y.o. female seen today for an acute visit for coumadin management. She is on coumadin for a hx of afib for CVA risk reduction. There are no reports of excessive bleeding or bruising.  Lab Results  Component Value Date   INR 2.5 (A) 08/03/2019   INR 3.0 (A) 07/20/2019   INR 1.6 (A) 07/13/2019   PROTIME 27.3 (A) 08/03/2019   PROTIME 31.2 (A) 07/20/2019   PROTIME 18.8 (A) 07/13/2019      Past Medical History:  Diagnosis Date   Arterial leg ulcer ([place]) 10/14/2018   Breast cancer ([place])    left    Centrilobular emphysema ([place]) 12/23/2017   Chronic anticoagulation    Chronic atrial fibrillation (HCC)    Chronic respiratory failure with  hypoxia ([place]) 03/26/2018   CKD (chronic kidney disease), stage III    Cor pulmonale, chronic (HCC) 06/13/2013   Pulmonary artery pressure 72 06/15/17 with severe tricuspid regurg   Essential hypertension 06/13/2013   Hypertension    PAH (pulmonary artery hypertension) ([place]) 03/25/2017   Pulmonary artery pressure 72 06/15/17 with severe tricuspid regurg   Pelvic fracture ([place]) 05/2016   Permanent atrial fibrillation ([place]) 03/26/2018   Past Surgical History:  Procedure Laterality Date   ABDOMINAL HYSTERECTOMY     BREAST LUMPECTOMY      Allergies  Allergen Reactions   Aspirin     Upset stomach   Neosporin [Bacitracin-Polymyxin B]     Unsure of reaction   Codeine Nausea Only    Nausea     Outpatient Encounter Medications as of 08/03/2019  Medication Sig   acetaminophen (TYLENOL) 325 MG tablet Take 650 mg by mouth 2 (two) times daily.   allopurinol (ZYLOPRIM) 100 MG tablet Take 100 mg by mouth at bedtime.    ALPRAZolam (XANAX) 0.5 MG tablet Take 1 tablet (0.5 mg total) by mouth at bedtime.   antiseptic oral rinse (BIOTENE) LIQD 15 mLs by Mouth Rinse route as needed for dry mouth.   benzonatate (TESSALON) 100 MG capsule Take 100 mg by mouth 3 (three) times daily as needed for cough.   Calcium Carbonate-Vitamin D3 (CALCIUM 600-D) 600-400 MG-UNIT TABS Take 1 tablet by mouth daily.   collagenase (SANTYL) ointment Apply 1 application topically every other day.   dextromethorphan (DELSYM) 30  MG/5ML liquid Take 5 mLs by mouth every 4 (four) hours as needed for cough.    diclofenac sodium (VOLTAREN) 1 % GEL Apply 2 g topically 4 (four) times daily. Right shoulder   Eyelid Cleansers (OCUSOFT EYELID CLEANSING) PADS Apply 1 application topically daily.   feeding supplement (BOOST HIGH PROTEIN) LIQD Take 1 Container by mouth daily.    HYDROcodone-acetaminophen (NORCO) 5-325 MG tablet Take 1 tablet by mouth every 6 (six) hours as needed for severe pain.   ipratropium  (ATROVENT) 0.02 % nebulizer solution Take 2.5 mLs (0.5 mg total) by nebulization 3 (three) times daily.   levalbuterol (XOPENEX) 1.25 MG/0.5ML nebulizer solution Take 1.25 mg by nebulization 3 (three) times daily.   levalbuterol (XOPENEX) 1.25 MG/3ML nebulizer solution Take 1.25 mg by nebulization every 6 (six) hours as needed for wheezing or shortness of breath. In addition to three times daily schedule   loratadine (CLARITIN) 10 MG tablet Take 10 mg by mouth at bedtime.   metoprolol succinate (TOPROL-XL) 25 MG 24 hr tablet Take 12.5 mg by mouth daily.   OXYGEN Level 3 or 3 mL/minute when out of room every shift 1, 2, and 3   Polyethyl Glyc-Propyl Glyc PF (SYSTANE PRESERVATIVE FREE) 0.4-0.3 % SOLN Apply 1 drop to eye 3 (three) times daily as needed (dry eyes). (Patient taking differently: Apply 2 drops to eye in the morning and at bedtime. And tid prn)   polyethylene glycol (MIRALAX / GLYCOLAX) 17 g packet Take 17 g by mouth daily.    spironolactone (ALDACTONE) 25 MG tablet Take 12.5 mg by mouth daily.   torsemide (DEMADEX) 20 MG tablet Take 40 mg by mouth daily.    triamcinolone cream (KENALOG) 0.1 % Apply 1 application topically as needed. To right arm for itching   valACYclovir (VALTREX) 1000 MG tablet Take 1,000 mg by mouth daily.    warfarin (COUMADIN) 1 MG tablet Take 1 mg by mouth every other day. M, W, F, Sun   warfarin (COUMADIN) 2 MG tablet Take 2 mg by mouth 3 (three) times a week. Tuesday, Thursday, Sat   No facility-administered encounter medications on file as of 08/03/2019.    Review of Systems  Constitutional: Negative for activity change, appetite change, chills, diaphoresis, fatigue, fever and unexpected weight change.  Hematological: Negative for adenopathy. Does not bruise/bleed easily.    Immunization History  Administered Date(s) Administered   Influenza Split 12/07/2014   Influenza, High Dose Seasonal PF 12/15/2018   Influenza-Unspecified 11/05/2013,  12/02/2016, 12/16/2017   Moderna SARS-COVID-2 Vaccination 03/07/2019, 04/04/2019   Pneumococcal Polysaccharide-23 03/02/2012   Tdap 03/02/2012   Zoster 03/03/2011   Pertinent  Health Maintenance Due  Topic Date Due   PNA vac Low Risk Adult (2 of 2 - PCV13) 03/02/2013   INFLUENZA VACCINE  10/01/2019   DEXA SCAN  Completed   Fall Risk  03/30/2019 01/13/2019  Falls in the past year? 1 -  Number falls in past yr: 1 -  Injury with Fall? 1 -  Risk for fall due to : History of fall(s) History of fall(s);Impaired balance/gait;Impaired mobility;Medication side effect;Mental status change  Follow up Falls evaluation completed Falls evaluation completed;Education provided;Falls prevention discussed  Comment - done at [facility] as part of care plan   Functional Status Survey:    There were no vitals filed for this visit. There is no height or weight on file to calculate BMI. Physical Exam Vitals and nursing note reviewed.  Neurological:     General: No focal deficit  present.     Mental Status: She is alert. Mental status is at baseline.  Psychiatric:        Mood and Affect: Mood normal.     Labs reviewed: Recent Labs    11/14/18 0000 05/22/19 0000 05/22/19 0600 06/22/19 1250  NA 143 138  --  138  K 4.0 4.4  --  3.9  CL  --  93*  --  25*  CO2  --  30*  --  95*  BUN 49* 52*  --  40*  CREATININE 1.1 1.2*  --  1.1  CALCIUM  --   --  10.8* 8.8   Recent Labs    06/22/19 1250  AST 18  ALT 8  ALBUMIN 3.8   Recent Labs    11/01/18 0600 05/22/19 0600 06/22/19 1250  WBC 5.9 12.5 21.6  HGB 12.8 14.4 15.8  HCT 38 43 47*  PLT 167 212 228   Lab Results  Component Value Date   TSH 6.46 (A) 05/22/2019   No results found for: HGBA1C No results found for: CHOL, HDL, LDLCALC, LDLDIRECT, TRIG, CHOLHDL  Significant Diagnostic Results in last 30 days:  No results found.  Assessment/Plan  1. Hypercoagulable state due to atrial fibrillation (HCC) Continues on Toprol  Xl 12.5 mg qd for rate control  2. Current use of long term anticoagulation Therapeutic, goal INR 2-3 Continue current dose of coumadin   Family/ staff Communication: discussed with the nurse   Labs/tests ordered: INR three weeks

## 2019-08-24 ENCOUNTER — Non-Acute Institutional Stay (SKILLED_NURSING_FACILITY): Payer: Medicare Other | Admitting: Adult Health

## 2019-08-24 ENCOUNTER — Encounter: Payer: Self-pay | Admitting: Adult Health

## 2019-08-24 DIAGNOSIS — C761 Malignant neoplasm of thorax: Secondary | ICD-10-CM

## 2019-08-24 DIAGNOSIS — J9611 Chronic respiratory failure with hypoxia: Secondary | ICD-10-CM | POA: Diagnosis not present

## 2019-08-24 DIAGNOSIS — D6869 Other thrombophilia: Secondary | ICD-10-CM

## 2019-08-24 DIAGNOSIS — I4821 Permanent atrial fibrillation: Secondary | ICD-10-CM

## 2019-08-24 DIAGNOSIS — J432 Centrilobular emphysema: Secondary | ICD-10-CM

## 2019-08-24 DIAGNOSIS — Z86007 Personal history of in-situ neoplasm of skin: Secondary | ICD-10-CM

## 2019-08-24 DIAGNOSIS — L989 Disorder of the skin and subcutaneous tissue, unspecified: Secondary | ICD-10-CM

## 2019-08-24 DIAGNOSIS — I4891 Unspecified atrial fibrillation: Secondary | ICD-10-CM

## 2019-08-24 NOTE — Progress Notes (Signed)
Location:  Occupational psychologist of Service:  SNF (31) Provider:   Cindi Carbon, ANP [place] [phone]   [name], DO  Patient Care Team: [name], DO as PCP - General (Geriatric Medicine) Sanda Klein, MD as PCP - Cardiology (Cardiology)  Extended Emergency Contact Information Primary Emergency Contact: [name] of [place] Phone: [phone] Work Phone: [phone] Mobile Phone: [phone] Relation: Daughter Secondary Emergency Contact: Breezy, [place] Work Phone: [phone] Mobile Phone: [phone] Relation: Son  Code Status: DNR Goals of care: Advanced Directive information Advanced Directives 07/25/2019  Does Patient Have a Medical Advance Directive? Yes  Type of Advance Directive -  Does patient want to make changes to medical advance directive? No - Patient declined  Copy of [place] in Chart? Yes - validated most recent copy scanned in chart (See row information)  Would patient like information on creating a medical advance directive? -  Pre-existing out of facility DNR order (yellow form or pink MOST form) -     Chief Complaint  Patient presents with  . Medical Management of Chronic Issues    HPI:  Pt is a 86 y.o. female seen today for medical management of chronic diseases.    Afib: rate remains controlled with no sob or palpitations. Takes coumadin. INR 3.0 on 6/24.  Pt reports regular diet with minimal variation. Does not like vegetables. INR is variable   COPD: has a chronic cough with sputum production that is unchanged. Has DOE with longer periods of ambulation which is unchanged.   Continues on oxygen for a hx of pulmonary htn/COPD.  She has two large skin lesions that are SCC to the chest and one large lesion thought to be SCC due to the right forearm. She declined intervention by dermatology. These areas can be uncomfortable and are prone to  infection. For my visit there are no concerns of fever, drainage, or swelling  She has a new red scaly lesion to the RUA.    Past Medical History:  Diagnosis Date  . Arterial leg ulcer ([place]) 10/14/2018  . Breast cancer ([place])    left   . Centrilobular emphysema ([place]) 12/23/2017  . Chronic anticoagulation   . Chronic atrial fibrillation ([place])   . Chronic respiratory failure with hypoxia ([place]) 03/26/2018  . CKD (chronic kidney disease), stage III   . Cor pulmonale, chronic ([place]) 06/13/2013   Pulmonary artery pressure 72 06/15/17 with severe tricuspid regurg  . Essential hypertension 06/13/2013  . Hypertension   . PAH (pulmonary artery hypertension) ([place]) 03/25/2017   Pulmonary artery pressure 72 06/15/17 with severe tricuspid regurg  . Pelvic fracture ([place]) 05/2016  . Permanent atrial fibrillation ([place]) 03/26/2018   Past Surgical History:  Procedure Laterality Date  . ABDOMINAL HYSTERECTOMY    . BREAST LUMPECTOMY      Allergies  Allergen Reactions  . Aspirin     Upset stomach  . Neosporin [Bacitracin-Polymyxin B]     Unsure of reaction  . Codeine Nausea Only    Nausea     Outpatient Encounter Medications as of 08/24/2019  Medication Sig  . acetaminophen (TYLENOL) 325 MG tablet Take 650 mg by mouth 2 (two) times daily.  Marland Kitchen allopurinol (ZYLOPRIM) 100 MG tablet Take 100 mg by mouth at bedtime.   . ALPRAZolam (XANAX) 0.5 MG tablet Take 1 tablet (0.5 mg total) by mouth at bedtime.  Marland Kitchen antiseptic oral rinse (BIOTENE) LIQD 15 mLs by Mouth Rinse  route as needed for dry mouth.  . benzonatate (TESSALON) 100 MG capsule Take 100 mg by mouth 3 (three) times daily as needed for cough.  . Calcium Carbonate-Vitamin D3 (CALCIUM 600-D) 600-400 MG-UNIT TABS Take 1 tablet by mouth daily.  . collagenase (SANTYL) ointment Apply 1 application topically every other day.  Marland Kitchen dextromethorphan (DELSYM) 30 MG/5ML liquid Take 5 mLs by mouth every 4 (four) hours as needed for cough.   . diclofenac sodium  (VOLTAREN) 1 % GEL Apply 2 g topically 4 (four) times daily. Right shoulder  . Eyelid Cleansers (OCUSOFT EYELID CLEANSING) PADS Apply 1 application topically daily.  . feeding supplement (BOOST HIGH PROTEIN) LIQD Take 1 Container by mouth daily.   Marland Kitchen HYDROcodone-acetaminophen (NORCO) 5-325 MG tablet Take 1 tablet by mouth every 6 (six) hours as needed for severe pain. (Patient taking differently: Take 1 tablet by mouth every 4 (four) hours as needed for severe pain. )  . ipratropium (ATROVENT) 0.02 % nebulizer solution Take 2.5 mLs (0.5 mg total) by nebulization 3 (three) times daily.  Marland Kitchen levalbuterol (XOPENEX) 1.25 MG/0.5ML nebulizer solution Take 1.25 mg by nebulization 3 (three) times daily.  Marland Kitchen levalbuterol (XOPENEX) 1.25 MG/3ML nebulizer solution Take 1.25 mg by nebulization every 6 (six) hours as needed for wheezing or shortness of breath. In addition to three times daily schedule  . loratadine (CLARITIN) 10 MG tablet Take 10 mg by mouth at bedtime.  . metoprolol succinate (TOPROL-XL) 25 MG 24 hr tablet Take 12.5 mg by mouth daily.  . OXYGEN Level 3 or 3 mL/minute when out of room every shift 1, 2, and 3  . Polyethyl Glyc-Propyl Glyc PF (SYSTANE PRESERVATIVE FREE) 0.4-0.3 % SOLN Apply 1 drop to eye 3 (three) times daily as needed (dry eyes). (Patient taking differently: Apply 2 drops to eye in the morning and at bedtime. And tid prn)  . polyethylene glycol (MIRALAX / GLYCOLAX) 17 g packet Take 17 g by mouth daily.   Marland Kitchen spironolactone (ALDACTONE) 25 MG tablet Take 12.5 mg by mouth daily.  Marland Kitchen torsemide (DEMADEX) 20 MG tablet Take 40 mg by mouth daily.   Marland Kitchen triamcinolone cream (KENALOG) 0.1 % Apply 1 application topically as needed. To right arm for itching  . warfarin (COUMADIN) 1 MG tablet Take 1 mg by mouth every other day. M, W, F, Sun  . warfarin (COUMADIN) 2 MG tablet Take 2 mg by mouth 3 (three) times a week. Tuesday, Thursday, Sat  . valACYclovir (VALTREX) 1000 MG tablet Take 1,000 mg by mouth  daily.    No facility-administered encounter medications on file as of 08/24/2019.    Review of Systems  Constitutional: Negative for activity change, appetite change, chills, diaphoresis, fatigue, fever and unexpected weight change.  HENT: Negative for congestion.   Respiratory: Positive for cough (chronic) and shortness of breath (on exertion chronic). Negative for wheezing.   Cardiovascular: Negative for chest pain, palpitations and leg swelling.  Gastrointestinal: Negative for abdominal distention, abdominal pain, constipation and diarrhea.  Genitourinary: Negative for difficulty urinating and dysuria.  Musculoskeletal: Positive for arthralgias and gait problem. Negative for back pain, joint swelling and myalgias.  Skin: Positive for rash and wound.  Neurological: Negative for dizziness, tremors, seizures, syncope, facial asymmetry, speech difficulty, weakness, light-headedness, numbness and headaches.  Psychiatric/Behavioral: Negative for agitation, behavioral problems and confusion.    Immunization History  Administered Date(s) Administered  . Influenza Split 12/07/2014  . Influenza, High Dose Seasonal PF 12/15/2018  . Influenza-Unspecified 11/05/2013, 12/02/2016, 12/16/2017  .  Moderna SARS-COVID-2 Vaccination 03/07/2019, 04/04/2019  . Pneumococcal Polysaccharide-23 03/02/2012  . Tdap 03/02/2012  . Zoster 03/03/2011   Pertinent  Health Maintenance Due  Topic Date Due  . PNA vac Low Risk Adult (2 of 2 - PCV13) 03/02/2013  . INFLUENZA VACCINE  10/01/2019  . DEXA SCAN  Completed   Fall Risk  03/30/2019 01/13/2019  Falls in the past year? 1 -  Number falls in past yr: 1 -  Injury with Fall? 1 -  Risk for fall due to : History of fall(s) History of fall(s);Impaired balance/gait;Impaired mobility;Medication side effect;Mental status change  Follow up Falls evaluation completed Falls evaluation completed;Education provided;Falls prevention discussed  Comment - done at [facility]  as part of care plan   Functional Status Survey:    There were no vitals filed for this visit. There is no height or weight on file to calculate BMI. Physical Exam Vitals and nursing note reviewed.  Constitutional:      General: She is not in acute distress.    Appearance: She is not diaphoretic.  HENT:     Head: Normocephalic and atraumatic.  Neck:     Vascular: No JVD.  Cardiovascular:     Rate and Rhythm: Normal rate. Rhythm irregular.     Heart sounds: No murmur heard.   Pulmonary:     Effort: Pulmonary effort is normal. No respiratory distress.     Breath sounds: No wheezing.     Comments: Decreased bases Abdominal:     General: Bowel sounds are normal. There is no distension.     Palpations: Abdomen is soft.     Comments: Gaining weight, abd growing  Musculoskeletal:     Right lower leg: No edema.     Left lower leg: No edema.  Skin:    General: Skin is warm and dry.       Neurological:     Mental Status: She is alert and oriented to person, place, and time.  Psychiatric:        Mood and Affect: Mood normal.     Labs reviewed: Recent Labs    11/14/18 0000 05/22/19 0000 05/22/19 0600 06/22/19 1250  NA 143 138  --  138  K 4.0 4.4  --  3.9  CL  --  93*  --  25*  CO2  --  30*  --  95*  BUN 49* 52*  --  40*  CREATININE 1.1 1.2*  --  1.1  CALCIUM  --   --  10.8* 8.8   Recent Labs    06/22/19 1250  AST 18  ALT 8  ALBUMIN 3.8   Recent Labs    11/01/18 0600 05/22/19 0600 06/22/19 1250  WBC 5.9 12.5 21.6  HGB 12.8 14.4 15.8  HCT 38 43 47*  PLT 167 212 228   Lab Results  Component Value Date   TSH 6.46 (A) 05/22/2019   No results found for: HGBA1C No results found for: CHOL, HDL, LDLCALC, LDLDIRECT, TRIG, CHOLHDL  Significant Diagnostic Results in last 30 days:  No results found.  Assessment/Plan 1. Hypercoagulable state due to atrial fibrillation (HCC) Continue current dose of coumadin but recheck in 1 week as she is on the high end of  therapeutic  2. Permanent atrial fibrillation (HCC) Rate is controlled with metoprolol  3. Centrilobular emphysema (HCC) No exacerbations, continue xopenex and atrovent tid  4. Chronic respiratory failure with hypoxia (HCC) Continue oxygen at 2-3 liters for hx of COPD/pulmonary htn  5. Skin lesion There is an area of concern to the RUA, will try triamcinolone  Pt does not want to pursue additional work up  6. 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 Continue dressing changes and santyl per nsg staff. Monitor for s/s of infection    Family/ staff Communication: resident  Labs/tests ordered:  INR in 1 wk

## 2019-08-25 DIAGNOSIS — C761 Malignant neoplasm of thorax: Secondary | ICD-10-CM | POA: Insufficient documentation

## 2019-08-25 DIAGNOSIS — C4490 Unspecified malignant neoplasm of skin, unspecified: Secondary | ICD-10-CM | POA: Insufficient documentation

## 2019-08-25 DIAGNOSIS — Z86007 Personal history of in-situ neoplasm of skin: Secondary | ICD-10-CM | POA: Insufficient documentation

## 2019-08-30 NOTE — Telephone Encounter (Signed)
This encounter was created in error - please disregard.

## 2019-09-07 LAB — POCT INR: INR: 2.5 — AB (ref <=1.1)

## 2019-09-18 ENCOUNTER — Other Ambulatory Visit: Payer: Self-pay | Admitting: Adult Health

## 2019-09-18 DIAGNOSIS — L97909 Non-pressure chronic ulcer of unspecified part of unspecified lower leg with unspecified severity: Secondary | ICD-10-CM

## 2019-09-18 MED ORDER — HYDROCODONE-ACETAMINOPHEN 5-325 MG PO TABS: 1.0000 | ORAL_TABLET | Freq: Four times a day (QID) | ORAL | 0 refills | Status: DC | PRN

## 2019-09-21 ENCOUNTER — Encounter: Payer: Self-pay | Admitting: Adult Health

## 2019-09-21 ENCOUNTER — Non-Acute Institutional Stay (SKILLED_NURSING_FACILITY): Payer: Medicare Other | Admitting: Adult Health

## 2019-09-21 DIAGNOSIS — J9611 Chronic respiratory failure with hypoxia: Secondary | ICD-10-CM | POA: Diagnosis not present

## 2019-09-21 DIAGNOSIS — C4490 Unspecified malignant neoplasm of skin, unspecified: Secondary | ICD-10-CM

## 2019-09-21 DIAGNOSIS — J432 Centrilobular emphysema: Secondary | ICD-10-CM | POA: Diagnosis not present

## 2019-09-21 DIAGNOSIS — D72829 Elevated white blood cell count, unspecified: Secondary | ICD-10-CM | POA: Diagnosis not present

## 2019-09-21 DIAGNOSIS — B373 Candidiasis of vulva and vagina: Secondary | ICD-10-CM

## 2019-09-21 DIAGNOSIS — R635 Abnormal weight gain: Secondary | ICD-10-CM

## 2019-09-21 DIAGNOSIS — B3731 Acute candidiasis of vulva and vagina: Secondary | ICD-10-CM

## 2019-09-21 NOTE — Progress Notes (Signed)
Location:  Occupational psychologist of Service:  SNF (31) Provider:   Cindi Carbon, ANP [place] [phone]  [name], DO  Patient Care Team: [name], DO as PCP - General (Geriatric Medicine) Sanda Klein, MD as PCP - Cardiology (Cardiology)  Extended Emergency Contact Information Primary Emergency Contact: [name] of [place] Phone: [phone] Work Phone: [phone] Mobile Phone: [phone] Relation: Daughter Secondary Emergency Contact: Amoy, [place] Work Phone: [phone] Mobile Phone: [phone] Relation: Son  Code Status:  DNR Goals of care: Advanced Directive information Advanced Directives 07/25/2019  Does Patient Have a Medical Advance Directive? Yes  Type of Advance Directive -  Does patient want to make changes to medical advance directive? No - Patient declined  Copy of [place] in Chart? Yes - validated most recent copy scanned in chart (See row information)  Would patient like information on creating a medical advance directive? -  Pre-existing out of facility DNR order (yellow form or pink MOST form) -     Chief Complaint  Patient presents with   Medical Management of Chronic Issues    HPI:  Pt is a 86 y.o. female seen today for medical management of chronic diseases.    She continues with large cancerous skin lesions to her right forearm and chest that require regular dressing changes and are painful at times. She uses norco for the pain and this seems to help.  She also has a wound to her right lower ext that requires dressing changes that is likely an arterial ulcer. She has requested comfort care measures for all of these issues and is content with her current regimen.   She reports some vaginal irritation and is requesting "cream"  COPD: continues on oxygen with normal 02 sats. Has a chronic cough with minimal sputum production that is unchanged.  Chronic DOE that is unchanged. She is ambulating less.  Continues to love to drink boost milk shakes at night and eat dessert. Gaining weight but does not want to change her regimen.  No edema in her legs or pnd.  She was also noted to have some abnormalities to her CBC earlier this year but decline further evaluation with a hematology referral Lab Results  Component Value Date   WBC 16.4 07/13/2019   HGB 14.7 07/13/2019   HCT 45 07/13/2019   MCV 109.1 (H) 03/15/2018   PLT 256 07/13/2019      Past Medical History:  Diagnosis Date   Arterial leg ulcer ([place]) 10/14/2018   Breast cancer ([place])    left    Centrilobular emphysema ([place]) 12/23/2017   Chronic anticoagulation    Chronic atrial fibrillation (HCC)    Chronic respiratory failure with hypoxia ([place]) 03/26/2018   CKD (chronic kidney disease), stage III    Cor pulmonale, chronic (HCC) 06/13/2013   Pulmonary artery pressure 72 06/15/17 with severe tricuspid regurg   Essential hypertension 06/13/2013   Hypertension    PAH (pulmonary artery hypertension) ([place]) 03/25/2017   Pulmonary artery pressure 72 06/15/17 with severe tricuspid regurg   Pelvic fracture ([place]) 05/2016   Permanent atrial fibrillation ([place]) 03/26/2018   Past Surgical History:  Procedure Laterality Date   ABDOMINAL HYSTERECTOMY     BREAST LUMPECTOMY      Allergies  Allergen Reactions   Aspirin     Upset stomach   Neosporin [Bacitracin-Polymyxin B]     Unsure of reaction   Codeine Nausea Only  Nausea     Outpatient Encounter Medications as of 09/21/2019  Medication Sig   acetaminophen (TYLENOL) 325 MG tablet Take 650 mg by mouth 2 (two) times daily.   allopurinol (ZYLOPRIM) 100 MG tablet Take 100 mg by mouth at bedtime.    ALPRAZolam (XANAX) 0.5 MG tablet Take 1 tablet (0.5 mg total) by mouth at bedtime.   antiseptic oral rinse (BIOTENE) LIQD 15 mLs by Mouth Rinse route as needed for dry mouth.   benzonatate (TESSALON) 100 MG  capsule Take 100 mg by mouth 3 (three) times daily as needed for cough.   Calcium Carbonate-Vitamin D3 (CALCIUM 600-D) 600-400 MG-UNIT TABS Take 1 tablet by mouth daily.   collagenase (SANTYL) ointment Apply 1 application topically every other day.   dextromethorphan (DELSYM) 30 MG/5ML liquid Take 5 mLs by mouth every 4 (four) hours as needed for cough.    diclofenac sodium (VOLTAREN) 1 % GEL Apply 2 g topically 4 (four) times daily. Right shoulder   Eyelid Cleansers (OCUSOFT EYELID CLEANSING) PADS Apply 1 application topically daily.   feeding supplement (BOOST HIGH PROTEIN) LIQD Take 1 Container by mouth daily.    HYDROcodone-acetaminophen (NORCO) 5-325 MG tablet Take 1 tablet by mouth every 6 (six) hours as needed for severe pain.   ipratropium (ATROVENT) 0.02 % nebulizer solution Take 2.5 mLs (0.5 mg total) by nebulization 3 (three) times daily.   levalbuterol (XOPENEX) 1.25 MG/0.5ML nebulizer solution Take 1.25 mg by nebulization 3 (three) times daily.   levalbuterol (XOPENEX) 1.25 MG/3ML nebulizer solution Take 1.25 mg by nebulization every 6 (six) hours as needed for wheezing or shortness of breath. In addition to three times daily schedule   loratadine (CLARITIN) 10 MG tablet Take 10 mg by mouth at bedtime.   metoprolol succinate (TOPROL-XL) 25 MG 24 hr tablet Take 12.5 mg by mouth daily.   OXYGEN Level 3 or 3 mL/minute when out of room every shift 1, 2, and 3   Polyethyl Glyc-Propyl Glyc PF (SYSTANE PRESERVATIVE FREE) 0.4-0.3 % SOLN Apply 1 drop to eye 3 (three) times daily as needed (dry eyes). (Patient taking differently: Apply 2 drops to eye in the morning and at bedtime. And tid prn)   polyethylene glycol (MIRALAX / GLYCOLAX) 17 g packet Take 17 g by mouth daily.    spironolactone (ALDACTONE) 25 MG tablet Take 12.5 mg by mouth daily.   torsemide (DEMADEX) 20 MG tablet Take 40 mg by mouth daily.    triamcinolone cream (KENALOG) 0.1 % Apply 1 application topically as  needed. To right arm for itching   valACYclovir (VALTREX) 1000 MG tablet Take 1,000 mg by mouth daily.    warfarin (COUMADIN) 1 MG tablet Take 1 mg by mouth every other day. M, W, F, Sun   warfarin (COUMADIN) 2 MG tablet Take 2 mg by mouth 3 (three) times a week. Tuesday, Thursday, Sat   No facility-administered encounter medications on file as of 09/21/2019.    Review of Systems  Constitutional: Negative for activity change, appetite change, chills, diaphoresis, fatigue, fever and unexpected weight change.       Reports she is gaining weight  HENT: Negative for congestion.   Eyes: Negative for visual disturbance.  Respiratory: Positive for cough (chronic ). Negative for shortness of breath (on exertion chronic) and wheezing.   Cardiovascular: Negative for chest pain, palpitations and leg swelling.  Gastrointestinal: Negative for abdominal distention, abdominal pain, constipation and diarrhea.  Genitourinary: Positive for dysuria. Negative for decreased urine volume, difficulty urinating, menstrual  problem, pelvic pain, urgency, vaginal bleeding and vaginal discharge.  Musculoskeletal: Positive for gait problem. Negative for arthralgias, back pain, joint swelling and myalgias.  Skin: Positive for rash and wound.  Neurological: Negative for dizziness, tremors, seizures, syncope, facial asymmetry, speech difficulty, weakness, light-headedness, numbness and headaches.  Psychiatric/Behavioral: Negative for agitation, behavioral problems and confusion.    Immunization History  Administered Date(s) Administered   Influenza Split 12/07/2014   Influenza, High Dose Seasonal PF 12/15/2018   Influenza-Unspecified 11/05/2013, 12/02/2016, 12/16/2017   Moderna SARS-COVID-2 Vaccination 03/07/2019, 04/04/2019   Pneumococcal Polysaccharide-23 03/02/2012   Tdap 03/02/2012   Zoster 03/03/2011   Pertinent  Health Maintenance Due  Topic Date Due   PNA vac Low Risk Adult (2 of 2 - PCV13)  03/02/2013   INFLUENZA VACCINE  10/01/2019   DEXA SCAN  Completed   Fall Risk  03/30/2019 01/13/2019  Falls in the past year? 1 -  Number falls in past yr: 1 -  Injury with Fall? 1 -  Risk for fall due to : History of fall(s) History of fall(s);Impaired balance/gait;Impaired mobility;Medication side effect;Mental status change  Follow up Falls evaluation completed Falls evaluation completed;Education provided;Falls prevention discussed  Comment - done at [facility] as part of care plan   Functional Status Survey:    Vitals:   09/21/19 1653  Weight: 151 lb 3.2 oz (68.6 kg)   Body mass index is 25.95 kg/m. Physical Exam Vitals and nursing note reviewed.  Constitutional:      General: She is not in acute distress.    Appearance: She is not diaphoretic.  HENT:     Head: Normocephalic and atraumatic.     Mouth/Throat:     Mouth: Mucous membranes are moist.     Pharynx: Oropharynx is clear. No oropharyngeal exudate.  Eyes:     Conjunctiva/sclera: Conjunctivae normal.     Pupils: Pupils are equal, round, and reactive to light.  Neck:     Vascular: No JVD.  Cardiovascular:     Rate and Rhythm: Normal rate. Rhythm irregular.     Heart sounds: No murmur heard.   Pulmonary:     Effort: Pulmonary effort is normal. No respiratory distress.     Breath sounds: No wheezing.     Comments: Decreased bases with long exp phase Abdominal:     General: Bowel sounds are normal. There is no distension.     Palpations: Abdomen is soft.     Tenderness: There is no abdominal tenderness.  Musculoskeletal:     Right lower leg: No edema.     Left lower leg: No edema.  Skin:    General: Skin is warm and dry.     Comments: Skin lesions to the chest, right lower ext, and right forearm covered with dressings CDI  Neurological:     Mental Status: She is alert and oriented to person, place, and time.  Psychiatric:        Mood and Affect: Mood normal.     Labs reviewed: Recent Labs     11/14/18 0000 05/22/19 0000 05/22/19 0600 06/22/19 0000 06/22/19 1250  NA 143 138  --  138  --   K 4.0 4.4  --  3.9  --   CL  --  93*  --  25*  --   CO2  --  30*  --  95*  --   BUN 49* 52*  --  40*  --   CREATININE 1.1 1.2*  --  1.1  --  CALCIUM  --   --  10.8*  --  8.8   Recent Labs    06/22/19 0000 06/22/19 1250  AST 18  --   ALT 8  --   ALBUMIN  --  3.8   Recent Labs    05/22/19 0600 06/22/19 0000 07/13/19 0000  WBC 12.5 21.6 16.4  NEUTROABS  --   --  11  HGB 14.4 15.8 14.7  HCT 43 47* 45  PLT 212 228 256   Lab Results  Component Value Date   TSH 6.46 (A) 05/22/2019   No results found for: HGBA1C No results found for: CHOL, HDL, LDLCALC, LDLDIRECT, TRIG, CHOLHDL  Significant Diagnostic Results in last 30 days:  No results found.  Assessment/Plan  1. Leukocytosis, unspecified type Noted on CBC with no further recommendations due to her goals of care Will monitor CBC periodically   2. Centrilobular emphysema ([place]) Controlled with no recent acute exac. Continue Atrovent and Xopenex tid   3. Chronic respiratory failure with hypoxia (HCC) Continue oxygen continuously due to hx of pulmonary htn and copd.   4. Skin cancer Continues dressing changes per wound care nurse at [facility]. No current signs of infection. Continue Norco for pain.   5. Vaginal candidiasis Nystatin to vaginal area qd x 10 days   6. Weight gain Due to boost shakes and increased intake with no increase in edema or change in resp status. She is not interested in discontinuing the boost. May opt to avoid dessert.    Family/ staff Communication: resident and her nurse Jasmine   Labs/tests ordered: NA

## 2019-10-05 ENCOUNTER — Encounter: Payer: Self-pay | Admitting: Adult Health

## 2019-10-05 ENCOUNTER — Non-Acute Institutional Stay (SKILLED_NURSING_FACILITY): Payer: Medicare Other | Admitting: Adult Health

## 2019-10-05 DIAGNOSIS — I4891 Unspecified atrial fibrillation: Secondary | ICD-10-CM

## 2019-10-05 DIAGNOSIS — Z7901 Long term (current) use of anticoagulants: Secondary | ICD-10-CM

## 2019-10-05 DIAGNOSIS — D6869 Other thrombophilia: Secondary | ICD-10-CM | POA: Diagnosis not present

## 2019-10-05 LAB — POCT INR: INR: 3.4 — AB (ref <=1.1)

## 2019-10-05 LAB — PROTIME-INR: Protime: 36 — AB (ref 10.0–13.8)

## 2019-10-05 NOTE — Progress Notes (Signed)
Location:  [facility] of Service:  SNF (31) Provider:   Cindi Carbon, ANP [place] [phone]   [name], DO  Patient Care Team: [name], DO as PCP - General (Geriatric Medicine) Sanda Klein, MD as PCP - Cardiology (Cardiology)  Extended Emergency Contact Information Primary Emergency Contact: [name] of [place] Phone: [phone] Work Phone: [phone] Mobile Phone: [phone] Relation: Daughter Secondary Emergency Contact: Trayonna, [place] Work Phone: [phone] Mobile Phone: [phone] Relation: Son  Code Status:  DNR Goals of care: Advanced Directive information Advanced Directives 07/25/2019  Does Patient Have a Medical Advance Directive? Yes  Type of Advance Directive -  Does patient want to make changes to medical advance directive? No - Patient declined  Copy of [place] in Chart? Yes - validated most recent copy scanned in chart (See row information)  Would patient like information on creating a medical advance directive? -  Pre-existing out of facility DNR order (yellow form or pink MOST form) -     Chief Complaint  Patient presents with  . Acute Visit    coumadin management     HPI:  Pt is a 86 y.o. female seen today for an acute visit for coumadin management. She is on coumadin for a hx of afib for CVA risk reduction. There are no reports of excessive bleeding or bruising. She denies any change in diet.   Lab Results  Component Value Date   INR 3.4 (A) 10/05/2019   INR 2.5 (A) 09/07/2019   INR 2.5 (A) 08/03/2019   PROTIME 36.0 (A) 10/05/2019   PROTIME 27.3 (A) 08/03/2019   PROTIME 31.2 (A) 07/20/2019      Past Medical History:  Diagnosis Date  . Arterial leg ulcer ([place]) 10/14/2018  . Breast cancer ([place])    left   . Centrilobular emphysema ([place]) 12/23/2017  . Chronic anticoagulation   . Chronic atrial fibrillation ([place])   .  Chronic respiratory failure with hypoxia ([place]) 03/26/2018  . CKD (chronic kidney disease), stage III   . Cor pulmonale, chronic ([place]) 06/13/2013   Pulmonary artery pressure 72 06/15/17 with severe tricuspid regurg  . Essential hypertension 06/13/2013  . Hypertension   . PAH (pulmonary artery hypertension) ([place]) 03/25/2017   Pulmonary artery pressure 72 06/15/17 with severe tricuspid regurg  . Pelvic fracture ([place]) 05/2016  . Permanent atrial fibrillation ([place]) 03/26/2018   Past Surgical History:  Procedure Laterality Date  . ABDOMINAL HYSTERECTOMY    . BREAST LUMPECTOMY      Allergies  Allergen Reactions  . Aspirin     Upset stomach  . Neosporin [Bacitracin-Polymyxin B]     Unsure of reaction  . Codeine Nausea Only    Nausea     Outpatient Encounter Medications as of 10/05/2019  Medication Sig  . acetaminophen (TYLENOL) 325 MG tablet Take 650 mg by mouth 2 (two) times daily.  Marland Kitchen allopurinol (ZYLOPRIM) 100 MG tablet Take 100 mg by mouth at bedtime.   . ALPRAZolam (XANAX) 0.5 MG tablet Take 1 tablet (0.5 mg total) by mouth at bedtime.  Marland Kitchen antiseptic oral rinse (BIOTENE) LIQD 15 mLs by Mouth Rinse route as needed for dry mouth.  . benzonatate (TESSALON) 100 MG capsule Take 100 mg by mouth 3 (three) times daily as needed for cough.  . Calcium Carbonate-Vitamin D3 (CALCIUM 600-D) 600-400 MG-UNIT TABS Take 1 tablet by mouth daily.  . collagenase (SANTYL) ointment Apply 1 application topically  every other day.  Marland Kitchen dextromethorphan (DELSYM) 30 MG/5ML liquid Take 5 mLs by mouth every 4 (four) hours as needed for cough.   . diclofenac sodium (VOLTAREN) 1 % GEL Apply 2 g topically 4 (four) times daily. Right shoulder  . Eyelid Cleansers (OCUSOFT EYELID CLEANSING) PADS Apply 1 application topically daily.  . feeding supplement (BOOST HIGH PROTEIN) LIQD Take 1 Container by mouth daily.   Marland Kitchen HYDROcodone-acetaminophen (NORCO) 5-325 MG tablet Take 1 tablet by mouth every 6 (six) hours as needed for  severe pain.  Marland Kitchen ipratropium (ATROVENT) 0.02 % nebulizer solution Take 2.5 mLs (0.5 mg total) by nebulization 3 (three) times daily.  Marland Kitchen levalbuterol (XOPENEX) 1.25 MG/0.5ML nebulizer solution Take 1.25 mg by nebulization 3 (three) times daily.  Marland Kitchen levalbuterol (XOPENEX) 1.25 MG/3ML nebulizer solution Take 1.25 mg by nebulization every 6 (six) hours as needed for wheezing or shortness of breath. In addition to three times daily schedule  . loratadine (CLARITIN) 10 MG tablet Take 10 mg by mouth at bedtime.  . metoprolol succinate (TOPROL-XL) 25 MG 24 hr tablet Take 12.5 mg by mouth daily.  . OXYGEN Level 3 or 3 mL/minute when out of room every shift 1, 2, and 3  . Polyethyl Glyc-Propyl Glyc PF (SYSTANE PRESERVATIVE FREE) 0.4-0.3 % SOLN Apply 1 drop to eye 3 (three) times daily as needed (dry eyes). (Patient taking differently: Apply 2 drops to eye in the morning and at bedtime. And tid prn)  . polyethylene glycol (MIRALAX / GLYCOLAX) 17 g packet Take 17 g by mouth daily.   Marland Kitchen spironolactone (ALDACTONE) 25 MG tablet Take 12.5 mg by mouth daily.  Marland Kitchen torsemide (DEMADEX) 20 MG tablet Take 40 mg by mouth daily.   Marland Kitchen triamcinolone cream (KENALOG) 0.1 % Apply 1 application topically as needed. To right arm for itching  . warfarin (COUMADIN) 1 MG tablet Take 1 mg by mouth every other day. M, W, F, Sun  . warfarin (COUMADIN) 2 MG tablet Take 2 mg by mouth 3 (three) times a week. Tuesday, Thursday, Sat  . [DISCONTINUED] valACYclovir (VALTREX) 1000 MG tablet Take 1,000 mg by mouth daily.    No facility-administered encounter medications on file as of 10/05/2019.    Review of Systems  Constitutional: Negative for activity change, appetite change, chills, diaphoresis, fatigue, fever and unexpected weight change.  Hematological: Negative for adenopathy. Does not bruise/bleed easily.    Immunization History  Administered Date(s) Administered  . Influenza Split 12/07/2014  . Influenza, High Dose Seasonal PF  12/15/2018  . Influenza-Unspecified 11/05/2013, 12/02/2016, 12/16/2017  . Moderna SARS-COVID-2 Vaccination 03/07/2019, 04/04/2019  . Pneumococcal Polysaccharide-23 03/02/2012  . Tdap 03/02/2012  . Zoster 03/03/2011   Pertinent  Health Maintenance Due  Topic Date Due  . PNA vac Low Risk Adult (2 of 2 - PCV13) 03/02/2013  . INFLUENZA VACCINE  10/01/2019  . DEXA SCAN  Completed   Fall Risk  03/30/2019 01/13/2019  Falls in the past year? 1 -  Number falls in past yr: 1 -  Injury with Fall? 1 -  Risk for fall due to : History of fall(s) History of fall(s);Impaired balance/gait;Impaired mobility;Medication side effect;Mental status change  Follow up Falls evaluation completed Falls evaluation completed;Education provided;Falls prevention discussed  Comment - done at [facility] as part of care plan   Functional Status Survey:    There were no vitals filed for this visit. There is no height or weight on file to calculate BMI. Physical Exam Vitals and nursing note reviewed.  Neurological:     General: No focal deficit present.     Mental Status: She is alert. Mental status is at baseline.  Psychiatric:        Mood and Affect: Mood normal.     Labs reviewed: Recent Labs    11/14/18 0000 05/22/19 0000 05/22/19 0600 06/22/19 0000 06/22/19 1250  NA 143 138  --  138  --   K 4.0 4.4  --  3.9  --   CL  --  93*  --  25*  --   CO2  --  30*  --  95*  --   BUN 49* 52*  --  40*  --   CREATININE 1.1 1.2*  --  1.1  --   CALCIUM  --   --  10.8*  --  8.8   Recent Labs    06/22/19 0000 06/22/19 1250  AST 18  --   ALT 8  --   ALBUMIN  --  3.8   Recent Labs    05/22/19 0600 06/22/19 0000 07/13/19 0000  WBC 12.5 21.6 16.4  NEUTROABS  --   --  11  HGB 14.4 15.8 14.7  HCT 43 47* 45  PLT 212 228 256   Lab Results  Component Value Date   TSH 6.46 (A) 05/22/2019   No results found for: HGBA1C No results found for: CHOL, HDL, LDLCALC, LDLDIRECT, TRIG, CHOLHDL  Significant  Diagnostic Results in last 30 days:  No results found.  Assessment/Plan  1. Hypercoagulable state due to atrial fibrillation (HCC) Continues on Toprol Xl 12.5 mg qd for rate control  2. Current use of long term anticoagulation Goal INR 2-3 Reduce coumadin to 2 mg on Tuesday and Thursday and 1 mg all other days.   Family/ staff Communication: discussed with the nurse   Labs/tests ordered: Recheck INR in 2 weeks

## 2019-10-06 ENCOUNTER — Non-Acute Institutional Stay (SKILLED_NURSING_FACILITY): Payer: Medicare Other | Admitting: Adult Health

## 2019-10-06 DIAGNOSIS — C761 Malignant neoplasm of thorax: Secondary | ICD-10-CM | POA: Diagnosis not present

## 2019-10-06 DIAGNOSIS — D6869 Other thrombophilia: Secondary | ICD-10-CM | POA: Diagnosis not present

## 2019-10-06 DIAGNOSIS — D0461 Carcinoma in situ of skin of right upper limb, including shoulder: Secondary | ICD-10-CM

## 2019-10-06 DIAGNOSIS — I4891 Unspecified atrial fibrillation: Secondary | ICD-10-CM | POA: Diagnosis not present

## 2019-10-09 ENCOUNTER — Encounter: Payer: Self-pay | Admitting: Adult Health

## 2019-10-09 ENCOUNTER — Other Ambulatory Visit: Payer: Self-pay | Admitting: Adult Health

## 2019-10-09 DIAGNOSIS — Z7901 Long term (current) use of anticoagulants: Secondary | ICD-10-CM | POA: Diagnosis not present

## 2019-10-09 DIAGNOSIS — B0052 Herpesviral keratitis: Secondary | ICD-10-CM | POA: Diagnosis not present

## 2019-10-09 MED ORDER — APIXABAN 5 MG PO TABS
5.0000 mg | ORAL_TABLET | Freq: Two times a day (BID) | ORAL | Status: DC
Start: 2019-10-09 — End: 2019-12-21

## 2019-10-09 NOTE — Progress Notes (Signed)
I discussed stopping Warfarin with her cardiologist, Dr. Sallyanne Kuster. He agreed and recommended changing to Eliquis 5 mg bid with food. I wrote an order to stop Coumadin and check INR qod until less than or = to 2 and then start the Eliquis.

## 2019-10-09 NOTE — Progress Notes (Signed)
Location:  Occupational psychologist of Service:  SNF (31) Provider:   Cindi Carbon, ANP [place] [phone]   [name], DO  Patient Care Team: [name], DO as PCP - General (Geriatric Medicine) Sanda Klein, MD as PCP - Cardiology (Cardiology)  Extended Emergency Contact Information Primary Emergency Contact: [name] of [place] Phone: [phone] Work Phone: [phone] Mobile Phone: [phone] Relation: Daughter Secondary Emergency Contact: Taylar, [place] Work Phone: [phone] Mobile Phone: [phone] Relation: Son  Code Status:  DNR Goals of care: Advanced Directive information Advanced Directives 07/25/2019  Does Patient Have a Medical Advance Directive? Yes  Type of Advance Directive -  Does patient want to make changes to medical advance directive? No - Patient declined  Copy of [place] in Chart? Yes - validated most recent copy scanned in chart (See row information)  Would patient like information on creating a medical advance directive? -  Pre-existing out of facility DNR order (yellow form or pink MOST form) -     Chief Complaint  Patient presents with  . Acute Visit    review of wounds    HPI:  Pt is a 86 y.o. female seen today for an acute visit for review of wounds. 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 The area is painful at times and she uses norco for this reason. She decided not to pursue treatment and therefore the area is dressed regularly and monitored for infection. The nurse changed the dressing on 8/5 and thought it appeared infected with increased drainage but as of today it appears improved. She also has lesions to the right and left chest wall. These are intermittently painful but not as much as the forearm. She has not had a fever. Small amt of increased drainage on the chest is noted. When I entered the room she complained about the  blood draws for her INR due to the fact that they stuck the right arm that already hurts and she is a difficult stick at times.    Past Medical History:  Diagnosis Date  . Arterial leg ulcer ([place]) 10/14/2018  . Breast cancer ([place])    left   . Centrilobular emphysema ([place]) 12/23/2017  . Chronic anticoagulation   . Chronic atrial fibrillation ([place])   . Chronic respiratory failure with hypoxia ([place]) 03/26/2018  . CKD (chronic kidney disease), stage III   . Cor pulmonale, chronic ([place]) 06/13/2013   Pulmonary artery pressure 72 06/15/17 with severe tricuspid regurg  . Essential hypertension 06/13/2013  . Hypertension   . PAH (pulmonary artery hypertension) ([place]) 03/25/2017   Pulmonary artery pressure 72 06/15/17 with severe tricuspid regurg  . Pelvic fracture ([place]) 05/2016  . Permanent atrial fibrillation ([place]) 03/26/2018   Past Surgical History:  Procedure Laterality Date  . ABDOMINAL HYSTERECTOMY    . BREAST LUMPECTOMY      Allergies  Allergen Reactions  . Aspirin     Upset stomach  . Neosporin [Bacitracin-Polymyxin B]     Unsure of reaction  . Codeine Nausea Only    Nausea     Outpatient Encounter Medications as of 10/06/2019  Medication Sig  . acetaminophen (TYLENOL) 325 MG tablet Take 650 mg by mouth 2 (two) times daily.  Marland Kitchen allopurinol (ZYLOPRIM) 100 MG tablet Take 100 mg by mouth at bedtime.   . ALPRAZolam (XANAX) 0.5 MG tablet Take 1 tablet (0.5 mg total) by mouth at bedtime.  Marland Kitchen  antiseptic oral rinse (BIOTENE) LIQD 15 mLs by Mouth Rinse route as needed for dry mouth.  . benzonatate (TESSALON) 100 MG capsule Take 100 mg by mouth 3 (three) times daily as needed for cough.  . Calcium Carbonate-Vitamin D3 (CALCIUM 600-D) 600-400 MG-UNIT TABS Take 1 tablet by mouth daily.  . collagenase (SANTYL) ointment Apply 1 application topically every other day.  Marland Kitchen dextromethorphan (DELSYM) 30 MG/5ML liquid Take 5 mLs by mouth every 4 (four) hours as needed for cough.   . diclofenac sodium  (VOLTAREN) 1 % GEL Apply 2 g topically 4 (four) times daily. Right shoulder  . Eyelid Cleansers (OCUSOFT EYELID CLEANSING) PADS Apply 1 application topically daily.  . feeding supplement (BOOST HIGH PROTEIN) LIQD Take 1 Container by mouth daily.   Marland Kitchen HYDROcodone-acetaminophen (NORCO) 5-325 MG tablet Take 1 tablet by mouth every 6 (six) hours as needed for severe pain.  Marland Kitchen ipratropium (ATROVENT) 0.02 % nebulizer solution Take 2.5 mLs (0.5 mg total) by nebulization 3 (three) times daily.  Marland Kitchen levalbuterol (XOPENEX) 1.25 MG/0.5ML nebulizer solution Take 1.25 mg by nebulization 3 (three) times daily.  Marland Kitchen levalbuterol (XOPENEX) 1.25 MG/3ML nebulizer solution Take 1.25 mg by nebulization every 6 (six) hours as needed for wheezing or shortness of breath. In addition to three times daily schedule  . loratadine (CLARITIN) 10 MG tablet Take 10 mg by mouth at bedtime.  . metoprolol succinate (TOPROL-XL) 25 MG 24 hr tablet Take 12.5 mg by mouth daily.  . OXYGEN Level 3 or 3 mL/minute when out of room every shift 1, 2, and 3  . Polyethyl Glyc-Propyl Glyc PF (SYSTANE PRESERVATIVE FREE) 0.4-0.3 % SOLN Apply 1 drop to eye 3 (three) times daily as needed (dry eyes). (Patient taking differently: Apply 2 drops to eye in the morning and at bedtime. And tid prn)  . polyethylene glycol (MIRALAX / GLYCOLAX) 17 g packet Take 17 g by mouth daily.   Marland Kitchen spironolactone (ALDACTONE) 25 MG tablet Take 12.5 mg by mouth daily.  Marland Kitchen torsemide (DEMADEX) 20 MG tablet Take 40 mg by mouth daily.   Marland Kitchen triamcinolone cream (KENALOG) 0.1 % Apply 1 application topically as needed. To right arm for itching  . warfarin (COUMADIN) 1 MG tablet Take 1 mg by mouth every other day. M, W, F, Sat, Sun  . warfarin (COUMADIN) 2 MG tablet Take 2 mg by mouth 2 (two) times a week. Tuesday, Thursday   No facility-administered encounter medications on file as of 10/06/2019.    Review of Systems  Constitutional: Negative for activity change, appetite change,  chills, diaphoresis, fatigue, fever and unexpected weight change.  Skin: Positive for wound. Negative for color change, pallor and rash.    Immunization History  Administered Date(s) Administered  . Influenza Split 12/07/2014  . Influenza, High Dose Seasonal PF 12/15/2018  . Influenza-Unspecified 11/05/2013, 12/02/2016, 12/16/2017  . Moderna SARS-COVID-2 Vaccination 03/07/2019, 04/04/2019  . Pneumococcal Polysaccharide-23 03/02/2012  . Tdap 03/02/2012  . Zoster 03/03/2011   Pertinent  Health Maintenance Due  Topic Date Due  . PNA vac Low Risk Adult (2 of 2 - PCV13) 03/02/2013  . INFLUENZA VACCINE  10/01/2019  . DEXA SCAN  Completed   Fall Risk  03/30/2019 01/13/2019  Falls in the past year? 1 -  Number falls in past yr: 1 -  Injury with Fall? 1 -  Risk for fall due to : History of fall(s) History of fall(s);Impaired balance/gait;Impaired mobility;Medication side effect;Mental status change  Follow up Falls evaluation completed Falls evaluation completed;Education  provided;Falls prevention discussed  Comment - done at [facility] as part of care plan   Functional Status Survey:    There were no vitals filed for this visit. There is no height or weight on file to calculate BMI. Physical Exam Skin:    Comments: Right forearm: 4cm x 3 cm 100% pink wound bed with small amt of serosang drainage and mild surrounding erythema.  Right chest wall wound: 100% pink wound bed with purulent drainage small amt noted around wound and on the dressing. Surrounding erythema noted and unchanged from prior exam.   Neurological:     Mental Status: She is alert and oriented to person, place, and time.  Psychiatric:        Mood and Affect: Mood normal.     Labs reviewed: Recent Labs    11/14/18 0000 05/22/19 0000 05/22/19 0600 06/22/19 0000 06/22/19 1250  NA 143 138  --  138  --   K 4.0 4.4  --  3.9  --   CL  --  93*  --  25*  --   CO2  --  30*  --  95*  --   BUN 49* 52*  --  40*  --     CREATININE 1.1 1.2*  --  1.1  --   CALCIUM  --   --  10.8*  --  8.8   Recent Labs    06/22/19 0000 06/22/19 1250  AST 18  --   ALT 8  --   ALBUMIN  --  3.8   Recent Labs    05/22/19 0600 06/22/19 0000 07/13/19 0000  WBC 12.5 21.6 16.4  NEUTROABS  --   --  11  HGB 14.4 15.8 14.7  HCT 43 47* 45  PLT 212 228 256   Lab Results  Component Value Date   TSH 6.46 (A) 05/22/2019   No results found for: HGBA1C No results found for: CHOL, HDL, LDLCALC, LDLDIRECT, TRIG, CHOLHDL  Significant Diagnostic Results in last 30 days:  No results found.  Assessment/Plan 1. 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 Recommend continuing hydrophilic foam dressing and santyl application.   2. Hypercoagulable state due to atrial fibrillation ([place]) Will discuss transitioning to Eliquis form Coumadin due to difficulty with lab draws   3. SCCIS of the right forearm Improved, recommend continuing Santyl application with hydrophilic foam dressing changes   Family/ staff Communication: discussed with the resident and her daughter Deedie   Labs/tests ordered:  NA

## 2019-10-11 DIAGNOSIS — Z7901 Long term (current) use of anticoagulants: Secondary | ICD-10-CM | POA: Diagnosis not present

## 2019-10-11 DIAGNOSIS — I4891 Unspecified atrial fibrillation: Secondary | ICD-10-CM | POA: Diagnosis not present

## 2019-10-12 ENCOUNTER — Non-Acute Institutional Stay (SKILLED_NURSING_FACILITY): Payer: Medicare Other | Admitting: Adult Health

## 2019-10-12 DIAGNOSIS — L089 Local infection of the skin and subcutaneous tissue, unspecified: Secondary | ICD-10-CM

## 2019-10-12 DIAGNOSIS — I50811 Acute right heart failure: Secondary | ICD-10-CM

## 2019-10-12 DIAGNOSIS — T148XXA Other injury of unspecified body region, initial encounter: Secondary | ICD-10-CM | POA: Diagnosis not present

## 2019-10-12 NOTE — Progress Notes (Signed)
Location:  Occupational psychologist of Service:  SNF (31) Provider:  Cindi Carbon, ANP [place] [phone]  [name], DO  Patient Care Team: [name], DO as PCP - General (Geriatric Medicine) Sanda Klein, MD as PCP - Cardiology (Cardiology)  Extended Emergency Contact Information Primary Emergency Contact: [name] of [place] Phone: [phone] Work Phone: [phone] Mobile Phone: [phone] Relation: Daughter Secondary Emergency Contact: Nica, Friske Work Phone: [phone] Mobile Phone: [phone] Relation: Son  Code Status:  DNR Goals of care: Advanced Directive information Advanced Directives 07/25/2019  Does Patient Have a Medical Advance Directive? Yes  Type of Advance Directive -  Does patient want to make changes to medical advance directive? No - Patient declined  Copy of [place] in Chart? Yes - validated most recent copy scanned in chart (See row information)  Would patient like information on creating a medical advance directive? -  Pre-existing out of facility DNR order (yellow form or pink MOST form) -     Chief Complaint  Patient presents with  . Acute Visit    abd distention and puffy feet, also check wounds     HPI:  Pt is a 86 y.o. female seen today for an acute visit for abd distention and puffy feet. She has a hx of COPD and cor pulmonale, as well as afib. The nurse reports that over the past few days she has had abd distention, worse today, as well as swelling in the face and feet. She has been progressively gaining weight over the past year as has a large ice cream and boost milkshake daily, however, her weight is down 4 lbs from 1 week ago. She reports that her clothes are tight.  She denies any cough or sob. She is walking less and sitting in the chair more. She has chronic dyspnea on exertion and she could not remember if that was worse with  ambulation. She is moving her bowels well every day and does not have an abd pain or fever. Her HR is controlled with no chest pain or palpitations. She was recently transitioned to Eliquis for CVA risk reduction.   The nurse also reports increased purulent drainage and redness to the skin cancers on both sides of her chest. They are using santyl and foam dressing changes daily. These areas are treated for palliation only due to her goals of care.   Past Medical History:  Diagnosis Date  . Arterial leg ulcer ([place]) 10/14/2018  . Breast cancer ([place])    left   . Centrilobular emphysema ([place]) 12/23/2017  . Chronic anticoagulation   . Chronic atrial fibrillation ([place])   . Chronic respiratory failure with hypoxia ([place]) 03/26/2018  . CKD (chronic kidney disease), stage III   . Cor pulmonale, chronic ([place]) 06/13/2013   Pulmonary artery pressure 72 06/15/17 with severe tricuspid regurg  . Essential hypertension 06/13/2013  . Hypertension   . PAH (pulmonary artery hypertension) ([place]) 03/25/2017   Pulmonary artery pressure 72 06/15/17 with severe tricuspid regurg  . Pelvic fracture ([place]) 05/2016  . Permanent atrial fibrillation ([place]) 03/26/2018   Past Surgical History:  Procedure Laterality Date  . ABDOMINAL HYSTERECTOMY    . BREAST LUMPECTOMY      Allergies  Allergen Reactions  . Aspirin     Upset stomach  . Neosporin [Bacitracin-Polymyxin B]     Unsure of reaction  . Codeine Nausea Only    Nausea  Outpatient Encounter Medications as of 10/12/2019  Medication Sig  . acetaminophen (TYLENOL) 325 MG tablet Take 650 mg by mouth 2 (two) times daily.  Marland Kitchen allopurinol (ZYLOPRIM) 100 MG tablet Take 100 mg by mouth at bedtime.   . ALPRAZolam (XANAX) 0.5 MG tablet Take 1 tablet (0.5 mg total) by mouth at bedtime.  Marland Kitchen antiseptic oral rinse (BIOTENE) LIQD 15 mLs by Mouth Rinse route as needed for dry mouth.  Marland Kitchen apixaban (ELIQUIS) 5 MG TABS tablet Take 1 tablet (5 mg total) by mouth 2 (two) times daily.   . benzonatate (TESSALON) 100 MG capsule Take 100 mg by mouth 3 (three) times daily as needed for cough.  . Calcium Carbonate-Vitamin D3 (CALCIUM 600-D) 600-400 MG-UNIT TABS Take 1 tablet by mouth daily.  . collagenase (SANTYL) ointment Apply 1 application topically every other day.  Marland Kitchen dextromethorphan (DELSYM) 30 MG/5ML liquid Take 5 mLs by mouth every 4 (four) hours as needed for cough.   . diclofenac sodium (VOLTAREN) 1 % GEL Apply 2 g topically 4 (four) times daily. Right shoulder  . Eyelid Cleansers (OCUSOFT EYELID CLEANSING) PADS Apply 1 application topically daily.  . feeding supplement (BOOST HIGH PROTEIN) LIQD Take 1 Container by mouth daily.   Marland Kitchen HYDROcodone-acetaminophen (NORCO) 5-325 MG tablet Take 1 tablet by mouth every 6 (six) hours as needed for severe pain.  Marland Kitchen ipratropium (ATROVENT) 0.02 % nebulizer solution Take 2.5 mLs (0.5 mg total) by nebulization 3 (three) times daily.  Marland Kitchen levalbuterol (XOPENEX) 1.25 MG/0.5ML nebulizer solution Take 1.25 mg by nebulization 3 (three) times daily.  Marland Kitchen levalbuterol (XOPENEX) 1.25 MG/3ML nebulizer solution Take 1.25 mg by nebulization every 6 (six) hours as needed for wheezing or shortness of breath. In addition to three times daily schedule  . loratadine (CLARITIN) 10 MG tablet Take 10 mg by mouth at bedtime.  . metoprolol succinate (TOPROL-XL) 25 MG 24 hr tablet Take 12.5 mg by mouth daily.  . OXYGEN Level 3 or 3 mL/minute when out of room every shift 1, 2, and 3  . Polyethyl Glyc-Propyl Glyc PF (SYSTANE PRESERVATIVE FREE) 0.4-0.3 % SOLN Apply 1 drop to eye 3 (three) times daily as needed (dry eyes). (Patient taking differently: Apply 2 drops to eye in the morning and at bedtime. And tid prn)  . polyethylene glycol (MIRALAX / GLYCOLAX) 17 g packet Take 17 g by mouth daily.   Marland Kitchen spironolactone (ALDACTONE) 25 MG tablet Take 12.5 mg by mouth daily.  Marland Kitchen torsemide (DEMADEX) 20 MG tablet Take 40 mg by mouth daily.   Marland Kitchen triamcinolone cream (KENALOG) 0.1 %  Apply 1 application topically as needed. To right arm for itching   No facility-administered encounter medications on file as of 10/12/2019.    Review of Systems  Constitutional: Positive for activity change. Negative for appetite change, chills, diaphoresis, fatigue and fever.  HENT: Negative for congestion, rhinorrhea, sinus pressure, sinus pain, sneezing and sore throat.   Respiratory: Negative for cough, shortness of breath and wheezing.   Cardiovascular: Positive for leg swelling. Negative for chest pain and palpitations.  Gastrointestinal: Positive for abdominal distention. Negative for abdominal pain, constipation, diarrhea, nausea and vomiting.  Genitourinary: Negative for difficulty urinating and dysuria.  Musculoskeletal: Positive for gait problem. Negative for arthralgias and back pain.  Neurological: Negative for dizziness, tremors, seizures, syncope, facial asymmetry, speech difficulty, weakness, light-headedness, numbness and headaches.  Psychiatric/Behavioral: Negative for agitation, behavioral problems and confusion.       Short term memory loss    Immunization History  Administered Date(s) Administered  . Influenza Split 12/07/2014  . Influenza, High Dose Seasonal PF 12/15/2018  . Influenza-Unspecified 11/05/2013, 12/02/2016, 12/16/2017  . Moderna SARS-COVID-2 Vaccination 03/07/2019, 04/04/2019  . Pneumococcal Polysaccharide-23 03/02/2012  . Tdap 03/02/2012  . Zoster 03/03/2011   Pertinent  Health Maintenance Due  Topic Date Due  . PNA vac Low Risk Adult (2 of 2 - PCV13) 03/02/2013  . INFLUENZA VACCINE  10/01/2019  . DEXA SCAN  Completed   Fall Risk  03/30/2019 01/13/2019  Falls in the past year? 1 -  Number falls in past yr: 1 -  Injury with Fall? 1 -  Risk for fall due to : History of fall(s) History of fall(s);Impaired balance/gait;Impaired mobility;Medication side effect;Mental status change  Follow up Falls evaluation completed Falls evaluation  completed;Education provided;Falls prevention discussed  Comment - done at [facility] as part of care plan   Functional Status Survey:    Vitals:   10/13/19 0910  BP: 91/61  Pulse: 77  Resp: 20  Temp: (!) 97 F (36.1 C)  SpO2: 94%  Weight: 148 lb (67.1 kg)   Body mass index is 25.4 kg/m. Physical Exam Vitals and nursing note reviewed.  Constitutional:      General: She is not in acute distress.    Appearance: She is not diaphoretic.  HENT:     Head: Normocephalic and atraumatic.     Mouth/Throat:     Mouth: Mucous membranes are moist.     Pharynx: Oropharynx is clear. No oropharyngeal exudate.  Neck:     Vascular: No JVD.  Cardiovascular:     Rate and Rhythm: Normal rate. Rhythm irregular.     Heart sounds: No murmur heard.   Pulmonary:     Effort: Pulmonary effort is normal. No respiratory distress.     Breath sounds: Rales present. No wheezing.     Comments: Decreased bases with long exp phase  Abdominal:     General: There is distension.     Palpations: Abdomen is soft. There is no mass.     Tenderness: There is no abdominal tenderness. There is no right CVA tenderness, left CVA tenderness, guarding or rebound.     Hernia: No hernia is present.     Comments: Hypoactive x 4   Musculoskeletal:     Comments: BLE edema +1  Skin:    General: Skin is warm and dry.     Comments: Right chest wall skin cancer wound with hypergranulation tissue wound 100% pink, increasing and spreading erythema to the surrounding area and increased purulent drainage. Left chest wall skin cancer wound with hypergranulation tissue with 100% pink tissue with surrounding erythema and small amt of purulent drainage.   Neurological:     Mental Status: She is alert and oriented to person, place, and time.  Psychiatric:        Mood and Affect: Mood normal.     Labs reviewed: Recent Labs    11/14/18 0000 05/22/19 0000 05/22/19 0600 06/22/19 0000 06/22/19 1250  NA 143 138  --  138  --    K 4.0 4.4  --  3.9  --   CL  --  93*  --  25*  --   CO2  --  30*  --  95*  --   BUN 49* 52*  --  40*  --   CREATININE 1.1 1.2*  --  1.1  --   CALCIUM  --   --  10.8*  --  8.8  Recent Labs    06/22/19 0000 06/22/19 1250  AST 18  --   ALT 8  --   ALBUMIN  --  3.8   Recent Labs    05/22/19 0600 06/22/19 0000 07/13/19 0000  WBC 12.5 21.6 16.4  NEUTROABS  --   --  11  HGB 14.4 15.8 14.7  HCT 43 47* 45  PLT 212 228 256   Lab Results  Component Value Date   TSH 6.46 (A) 05/22/2019   No results found for: HGBA1C No results found for: CHOL, HDL, LDLCALC, LDLDIRECT, TRIG, CHOLHDL  Significant Diagnostic Results in last 30 days:  No results found.  Assessment/Plan 1. Acute right-sided congestive heart failure (HCC) Increase torsemide 40 mg bid x 3 days Kdur 20 meq qd x 3 days Weight daily x 2 weeks F/U labs.   2. Wound infection Keflex 500 mg qid x 7 days Hold Santyl and continue foam dressing changes daily    Family/ staff Communication: discussed with the resident and her nurse Jasmine   Labs/tests ordered:  BMP

## 2019-10-13 ENCOUNTER — Encounter: Payer: Self-pay | Admitting: Adult Health

## 2019-10-13 DIAGNOSIS — Z9189 Other specified personal risk factors, not elsewhere classified: Secondary | ICD-10-CM | POA: Diagnosis not present

## 2019-10-16 DIAGNOSIS — N189 Chronic kidney disease, unspecified: Secondary | ICD-10-CM | POA: Diagnosis not present

## 2019-10-16 DIAGNOSIS — I5032 Chronic diastolic (congestive) heart failure: Secondary | ICD-10-CM | POA: Diagnosis not present

## 2019-10-16 DIAGNOSIS — Z7901 Long term (current) use of anticoagulants: Secondary | ICD-10-CM | POA: Diagnosis not present

## 2019-10-16 LAB — BASIC METABOLIC PANEL
BUN: 13 (ref 4–21)
BUN: 13 (ref 4–21)
CO2: 24 — AB (ref 13–22)
CO2: 24 — AB (ref 13–22)
Chloride: 99 (ref 99–108)
Chloride: 99 (ref 99–108)
Creatinine: 0.7 (ref 0.5–1.1)
Creatinine: 0.7 (ref 0.5–1.1)
Glucose: 108
Glucose: 108
Potassium: 4.4 (ref 3.4–5.3)
Potassium: 4.4 (ref 3.4–5.3)
Sodium: 135 — AB (ref 137–147)
Sodium: 135 — AB (ref 137–147)

## 2019-10-16 LAB — COMPREHENSIVE METABOLIC PANEL
Calcium: 8.8 (ref 8.7–10.7)
Calcium: 8.8 (ref 8.7–10.7)

## 2019-10-16 LAB — POCT INR: INR: 2.3 — AB (ref 0.9–1.1)

## 2019-10-19 ENCOUNTER — Encounter: Payer: Self-pay | Admitting: Internal Medicine

## 2019-10-19 DIAGNOSIS — I4891 Unspecified atrial fibrillation: Secondary | ICD-10-CM | POA: Diagnosis not present

## 2019-10-19 DIAGNOSIS — Z7901 Long term (current) use of anticoagulants: Secondary | ICD-10-CM | POA: Diagnosis not present

## 2019-10-20 DIAGNOSIS — Z20828 Contact with and (suspected) exposure to other viral communicable diseases: Secondary | ICD-10-CM | POA: Diagnosis not present

## 2019-10-20 DIAGNOSIS — Z9189 Other specified personal risk factors, not elsewhere classified: Secondary | ICD-10-CM | POA: Diagnosis not present

## 2019-10-24 ENCOUNTER — Encounter: Payer: Self-pay | Admitting: Internal Medicine

## 2019-10-24 ENCOUNTER — Non-Acute Institutional Stay (SKILLED_NURSING_FACILITY): Payer: Medicare Other | Admitting: Internal Medicine

## 2019-10-24 DIAGNOSIS — I4891 Unspecified atrial fibrillation: Secondary | ICD-10-CM | POA: Diagnosis not present

## 2019-10-24 DIAGNOSIS — I2781 Cor pulmonale (chronic): Secondary | ICD-10-CM | POA: Diagnosis not present

## 2019-10-24 DIAGNOSIS — J9611 Chronic respiratory failure with hypoxia: Secondary | ICD-10-CM

## 2019-10-24 DIAGNOSIS — C761 Malignant neoplasm of thorax: Secondary | ICD-10-CM | POA: Diagnosis not present

## 2019-10-24 DIAGNOSIS — I50811 Acute right heart failure: Secondary | ICD-10-CM

## 2019-10-24 DIAGNOSIS — D6869 Other thrombophilia: Secondary | ICD-10-CM

## 2019-10-24 DIAGNOSIS — J432 Centrilobular emphysema: Secondary | ICD-10-CM

## 2019-10-24 NOTE — Progress Notes (Signed)
Location:  [facility] Room Number: 137 Place of Service:  SNF (31) Provider:  Zakhai Meisinger L. Mariea Clonts, D.O., C.M.D.  [name], DO  Patient Care Team: [name], DO as PCP - General (Geriatric Medicine) Sanda Klein, MD as PCP - Cardiology (Cardiology)  Extended Emergency Contact Information Primary Emergency Contact: [name] of [place] Phone: [phone] Work Phone: [phone] Mobile Phone: [phone] Relation: Daughter Secondary Emergency Contact: Alzena, Gerber Work Phone: [phone] Mobile Phone: [phone] Relation: Son  Code Status:  DNR Goals of care: Advanced Directive information Advanced Directives 10/24/2019  Does Patient Have a Medical Advance Directive? Yes  Type of Advance Directive Out of facility DNR (pink MOST or yellow form)  Does patient want to make changes to medical advance directive? No - Patient declined  Copy of [place] in Chart? -  Would patient like information on creating a medical advance directive? -  Pre-existing out of facility DNR order (yellow form or pink MOST form) Pink MOST/Yellow Form most recent copy in chart - Physician notified to receive inpatient order     Chief Complaint  Patient presents with  . Medical Management of Chronic Issues    routine visit     HPI:  Pt is a 86 y.o. female seen today for medical management of chronic diseases.  She is doing ok.  No shortness of breath or wheezing reported.  Continues on nebs and O2.    In talking with nursing, they note Kennyth Lose is taking a decline recently.  She had an acute chf exacerbation mid month with abdominal distention and puffy feet.  Her swelling got bad enough to involve her face.  She's been gaining weight but had also been having ice cream with boost shakes daily.  Her weight actually declined for the flare but her pants felt tight. She's been walking less and less over months.     Fortunately, she was transitioned off coumadin with all of the abx she receives.  She's on eliquis.  At that same time, there were concerns for infection at the site of skin cancers on her chest being treated palliatively. Her torsemide was doubled for 3 days and potassium added with daily weights for 2 wks.  She was also treated for wound infection with keflex for a week.    Weight is up just over a lb since mid month when she had the chf.  Past Medical History:  Diagnosis Date  . Arterial leg ulcer ([place]) 10/14/2018  . Breast cancer ([place])    left   . Centrilobular emphysema ([place]) 12/23/2017  . Chronic anticoagulation   . Chronic atrial fibrillation ([place])   . Chronic respiratory failure with hypoxia ([place]) 03/26/2018  . CKD (chronic kidney disease), stage III   . Cor pulmonale, chronic ([place]) 06/13/2013   Pulmonary artery pressure 72 06/15/17 with severe tricuspid regurg  . Essential hypertension 06/13/2013  . Hypertension   . PAH (pulmonary artery hypertension) ([place]) 03/25/2017   Pulmonary artery pressure 72 06/15/17 with severe tricuspid regurg  . Pelvic fracture ([place]) 05/2016  . Permanent atrial fibrillation ([place]) 03/26/2018   Past Surgical History:  Procedure Laterality Date  . ABDOMINAL HYSTERECTOMY    . BREAST LUMPECTOMY      Allergies  Allergen Reactions  . Aspirin     Upset stomach  . Neosporin [Bacitracin-Polymyxin B]     Unsure of reaction  . Codeine Nausea Only    Nausea     Outpatient  Encounter Medications as of 10/24/2019  Medication Sig  . acetaminophen (TYLENOL) 325 MG tablet Take 650 mg by mouth 2 (two) times daily.  Marland Kitchen allopurinol (ZYLOPRIM) 100 MG tablet Take 100 mg by mouth at bedtime.   . ALPRAZolam (XANAX) 0.5 MG tablet Take 1 tablet (0.5 mg total) by mouth at bedtime.  Marland Kitchen antiseptic oral rinse (BIOTENE) LIQD 15 mLs by Mouth Rinse route as needed for dry mouth.  Marland Kitchen apixaban (ELIQUIS) 5 MG TABS tablet Take 1 tablet (5 mg total) by mouth 2 (two) times daily.   . Calcium Carbonate-Vitamin D3 (CALCIUM 600-D) 600-400 MG-UNIT TABS Take 1 tablet by mouth daily.  Marland Kitchen dextromethorphan (DELSYM) 30 MG/5ML liquid Take 5 mLs by mouth every 4 (four) hours as needed for cough.   . diclofenac sodium (VOLTAREN) 1 % GEL Apply 2 g topically 4 (four) times daily. Right shoulder  . Eyelid Cleansers (OCUSOFT EYELID CLEANSING) PADS Apply 1 application topically daily.  . feeding supplement (BOOST HIGH PROTEIN) LIQD Take 1 Container by mouth daily.   Marland Kitchen ipratropium (ATROVENT) 0.02 % nebulizer solution Take 2.5 mLs (0.5 mg total) by nebulization 3 (three) times daily.  Marland Kitchen levalbuterol (XOPENEX) 1.25 MG/0.5ML nebulizer solution Take 1.25 mg by nebulization 3 (three) times daily.  Marland Kitchen levalbuterol (XOPENEX) 1.25 MG/3ML nebulizer solution Take 1.25 mg by nebulization every 6 (six) hours as needed for wheezing or shortness of breath. In addition to three times daily schedule  . loratadine (CLARITIN) 10 MG tablet Take 10 mg by mouth at bedtime.  . metoprolol succinate (TOPROL-XL) 25 MG 24 hr tablet Take 12.5 mg by mouth daily.  . OXYGEN Level 3 or 3 mL/minute when out of room every shift 1, 2, and 3  . Polyethyl Glyc-Propyl Glyc PF (SYSTANE PRESERVATIVE FREE) 0.4-0.3 % SOLN Apply 1 drop to eye 3 (three) times daily as needed (dry eyes).  . polyethylene glycol (MIRALAX / GLYCOLAX) 17 g packet Take 17 g by mouth daily.   Marland Kitchen spironolactone (ALDACTONE) 25 MG tablet Take 12.5 mg by mouth daily.  Marland Kitchen torsemide (DEMADEX) 20 MG tablet Take 40 mg by mouth daily.   Marland Kitchen triamcinolone cream (KENALOG) 0.1 % Apply 1 application topically as needed. To right arm for itching  . valACYclovir (VALTREX) 1000 MG tablet Take 1,000 mg by mouth 2 (two) times daily.  . [DISCONTINUED] collagenase (SANTYL) ointment Apply 1 application topically every other day.  Marland Kitchen HYDROcodone-acetaminophen (NORCO) 5-325 MG tablet Take 1 tablet by mouth every 6 (six) hours as needed for severe pain.  . [DISCONTINUED] benzonatate  (TESSALON) 100 MG capsule Take 100 mg by mouth 3 (three) times daily as needed for cough.   No facility-administered encounter medications on file as of 10/24/2019.    Review of Systems  Constitutional: Positive for malaise/fatigue. Negative for chills and fever.  HENT: Negative for sore throat.   Eyes: Negative for blurred vision.  Respiratory: Negative for cough and shortness of breath.   Cardiovascular: Negative for chest pain, palpitations and leg swelling.       Swelling better  Gastrointestinal: Negative for abdominal pain and constipation.  Genitourinary: Negative for dysuria.  Musculoskeletal: Negative for falls.  Skin: Negative for itching and rash.       Pain on chest wall, arm, leg  Neurological: Negative for dizziness and loss of consciousness.  Psychiatric/Behavioral: Negative for depression. The patient is not nervous/anxious.     Immunization History  Administered Date(s) Administered  . Influenza Split 12/07/2014  . Influenza, High Dose Seasonal PF  12/15/2018  . Influenza-Unspecified 11/05/2013, 12/02/2016, 12/16/2017  . Moderna SARS-COVID-2 Vaccination 03/07/2019, 04/04/2019  . Pneumococcal Polysaccharide-23 03/02/2012  . Tdap 03/02/2012  . Zoster 03/03/2011   Pertinent  Health Maintenance Due  Topic Date Due  . PNA vac Low Risk Adult (2 of 2 - PCV13) 03/02/2013  . INFLUENZA VACCINE  10/01/2019  . DEXA SCAN  Completed   Fall Risk  03/30/2019 01/13/2019  Falls in the past year? 1 -  Number falls in past yr: 1 -  Injury with Fall? 1 -  Risk for fall due to : History of fall(s) History of fall(s);Impaired balance/gait;Impaired mobility;Medication side effect;Mental status change  Follow up Falls evaluation completed Falls evaluation completed;Education provided;Falls prevention discussed  Comment - done at [facility] as part of care plan   Functional Status Survey:    Vitals:   10/24/19 1428  BP: (!) 93/59  Pulse: 82  Temp: (!) 97 F (36.1 C)  SpO2:  95%  Weight: 149 lb 9.8 oz (67.9 kg)  Height: _0  (1.626 m)   Body mass index is 25.68 kg/m. Physical Exam Vitals reviewed.  Constitutional:      General: She is not in acute distress.    Appearance: Normal appearance. She is ill-appearing.  HENT:     Head: Normocephalic and atraumatic.  Cardiovascular:     Rate and Rhythm: Rhythm irregular.     Heart sounds: No murmur heard.   Pulmonary:     Effort: Pulmonary effort is normal.     Breath sounds: Normal breath sounds.  Abdominal:     General: Bowel sounds are normal.  Skin:    Comments: Dressings covering chest wounds, leg wound  Neurological:     General: No focal deficit present.     Mental Status: She is alert and oriented to person, place, and time.     Gait: Gait abnormal.     Comments: Uses wheelchair but mostly stays in recliner or bed  Psychiatric:        Mood and Affect: Mood normal.        Behavior: Behavior normal.     Labs reviewed: Recent Labs    05/22/19 0000 05/22/19 0600 06/22/19 0000 06/22/19 1250 10/16/19 0000  NA 138  --  138  --  135*  135*  K 4.4  --  3.9  --  4.4  4.4  CL 93*  --  25*  --  99  99  CO2 30*  --  95*  --  24*  24*  BUN 52*  --  40*  --  13  13  CREATININE 1.2*  --  1.1  --  0.7  0.7  CALCIUM  --  10.8*  --  8.8 8.8  8.8   Recent Labs    06/22/19 0000 06/22/19 1250  AST 18  --   ALT 8  --   ALBUMIN  --  3.8   Recent Labs    05/22/19 0600 06/22/19 0000 07/13/19 0000  WBC 12.5 21.6 16.4  NEUTROABS  --   --  11  HGB 14.4 15.8 14.7  HCT 43 47* 45  PLT 212 228 256   Lab Results  Component Value Date   TSH 6.46 (A) 05/22/2019   No results found for: HGBA1C No results found for: CHOL, HDL, LDLCALC, LDLDIRECT, TRIG, CHOLHDL  Significant Diagnostic Results in last 30 days:  No results found.  Assessment/Plan 1. Acute right-sided congestive heart failure (HCC) -just over exacerbation from mid  month, still weak   2. 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 Hypercoagulable state due to atrial fibrillation (HCC) -cont eliquis in place of warfarin  4. Centrilobular emphysema (HCC) -cont nebs and oxygen   5. Chronic respiratory failure with hypoxia (HCC) -cont O2 via [place]  6. Cor pulmonale, chronic (HCC) -had flare mid-month, recovering, weight stable, less abdominal swelling and no edema of feet now  Family/ staff Communication: discussed with snf nurse  Labs/tests ordered:  No new  Joane Postel L. Algis Lehenbauer, D.O. [facility] [address], [place] [address] Cell Phone (Mon-Fri 8am-5pm):  [phone] On Call:  [phone] & follow prompts after 5pm & weekends Office Phone:  [phone] Office Fax:  [phone]

## 2019-10-28 ENCOUNTER — Telehealth: Payer: Self-pay | Admitting: Adult Health

## 2019-10-28 DIAGNOSIS — T148XXA Other injury of unspecified body region, initial encounter: Secondary | ICD-10-CM

## 2019-10-28 NOTE — Telephone Encounter (Signed)
Nurse Autumn called to report that [name] has a hematoma to her foot first noticed on 8/27.  There is no known injury to the foot. The hematoma is increasing in size, now the size of a "plum" per nurse. It is not tender and she does not have any change in her range of motion or functional status. She is on Eliquis for CVA risk reduction. They are wrapping her foot at this time. They report her weight and vitals are stable. I instructed the nurse to continue to monitor the size of the leg, as well as for signs of loss of function, numbness, increase pain, etc and to report any changes of condition to [facility].  She verbalized understanding.

## 2019-11-03 ENCOUNTER — Other Ambulatory Visit: Payer: Self-pay | Admitting: Internal Medicine

## 2019-11-03 ENCOUNTER — Encounter: Payer: Self-pay | Admitting: Adult Health

## 2019-11-03 ENCOUNTER — Non-Acute Institutional Stay (SKILLED_NURSING_FACILITY): Payer: Medicare Other | Admitting: Adult Health

## 2019-11-03 DIAGNOSIS — F419 Anxiety disorder, unspecified: Secondary | ICD-10-CM

## 2019-11-03 DIAGNOSIS — R627 Adult failure to thrive: Secondary | ICD-10-CM | POA: Diagnosis not present

## 2019-11-03 DIAGNOSIS — J9611 Chronic respiratory failure with hypoxia: Secondary | ICD-10-CM

## 2019-11-03 DIAGNOSIS — W19XXXA Unspecified fall, initial encounter: Secondary | ICD-10-CM

## 2019-11-03 DIAGNOSIS — I2781 Cor pulmonale (chronic): Secondary | ICD-10-CM

## 2019-11-03 DIAGNOSIS — T148XXA Other injury of unspecified body region, initial encounter: Secondary | ICD-10-CM | POA: Diagnosis not present

## 2019-11-03 MED ORDER — ALPRAZOLAM 0.5 MG PO TABS: 0.5000 mg | ORAL_TABLET | Freq: Every day | ORAL | 5 refills | Status: AC

## 2019-11-03 NOTE — Progress Notes (Signed)
Location:  Occupational psychologist of Service:  SNF (31) Provider:   Cindi Carbon, ANP [place] [phone]  [name], DO  Patient Care Team: [name], DO as PCP - General (Geriatric Medicine) Sanda Klein, MD as PCP - Cardiology (Cardiology)  Extended Emergency Contact Information Primary Emergency Contact: [name] of [place] Phone: [phone] Work Phone: [phone] Mobile Phone: [phone] Relation: Daughter Secondary Emergency Contact: Garza, Jessica Work Phone: [phone] Mobile Phone: [phone] Relation: Son  Code Status:  DNR Goals of care: Advanced Directive information Advanced Directives 10/24/2019  Does Patient Have a Medical Advance Directive? Yes  Type of Advance Directive Out of facility DNR (pink MOST or yellow form)  Does patient want to make changes to medical advance directive? No - Patient declined  Copy of [place] in Chart? -  Would patient like information on creating a medical advance directive? -  Pre-existing out of facility DNR order (yellow form or pink MOST form) Pink MOST/Yellow Form most recent copy in chart - Physician notified to receive inpatient order     Chief Complaint  Patient presents with  . Acute Visit    fall    HPI:  Pt is a 86 y.o. female seen today for an acute visit for a fall. I was walking down the hall and heard someone yelling. I reached [name]'s room and found her on the floor, face up, alert, and yelling that she was in pain. She was lying flat on top of the metal bar where the bedside table is located. She appeared to have slid out of her chair.  She denies any LOC or head injury. Once she was hoyer lifted to the bed she no longer felt any pain. On 8/27 she was noted to have a hematoma to the top of her left foot. The area initially increased in size but is now decreasing in size. It remains very tender. She does not  know how she injured it. She is sleeping more now and more confused. She has had a functional decline and is now using a hoyer lift for tranfers. She is using norco for pain with dressing changes to the skin cancer lesions she has that are increasing in size (she opted not treat them).  Her vitals were WNL except her 02 sats were 88-89% on 3 liters. She is not having any increase in WOB.  She had been gaining weight and having increased edema but now her edema is reducing and she is down 2 lbs in the past week.    Past Medical History:  Diagnosis Date  . Arterial leg ulcer ([place]) 10/14/2018  . Breast cancer ([place])    left   . Centrilobular emphysema ([place]) 12/23/2017  . Chronic anticoagulation   . Chronic atrial fibrillation ([place])   . Chronic respiratory failure with hypoxia ([place]) 03/26/2018  . CKD (chronic kidney disease), stage III   . Cor pulmonale, chronic ([place]) 06/13/2013   Pulmonary artery pressure 72 06/15/17 with severe tricuspid regurg  . Essential hypertension 06/13/2013  . Hypertension   . PAH (pulmonary artery hypertension) ([place]) 03/25/2017   Pulmonary artery pressure 72 06/15/17 with severe tricuspid regurg  . Pelvic fracture ([place]) 05/2016  . Permanent atrial fibrillation ([place]) 03/26/2018   Past Surgical History:  Procedure Laterality Date  . ABDOMINAL HYSTERECTOMY    . BREAST LUMPECTOMY      Allergies  Allergen Reactions  . Aspirin  Upset stomach  . Neosporin [Bacitracin-Polymyxin B]     Unsure of reaction  . Codeine Nausea Only    Nausea     Outpatient Encounter Medications as of 11/03/2019  Medication Sig  . acetaminophen (TYLENOL) 325 MG tablet Take 650 mg by mouth 2 (two) times daily.  Marland Kitchen allopurinol (ZYLOPRIM) 100 MG tablet Take 100 mg by mouth at bedtime.   . ALPRAZolam (XANAX) 0.5 MG tablet Take 1 tablet (0.5 mg total) by mouth at bedtime.  Marland Kitchen antiseptic oral rinse (BIOTENE) LIQD 15 mLs by Mouth Rinse route as needed for dry mouth.  Marland Kitchen apixaban (ELIQUIS) 5 MG TABS  tablet Take 1 tablet (5 mg total) by mouth 2 (two) times daily.  . Calcium Carbonate-Vitamin D3 (CALCIUM 600-D) 600-400 MG-UNIT TABS Take 1 tablet by mouth daily.  Marland Kitchen dextromethorphan (DELSYM) 30 MG/5ML liquid Take 5 mLs by mouth every 4 (four) hours as needed for cough.   . diclofenac sodium (VOLTAREN) 1 % GEL Apply 2 g topically 4 (four) times daily. Right shoulder  . Eyelid Cleansers (OCUSOFT EYELID CLEANSING) PADS Apply 1 application topically daily.  . feeding supplement (BOOST HIGH PROTEIN) LIQD Take 1 Container by mouth daily.   Marland Kitchen HYDROcodone-acetaminophen (NORCO) 5-325 MG tablet Take 1 tablet by mouth every 6 (six) hours as needed for severe pain.  Marland Kitchen ipratropium (ATROVENT) 0.02 % nebulizer solution Take 2.5 mLs (0.5 mg total) by nebulization 3 (three) times daily.  Marland Kitchen levalbuterol (XOPENEX) 1.25 MG/0.5ML nebulizer solution Take 1.25 mg by nebulization 3 (three) times daily.  Marland Kitchen levalbuterol (XOPENEX) 1.25 MG/3ML nebulizer solution Take 1.25 mg by nebulization every 6 (six) hours as needed for wheezing or shortness of breath. In addition to three times daily schedule  . loratadine (CLARITIN) 10 MG tablet Take 10 mg by mouth at bedtime.  . metoprolol succinate (TOPROL-XL) 25 MG 24 hr tablet Take 12.5 mg by mouth daily.  . OXYGEN Level 3 or 3 mL/minute when out of room every shift 1, 2, and 3  . Polyethyl Glyc-Propyl Glyc PF (SYSTANE PRESERVATIVE FREE) 0.4-0.3 % SOLN Apply 1 drop to eye 3 (three) times daily as needed (dry eyes).  . polyethylene glycol (MIRALAX / GLYCOLAX) 17 g packet Take 17 g by mouth daily.   Marland Kitchen spironolactone (ALDACTONE) 25 MG tablet Take 12.5 mg by mouth daily.  Marland Kitchen torsemide (DEMADEX) 20 MG tablet Take 40 mg by mouth daily.   Marland Kitchen triamcinolone cream (KENALOG) 0.1 % Apply 1 application topically as needed. To right arm for itching  . valACYclovir (VALTREX) 1000 MG tablet Take 1,000 mg by mouth 2 (two) times daily.   No facility-administered encounter medications on file as of  11/03/2019.    Review of Systems  Constitutional: Positive for activity change and fatigue. Negative for appetite change, chills, diaphoresis, fever and unexpected weight change.  HENT: Negative for congestion.   Respiratory: Negative for cough, shortness of breath and wheezing.   Cardiovascular: Positive for leg swelling. Negative for chest pain and palpitations.  Gastrointestinal: Negative for abdominal distention, abdominal pain, constipation and diarrhea.  Genitourinary: Negative for difficulty urinating and dysuria.  Musculoskeletal: Positive for gait problem and joint swelling (left foot). Negative for arthralgias, back pain and myalgias.  Skin: Positive for color change (hematoma) and wound (right forearm and chest wall).  Neurological: Negative for dizziness, tremors, seizures, syncope, facial asymmetry, speech difficulty, weakness (generalized), light-headedness, numbness and headaches.  Psychiatric/Behavioral: Positive for confusion. Negative for agitation and behavioral problems.    Immunization History  Administered  Date(s) Administered  . Influenza Split 12/07/2014  . Influenza, High Dose Seasonal PF 12/15/2018  . Influenza-Unspecified 11/05/2013, 12/02/2016, 12/16/2017  . Moderna SARS-COVID-2 Vaccination 03/07/2019, 04/04/2019  . Pneumococcal Polysaccharide-23 03/02/2012  . Tdap 03/02/2012  . Zoster 03/03/2011   Pertinent  Health Maintenance Due  Topic Date Due  . PNA vac Low Risk Adult (2 of 2 - PCV13) 03/02/2013  . INFLUENZA VACCINE  10/01/2019  . DEXA SCAN  Completed   Fall Risk  03/30/2019 01/13/2019  Falls in the past year? 1 -  Number falls in past yr: 1 -  Injury with Fall? 1 -  Risk for fall due to : History of fall(s) History of fall(s);Impaired balance/gait;Impaired mobility;Medication side effect;Mental status change  Follow up Falls evaluation completed Falls evaluation completed;Education provided;Falls prevention discussed  Comment - done at [facility]  as part of care plan   Functional Status Survey:    Vitals:   11/03/19 1110  BP: (!) 111/57  Pulse: 70  Resp: (!) 22  SpO2: (!) 89%  Weight: 147 lb (66.7 kg)   Body mass index is 25.23 kg/m.  Wt Readings from Last 3 Encounters:  11/03/19 147 lb (66.7 kg)  10/24/19 149 lb 9.8 oz (67.9 kg)  10/13/19 148 lb (67.1 kg)    Physical Exam Vitals and nursing note reviewed.  Constitutional:      General: She is in acute distress.  HENT:     Nose: Nose normal. No congestion.     Mouth/Throat:     Mouth: Mucous membranes are moist.     Pharynx: Oropharynx is clear. No oropharyngeal exudate.  Cardiovascular:     Rate and Rhythm: Normal rate. Rhythm irregular.  Pulmonary:     Effort: Pulmonary effort is normal.     Breath sounds: No wheezing.     Comments: Decreased bases with long exp phase Abdominal:     General: Bowel sounds are normal. There is distension (mild, not firm, rotund).     Palpations: Abdomen is soft.  Musculoskeletal:     Comments: BLE edema trace. No pain with ROM to either shoulder, elbow, hip, or knee. Very tender left foot not able to examine with ROM.   Skin:    General: Skin is warm and dry.     Comments: Dressing CDI to right forearm and bilateral chest wall. Left foot with hematoma decreasing in size when compared to picture and markings.   Neurological:     General: No focal deficit present.     Mental Status: She is alert.     Comments: Alert to self and place but not time or location. Falls asleep while talking to me  Psychiatric:        Mood and Affect: Mood normal.     Labs reviewed: Recent Labs    05/22/19 0000 05/22/19 0600 06/22/19 0000 06/22/19 1250 10/16/19 0000  NA 138  --  138  --  135*  135*  K 4.4  --  3.9  --  4.4  4.4  CL 93*  --  25*  --  99  99  CO2 30*  --  95*  --  24*  24*  BUN 52*  --  40*  --  13  13  CREATININE 1.2*  --  1.1  --  0.7  0.7  CALCIUM  --  10.8*  --  8.8 8.8  8.8   Recent Labs     06/22/19 0000 06/22/19 1250  AST 18  --  ALT 8  --   ALBUMIN  --  3.8   Recent Labs    05/22/19 0600 06/22/19 0000 07/13/19 0000  WBC 12.5 21.6 16.4  NEUTROABS  --   --  11  HGB 14.4 15.8 14.7  HCT 43 47* 45  PLT 212 228 256   Lab Results  Component Value Date   TSH 6.46 (A) 05/22/2019   No results found for: HGBA1C No results found for: CHOL, HDL, LDLCALC, LDLDIRECT, TRIG, CHOLHDL  Significant Diagnostic Results in last 30 days:  No results found.  Assessment/Plan 1. Fall, initial encounter Slid from recliner.  Recommend anti glide mat to recliner.  Monitor for any pain or discomfort and report to [facility] for additional xray orders.   2. Hematoma to left foot Improving, unclear etiology Keep covered and elevated. Change dressing on bath days.   3. FTT (failure to thrive) in adult I spoke with her daughter and she is aware that Ms. Matheny is declining in cognition and physical function. We agreed that her goals of care are comfort based. She has a most form that indicates no hospitalizations. We reviewed this and Deedee expressed that she only wanted her to not suffer any longer. I let her know we will monitor her and if she continues to decline we will review her medications to discontinue any that are no longer needed. Recommend giving the norco prior to any dressing changes to avoid pain  4. Cor pulmonale Reduced weight and leg edema and change weights to weekly  Continue torsemide 40 mg qd and Aldactone 12.5 mg qd   5. Chronic resp failure Continues on oxygen at 3 liters, would not titrate upward unless she is SOB due to her goals of care. Recommend staff avoid exertion and keep her head elevated at all times.     Family/ staff Communication: discussed with her daughter Donavan Burnet  Labs/tests ordered:  NA

## 2019-11-05 ENCOUNTER — Telehealth: Payer: Self-pay | Admitting: Family

## 2019-11-05 NOTE — Telephone Encounter (Addendum)
Facility Nurse from [employer] called request patient Norco to be scheduled for better pain control of patient's left foot hematoma.States has had hematoma since 10/27/2019 unclear cause though also fell recently and seems to have worsen left foot hematoma.Also states family asking if X-ray is done will patient be send to ED is fracture is noted? POA would like to focus on comfort care for now.Option also gien for Orthopedic if X-ray desired.Nurse will notify POA.Norco changed to every 8 hrs and discontinue Norco 5-325 mg tablet every 4 Hrs PRN.Advised to monitor for sedation.

## 2019-11-07 NOTE — Telephone Encounter (Signed)
Goals are comfort-based.  All imaging and pain mgt to be managed in house at [facility].  Thanks.

## 2019-11-10 ENCOUNTER — Other Ambulatory Visit: Payer: Self-pay | Admitting: Adult Health

## 2019-11-10 DIAGNOSIS — L97909 Non-pressure chronic ulcer of unspecified part of unspecified lower leg with unspecified severity: Secondary | ICD-10-CM

## 2019-11-10 MED ORDER — HYDROCODONE-ACETAMINOPHEN 5-325 MG PO TABS
1.0000 | ORAL_TABLET | Freq: Three times a day (TID) | ORAL | 0 refills | Status: DC
Start: 2019-11-10 — End: 2019-12-11

## 2019-11-10 MED ORDER — HYDROCODONE-ACETAMINOPHEN 5-325 MG PO TABS
1.0000 | ORAL_TABLET | ORAL | 0 refills | Status: AC | PRN
Start: 2019-11-10 — End: 2020-11-09

## 2019-11-12 DIAGNOSIS — I482 Chronic atrial fibrillation, unspecified: Secondary | ICD-10-CM | POA: Diagnosis not present

## 2019-11-12 DIAGNOSIS — C44602 Unspecified malignant neoplasm of skin of right upper limb, including shoulder: Secondary | ICD-10-CM | POA: Diagnosis not present

## 2019-11-12 DIAGNOSIS — C44509 Unspecified malignant neoplasm of skin of other part of trunk: Secondary | ICD-10-CM | POA: Diagnosis not present

## 2019-11-12 DIAGNOSIS — M109 Gout, unspecified: Secondary | ICD-10-CM | POA: Diagnosis not present

## 2019-11-12 DIAGNOSIS — I129 Hypertensive chronic kidney disease with stage 1 through stage 4 chronic kidney disease, or unspecified chronic kidney disease: Secondary | ICD-10-CM | POA: Diagnosis not present

## 2019-11-12 DIAGNOSIS — G9340 Encephalopathy, unspecified: Secondary | ICD-10-CM | POA: Diagnosis not present

## 2019-11-12 DIAGNOSIS — J9610 Chronic respiratory failure, unspecified whether with hypoxia or hypercapnia: Secondary | ICD-10-CM | POA: Diagnosis not present

## 2019-11-12 DIAGNOSIS — J449 Chronic obstructive pulmonary disease, unspecified: Secondary | ICD-10-CM | POA: Diagnosis not present

## 2019-11-12 DIAGNOSIS — I272 Pulmonary hypertension, unspecified: Secondary | ICD-10-CM | POA: Diagnosis not present

## 2019-11-12 DIAGNOSIS — L089 Local infection of the skin and subcutaneous tissue, unspecified: Secondary | ICD-10-CM | POA: Diagnosis not present

## 2019-11-12 DIAGNOSIS — I2781 Cor pulmonale (chronic): Secondary | ICD-10-CM | POA: Diagnosis not present

## 2019-11-12 DIAGNOSIS — C761 Malignant neoplasm of thorax: Secondary | ICD-10-CM | POA: Diagnosis not present

## 2019-11-12 DIAGNOSIS — N183 Chronic kidney disease, stage 3 unspecified: Secondary | ICD-10-CM | POA: Diagnosis not present

## 2019-11-12 DIAGNOSIS — C44702 Unspecified malignant neoplasm of skin of right lower limb, including hip: Secondary | ICD-10-CM | POA: Diagnosis not present

## 2019-11-13 DIAGNOSIS — C44509 Unspecified malignant neoplasm of skin of other part of trunk: Secondary | ICD-10-CM | POA: Diagnosis not present

## 2019-11-13 DIAGNOSIS — G9340 Encephalopathy, unspecified: Secondary | ICD-10-CM | POA: Diagnosis not present

## 2019-11-13 DIAGNOSIS — L089 Local infection of the skin and subcutaneous tissue, unspecified: Secondary | ICD-10-CM | POA: Diagnosis not present

## 2019-11-13 DIAGNOSIS — C44702 Unspecified malignant neoplasm of skin of right lower limb, including hip: Secondary | ICD-10-CM | POA: Diagnosis not present

## 2019-11-13 DIAGNOSIS — C761 Malignant neoplasm of thorax: Secondary | ICD-10-CM | POA: Diagnosis not present

## 2019-11-13 DIAGNOSIS — C44602 Unspecified malignant neoplasm of skin of right upper limb, including shoulder: Secondary | ICD-10-CM | POA: Diagnosis not present

## 2019-11-14 DIAGNOSIS — C44602 Unspecified malignant neoplasm of skin of right upper limb, including shoulder: Secondary | ICD-10-CM | POA: Diagnosis not present

## 2019-11-14 DIAGNOSIS — L089 Local infection of the skin and subcutaneous tissue, unspecified: Secondary | ICD-10-CM | POA: Diagnosis not present

## 2019-11-14 DIAGNOSIS — C761 Malignant neoplasm of thorax: Secondary | ICD-10-CM | POA: Diagnosis not present

## 2019-11-14 DIAGNOSIS — C44509 Unspecified malignant neoplasm of skin of other part of trunk: Secondary | ICD-10-CM | POA: Diagnosis not present

## 2019-11-14 DIAGNOSIS — G9340 Encephalopathy, unspecified: Secondary | ICD-10-CM | POA: Diagnosis not present

## 2019-11-14 DIAGNOSIS — C44702 Unspecified malignant neoplasm of skin of right lower limb, including hip: Secondary | ICD-10-CM | POA: Diagnosis not present

## 2019-11-21 DIAGNOSIS — C761 Malignant neoplasm of thorax: Secondary | ICD-10-CM | POA: Diagnosis not present

## 2019-11-21 DIAGNOSIS — C44602 Unspecified malignant neoplasm of skin of right upper limb, including shoulder: Secondary | ICD-10-CM | POA: Diagnosis not present

## 2019-11-21 DIAGNOSIS — C44702 Unspecified malignant neoplasm of skin of right lower limb, including hip: Secondary | ICD-10-CM | POA: Diagnosis not present

## 2019-11-21 DIAGNOSIS — G9340 Encephalopathy, unspecified: Secondary | ICD-10-CM | POA: Diagnosis not present

## 2019-11-21 DIAGNOSIS — L089 Local infection of the skin and subcutaneous tissue, unspecified: Secondary | ICD-10-CM | POA: Diagnosis not present

## 2019-11-21 DIAGNOSIS — C44509 Unspecified malignant neoplasm of skin of other part of trunk: Secondary | ICD-10-CM | POA: Diagnosis not present

## 2019-11-22 DIAGNOSIS — R799 Abnormal finding of blood chemistry, unspecified: Secondary | ICD-10-CM | POA: Diagnosis not present

## 2019-11-22 DIAGNOSIS — Z20828 Contact with and (suspected) exposure to other viral communicable diseases: Secondary | ICD-10-CM | POA: Diagnosis not present

## 2019-11-22 DIAGNOSIS — Z9089 Acquired absence of other organs: Secondary | ICD-10-CM | POA: Diagnosis not present

## 2019-11-23 ENCOUNTER — Telehealth: Payer: Self-pay | Admitting: Internal Medicine

## 2019-11-23 ENCOUNTER — Encounter: Payer: Self-pay | Admitting: Adult Health

## 2019-11-23 ENCOUNTER — Non-Acute Institutional Stay (SKILLED_NURSING_FACILITY): Payer: Medicare Other | Admitting: Adult Health

## 2019-11-23 DIAGNOSIS — R627 Adult failure to thrive: Secondary | ICD-10-CM | POA: Diagnosis not present

## 2019-11-23 DIAGNOSIS — I2781 Cor pulmonale (chronic): Secondary | ICD-10-CM | POA: Diagnosis not present

## 2019-11-23 DIAGNOSIS — J9611 Chronic respiratory failure with hypoxia: Secondary | ICD-10-CM

## 2019-11-23 DIAGNOSIS — Z86007 Personal history of in-situ neoplasm of skin: Secondary | ICD-10-CM | POA: Diagnosis not present

## 2019-11-23 DIAGNOSIS — C761 Malignant neoplasm of thorax: Secondary | ICD-10-CM | POA: Diagnosis not present

## 2019-11-23 DIAGNOSIS — J432 Centrilobular emphysema: Secondary | ICD-10-CM | POA: Diagnosis not present

## 2019-11-23 NOTE — Progress Notes (Signed)
Location:  Occupational psychologist of Service:  SNF (31) Provider:   Cindi Carbon, ANP [place] [phone]   [name], DO  Patient Care Team: [name], DO as PCP - General (Geriatric Medicine) Sanda Klein, MD as PCP - Cardiology (Cardiology)  Extended Emergency Contact Information Primary Emergency Contact: [name] of [place] Phone: [phone] Work Phone: [phone] Mobile Phone: [phone] Relation: Daughter Secondary Emergency Contact: Arkie, [place] Work Phone: [phone] Mobile Phone: [phone] Relation: Son  Code Status:  DNR Goals of care: Advanced Directive information Advanced Directives 10/24/2019  Does Patient Have a Medical Advance Directive? Yes  Type of Advance Directive Out of facility DNR (pink MOST or yellow form)  Does patient want to make changes to medical advance directive? No - Patient declined  Copy of [place] in Chart? -  Would patient like information on creating a medical advance directive? -  Pre-existing out of facility DNR order (yellow form or pink MOST form) Pink MOST/Yellow Form most recent copy in chart - Physician notified to receive inpatient order     Chief Complaint  Patient presents with   Medical Management of Chronic Issues    HPI:  Pt is a 86 y.o. female seen today for medical management of chronic diseases.  Ms. Umholtz is now followed by hospice due to progressive weakness, skin cancer to the chest wall and right forearm, PAH, COPD, oxygen dependency, falls, left foot hematoma, CKD, HTN, and afib.   She is on norco TID for pain to the chest wall and right forearm. The nurse reports her pain is well controlled. She also uses morphine prior to dressing changes  She is eating and drinking periodically but overall intake is down. No longer taking boost.   Reports mild increased work of breathing, chronically on oxygne 2-3  liters. Dry cough unchanged. Reports no chest pain or discomfort.   Pt has a left foot hematoma with unclear etiology. No longer hurts. Pt uses bed cradle to prevent pain or friction.    Past Medical History:  Diagnosis Date   Arterial leg ulcer ([place]) 10/14/2018   Breast cancer ([place])    left    Centrilobular emphysema ([place]) 12/23/2017   Chronic anticoagulation    Chronic atrial fibrillation ([facility])    Chronic respiratory failure with hypoxia ([place]) 03/26/2018   CKD (chronic kidney disease), stage III    Cor pulmonale, chronic (H[facility]) 06/13/2013   Pulmonary artery pressure 72 06/15/17 with severe tricuspid regurg   Essential hypertension 06/13/2013   Hypertension    PAH (pulmonary artery hypertension) ([place]) 03/25/2017   Pulmonary artery pressure 72 06/15/17 with severe tricuspid regurg   Pelvic fracture ([place]) 05/2016   Permanent atrial fibrillation ([place]) 03/26/2018   Past Surgical History:  Procedure Laterality Date   ABDOMINAL HYSTERECTOMY     BREAST LUMPECTOMY      Allergies  Allergen Reactions   Aspirin     Upset stomach   Neosporin [Bacitracin-Polymyxin B]     Unsure of reaction   Codeine Nausea Only    Nausea     Outpatient Encounter Medications as of 11/23/2019  Medication Sig   Eyelid Cleansers (OCUSOFT EYELID CLEANSING) PADS Apply 1 application topically daily.   HYDROcodone-acetaminophen (NORCO) 5-325 MG tablet Take 1 tablet by mouth every 8 (eight) hours.   HYDROcodone-acetaminophen (NORCO/VICODIN) 5-325 MG tablet Take 1 tablet by mouth every 4 (four) hours as needed for moderate pain.   levalbuterol (  XOPENEX) 1.25 MG/0.5ML nebulizer solution Take 1.25 mg by nebulization 3 (three) times daily.   levalbuterol (XOPENEX) 1.25 MG/3ML nebulizer solution Take 1.25 mg by nebulization every 6 (six) hours as needed for wheezing or shortness of breath. In addition to three times daily schedule   metoprolol succinate (TOPROL-XL) 25 MG 24 hr tablet Take 12.5 mg  by mouth daily.   morphine (ROXANOL) 20 MG/ML concentrated solution Take 5 mg by mouth every 4 (four) hours as needed for severe pain.   morphine (ROXANOL) 20 MG/ML concentrated solution Take 10 mg by mouth daily as needed for moderate pain or severe pain. Prior to dressing change   OXYGEN Level 3 or 3 mL/minute when out of room every shift 1, 2, and 3   Polyethyl Glyc-Propyl Glyc PF (SYSTANE PRESERVATIVE FREE) 0.4-0.3 % SOLN Apply 1 drop to eye 3 (three) times daily as needed (dry eyes).   polyethylene glycol (MIRALAX / GLYCOLAX) 17 g packet Take 17 g by mouth daily.    torsemide (DEMADEX) 20 MG tablet Take 40 mg by mouth daily.    [DISCONTINUED] spironolactone (ALDACTONE) 25 MG tablet Take 12.5 mg by mouth daily.   ALPRAZolam (XANAX) 0.5 MG tablet Take 1 tablet (0.5 mg total) by mouth at bedtime.   antiseptic oral rinse (BIOTENE) LIQD 15 mLs by Mouth Rinse route as needed for dry mouth.   apixaban (ELIQUIS) 5 MG TABS tablet Take 1 tablet (5 mg total) by mouth 2 (two) times daily.   dextromethorphan (DELSYM) 30 MG/5ML liquid Take 5 mLs by mouth every 4 (four) hours as needed for cough.    ipratropium (ATROVENT) 0.02 % nebulizer solution Take 2.5 mLs (0.5 mg total) by nebulization 3 (three) times daily.   triamcinolone cream (KENALOG) 0.1 % Apply 1 application topically as needed. To right arm for itching   [DISCONTINUED] acetaminophen (TYLENOL) 325 MG tablet Take 650 mg by mouth 2 (two) times daily.   [DISCONTINUED] allopurinol (ZYLOPRIM) 100 MG tablet Take 100 mg by mouth at bedtime.    [DISCONTINUED] Calcium Carbonate-Vitamin D3 (CALCIUM 600-D) 600-400 MG-UNIT TABS Take 1 tablet by mouth daily.   [DISCONTINUED] diclofenac sodium (VOLTAREN) 1 % GEL Apply 2 g topically 4 (four) times daily. Right shoulder   [DISCONTINUED] feeding supplement (BOOST HIGH PROTEIN) LIQD Take 1 Container by mouth daily.    [DISCONTINUED] loratadine (CLARITIN) 10 MG tablet Take 10 mg by mouth at  bedtime.   [DISCONTINUED] valACYclovir (VALTREX) 1000 MG tablet Take 1,000 mg by mouth 2 (two) times daily.   No facility-administered encounter medications on file as of 11/23/2019.    Review of Systems  Constitutional: Positive for activity change and appetite change. Negative for diaphoresis, fatigue and fever.  HENT: Negative for congestion.   Respiratory: Positive for cough (dry chronic) and shortness of breath (at rest ). Negative for wheezing.   Cardiovascular: Negative for chest pain, palpitations and leg swelling.  Gastrointestinal: Negative for abdominal distention, constipation and diarrhea.  Endocrine: Negative for polydipsia and polyphagia.  Genitourinary: Negative for difficulty urinating and dysuria.  Musculoskeletal: Positive for gait problem and joint swelling (left foot hematoma). Negative for back pain.  Neurological: Positive for weakness. Negative for dizziness, facial asymmetry, speech difficulty, light-headedness and headaches.  Psychiatric/Behavioral: Positive for confusion.    Immunization History  Administered Date(s) Administered   Influenza Split 12/07/2014   Influenza, High Dose Seasonal PF 12/15/2018   Influenza-Unspecified 11/05/2013, 12/02/2016, 12/16/2017   Moderna SARS-COVID-2 Vaccination 03/07/2019, 04/04/2019   Pneumococcal Polysaccharide-23 03/02/2012   Tdap  03/02/2012   Zoster 03/03/2011   Pertinent  Health Maintenance Due  Topic Date Due   PNA vac Low Risk Adult (2 of 2 - PCV13) 03/02/2013   INFLUENZA VACCINE  10/01/2019   DEXA SCAN  Completed   Fall Risk  03/30/2019 01/13/2019  Falls in the past year? 1 -  Number falls in past yr: 1 -  Injury with Fall? 1 -  Risk for fall due to : History of fall(s) History of fall(s);Impaired balance/gait;Impaired mobility;Medication side effect;Mental status change  Follow up Falls evaluation completed Falls evaluation completed;Education provided;Falls prevention discussed  Comment - done at  [facility] as part of care plan   Functional Status Survey:    Vitals:   11/23/19 1434  BP: 103/69  Pulse: 78  Resp: 18  Temp: (!) 97 F (36.1 C)  Weight: 147 lb (66.7 kg)   Body mass index is 25.23 kg/m. Physical Exam  Labs reviewed: Recent Labs    05/22/19 0000 05/22/19 0600 06/22/19 0000 06/22/19 1250 10/16/19 0000  NA 138  --  138  --  135*   135*  K 4.4  --  3.9  --  4.4   4.4  CL 93*  --  25*  --  99   99  CO2 30*  --  95*  --  24*   24*  BUN 52*  --  40*  --  13   13  CREATININE 1.2*  --  1.1  --  0.7   0.7  CALCIUM  --  10.8*  --  8.8 8.8   8.8   Recent Labs    06/22/19 0000 06/22/19 1250  AST 18  --   ALT 8  --   ALBUMIN  --  3.8   Recent Labs    05/22/19 0600 06/22/19 0000 07/13/19 0000  WBC 12.5 21.6 16.4  NEUTROABS  --   --  11  HGB 14.4 15.8 14.7  HCT 43 47* 45  PLT 212 228 256   Lab Results  Component Value Date   TSH 6.46 (A) 05/22/2019   No results found for: HGBA1C No results found for: CHOL, HDL, LDLCALC, LDLDIRECT, TRIG, CHOLHDL  Significant Diagnostic Results in last 30 days:  No results found.  Assessment/Plan 1. History of squamous cell carcinoma in situ (SCCIS) Goals of care are comfort based Continue silver dressing changes and morphine prior to change and Norco TID D/C voltaren gel due to norco and morphine admin  2. Primary squamous cell carcinoma of chest wall ([place]) Getting larger over time with increased pain. Continue pain management per hospice care  3. Centrilobular emphysema (HCC) Continue Atrovent and Xopenex TID and prn Xopenex for sob or wheezing  4. Chronic respiratory failure with hypoxia (HCC) Continue oxygen 2-3 liters due to hx of PAH and COPD  5. Cor pulmonale, chronic (HCC) Eating less and does not appear overloaded. Will d/c aldactone and keep torsemide for now.   6. FTT With progressive weakness and decreased intake due to multiple co morbid conditions, age, and skin cancers   Family/  staff Communication: nurse  Labs/tests ordered:  NA

## 2019-11-23 NOTE — Telephone Encounter (Signed)
Received FMLA paperwork at [facility] on Tuesday 9/21 from pt's son, Dian Situ, for while she is on hospice care so he can provide psychologic and social support, visits.  Completed today and will be faxed to [phone] per post-it on form.  It is due 11/27/19.  Khia Dieterich L. Bohdi Leeds, D.O. [facility] [address], [place] [address] Cell Phone (Mon-Fri 8am-5pm):  [phone] On Call:  [phone] & follow prompts after 5pm & weekends Office Phone:  [phone] Office Fax:  [phone]

## 2019-11-28 DIAGNOSIS — C761 Malignant neoplasm of thorax: Secondary | ICD-10-CM | POA: Diagnosis not present

## 2019-11-28 DIAGNOSIS — Z9189 Other specified personal risk factors, not elsewhere classified: Secondary | ICD-10-CM | POA: Diagnosis not present

## 2019-11-28 DIAGNOSIS — C44602 Unspecified malignant neoplasm of skin of right upper limb, including shoulder: Secondary | ICD-10-CM | POA: Diagnosis not present

## 2019-11-28 DIAGNOSIS — C44702 Unspecified malignant neoplasm of skin of right lower limb, including hip: Secondary | ICD-10-CM | POA: Diagnosis not present

## 2019-11-28 DIAGNOSIS — Z20828 Contact with and (suspected) exposure to other viral communicable diseases: Secondary | ICD-10-CM | POA: Diagnosis not present

## 2019-11-28 DIAGNOSIS — G9340 Encephalopathy, unspecified: Secondary | ICD-10-CM | POA: Diagnosis not present

## 2019-11-28 DIAGNOSIS — C44509 Unspecified malignant neoplasm of skin of other part of trunk: Secondary | ICD-10-CM | POA: Diagnosis not present

## 2019-11-28 DIAGNOSIS — L089 Local infection of the skin and subcutaneous tissue, unspecified: Secondary | ICD-10-CM | POA: Diagnosis not present

## 2019-12-01 DIAGNOSIS — J449 Chronic obstructive pulmonary disease, unspecified: Secondary | ICD-10-CM | POA: Diagnosis not present

## 2019-12-01 DIAGNOSIS — C44509 Unspecified malignant neoplasm of skin of other part of trunk: Secondary | ICD-10-CM | POA: Diagnosis not present

## 2019-12-01 DIAGNOSIS — I482 Chronic atrial fibrillation, unspecified: Secondary | ICD-10-CM | POA: Diagnosis not present

## 2019-12-01 DIAGNOSIS — M109 Gout, unspecified: Secondary | ICD-10-CM | POA: Diagnosis not present

## 2019-12-01 DIAGNOSIS — I2781 Cor pulmonale (chronic): Secondary | ICD-10-CM | POA: Diagnosis not present

## 2019-12-01 DIAGNOSIS — L089 Local infection of the skin and subcutaneous tissue, unspecified: Secondary | ICD-10-CM | POA: Diagnosis not present

## 2019-12-01 DIAGNOSIS — I272 Pulmonary hypertension, unspecified: Secondary | ICD-10-CM | POA: Diagnosis not present

## 2019-12-01 DIAGNOSIS — C761 Malignant neoplasm of thorax: Secondary | ICD-10-CM | POA: Diagnosis not present

## 2019-12-01 DIAGNOSIS — J9610 Chronic respiratory failure, unspecified whether with hypoxia or hypercapnia: Secondary | ICD-10-CM | POA: Diagnosis not present

## 2019-12-01 DIAGNOSIS — G9340 Encephalopathy, unspecified: Secondary | ICD-10-CM | POA: Diagnosis not present

## 2019-12-01 DIAGNOSIS — N183 Chronic kidney disease, stage 3 unspecified: Secondary | ICD-10-CM | POA: Diagnosis not present

## 2019-12-01 DIAGNOSIS — I129 Hypertensive chronic kidney disease with stage 1 through stage 4 chronic kidney disease, or unspecified chronic kidney disease: Secondary | ICD-10-CM | POA: Diagnosis not present

## 2019-12-01 DIAGNOSIS — C44702 Unspecified malignant neoplasm of skin of right lower limb, including hip: Secondary | ICD-10-CM | POA: Diagnosis not present

## 2019-12-01 DIAGNOSIS — C44602 Unspecified malignant neoplasm of skin of right upper limb, including shoulder: Secondary | ICD-10-CM | POA: Diagnosis not present

## 2019-12-02 DIAGNOSIS — G9340 Encephalopathy, unspecified: Secondary | ICD-10-CM | POA: Diagnosis not present

## 2019-12-02 DIAGNOSIS — C761 Malignant neoplasm of thorax: Secondary | ICD-10-CM | POA: Diagnosis not present

## 2019-12-02 DIAGNOSIS — C44702 Unspecified malignant neoplasm of skin of right lower limb, including hip: Secondary | ICD-10-CM | POA: Diagnosis not present

## 2019-12-02 DIAGNOSIS — C44602 Unspecified malignant neoplasm of skin of right upper limb, including shoulder: Secondary | ICD-10-CM | POA: Diagnosis not present

## 2019-12-02 DIAGNOSIS — L089 Local infection of the skin and subcutaneous tissue, unspecified: Secondary | ICD-10-CM | POA: Diagnosis not present

## 2019-12-02 DIAGNOSIS — C44509 Unspecified malignant neoplasm of skin of other part of trunk: Secondary | ICD-10-CM | POA: Diagnosis not present

## 2019-12-04 DIAGNOSIS — Z20828 Contact with and (suspected) exposure to other viral communicable diseases: Secondary | ICD-10-CM | POA: Diagnosis not present

## 2019-12-04 DIAGNOSIS — L089 Local infection of the skin and subcutaneous tissue, unspecified: Secondary | ICD-10-CM | POA: Diagnosis not present

## 2019-12-04 DIAGNOSIS — G9340 Encephalopathy, unspecified: Secondary | ICD-10-CM | POA: Diagnosis not present

## 2019-12-04 DIAGNOSIS — C44702 Unspecified malignant neoplasm of skin of right lower limb, including hip: Secondary | ICD-10-CM | POA: Diagnosis not present

## 2019-12-04 DIAGNOSIS — C761 Malignant neoplasm of thorax: Secondary | ICD-10-CM | POA: Diagnosis not present

## 2019-12-04 DIAGNOSIS — Z9189 Other specified personal risk factors, not elsewhere classified: Secondary | ICD-10-CM | POA: Diagnosis not present

## 2019-12-04 DIAGNOSIS — C44509 Unspecified malignant neoplasm of skin of other part of trunk: Secondary | ICD-10-CM | POA: Diagnosis not present

## 2019-12-04 DIAGNOSIS — C44602 Unspecified malignant neoplasm of skin of right upper limb, including shoulder: Secondary | ICD-10-CM | POA: Diagnosis not present

## 2019-12-11 ENCOUNTER — Other Ambulatory Visit: Payer: Self-pay | Admitting: Adult Health

## 2019-12-11 DIAGNOSIS — Z9189 Other specified personal risk factors, not elsewhere classified: Secondary | ICD-10-CM | POA: Diagnosis not present

## 2019-12-11 DIAGNOSIS — Z20828 Contact with and (suspected) exposure to other viral communicable diseases: Secondary | ICD-10-CM | POA: Diagnosis not present

## 2019-12-11 DIAGNOSIS — L97909 Non-pressure chronic ulcer of unspecified part of unspecified lower leg with unspecified severity: Secondary | ICD-10-CM

## 2019-12-11 MED ORDER — HYDROCODONE-ACETAMINOPHEN 5-325 MG PO TABS: 1.0000 | ORAL_TABLET | Freq: Three times a day (TID) | ORAL | 0 refills | Status: DC

## 2019-12-12 DIAGNOSIS — C44509 Unspecified malignant neoplasm of skin of other part of trunk: Secondary | ICD-10-CM | POA: Diagnosis not present

## 2019-12-12 DIAGNOSIS — C44602 Unspecified malignant neoplasm of skin of right upper limb, including shoulder: Secondary | ICD-10-CM | POA: Diagnosis not present

## 2019-12-12 DIAGNOSIS — C761 Malignant neoplasm of thorax: Secondary | ICD-10-CM | POA: Diagnosis not present

## 2019-12-12 DIAGNOSIS — G9340 Encephalopathy, unspecified: Secondary | ICD-10-CM | POA: Diagnosis not present

## 2019-12-12 DIAGNOSIS — C44702 Unspecified malignant neoplasm of skin of right lower limb, including hip: Secondary | ICD-10-CM | POA: Diagnosis not present

## 2019-12-12 DIAGNOSIS — L089 Local infection of the skin and subcutaneous tissue, unspecified: Secondary | ICD-10-CM | POA: Diagnosis not present

## 2019-12-18 DIAGNOSIS — C44602 Unspecified malignant neoplasm of skin of right upper limb, including shoulder: Secondary | ICD-10-CM | POA: Diagnosis not present

## 2019-12-18 DIAGNOSIS — C44509 Unspecified malignant neoplasm of skin of other part of trunk: Secondary | ICD-10-CM | POA: Diagnosis not present

## 2019-12-18 DIAGNOSIS — G9340 Encephalopathy, unspecified: Secondary | ICD-10-CM | POA: Diagnosis not present

## 2019-12-18 DIAGNOSIS — C761 Malignant neoplasm of thorax: Secondary | ICD-10-CM | POA: Diagnosis not present

## 2019-12-18 DIAGNOSIS — L089 Local infection of the skin and subcutaneous tissue, unspecified: Secondary | ICD-10-CM | POA: Diagnosis not present

## 2019-12-18 DIAGNOSIS — C44702 Unspecified malignant neoplasm of skin of right lower limb, including hip: Secondary | ICD-10-CM | POA: Diagnosis not present

## 2019-12-20 ENCOUNTER — Other Ambulatory Visit: Payer: Self-pay

## 2019-12-20 DIAGNOSIS — C44602 Unspecified malignant neoplasm of skin of right upper limb, including shoulder: Secondary | ICD-10-CM | POA: Diagnosis not present

## 2019-12-20 DIAGNOSIS — L97909 Non-pressure chronic ulcer of unspecified part of unspecified lower leg with unspecified severity: Secondary | ICD-10-CM

## 2019-12-20 DIAGNOSIS — C44509 Unspecified malignant neoplasm of skin of other part of trunk: Secondary | ICD-10-CM | POA: Diagnosis not present

## 2019-12-20 DIAGNOSIS — G9340 Encephalopathy, unspecified: Secondary | ICD-10-CM | POA: Diagnosis not present

## 2019-12-20 DIAGNOSIS — L089 Local infection of the skin and subcutaneous tissue, unspecified: Secondary | ICD-10-CM | POA: Diagnosis not present

## 2019-12-20 DIAGNOSIS — C44702 Unspecified malignant neoplasm of skin of right lower limb, including hip: Secondary | ICD-10-CM | POA: Diagnosis not present

## 2019-12-20 DIAGNOSIS — C761 Malignant neoplasm of thorax: Secondary | ICD-10-CM | POA: Diagnosis not present

## 2019-12-20 MED ORDER — HYDROCODONE-ACETAMINOPHEN 5-325 MG PO TABS: 1.0000 | ORAL_TABLET | Freq: Three times a day (TID) | ORAL | 0 refills | Status: DC

## 2019-12-21 ENCOUNTER — Non-Acute Institutional Stay (SKILLED_NURSING_FACILITY): Payer: Medicare Other | Admitting: Adult Health

## 2019-12-21 ENCOUNTER — Encounter: Payer: Self-pay | Admitting: Adult Health

## 2019-12-21 DIAGNOSIS — I2781 Cor pulmonale (chronic): Secondary | ICD-10-CM | POA: Diagnosis not present

## 2019-12-21 DIAGNOSIS — C761 Malignant neoplasm of thorax: Secondary | ICD-10-CM

## 2019-12-21 DIAGNOSIS — R627 Adult failure to thrive: Secondary | ICD-10-CM

## 2019-12-21 DIAGNOSIS — I4821 Permanent atrial fibrillation: Secondary | ICD-10-CM

## 2019-12-21 DIAGNOSIS — S81802S Unspecified open wound, left lower leg, sequela: Secondary | ICD-10-CM | POA: Diagnosis not present

## 2019-12-21 DIAGNOSIS — J432 Centrilobular emphysema: Secondary | ICD-10-CM | POA: Diagnosis not present

## 2019-12-21 DIAGNOSIS — J9611 Chronic respiratory failure with hypoxia: Secondary | ICD-10-CM | POA: Diagnosis not present

## 2019-12-21 NOTE — Progress Notes (Signed)
Location:  [facility] of Service:  SNF (31) Provider:   Cindi Carbon, ANP [place] [phone]  [name], DO  Patient Care Team: [name], DO as PCP - General (Geriatric Medicine) Sanda Klein, MD as PCP - Cardiology (Cardiology)  Extended Emergency Contact Information Primary Emergency Contact: [name] of [place] Phone: [phone] Work Phone: [phone] Mobile Phone: [phone] Relation: Daughter Secondary Emergency Contact: Daira, Hine Work Phone: [phone] Mobile Phone: [phone] Relation: Son  Code Status:  DNR Goals of care: Advanced Directive information Advanced Directives 10/24/2019  Does Patient Have a Medical Advance Directive? Yes  Type of Advance Directive Out of facility DNR (pink MOST or yellow form)  Does patient want to make changes to medical advance directive? No - Patient declined  Copy of [place] in Chart? -  Would patient like information on creating a medical advance directive? -  Pre-existing out of facility DNR order (yellow form or pink MOST form) Pink MOST/Yellow Form most recent copy in chart - Physician notified to receive inpatient order     Chief Complaint  Patient presents with   Medical Management of Chronic Issues    HPI:  Pt is a 86 y.o. female seen today for medical management of chronic diseases.    [name] is followed by hospice due to [facility] of the chest wall which is enlarging as well as skin cancer that is painful to the right arm. She is not eating and drinking well and spends most of the day in bed sleeping. Her pain is controlled with the current regimen. On 10/20 she vomited up some bilious material with blood. She received Phenergan and state today she is feeling better with no nausea, vomiting or abd pain. Bowels are moving regularly.  She has wounds to her chest well on the right and left that are  enlarging and draining green material.   She has a large hematoma to the left foot of unknown origin which is wrapped and sometimes painful and has a foul odor.    COPD: She continues on 3 liters of oxygen with no resp distress. Has chronic cough with minimal sputum production.   Cor pulmonale: no sob noted. Has chronic DOE but is not getting out of bed now so its not an issue. They are not checking her weight due to her hospice status.   Goals of care are comfort based with no treatment for her skin cancers. DNR in place.   Past Medical History:  Diagnosis Date   Arterial leg ulcer ([place]) 10/14/2018   Breast cancer ([place])    left    Centrilobular emphysema ([place]) 12/23/2017   Chronic anticoagulation    Chronic atrial fibrillation (HCC)    Chronic respiratory failure with hypoxia ([place]) 03/26/2018   CKD (chronic kidney disease), stage III (HCC)    Cor pulmonale, chronic (HCC) 06/13/2013   Pulmonary artery pressure 72 06/15/17 with severe tricuspid regurg   Essential hypertension 06/13/2013   Hypertension    PAH (pulmonary artery hypertension) ([place]) 03/25/2017   Pulmonary artery pressure 72 06/15/17 with severe tricuspid regurg   Pelvic fracture ([place]) 05/2016   Permanent atrial fibrillation ([place]) 03/26/2018   Past Surgical History:  Procedure Laterality Date   ABDOMINAL HYSTERECTOMY     BREAST LUMPECTOMY      Allergies  Allergen Reactions   Aspirin     Upset stomach   Neosporin [Bacitracin-Polymyxin B]  Unsure of reaction   Codeine Nausea Only    Nausea     Outpatient Encounter Medications as of 12/21/2019  Medication Sig   ALPRAZolam (XANAX) 0.5 MG tablet Take 1 tablet (0.5 mg total) by mouth at bedtime.   antiseptic oral rinse (BIOTENE) LIQD 15 mLs by Mouth Rinse route as needed for dry mouth.   dextromethorphan (DELSYM) 30 MG/5ML liquid Take 5 mLs by mouth every 4 (four) hours as needed for cough.    Eyelid Cleansers (OCUSOFT EYELID CLEANSING) PADS  Apply 1 application topically daily.   HYDROcodone-acetaminophen (NORCO) 5-325 MG tablet Take 1 tablet by mouth every 8 (eight) hours.   HYDROcodone-acetaminophen (NORCO/VICODIN) 5-325 MG tablet Take 1 tablet by mouth every 4 (four) hours as needed for moderate pain.   ipratropium (ATROVENT) 0.02 % nebulizer solution Take 2.5 mLs (0.5 mg total) by nebulization 3 (three) times daily.   levalbuterol (XOPENEX) 1.25 MG/0.5ML nebulizer solution Take 1.25 mg by nebulization 3 (three) times daily.   levalbuterol (XOPENEX) 1.25 MG/3ML nebulizer solution Take 1.25 mg by nebulization every 6 (six) hours as needed for wheezing or shortness of breath. In addition to three times daily schedule   metoprolol succinate (TOPROL-XL) 25 MG 24 hr tablet Take 12.5 mg by mouth daily.   morphine (ROXANOL) 20 MG/ML concentrated solution Take 5 mg by mouth every 4 (four) hours as needed for severe pain.   morphine (ROXANOL) 20 MG/ML concentrated solution Take 10 mg by mouth daily as needed for moderate pain or severe pain. Prior to dressing change   OXYGEN Level 3 or 3 mL/minute when out of room every shift 1, 2, and 3   Polyethyl Glyc-Propyl Glyc PF (SYSTANE PRESERVATIVE FREE) 0.4-0.3 % SOLN Apply 1 drop to eye 3 (three) times daily as needed (dry eyes).   polyethylene glycol (MIRALAX / GLYCOLAX) 17 g packet Take 17 g by mouth daily.    torsemide (DEMADEX) 20 MG tablet Take 40 mg by mouth daily.    triamcinolone cream (KENALOG) 0.1 % Apply 1 application topically as needed. To right arm for itching   [DISCONTINUED] apixaban (ELIQUIS) 5 MG TABS tablet Take 1 tablet (5 mg total) by mouth 2 (two) times daily.   No facility-administered encounter medications on file as of 12/21/2019.    Review of Systems  Constitutional: Positive for appetite change. Negative for activity change, chills, diaphoresis, fatigue, fever and unexpected weight change.  HENT: Negative for congestion.   Respiratory: Positive for  cough. Negative for shortness of breath and wheezing.   Cardiovascular: Positive for leg swelling. Negative for chest pain and palpitations.  Gastrointestinal: Negative for abdominal distention, abdominal pain, constipation, diarrhea, nausea and vomiting.  Genitourinary: Negative for difficulty urinating and dysuria.  Musculoskeletal: Positive for gait problem. Negative for arthralgias, back pain, joint swelling and myalgias.  Skin: Positive for wound.  Neurological: Positive for weakness. Negative for dizziness, tremors, seizures, syncope, facial asymmetry, speech difficulty, light-headedness, numbness and headaches.  Psychiatric/Behavioral: Positive for confusion. Negative for agitation and behavioral problems.    Immunization History  Administered Date(s) Administered   Influenza Split 12/07/2014   Influenza, High Dose Seasonal PF 12/15/2018   Influenza-Unspecified 11/05/2013, 12/02/2016, 12/16/2017   Moderna SARS-COVID-2 Vaccination 03/07/2019, 04/04/2019   Pneumococcal Polysaccharide-23 03/02/2012   Tdap 03/02/2012   Zoster 03/03/2011   Pertinent  Health Maintenance Due  Topic Date Due   PNA vac Low Risk Adult (2 of 2 - PCV13) 03/02/2013   INFLUENZA VACCINE  10/01/2019   DEXA SCAN  Completed  Fall Risk  03/30/2019 01/13/2019  Falls in the past year? 1 -  Number falls in past yr: 1 -  Injury with Fall? 1 -  Risk for fall due to : History of fall(s) History of fall(s);Impaired balance/gait;Impaired mobility;Medication side effect;Mental status change  Follow up Falls evaluation completed Falls evaluation completed;Education provided;Falls prevention discussed  Comment - done at [facility] as part of care plan   Functional Status Survey:    There were no vitals filed for this visit. There is no height or weight on file to calculate BMI. Physical Exam Vitals and nursing note reviewed.  Constitutional:      General: She is not in acute distress.    Appearance:  She is not diaphoretic.  HENT:     Head: Normocephalic and atraumatic.  Neck:     Vascular: No JVD.  Cardiovascular:     Rate and Rhythm: Normal rate. Rhythm irregular.     Heart sounds: No murmur heard.   Pulmonary:     Effort: Pulmonary effort is normal. No respiratory distress.     Breath sounds: Wheezing present.  Abdominal:     General: Bowel sounds are normal. There is no distension.     Palpations: Abdomen is soft.     Tenderness: There is no abdominal tenderness. There is no right CVA tenderness or left CVA tenderness.  Musculoskeletal:     Right lower leg: No edema.     Left lower leg: No edema.  Skin:    General: Skin is warm and dry.     Comments: Large round raised lesion to the right and left chest wall with surrounding erythema that are tender to touch  Dressing with kerlix CDI to LLE. Dressing with kerlix CDI to RLE>   Neurological:     General: No focal deficit present.     Mental Status: She is alert. Mental status is at baseline.  Psychiatric:        Mood and Affect: Mood normal.     Labs reviewed: Recent Labs    05/22/19 0000 05/22/19 0600 06/22/19 0000 06/22/19 1250 10/16/19 0000  NA 138  --  138  --  135*   135*  K 4.4  --  3.9  --  4.4   4.4  CL 93*  --  25*  --  99   99  CO2 30*  --  95*  --  24*   24*  BUN 52*  --  40*  --  13   13  CREATININE 1.2*  --  1.1  --  0.7   0.7  CALCIUM  --  10.8*  --  8.8 8.8   8.8   Recent Labs    06/22/19 0000 06/22/19 1250  AST 18  --   ALT 8  --   ALBUMIN  --  3.8   Recent Labs    05/22/19 0600 06/22/19 0000 07/13/19 0000  WBC 12.5 21.6 16.4  NEUTROABS  --   --  11  HGB 14.4 15.8 14.7  HCT 43 47* 45  PLT 212 228 256   Lab Results  Component Value Date   TSH 6.46 (A) 05/22/2019   No results found for: HGBA1C No results found for: CHOL, HDL, LDLCALC, LDLDIRECT, TRIG, CHOLHDL  Significant Diagnostic Results in last 30 days:  No results found.  Assessment/Plan 1. 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 No additional treatment  due to her goals of care. Hospice in place.   2. Centrilobular emphysema (HCC) Currently controlled with Atrovent and Xopenex  Could discontinue if this is bothersome as she appears to be nearing the end of life.   3. Chronic respiratory failure with hypoxia (HCC) Continue oxygen 2-3 liters continuously   4. Permanent atrial fibrillation (HCC) Rate remains controlled on BB Off anticoagulation due to goals of care   5. Cor pulmonale, chronic (HCC) Not appearing overloaded   No weights to review Continue Torsemide 40 mg qd   6. FTT (failure to thrive) in adult Progressively weaker, sleeping more, eating less due to decline associated with her skin cancers and other co morbidities  7. Leg wound, left, sequela Continues dressing changes and leg elevation     Family/ staff Communication: resident   Labs/tests ordered:  NA

## 2019-12-22 DIAGNOSIS — Z23 Encounter for immunization: Secondary | ICD-10-CM | POA: Diagnosis not present

## 2019-12-27 DIAGNOSIS — C761 Malignant neoplasm of thorax: Secondary | ICD-10-CM | POA: Diagnosis not present

## 2019-12-27 DIAGNOSIS — L089 Local infection of the skin and subcutaneous tissue, unspecified: Secondary | ICD-10-CM | POA: Diagnosis not present

## 2019-12-27 DIAGNOSIS — C44702 Unspecified malignant neoplasm of skin of right lower limb, including hip: Secondary | ICD-10-CM | POA: Diagnosis not present

## 2019-12-27 DIAGNOSIS — C44509 Unspecified malignant neoplasm of skin of other part of trunk: Secondary | ICD-10-CM | POA: Diagnosis not present

## 2019-12-27 DIAGNOSIS — G9340 Encephalopathy, unspecified: Secondary | ICD-10-CM | POA: Diagnosis not present

## 2019-12-27 DIAGNOSIS — C44602 Unspecified malignant neoplasm of skin of right upper limb, including shoulder: Secondary | ICD-10-CM | POA: Diagnosis not present

## 2020-01-01 DIAGNOSIS — G9340 Encephalopathy, unspecified: Secondary | ICD-10-CM | POA: Diagnosis not present

## 2020-01-01 DIAGNOSIS — L089 Local infection of the skin and subcutaneous tissue, unspecified: Secondary | ICD-10-CM | POA: Diagnosis not present

## 2020-01-01 DIAGNOSIS — C44702 Unspecified malignant neoplasm of skin of right lower limb, including hip: Secondary | ICD-10-CM | POA: Diagnosis not present

## 2020-01-01 DIAGNOSIS — J449 Chronic obstructive pulmonary disease, unspecified: Secondary | ICD-10-CM | POA: Diagnosis not present

## 2020-01-01 DIAGNOSIS — C44509 Unspecified malignant neoplasm of skin of other part of trunk: Secondary | ICD-10-CM | POA: Diagnosis not present

## 2020-01-01 DIAGNOSIS — M109 Gout, unspecified: Secondary | ICD-10-CM | POA: Diagnosis not present

## 2020-01-01 DIAGNOSIS — C761 Malignant neoplasm of thorax: Secondary | ICD-10-CM | POA: Diagnosis not present

## 2020-01-01 DIAGNOSIS — C44602 Unspecified malignant neoplasm of skin of right upper limb, including shoulder: Secondary | ICD-10-CM | POA: Diagnosis not present

## 2020-01-01 DIAGNOSIS — I129 Hypertensive chronic kidney disease with stage 1 through stage 4 chronic kidney disease, or unspecified chronic kidney disease: Secondary | ICD-10-CM | POA: Diagnosis not present

## 2020-01-01 DIAGNOSIS — N183 Chronic kidney disease, stage 3 unspecified: Secondary | ICD-10-CM | POA: Diagnosis not present

## 2020-01-01 DIAGNOSIS — J9610 Chronic respiratory failure, unspecified whether with hypoxia or hypercapnia: Secondary | ICD-10-CM | POA: Diagnosis not present

## 2020-01-01 DIAGNOSIS — I2781 Cor pulmonale (chronic): Secondary | ICD-10-CM | POA: Diagnosis not present

## 2020-01-01 DIAGNOSIS — I272 Pulmonary hypertension, unspecified: Secondary | ICD-10-CM | POA: Diagnosis not present

## 2020-01-01 DIAGNOSIS — I482 Chronic atrial fibrillation, unspecified: Secondary | ICD-10-CM | POA: Diagnosis not present

## 2020-01-02 DIAGNOSIS — C44509 Unspecified malignant neoplasm of skin of other part of trunk: Secondary | ICD-10-CM | POA: Diagnosis not present

## 2020-01-02 DIAGNOSIS — G9340 Encephalopathy, unspecified: Secondary | ICD-10-CM | POA: Diagnosis not present

## 2020-01-02 DIAGNOSIS — L089 Local infection of the skin and subcutaneous tissue, unspecified: Secondary | ICD-10-CM | POA: Diagnosis not present

## 2020-01-02 DIAGNOSIS — C761 Malignant neoplasm of thorax: Secondary | ICD-10-CM | POA: Diagnosis not present

## 2020-01-02 DIAGNOSIS — C44602 Unspecified malignant neoplasm of skin of right upper limb, including shoulder: Secondary | ICD-10-CM | POA: Diagnosis not present

## 2020-01-02 DIAGNOSIS — C44702 Unspecified malignant neoplasm of skin of right lower limb, including hip: Secondary | ICD-10-CM | POA: Diagnosis not present

## 2020-01-08 DIAGNOSIS — C44602 Unspecified malignant neoplasm of skin of right upper limb, including shoulder: Secondary | ICD-10-CM | POA: Diagnosis not present

## 2020-01-08 DIAGNOSIS — C44702 Unspecified malignant neoplasm of skin of right lower limb, including hip: Secondary | ICD-10-CM | POA: Diagnosis not present

## 2020-01-08 DIAGNOSIS — G9340 Encephalopathy, unspecified: Secondary | ICD-10-CM | POA: Diagnosis not present

## 2020-01-08 DIAGNOSIS — C761 Malignant neoplasm of thorax: Secondary | ICD-10-CM | POA: Diagnosis not present

## 2020-01-08 DIAGNOSIS — C44509 Unspecified malignant neoplasm of skin of other part of trunk: Secondary | ICD-10-CM | POA: Diagnosis not present

## 2020-01-08 DIAGNOSIS — L089 Local infection of the skin and subcutaneous tissue, unspecified: Secondary | ICD-10-CM | POA: Diagnosis not present

## 2020-01-16 DIAGNOSIS — C44509 Unspecified malignant neoplasm of skin of other part of trunk: Secondary | ICD-10-CM | POA: Diagnosis not present

## 2020-01-16 DIAGNOSIS — C761 Malignant neoplasm of thorax: Secondary | ICD-10-CM | POA: Diagnosis not present

## 2020-01-16 DIAGNOSIS — L089 Local infection of the skin and subcutaneous tissue, unspecified: Secondary | ICD-10-CM | POA: Diagnosis not present

## 2020-01-16 DIAGNOSIS — G9340 Encephalopathy, unspecified: Secondary | ICD-10-CM | POA: Diagnosis not present

## 2020-01-16 DIAGNOSIS — C44602 Unspecified malignant neoplasm of skin of right upper limb, including shoulder: Secondary | ICD-10-CM | POA: Diagnosis not present

## 2020-01-16 DIAGNOSIS — C44702 Unspecified malignant neoplasm of skin of right lower limb, including hip: Secondary | ICD-10-CM | POA: Diagnosis not present

## 2020-01-23 ENCOUNTER — Non-Acute Institutional Stay (SKILLED_NURSING_FACILITY): Payer: Medicare Other | Admitting: Internal Medicine

## 2020-01-23 ENCOUNTER — Encounter: Payer: Self-pay | Admitting: Internal Medicine

## 2020-01-23 DIAGNOSIS — L089 Local infection of the skin and subcutaneous tissue, unspecified: Secondary | ICD-10-CM | POA: Diagnosis not present

## 2020-01-23 DIAGNOSIS — I2781 Cor pulmonale (chronic): Secondary | ICD-10-CM

## 2020-01-23 DIAGNOSIS — C761 Malignant neoplasm of thorax: Secondary | ICD-10-CM

## 2020-01-23 DIAGNOSIS — J9611 Chronic respiratory failure with hypoxia: Secondary | ICD-10-CM

## 2020-01-23 DIAGNOSIS — C44702 Unspecified malignant neoplasm of skin of right lower limb, including hip: Secondary | ICD-10-CM | POA: Diagnosis not present

## 2020-01-23 DIAGNOSIS — N1831 Chronic kidney disease, stage 3a: Secondary | ICD-10-CM | POA: Diagnosis not present

## 2020-01-23 DIAGNOSIS — I4821 Permanent atrial fibrillation: Secondary | ICD-10-CM

## 2020-01-23 DIAGNOSIS — G9340 Encephalopathy, unspecified: Secondary | ICD-10-CM | POA: Diagnosis not present

## 2020-01-23 DIAGNOSIS — J432 Centrilobular emphysema: Secondary | ICD-10-CM

## 2020-01-23 DIAGNOSIS — C44602 Unspecified malignant neoplasm of skin of right upper limb, including shoulder: Secondary | ICD-10-CM | POA: Diagnosis not present

## 2020-01-23 DIAGNOSIS — C44509 Unspecified malignant neoplasm of skin of other part of trunk: Secondary | ICD-10-CM | POA: Diagnosis not present

## 2020-01-23 NOTE — Progress Notes (Signed)
Location:  [facility] Room Number: 137 Place of Service:  SNF (31) Provider:  Opal Dinning L. Mariea Clonts, D.O., C.M.D.  [name], DO  Patient Care Team: [name], DO as PCP - General (Geriatric Medicine) Sanda Klein, MD as PCP - Cardiology (Cardiology)  Extended Emergency Contact Information Primary Emergency Contact: [name] of [place] Phone: [phone] Work Phone: [phone] Mobile Phone: [phone] Relation: Daughter Secondary Emergency Contact: Rhyder, Bratz Work Phone: [phone] Mobile Phone: [phone] Relation: Son  Code Status:  DNR Goals of care: Advanced Directive information Advanced Directives 01/23/2020  Does Patient Have a Medical Advance Directive? Yes  Type of Advance Directive Out of facility DNR (pink MOST or yellow form)  Does patient want to make changes to medical advance directive? No - Patient declined  Copy of [place] in Chart? -  Would patient like information on creating a medical advance directive? -  Pre-existing out of facility DNR order (yellow form or pink MOST form) Pink MOST form placed in chart (order not valid for inpatient use)     Chief Complaint  Patient presents with  . Medical Management of Chronic Issues    Routine visit     HPI:  Pt is a 86 y.o. female seen today for medical management of chronic diseases.  Kennyth Lose lives in [place] on hospice care.  She has struggled with several chronic illnesses including pulmonary htn/cor pulmonale d/t centrilobular emphysema with chronic respiratory failure, permanent afib, chronic constipation, CKD3, arterial leg ulcer, and numerous areas of squamous cell carcinoma of her skin including her extremities, chest wall and now on the left side of her temple.  She's had aggressive treatments for these with debridement and topical therapies, but they continued to progress and return.  She's had terrible pain  from the wounds and she and her daughter decided to move to hospice care for her comfort.  Prior to this, she'd also had her pelvic fxs and mobility had decreased considerably--she'd gotten to where she could ambulate with the walker again and then covid isolation hit and affected her progress.    When seen, Kennyth Lose was resting in bed comfortably wearing her oxygen and snuggled under 3 blankets.  She denied pain as she was.  She did not appear to recognize me after a few months.  She was pleasant, but did not initiate conversation, only responded to questions.  Nursing staff reported continued development of skin cancer lesions and progression of those she's had.  Palliative dressing changes are being performed with premedication.    She has not had any new challenges with her constipation nor has she had volume overload or COPD exacerbation.  Past Medical History:  Diagnosis Date  . Arterial leg ulcer ([place]) 10/14/2018  . Breast cancer ([place])    left   . Centrilobular emphysema ([place]) 12/23/2017  . Chronic anticoagulation   . Chronic atrial fibrillation ([place])   . Chronic respiratory failure with hypoxia ([place]) 03/26/2018  . CKD (chronic kidney disease), stage III ([place])   . Cor pulmonale, chronic ([place]) 06/13/2013   Pulmonary artery pressure 72 06/15/17 with severe tricuspid regurg  . Essential hypertension 06/13/2013  . Hypertension   . PAH (pulmonary artery hypertension) ([place]) 03/25/2017   Pulmonary artery pressure 72 06/15/17 with severe tricuspid regurg  . Pelvic fracture ([place]) 05/2016  . Permanent atrial fibrillation ([place]) 03/26/2018   Past Surgical History:  Procedure Laterality Date  . ABDOMINAL HYSTERECTOMY    . BREAST  LUMPECTOMY      Allergies  Allergen Reactions  . Aspirin     Upset stomach  . Neosporin [Bacitracin-Polymyxin B]     Unsure of reaction  . Codeine Nausea Only    Nausea     Outpatient Encounter Medications as of 01/23/2020  Medication Sig  . ALPRAZolam (XANAX) 0.5  MG tablet Take 1 tablet (0.5 mg total) by mouth at bedtime.  Marland Kitchen antiseptic oral rinse (BIOTENE) LIQD 15 mLs by Mouth Rinse route as needed for dry mouth.  . Benzocaine-Menthol (CEPACOL SORE THROAT) 15-2.6 MG LOZG Use as directed 1 lozenge in the mouth or throat. Sore throat  . Dextromethorphan-guaiFENesin (DELSYM COUGH/CHEST CONGEST DM) 5-100 MG/5ML LIQD Take 5 mLs by mouth every 4 (four) hours as needed.  . Eyelid Cleansers (OCUSOFT EYELID CLEANSING) PADS Apply 1 application topically daily.  Marland Kitchen HYDROcodone-acetaminophen (NORCO) 5-325 MG tablet Take 1 tablet by mouth every 8 (eight) hours.  Marland Kitchen HYDROcodone-acetaminophen (NORCO/VICODIN) 5-325 MG tablet Take 1 tablet by mouth every 4 (four) hours as needed for moderate pain.  Marland Kitchen ipratropium (ATROVENT) 0.02 % nebulizer solution Take 2.5 mLs (0.5 mg total) by nebulization 3 (three) times daily.  Marland Kitchen levalbuterol (XOPENEX) 1.25 MG/0.5ML nebulizer solution Take 1.25 mg by nebulization 3 (three) times daily.  Marland Kitchen levalbuterol (XOPENEX) 1.25 MG/3ML nebulizer solution Take 1.25 mg by nebulization every 6 (six) hours as needed for wheezing or shortness of breath. In addition to three times daily schedule  . metoprolol succinate (TOPROL-XL) 25 MG 24 hr tablet Take 12.5 mg by mouth daily. .13m  . morphine (ROXANOL) 20 MG/ML concentrated solution Take 5 mg by mouth every 4 (four) hours as needed for severe pain.  .Marland Kitchenmorphine (ROXANOL) 20 MG/ML concentrated solution Take 10 mg by mouth daily as needed for moderate pain or severe pain. Prior to dressing change  . Nutritional Supplements (BOOST PLUS PO) Take by mouth. Lactose reduced  . OXYGEN Level 3 or 3 mL/minute when out of room every shift 1, 2, and 3  . Polyethyl Glyc-Propyl Glyc PF (SYSTANE PRESERVATIVE FREE) 0.4-0.3 % SOLN Apply 1 drop to eye 3 (three) times daily as needed (dry eyes).  . polyethylene glycol (MIRALAX / GLYCOLAX) 17 g packet Take 17 g by mouth daily.   .Marland Kitchentorsemide (DEMADEX) 20 MG tablet Take 40 mg  by mouth daily.   .Marland Kitchentriamcinolone cream (KENALOG) 0.1 % Apply 1 application topically as needed. To right arm for itching  . white petrolatum ointment Apply 41 % topically as needed for dry skin. OTC  Aquaphor  . [DISCONTINUED] dextromethorphan (DELSYM) 30 MG/5ML liquid Take 5 mLs by mouth every 4 (four) hours as needed for cough.    No facility-administered encounter medications on file as of 01/23/2020.    Review of Systems  Constitutional: Positive for malaise/fatigue and weight loss. Negative for chills and fever.       Down 3 lbs in 2 mos  HENT: Positive for hearing loss. Negative for congestion and sore throat.   Eyes: Negative for blurred vision.  Respiratory: Positive for cough and sputum production. Negative for shortness of breath and wheezing.        Not sob at rest with O2  Cardiovascular: Positive for chest pain. Negative for palpitations and leg swelling.       Superficial from skin cancer not internal  Gastrointestinal: Negative for abdominal pain and constipation.  Genitourinary: Negative for dysuria.  Musculoskeletal: Negative for falls and joint pain.  Skin:  Areas of skin cancer on right arm, legs, left temple and multiple on chest wall  Neurological: Positive for weakness. Negative for dizziness and loss of consciousness.  Endo/Heme/Allergies: Bruises/bleeds easily.  Psychiatric/Behavioral: Positive for memory loss. Negative for depression. The patient is not nervous/anxious and does not have insomnia.        Confusion and decreased cognition overall with adequate pain control    Immunization History  Administered Date(s) Administered  . Influenza Split 12/07/2014  . Influenza, High Dose Seasonal PF 12/15/2018  . Influenza-Unspecified 11/05/2013, 12/02/2016, 12/16/2017  . Moderna SARS-COVID-2 Vaccination 03/07/2019, 04/04/2019  . Pneumococcal Polysaccharide-23 03/02/2012  . Tdap 03/02/2012  . Zoster 03/03/2011   Pertinent  Health Maintenance Due  Topic  Date Due  . PNA vac Low Risk Adult (2 of 2 - PCV13) 03/02/2013  . INFLUENZA VACCINE  10/01/2019  . DEXA SCAN  Completed   Fall Risk  03/30/2019 01/13/2019  Falls in the past year? 1 -  Number falls in past yr: 1 -  Injury with Fall? 1 -  Risk for fall due to : History of fall(s) History of fall(s);Impaired balance/gait;Impaired mobility;Medication side effect;Mental status change  Follow up Falls evaluation completed Falls evaluation completed;Education provided;Falls prevention discussed  Comment - done at [facility] as part of care plan   Functional Status Survey:    Vitals:   01/23/20 1600  BP: 96/61  Pulse: 69  Temp: (!) 97.5 F (36.4 C)  SpO2: 96%  Weight: 143 lb 14.4 oz (65.3 kg)  Height: _0  (1.626 m)   Body mass index is 24.7 kg/m. Physical Exam Constitutional:      General: She is not in acute distress.    Appearance: She is ill-appearing. She is not toxic-appearing.  HENT:     Head: Normocephalic and atraumatic.     Right Ear: External ear normal.     Left Ear: External ear normal.  Eyes:     Conjunctiva/sclera: Conjunctivae normal.     Pupils: Pupils are equal, round, and reactive to light.  Cardiovascular:     Rate and Rhythm: Rhythm irregular.     Heart sounds: No murmur heard.   Pulmonary:     Effort: Pulmonary effort is normal.     Breath sounds: Rhonchi present. No wheezing or rales.  Abdominal:     General: Bowel sounds are normal. There is no distension.     Palpations: Abdomen is soft.     Tenderness: There is no abdominal tenderness. There is no guarding or rebound.  Musculoskeletal:        General: Normal range of motion.     Cervical back: Neck supple.  Skin:    Comments: Numerous areas of squamous cell ca:  Left temple, right arm, chest wall; swelling of foot with dressing intact, as well  Neurological:     Mental Status: She is alert.     Motor: Weakness present.     Gait: Gait abnormal.     Comments: Staying in bed  Psychiatric:         Mood and Affect: Mood normal.     Labs reviewed: Recent Labs    05/22/19 0000 05/22/19 0600 06/22/19 0000 06/22/19 1250 10/16/19 0000  NA 138  --  138  --  135*  135*  K 4.4  --  3.9  --  4.4  4.4  CL 93*  --  25*  --  99  99  CO2 30*  --  95*  --  24*  24*  BUN 52*  --  40*  --  13  13  CREATININE 1.2*  --  1.1  --  0.7  0.7  CALCIUM  --  10.8*  --  8.8 8.8  8.8   Recent Labs    06/22/19 0000 06/22/19 1250  AST 18  --   ALT 8  --   ALBUMIN  --  3.8   Recent Labs    05/22/19 0600 06/22/19 0000 07/13/19 0000  WBC 12.5 21.6 16.4  NEUTROABS  --   --  11  HGB 14.4 15.8 14.7  HCT 43 47* 45  PLT 212 228 256   Lab Results  Component Value Date   TSH 6.46 (A) 05/22/2019    Assessment/Plan 1. Primary squamous cell carcinoma of chest wall (HCC) -and several other areas -cont palliative dressing changes and pain mgt under hospice care  2. Centrilobular emphysema ([place]) -unchanged, continue oxygen, cough syrup, xopenex nebs, prn roxanol  3. Cor pulmonale, chronic (HCC) -unchanged, continue torsemide daily  4. Chronic respiratory failure with hypoxia (HCC) -cont oxygen therapy, roxanol if sob and xanax nightly for anxiety related to this and generalized  5. Permanent atrial fibrillation (HCC) -cont metoprolol for rate control -off anticoagulation due to comfort-based goals and prognosis  6. Stage 3a chronic kidney disease ([place]) -avoid testing due to comfort-based goals   Family/ staff Communication: d/w snf nurse, notes in matrix and hospice reviewed  Labs/tests ordered:  None due to goals of care  Levada Bowersox L. Kalyn Dimattia, D.O. [facility] [address], [place] [address] Cell Phone (Mon-Fri 8am-5pm):  [phone] On Call:  [phone] & follow prompts after 5pm & weekends Office Phone:  [phone] Office Fax:  [phone]

## 2020-01-31 DIAGNOSIS — M109 Gout, unspecified: Secondary | ICD-10-CM | POA: Diagnosis not present

## 2020-01-31 DIAGNOSIS — I272 Pulmonary hypertension, unspecified: Secondary | ICD-10-CM | POA: Diagnosis not present

## 2020-01-31 DIAGNOSIS — N183 Chronic kidney disease, stage 3 unspecified: Secondary | ICD-10-CM | POA: Diagnosis not present

## 2020-01-31 DIAGNOSIS — C44509 Unspecified malignant neoplasm of skin of other part of trunk: Secondary | ICD-10-CM | POA: Diagnosis not present

## 2020-01-31 DIAGNOSIS — C44702 Unspecified malignant neoplasm of skin of right lower limb, including hip: Secondary | ICD-10-CM | POA: Diagnosis not present

## 2020-01-31 DIAGNOSIS — J449 Chronic obstructive pulmonary disease, unspecified: Secondary | ICD-10-CM | POA: Diagnosis not present

## 2020-01-31 DIAGNOSIS — I2781 Cor pulmonale (chronic): Secondary | ICD-10-CM | POA: Diagnosis not present

## 2020-01-31 DIAGNOSIS — L089 Local infection of the skin and subcutaneous tissue, unspecified: Secondary | ICD-10-CM | POA: Diagnosis not present

## 2020-01-31 DIAGNOSIS — I482 Chronic atrial fibrillation, unspecified: Secondary | ICD-10-CM | POA: Diagnosis not present

## 2020-01-31 DIAGNOSIS — C44602 Unspecified malignant neoplasm of skin of right upper limb, including shoulder: Secondary | ICD-10-CM | POA: Diagnosis not present

## 2020-01-31 DIAGNOSIS — G9340 Encephalopathy, unspecified: Secondary | ICD-10-CM | POA: Diagnosis not present

## 2020-01-31 DIAGNOSIS — C761 Malignant neoplasm of thorax: Secondary | ICD-10-CM | POA: Diagnosis not present

## 2020-01-31 DIAGNOSIS — J9610 Chronic respiratory failure, unspecified whether with hypoxia or hypercapnia: Secondary | ICD-10-CM | POA: Diagnosis not present

## 2020-01-31 DIAGNOSIS — I129 Hypertensive chronic kidney disease with stage 1 through stage 4 chronic kidney disease, or unspecified chronic kidney disease: Secondary | ICD-10-CM | POA: Diagnosis not present

## 2020-02-01 DIAGNOSIS — C44602 Unspecified malignant neoplasm of skin of right upper limb, including shoulder: Secondary | ICD-10-CM | POA: Diagnosis not present

## 2020-02-01 DIAGNOSIS — G9340 Encephalopathy, unspecified: Secondary | ICD-10-CM | POA: Diagnosis not present

## 2020-02-01 DIAGNOSIS — C761 Malignant neoplasm of thorax: Secondary | ICD-10-CM | POA: Diagnosis not present

## 2020-02-01 DIAGNOSIS — C44702 Unspecified malignant neoplasm of skin of right lower limb, including hip: Secondary | ICD-10-CM | POA: Diagnosis not present

## 2020-02-01 DIAGNOSIS — L089 Local infection of the skin and subcutaneous tissue, unspecified: Secondary | ICD-10-CM | POA: Diagnosis not present

## 2020-02-01 DIAGNOSIS — C44509 Unspecified malignant neoplasm of skin of other part of trunk: Secondary | ICD-10-CM | POA: Diagnosis not present

## 2020-02-02 ENCOUNTER — Other Ambulatory Visit: Payer: Self-pay | Admitting: Adult Health

## 2020-02-02 DIAGNOSIS — L97909 Non-pressure chronic ulcer of unspecified part of unspecified lower leg with unspecified severity: Secondary | ICD-10-CM

## 2020-02-02 MED ORDER — HYDROCODONE-ACETAMINOPHEN 5-325 MG PO TABS: 1.0000 | ORAL_TABLET | Freq: Three times a day (TID) | ORAL | 0 refills | Status: AC

## 2020-02-05 DIAGNOSIS — L089 Local infection of the skin and subcutaneous tissue, unspecified: Secondary | ICD-10-CM | POA: Diagnosis not present

## 2020-02-05 DIAGNOSIS — C44602 Unspecified malignant neoplasm of skin of right upper limb, including shoulder: Secondary | ICD-10-CM | POA: Diagnosis not present

## 2020-02-05 DIAGNOSIS — C44702 Unspecified malignant neoplasm of skin of right lower limb, including hip: Secondary | ICD-10-CM | POA: Diagnosis not present

## 2020-02-05 DIAGNOSIS — C44509 Unspecified malignant neoplasm of skin of other part of trunk: Secondary | ICD-10-CM | POA: Diagnosis not present

## 2020-02-05 DIAGNOSIS — C761 Malignant neoplasm of thorax: Secondary | ICD-10-CM | POA: Diagnosis not present

## 2020-02-05 DIAGNOSIS — G9340 Encephalopathy, unspecified: Secondary | ICD-10-CM | POA: Diagnosis not present

## 2020-02-06 DIAGNOSIS — G9340 Encephalopathy, unspecified: Secondary | ICD-10-CM | POA: Diagnosis not present

## 2020-02-06 DIAGNOSIS — C44509 Unspecified malignant neoplasm of skin of other part of trunk: Secondary | ICD-10-CM | POA: Diagnosis not present

## 2020-02-06 DIAGNOSIS — L089 Local infection of the skin and subcutaneous tissue, unspecified: Secondary | ICD-10-CM | POA: Diagnosis not present

## 2020-02-06 DIAGNOSIS — C44702 Unspecified malignant neoplasm of skin of right lower limb, including hip: Secondary | ICD-10-CM | POA: Diagnosis not present

## 2020-02-06 DIAGNOSIS — C44602 Unspecified malignant neoplasm of skin of right upper limb, including shoulder: Secondary | ICD-10-CM | POA: Diagnosis not present

## 2020-02-06 DIAGNOSIS — C761 Malignant neoplasm of thorax: Secondary | ICD-10-CM | POA: Diagnosis not present

## 2020-02-07 ENCOUNTER — Telehealth: Payer: Self-pay

## 2020-02-07 NOTE — Telephone Encounter (Signed)
Barnett Applebaum from [facility] states that she sent electronic death certificate for patient [name].

## 2020-03-02 DEATH — deceased
